# Patient Record
Sex: Female | Born: 1945 | Race: White | Hispanic: No | Marital: Single | Smoking: Never smoker
Health system: Southern US, Community
[De-identification: ages and names within clinical notes are randomized; demographics above are authoritative.]

## PROBLEM LIST (undated history)

## (undated) DIAGNOSIS — F329 Major depressive disorder, single episode, unspecified: Secondary | ICD-10-CM

## (undated) DIAGNOSIS — Q2381 Bicuspid aortic valve: Secondary | ICD-10-CM

## (undated) DIAGNOSIS — IMO0001 Reserved for inherently not codable concepts without codable children: Secondary | ICD-10-CM

## (undated) DIAGNOSIS — I350 Nonrheumatic aortic (valve) stenosis: Secondary | ICD-10-CM

## (undated) DIAGNOSIS — R42 Dizziness and giddiness: Secondary | ICD-10-CM

## (undated) DIAGNOSIS — F419 Anxiety disorder, unspecified: Secondary | ICD-10-CM

## (undated) DIAGNOSIS — F32A Depression, unspecified: Secondary | ICD-10-CM

## (undated) DIAGNOSIS — D72819 Decreased white blood cell count, unspecified: Secondary | ICD-10-CM

## (undated) DIAGNOSIS — D649 Anemia, unspecified: Secondary | ICD-10-CM

## (undated) DIAGNOSIS — R011 Cardiac murmur, unspecified: Secondary | ICD-10-CM

## (undated) DIAGNOSIS — R413 Other amnesia: Secondary | ICD-10-CM

## (undated) DIAGNOSIS — IMO0002 Reserved for concepts with insufficient information to code with codable children: Secondary | ICD-10-CM

## (undated) DIAGNOSIS — R569 Unspecified convulsions: Secondary | ICD-10-CM

## (undated) DIAGNOSIS — M109 Gout, unspecified: Secondary | ICD-10-CM

## (undated) DIAGNOSIS — R87619 Unspecified abnormal cytological findings in specimens from cervix uteri: Secondary | ICD-10-CM

## (undated) DIAGNOSIS — I38 Endocarditis, valve unspecified: Secondary | ICD-10-CM

## (undated) DIAGNOSIS — M8580 Other specified disorders of bone density and structure, unspecified site: Secondary | ICD-10-CM

## (undated) DIAGNOSIS — I2510 Atherosclerotic heart disease of native coronary artery without angina pectoris: Secondary | ICD-10-CM

## (undated) DIAGNOSIS — Q231 Congenital insufficiency of aortic valve: Secondary | ICD-10-CM

## (undated) HISTORY — DX: Cardiac murmur, unspecified: R01.1

## (undated) HISTORY — DX: Gout, unspecified: M10.9

## (undated) HISTORY — DX: Nonrheumatic aortic (valve) stenosis: I35.0

## (undated) HISTORY — DX: Anxiety disorder, unspecified: F41.9

## (undated) HISTORY — DX: Other specified disorders of bone density and structure, unspecified site: M85.80

## (undated) HISTORY — DX: Dizziness and giddiness: R42

## (undated) HISTORY — PX: TUBAL LIGATION: SHX77

## (undated) HISTORY — DX: Unspecified convulsions: R56.9

## (undated) HISTORY — PX: VAGINAL HYSTERECTOMY: SUR661

## (undated) HISTORY — PX: GUM SURGERY: SHX658

## (undated) HISTORY — PX: PARTIAL HYSTERECTOMY: SHX80

## (undated) HISTORY — DX: Reserved for concepts with insufficient information to code with codable children: IMO0002

## (undated) HISTORY — PX: CERVICAL CONE BIOPSY: SUR198

## (undated) HISTORY — DX: Endocarditis, valve unspecified: I38

## (undated) HISTORY — DX: Unspecified abnormal cytological findings in specimens from cervix uteri: R87.619

## (undated) HISTORY — DX: Other amnesia: R41.3

---

## 1998-02-28 ENCOUNTER — Other Ambulatory Visit: Admission: RE | Admit: 1998-02-28 | Discharge: 1998-02-28 | Payer: Self-pay | Admitting: *Deleted

## 1998-05-15 ENCOUNTER — Ambulatory Visit (HOSPITAL_COMMUNITY): Admission: RE | Admit: 1998-05-15 | Discharge: 1998-05-15 | Payer: Self-pay | Admitting: Internal Medicine

## 1999-03-21 ENCOUNTER — Other Ambulatory Visit: Admission: RE | Admit: 1999-03-21 | Discharge: 1999-03-21 | Payer: Self-pay | Admitting: Internal Medicine

## 2000-03-25 ENCOUNTER — Other Ambulatory Visit: Admission: RE | Admit: 2000-03-25 | Discharge: 2000-03-25 | Payer: Self-pay | Admitting: Internal Medicine

## 2000-11-01 ENCOUNTER — Ambulatory Visit (HOSPITAL_COMMUNITY): Admission: RE | Admit: 2000-11-01 | Discharge: 2000-11-01 | Payer: Self-pay | Admitting: Gastroenterology

## 2000-11-01 ENCOUNTER — Encounter (INDEPENDENT_AMBULATORY_CARE_PROVIDER_SITE_OTHER): Payer: Self-pay

## 2001-06-20 ENCOUNTER — Encounter: Payer: Self-pay | Admitting: Pulmonary Disease

## 2001-06-20 ENCOUNTER — Ambulatory Visit (HOSPITAL_COMMUNITY): Admission: RE | Admit: 2001-06-20 | Discharge: 2001-06-20 | Payer: Self-pay | Admitting: Pulmonary Disease

## 2001-07-20 ENCOUNTER — Ambulatory Visit (HOSPITAL_COMMUNITY): Admission: RE | Admit: 2001-07-20 | Discharge: 2001-07-20 | Payer: Self-pay | Admitting: Pulmonary Disease

## 2001-07-20 ENCOUNTER — Encounter: Payer: Self-pay | Admitting: Pulmonary Disease

## 2002-11-17 ENCOUNTER — Other Ambulatory Visit: Admission: RE | Admit: 2002-11-17 | Discharge: 2002-11-17 | Payer: Self-pay | Admitting: Obstetrics and Gynecology

## 2003-06-07 ENCOUNTER — Other Ambulatory Visit: Admission: RE | Admit: 2003-06-07 | Discharge: 2003-06-07 | Payer: Self-pay | Admitting: Obstetrics and Gynecology

## 2003-12-20 ENCOUNTER — Other Ambulatory Visit: Admission: RE | Admit: 2003-12-20 | Discharge: 2003-12-20 | Payer: Self-pay | Admitting: Internal Medicine

## 2004-06-09 ENCOUNTER — Other Ambulatory Visit: Admission: RE | Admit: 2004-06-09 | Discharge: 2004-06-09 | Payer: Self-pay | Admitting: *Deleted

## 2005-06-10 ENCOUNTER — Other Ambulatory Visit: Admission: RE | Admit: 2005-06-10 | Discharge: 2005-06-10 | Payer: Self-pay | Admitting: Obstetrics and Gynecology

## 2006-06-11 ENCOUNTER — Other Ambulatory Visit: Admission: RE | Admit: 2006-06-11 | Discharge: 2006-06-11 | Payer: Self-pay | Admitting: Obstetrics & Gynecology

## 2007-03-16 ENCOUNTER — Ambulatory Visit (HOSPITAL_COMMUNITY): Admission: RE | Admit: 2007-03-16 | Discharge: 2007-03-16 | Payer: Self-pay | Admitting: Obstetrics & Gynecology

## 2007-06-14 ENCOUNTER — Ambulatory Visit (HOSPITAL_BASED_OUTPATIENT_CLINIC_OR_DEPARTMENT_OTHER): Admission: RE | Admit: 2007-06-14 | Discharge: 2007-06-14 | Payer: Self-pay | Admitting: Obstetrics & Gynecology

## 2007-06-14 ENCOUNTER — Encounter: Payer: Self-pay | Admitting: Obstetrics & Gynecology

## 2007-12-30 ENCOUNTER — Other Ambulatory Visit: Admission: RE | Admit: 2007-12-30 | Discharge: 2007-12-30 | Payer: Self-pay | Admitting: Obstetrics & Gynecology

## 2008-09-14 HISTORY — PX: COLONOSCOPY: SHX174

## 2009-01-21 ENCOUNTER — Other Ambulatory Visit: Admission: RE | Admit: 2009-01-21 | Discharge: 2009-01-21 | Payer: Self-pay | Admitting: Obstetrics & Gynecology

## 2010-11-03 ENCOUNTER — Other Ambulatory Visit: Payer: Self-pay | Admitting: Oncology

## 2010-11-03 ENCOUNTER — Encounter (HOSPITAL_BASED_OUTPATIENT_CLINIC_OR_DEPARTMENT_OTHER): Payer: BC Managed Care – PPO | Admitting: Oncology

## 2010-11-03 DIAGNOSIS — D72819 Decreased white blood cell count, unspecified: Secondary | ICD-10-CM

## 2010-11-03 LAB — COMPREHENSIVE METABOLIC PANEL
ALT: 20 U/L (ref 0–35)
AST: 32 U/L (ref 0–37)
Albumin: 3.9 g/dL (ref 3.5–5.2)
Alkaline Phosphatase: 82 U/L (ref 39–117)
BUN: 12 mg/dL (ref 6–23)
CO2: 29 mEq/L (ref 19–32)
Calcium: 9.1 mg/dL (ref 8.4–10.5)
Chloride: 102 mEq/L (ref 96–112)
Creatinine, Ser: 0.71 mg/dL (ref 0.40–1.20)
Glucose, Bld: 84 mg/dL (ref 70–99)
Potassium: 3.7 mEq/L (ref 3.5–5.3)
Sodium: 139 mEq/L (ref 135–145)
Total Bilirubin: 0.5 mg/dL (ref 0.3–1.2)
Total Protein: 7.4 g/dL (ref 6.0–8.3)

## 2010-11-03 LAB — CBC WITH DIFFERENTIAL/PLATELET
BASO%: 0.6 % (ref 0.0–2.0)
Basophils Absolute: 0 10*3/uL (ref 0.0–0.1)
EOS%: 1.2 % (ref 0.0–7.0)
Eosinophils Absolute: 0 10*3/uL (ref 0.0–0.5)
HCT: 36 % (ref 34.8–46.6)
HGB: 12.3 g/dL (ref 11.6–15.9)
LYMPH%: 34.8 % (ref 14.0–49.7)
MCH: 32.2 pg (ref 25.1–34.0)
MCHC: 34.1 g/dL (ref 31.5–36.0)
MCV: 94.2 fL (ref 79.5–101.0)
MONO#: 0.3 10*3/uL (ref 0.1–0.9)
MONO%: 7.6 % (ref 0.0–14.0)
NEUT#: 2.1 10*3/uL (ref 1.5–6.5)
NEUT%: 55.8 % (ref 38.4–76.8)
Platelets: 179 10*3/uL (ref 145–400)
RBC: 3.83 10*6/uL (ref 3.70–5.45)
RDW: 12.5 % (ref 11.2–14.5)
WBC: 3.8 10*3/uL — ABNORMAL LOW (ref 3.9–10.3)
lymph#: 1.3 10*3/uL (ref 0.9–3.3)

## 2010-11-03 LAB — CHCC SMEAR

## 2010-11-03 LAB — MORPHOLOGY
PLT EST: ADEQUATE
RBC Comments: NORMAL

## 2010-11-04 LAB — ANA: Anti Nuclear Antibody(ANA): NEGATIVE

## 2010-11-04 LAB — HIV ANTIBODY (ROUTINE TESTING W REFLEX): HIV: NONREACTIVE

## 2011-01-27 NOTE — Op Note (Signed)
Nicole Hess, Nicole Hess              ACCOUNT NO.:  [id number]   MEDICAL RECORD NO.:  [id number]          PATIENT TYPE:  AMB   LOCATION:  [facility]                         FACILITY:  [facility]   PHYSICIAN:  M. Leda Quail, MD  DATE OF BIRTH:  23-Feb-1946   DATE OF PROCEDURE:  DATE OF DISCHARGE:                               OPERATIVE REPORT   PREOPERATIVE DIAGNOSES:  29. 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  2. Left lower quadrant pain.  3. History of seizure disorder.  4. 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   POSTOPERATIVE DIAGNOSES:  6. 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  2. Left lower quadrant pain.  3. History of seizure disorder.  4. 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  5. Dense and filmy adhesions of ovaries and fallopian tubes      bilaterally.  6. Laparoscopic bilateral salpingo-oophorectomy with lysis of      adhesions for approximately 45 minutes.   SURGEON:  M. Leda Quail, MD   ASSISTANT:  Edwena Felty. Romine, M.D.   ANESTHESIA:  General.   FINDINGS:  Bilateral dilated fallopian tubes, consistent with  hydrosalpinx, atrophic ovaries, and dense and filmy adhesions of the  tubes and ovaries, and the pelvic sidewalls bilaterally.   SPECIMENS:  Bilateral tubes and ovaries.   DISPOSITION:  Specimen to pathology.   ESTIMATED BLOOD LOSS:  50 mL.   FLUIDS:  2000 mL of LR.   URINE OUTPUT:  4475 mL.   DRAINS:  Foley catheter.   INDICATIONS:  Nicole Hess  is a very nice 65 year old white  female who has a history of bilateral hydrosalpinx.  This was noted on  CAT scan which was initially performed due to unexplained weight loss.  The patient underwent a pelvic ultrasound which showed 5 cm hydrosalpinx  bilaterally.  CA-125 was negative, and we  decided to follow this  conservatively.  In the interim she has developed some persistent left  lower quadrant pain.  Repeat ultrasound showed a 10-cm and 9 cm  hydrosalpinx.  Because they were enlarging in size, and she was now  having some pain issues we decided to go ahead and proceed with  laparoscopic BSO.  The patient has been consented for this procedure.  The risks and benefits have explained in the clinic.  These were all  documented on her chart.  The patient was ready to proceed.   DESCRIPTION OF PROCEDURE:  The patient taken to the operating room.  She  is placed in the supine position.  General endotracheal anesthesia is  administered by the anesthesia staff without difficulty.  Legs  positioned in the Alma stirrups in the low lithotomy position.  Abdomen, perineum, inner thighs, and vagina are prepped in normal  sterile fashion.  A Foley catheter was inserted in the bladder under  sterile conditions.  The patient is then draped in a normal sterile  fashion.  A sponge stick was placed in the vagina.   Attention is turned to the umbilicus.  Then 5 mL of 1/4% Marcaine  without epinephrine are instilled in the umbilicus.  A 10-mm skin  incision is made with the #11 blade.  The subcu fat tissue is dissected  with the hemostat.  The sacral promontory was palpated.  The abdominal  wall layer is elevated, and the Veress needle was aimed toward the  pelvis and placed through the abdominal wall layers.  Using a syringe,  no aspiration of fluid was noted.  Fluid dripped easily in and no  aspiration of fluid or blood was noted with a second aspiration.  A drip  test showed fluid dripping easily into the Veress needle.   Then the Veress needle was attached to a CO2 gas under low a flow.  What  was felt to be a pneumoperitoneum was achieved with low pressures all  under 15 mmHg.  Two liters of gas was instilled intraperitoneally.  Then  a nonbladed trocar with OptiVu tip was obtained.   Using a twisting  motion, the abdominal wall layers were traversed without difficulty.  Unfortunately, I could not pierce through the peritoneum with the OptiVu  tip even with it hubbed at the patient's abdomen.  This was removed and  an attempt was made, again, to pass a Veress needle.  By this point, all  the gas had escaped from the abdomen.  This was felt, again, to be in  its appropriate place, and a pneumoperitoneum, again, was achieved with  low pressures.   A second attempt was made to pass the midline trocar with the OptiVu  port.  The peritoneal layer, again, could not be traversed.  Decision  was made to go ahead, and proceed with this procedure using an open  technique.  The fascia was grasped on either side with Kocher clamps  that had already been traversed, and so it was visible.  The peritoneum  was then identified, elevated, and incised sharply.  The gas escaped  easily which did confirm that we had a pneumoperitoneum, but because of  the OptiVu port, and the nonbladed trocar in the midline, I could not  pass the tip through the peritoneum.   A Roseanne Reno was obtained and passed through this opening after the fascia  was stitched on either side.  The stitches were attached to the [place];  CO2 gas was then attached to the Campbell, and a pneumoperitoneum was  easily achieved.  The patient was placed in Trendelenburg, and  laparoscope was used to visualize the pelvis.  Bilateral hydrosalpinx  were noted; however, they were adhered to the vaginal cuff, and somewhat  to the sidewalls through dense and filmy adhesions.  Ureters were  identified bilaterally.   Attention was first turned to the left side.  The left hydrosalpinx was  freed using sharp dissection from the sidewall.  Cautery was used as  necessary.  At least 30 minutes was necessary to free the left fallopian  tube from the sidewall.  Again, care was taken to note the location of  the ureter throughout this process.   Once the ovary and fallopian tube  had been freed from the sidewall and then to the vagina, two Endoloops  were placed around the IP ligament on the left side.  These were cinched  tightly and cut.  Then the left fallopian tube and ovary were freed from  the IP ligament.  These were placed in  the pelvis.  No bleeding was  noted on this side.   Attention was turned to the right side.  In a similar fashion the left  fallopian tube was freed from the pelvic sidewall using sharp dissection  as necessary.  Bipolar cautery was used as necessary as well to achieve  hemostasis through the dense adhesions.  About 15-20 minutes was  necessary to free this side from the pelvic sidewall.  Once this had  occurred, two Endoloops were placed around the right IP ligament.  These  Endoloops were cinched tightly.  In placing the second Endoloop a piece  of bowel epiploica was caught in the loop.  This could not be freed  without fear of tearing the bowel.  Therefore, the loop was cut, and the  knot was left on the epiploica.  This section of bowel was visualized  multiple times to ensure that actually no bowel tissue was obtained into  the loop and this was confirmed.   A second Endoloop was then placed on the right IP ligament and cinched  tightly.  This pedicle was then transected, and the right fallopian tube  and ovary were freed up.  An attempt was made to place all specimens in  an endoscopic bag, but was unsuccessful.  Therefore, the hydrosalpinx  were drained by incising them and then pulling all specimens out through  the midline port.  Once this was performed, then a Nezhat suction  irrigator was used to irrigate the IP ligaments bilaterally.  There was  a small amount of bleeding from the right pedicle or an area where the  vessels had come out of the pedicle.  The ureter was well identified.  Using bipolar cautery, this area of bleeding was grasped and cauterized  until the indicator  indicated appropriate cautery.   This side was watched for several minutes to ensure that there was no  hematoma formation and to ensure excellent hemostasis.  There was a  small amount of bleeding on the left peritoneal edge, on the pelvic  sidewall.  This was grasped with bipolar cautery pulled way from the  sidewall, and cauterized.  Then the Nezhat suction irrigator, again, was  used to ensure no bleeding was noted and to irrigate both the sidewalls.   At this point, we were comfortable with no bleeding.  The patient was  taken out the Trendelenburg slightly, and tipped to the left side so  that the right side could be well visualized, particularly the right IP  ligament once the pneumoperitoneum was released.  The pneumoperitoneum  was released slowly, and no bleeding was noted.  Pneumoperitoneum was  achieved, again, to revisualize that side; and, again, no hematoma  formation was noted, and excellent hemostasis was present.  At this  point the procedure and was ended.  Right and left lower quadrant ports  were removed under direct visualization of the laparoscope.  The  laparoscope was then removed, and the pneumoperitoneum was completely  relieved.  Anesthesia gave the patient several deep breaths and an  attempt was made to remove all the gas from the abdomen.   At this point the Roseanne Reno was removed.  The fascial stitches were tied  tightly to bring the fascia back together in the midline.  The  operator's index finger was placed in this incision to ensure no bowel  was in the midline incision before the sutures were tied.  The skin was  then closed in the midline with a subcuticular  stitch of 4-0 Vicryl, and  the two inferior incisions were closed with Dermabond.  The midline  incision was cleansed, dried, and also made air tight with Dermabond.   Sponge, lap, needle, and instrument counts were correct x2.  The sponge  stick was taken out of the vagina, and the Foley was  removed without  difficulty.  The legs were positioned back in the supine position.  The  patient was awakened from anesthesia without difficulty.  She was taken  to the recovery room in stable condition.      Lum Keas, MD  Electronically Signed     MSM/MEDQ  D:  06/14/2007  T:  06/14/2007  Job:  [id number]

## 2011-03-02 ENCOUNTER — Encounter (HOSPITAL_BASED_OUTPATIENT_CLINIC_OR_DEPARTMENT_OTHER): Payer: BC Managed Care – PPO | Admitting: Oncology

## 2011-03-02 DIAGNOSIS — D72819 Decreased white blood cell count, unspecified: Secondary | ICD-10-CM

## 2011-03-04 ENCOUNTER — Encounter: Payer: BC Managed Care – PPO | Admitting: Oncology

## 2011-03-04 ENCOUNTER — Other Ambulatory Visit: Payer: Self-pay | Admitting: Oncology

## 2011-03-04 LAB — CBC WITH DIFFERENTIAL/PLATELET
BASO%: 0.6 % (ref 0.0–2.0)
EOS%: 1.6 % (ref 0.0–7.0)
HGB: 13 g/dL (ref 11.6–15.9)
MCH: 32.4 pg (ref 25.1–34.0)
MCHC: 34.5 g/dL (ref 31.5–36.0)
MONO%: 9.1 % (ref 0.0–14.0)
RBC: 4.03 10*6/uL (ref 3.70–5.45)
RDW: 12.8 % (ref 11.2–14.5)
lymph#: 1.6 10*3/uL (ref 0.9–3.3)

## 2011-06-25 LAB — COMPREHENSIVE METABOLIC PANEL
ALT: 26
AST: 38 — ABNORMAL HIGH
CO2: 30
Chloride: 102
GFR calc Af Amer: >60
GFR calc non Af Amer: >60
Glucose, Bld: 75
Sodium: 142
Total Bilirubin: 0.4

## 2011-06-25 LAB — URINALYSIS, ROUTINE W REFLEX MICROSCOPIC
Bilirubin Urine: NEGATIVE
Ketones, ur: NEGATIVE
Nitrite: NEGATIVE
Protein, ur: NEGATIVE
Urobilinogen, UA: 0.2

## 2011-06-25 LAB — CBC
Hemoglobin: 12.5
MCV: 90.5
RBC: 4.07
WBC: 3.5 — ABNORMAL LOW

## 2011-07-01 ENCOUNTER — Other Ambulatory Visit: Payer: Self-pay | Admitting: Oncology

## 2011-07-01 ENCOUNTER — Encounter (HOSPITAL_BASED_OUTPATIENT_CLINIC_OR_DEPARTMENT_OTHER): Payer: BC Managed Care – PPO | Admitting: Oncology

## 2011-07-01 DIAGNOSIS — D72819 Decreased white blood cell count, unspecified: Secondary | ICD-10-CM

## 2011-07-01 LAB — CBC WITH DIFFERENTIAL/PLATELET
Basophils Absolute: 0 10*3/uL (ref 0.0–0.1)
EOS%: 1.9 % (ref 0.0–7.0)
HCT: 38 % (ref 34.8–46.6)
HGB: 13 g/dL (ref 11.6–15.9)
MCH: 31.8 pg (ref 25.1–34.0)
MCV: 92.9 fL (ref 79.5–101.0)
NEUT%: 49 % (ref 38.4–76.8)
Platelets: 148 10*3/uL (ref 145–400)
lymph#: 1.3 10*3/uL (ref 0.9–3.3)

## 2011-07-01 LAB — COMPREHENSIVE METABOLIC PANEL
ALT: 25 U/L (ref 0–35)
Alkaline Phosphatase: 95 U/L (ref 39–117)
Creatinine, Ser: 0.8 mg/dL (ref 0.50–1.10)
Sodium: 140 mEq/L (ref 135–145)
Total Bilirubin: 0.3 mg/dL (ref 0.3–1.2)
Total Protein: 7.4 g/dL (ref 6.0–8.3)

## 2011-07-01 LAB — MORPHOLOGY: RBC Comments: NORMAL

## 2011-07-01 LAB — CHCC SMEAR

## 2011-08-07 ENCOUNTER — Telehealth: Payer: Self-pay | Admitting: *Deleted

## 2011-08-07 NOTE — Telephone Encounter (Signed)
Pt called for advice on cold symptoms, c/o congestion and asks "what should I do?"   Instructed pt to call PCP if she develops any fever or productive cough or symptoms persist.  Suggested OTC cold remedies for symptom management and directed her again to her PCP if any worsening or persistent symptoms..  Pt states right now feels like it might be "allergies."  She verbalized understanding.

## 2011-08-27 ENCOUNTER — Telehealth: Payer: Self-pay | Admitting: *Deleted

## 2011-08-27 ENCOUNTER — Other Ambulatory Visit: Payer: Self-pay | Admitting: *Deleted

## 2011-08-27 NOTE — Telephone Encounter (Signed)
Pt called to schedule her pending lab appts for 09/28/11 and 12/28/11 at 8 am.  Sent POF to scheduling and instructed her scheduling should be calling her to confirm d/t of labs.  She verbalized understanding.

## 2011-08-31 ENCOUNTER — Telehealth: Payer: Self-pay | Admitting: Oncology

## 2011-08-31 NOTE — Telephone Encounter (Signed)
lmonvm advising the pt of her lab appt in jan and to pick up her April 2013 appt calendar at that time

## 2011-09-02 ENCOUNTER — Other Ambulatory Visit: Payer: Self-pay | Admitting: *Deleted

## 2011-09-02 DIAGNOSIS — D72819 Decreased white blood cell count, unspecified: Secondary | ICD-10-CM

## 2011-09-28 ENCOUNTER — Other Ambulatory Visit (HOSPITAL_BASED_OUTPATIENT_CLINIC_OR_DEPARTMENT_OTHER): Payer: BC Managed Care – PPO | Admitting: Lab

## 2011-09-28 ENCOUNTER — Telehealth: Payer: Self-pay | Admitting: *Deleted

## 2011-09-28 ENCOUNTER — Other Ambulatory Visit: Payer: Self-pay | Admitting: Oncology

## 2011-09-28 ENCOUNTER — Other Ambulatory Visit: Payer: Self-pay | Admitting: *Deleted

## 2011-09-28 DIAGNOSIS — D72819 Decreased white blood cell count, unspecified: Secondary | ICD-10-CM

## 2011-09-28 LAB — CBC WITH DIFFERENTIAL/PLATELET
EOS%: 1.4 % (ref 0.0–7.0)
Eosinophils Absolute: 0.1 10*3/uL (ref 0.0–0.5)
HGB: 12.5 g/dL (ref 11.6–15.9)
MCV: 93.4 fL (ref 79.5–101.0)
MONO%: 10.5 % (ref 0.0–14.0)
NEUT#: 2.2 10*3/uL (ref 1.5–6.5)
RBC: 3.93 10*6/uL (ref 3.70–5.45)
RDW: 13.8 % (ref 11.2–14.5)
lymph#: 1.3 10*3/uL (ref 0.9–3.3)

## 2011-09-28 LAB — CHCC SMEAR

## 2011-09-28 NOTE — Telephone Encounter (Signed)
Message copied by Wende Mott on Mon Sep 28, 2011  3:55 PM ------      Message from: HA, Raliegh Ip T      Created: Mon Sep 28, 2011  9:00 AM       Please call patient.  Her CBC is relatively stable.  Her plt is only slightly low.  Will continue observation, and monitor her CBC for now.  Thanks.

## 2011-09-28 NOTE — Telephone Encounter (Signed)
Message copied by Wende Mott on Mon Sep 28, 2011 11:05 AM ------      Message from: HA, Raliegh Ip T      Created: Mon Sep 28, 2011  9:00 AM       Please call patient.  Her CBC is relatively stable.  Her plt is only slightly low.  Will continue observation, and monitor her CBC for now.  Thanks.

## 2011-09-28 NOTE — Telephone Encounter (Signed)
Called pt and relayed Dr. Lodema Pilot message that her CBC is relatively stable, w/ slightly low platelets.  Instructed to keep next lab appt in April as scheduled. Pt verbalized understanding.

## 2011-12-28 ENCOUNTER — Other Ambulatory Visit: Payer: BC Managed Care – PPO | Admitting: Lab

## 2011-12-28 ENCOUNTER — Other Ambulatory Visit: Payer: Self-pay | Admitting: Oncology

## 2011-12-28 DIAGNOSIS — D72819 Decreased white blood cell count, unspecified: Secondary | ICD-10-CM

## 2011-12-28 LAB — CBC WITH DIFFERENTIAL/PLATELET
BASO%: 0.5 % (ref 0.0–2.0)
Eosinophils Absolute: 0.1 10*3/uL (ref 0.0–0.5)
HCT: 39.7 % (ref 34.8–46.6)
MCHC: 33.6 g/dL (ref 31.5–36.0)
MONO#: 0.4 10*3/uL (ref 0.1–0.9)
NEUT#: 1.6 10*3/uL (ref 1.5–6.5)
RBC: 4.14 10*6/uL (ref 3.70–5.45)
WBC: 3.3 10*3/uL — ABNORMAL LOW (ref 3.9–10.3)
lymph#: 1.3 10*3/uL (ref 0.9–3.3)
nRBC: 0 % (ref 0–0)

## 2012-02-07 ENCOUNTER — Encounter (HOSPITAL_COMMUNITY): Payer: Self-pay | Admitting: Emergency Medicine

## 2012-02-07 ENCOUNTER — Encounter: Payer: Self-pay | Admitting: *Deleted

## 2012-02-07 ENCOUNTER — Emergency Department (HOSPITAL_COMMUNITY)
Admission: EM | Admit: 2012-02-07 | Discharge: 2012-02-07 | Disposition: A | Discharge status: Home / Self Care | Payer: BC Managed Care – PPO | Attending: Emergency Medicine | Admitting: Emergency Medicine

## 2012-02-07 ENCOUNTER — Emergency Department (HOSPITAL_COMMUNITY): Payer: BC Managed Care – PPO

## 2012-02-07 DIAGNOSIS — S99929A Unspecified injury of unspecified foot, initial encounter: Secondary | ICD-10-CM | POA: Insufficient documentation

## 2012-02-07 DIAGNOSIS — M25569 Pain in unspecified knee: Secondary | ICD-10-CM | POA: Insufficient documentation

## 2012-02-07 DIAGNOSIS — W010XXA Fall on same level from slipping, tripping and stumbling without subsequent striking against object, initial encounter: Secondary | ICD-10-CM | POA: Insufficient documentation

## 2012-02-07 DIAGNOSIS — M25469 Effusion, unspecified knee: Secondary | ICD-10-CM | POA: Insufficient documentation

## 2012-02-07 DIAGNOSIS — S8000XA Contusion of unspecified knee, initial encounter: Secondary | ICD-10-CM | POA: Insufficient documentation

## 2012-02-07 DIAGNOSIS — S8990XA Unspecified injury of unspecified lower leg, initial encounter: Secondary | ICD-10-CM | POA: Insufficient documentation

## 2012-02-07 DIAGNOSIS — Z79899 Other long term (current) drug therapy: Secondary | ICD-10-CM | POA: Insufficient documentation

## 2012-02-07 DIAGNOSIS — W19XXXA Unspecified fall, initial encounter: Secondary | ICD-10-CM

## 2012-02-07 DIAGNOSIS — S8992XA Unspecified injury of left lower leg, initial encounter: Secondary | ICD-10-CM

## 2012-02-07 MED ORDER — TRAMADOL HCL 50 MG PO TABS: 50.0000 mg | ORAL_TABLET | Freq: Four times a day (QID) | ORAL | Status: AC | PRN

## 2012-02-07 NOTE — Discharge Instructions (Signed)
You have been evaluated for your knee injury. X-ray shows no acute fractures or dislocation. There is moderate swelling noted to your left knee. Use knee sleeves for support. Use crutches as needed to move around for the next 5-7 days. Follows instructions below. Do not drive or operate heavy machinery while taking pain medication.  Knee Wraps (Elastic Bandage) and RICE Knee wraps come in many different shapes and sizes and perform many different functions. Some wraps may provide cold therapy or warmth. Your caregiver will help you to determine what is best for your protection, or recovery following your injury. The following are some general tips to help you use a knee wrap:  Use the wrap as directed.   Do not keep the wrap so tight that it cuts off the circulation of the leg below the wrap.   If your lower leg becomes blue, loses feeling, or becomes swollen below the wrap, it is probably too tight. Loosen the wrap as needed to improve these problems.   See your caregiver or trainer if the wrap seems to be making your problems worse rather than better.  Wraps in general help to remind you that you have an injury. They provide limited support. The few pounds of support they provide are minimal considering the hundreds of pounds of pressure it takes to injure a joint or tear ligaments.  The routine care of many injuries includes Rest, Ice, Compression, and Elevation (RICE).  Rest is required to allow your body to heal. Generally following bumps and bruises, routine activities can be resumed when comfortable. Injured tendons (cord-like structures that attach muscle to bone) and bones take approximately 6 to 12 weeks to heal.   Ice following an injury helps keep the swelling down and reduces pain. Do not apply ice directly to skin. Apply ice bags for 20-30 minutes every 3-4 hours for the first 2-3 days following injury or surgery. Place ice in a plastic bag with a towel around it.   Compression helps  keep swelling down, gives support, and helps with discomfort. If a knee wrap has been applied, it should be removed and reapplied every 3 to 4 hours. It should be applied firmly enough to keep swelling down, but not too tightly. Watch your lower leg and toes for swelling, bluish discoloration, coldness, numbness or excessive pain. If any of these symptoms (problems) occur, remove the knee wrap and reapply more loosely. If these symptoms persist, contact your caregiver immediately.   Elevation helps reduce swelling, and decreases pain. With extremities (arms/hands and legs/feet), the injured area should be placed near to or above the level of the heart if possible.  Persistent pain and inability to use the injured area for more than 2 to 3 days are warning signs indicating that you should see a caregiver for a follow-up visit as soon as possible. Initially, a hairline fracture (this is the same as a broken bone) may not be seen on x-rays.  Persistent pain and swelling mean limitedweight bearing (use of crutches as instructed) should continue. You may need further x-rays.  X-rays may not show a non-displaced fracture until a week or ten days later. Make a follow-up appointment with your caregiver. A radiologist (a specialist in reading x-rays) will re-read your x-rays. Make sure you know how to get your x-ray results. Do not assume everything is normal if you do not hear from your caregiver. MAKE SURE YOU:   Understand these instructions.   Will watch your condition.  Will get help right away if you are not doing well or get worse.  Document Released: 02/20/2002 Document Revised: 08/20/2011 Document Reviewed: 12/20/2008 [facility] Patient Information 2012 [place], [place].  Knee Effusion The medical term for having fluid in your knee is effusion. This is often due to an internal derangement of the knee. This means something is wrong inside the knee. Some of the causes of fluid in the knee may be torn  cartilage, a torn ligament, or bleeding into the joint from an injury. Your knee is likely more difficult to bend and move. This is often because there is increased pain and pressure in the joint. The time it takes for recovery from a knee effusion depends on different factors, including:   Type of injury.   Your age.   Physical and medical conditions.   Rehabilitation Strategies.  How long you will be away from your normal activities will depend on what kind of knee problem you have and how much damage is present. Your knee has two types of cartilage. Articular cartilage covers the bone ends and lets your knee bend and move smoothly. Two menisci, thick pads of cartilage that form a rim inside the joint, help absorb shock and stabilize your knee. Ligaments bind the bones together and support your knee joint. Muscles move the joint, help support your knee, and take stress off the joint itself. CAUSES  Often an effusion in the knee is caused by an injury to one of the menisci. This is often a tear in the cartilage. Recovery after a meniscus injury depends on how much meniscus is damaged and whether you have damaged other knee tissue. Small tears may heal on their own with conservative treatment. Conservative means rest, limited weight bearing activity and muscle strengthening exercises. Your recovery may take up to 6 weeks.  TREATMENT  Larger tears may require surgery. Meniscus injuries may be treated during arthroscopy. Arthroscopy is a procedure in which your surgeon uses a small telescope like instrument to look in your knee. Your caregiver can make a more accurate diagnosis (learning what is wrong) by performing an arthroscopic procedure. If your injury is on the inner margin of the meniscus, your surgeon may trim the meniscus back to a smooth rim. In other cases your surgeon will try to repair a damaged meniscus with stitches (sutures). This may make rehabilitation take longer, but may provide  better long term result by helping your knee keep its shock absorption capabilities. Ligaments which are completely torn usually require surgery for repair. HOME CARE INSTRUCTIONS  Use crutches as instructed.   If a brace is applied, use as directed.   Once you are home, an ice pack applied to your swollen knee may help with discomfort and help decrease swelling.   Keep your knee raised (elevated) when you are not up and around or on crutches.   Only take over-the-counter or prescription medicines for pain, discomfort, or fever as directed by your caregiver.   Your caregivers will help with instructions for rehabilitation of your knee. This often includes strengthening exercises.   You may resume a normal diet and activities as directed.  SEEK MEDICAL CARE IF:   There is increased swelling in your knee.   You notice redness, swelling, or increasing pain in your knee.   An unexplained oral temperature above 102 F (38.9 C) develops.  SEEK IMMEDIATE MEDICAL CARE IF:   You develop a rash.   You have difficulty breathing.   You have any  allergic reactions from medications you may have been given.   There is severe pain with any motion of the knee.  MAKE SURE YOU:   Understand these instructions.   Will watch your condition.   Will get help right away if you are not doing well or get worse.  Document Released: 11/21/2003 Document Revised: 08/20/2011 Document Reviewed: 01/25/2008 [facility] Patient Information 2012 [place], [place].

## 2012-02-07 NOTE — ED Provider Notes (Signed)
History     CSN: [id number]  Arrival date & time 02/07/12  1749   First MD Initiated Contact with Patient 02/07/12 1941      Chief Complaint  Patient presents with  . Knee Pain    (Consider location/radiation/quality/duration/timing/severity/associated sxs/prior treatment) HPI  66 year old female presents with a chief complaints of fall. The [place]pped over an uneven surface and fell forward.  Patient states both of knees made contact with the ground.  She denies hitting head or LOC.  Denies any other injury.  Able to ambulate afterward but sts pain is increasing L>R.  Notice increase swelling to L knee. Describe as throbbing and burning, worsening with movement. Denies taking anticoagulant.  UTD with tetanus.  Denies precipitating sxs prior to fall.    Past Medical History  Diagnosis Date  . Seizures     as a child  . Mitral insufficiency   . Heart murmur     Past Surgical History  Procedure Date  . Oophorectomy   . Partial hysterectomy   . Gum surgery     Family History  Problem Relation Age of Onset  . Multiple myeloma Mother   . Stroke Father   . Hypertension Father   . Heart disease Father   . Kidney disease Father   . Cancer Father     History  Substance Use Topics  . Smoking status: Never Smoker   . Smokeless tobacco: Not on file  . Alcohol Use: 4.2 oz/week    7 Glasses of wine per week    OB History    Grav Para Term Preterm Abortions TAB SAB Ect Mult Living                  Review of Systems  Constitutional: Negative for fever and fatigue.  Musculoskeletal: Negative for gait problem.  Neurological: Negative for numbness.    Allergies  Penicillins  Home Medications   Current Outpatient Rx  Name Route Sig Dispense Refill  . CALTRATE 600+D PO Oral Take 1 tablet by mouth 2 (two) times daily.    Marland Kitchen VIVELLE-DOT TD Transdermal Place 0.5 patches onto the skin 2 (two) times a week.    .  ADULT MULTIVITAMIN W/MINERALS CH Oral Take 1 tablet by mouth daily.    Marland Kitchen PRAVASTATIN SODIUM 80 MG PO TABS Oral Take 80 mg by mouth daily.    Marland Kitchen PRIMIDONE 250 MG PO TABS Oral Take 250 mg by mouth 2 (two) times daily.    Marland Kitchen TEMAZEPAM 30 MG PO CAPS Oral Take 30 mg by mouth at bedtime as needed. For sleep    . VITAMIN B-12 50 MCG PO TABS Oral Take 50 mcg by mouth daily.      BP 125/60  Pulse 85  Temp(Src) 98.6 F (37 C) (Oral)  SpO2 100%  Physical Exam  Nursing note and vitals reviewed. Constitutional: She appears well-developed and well-nourished. No distress.  HENT:  Head: Normocephalic and atraumatic.  Eyes: Conjunctivae are normal.  Neck: Normal range of motion. Neck supple.  Musculoskeletal:       Right hip: Normal.       Left hip: Normal.       Right knee: She exhibits swelling and ecchymosis. She exhibits normal range of motion, no effusion, no deformity and no laceration.       Left knee: She exhibits decreased range of motion, swelling, effusion and ecchymosis. She exhibits no deformity, no laceration and normal  alignment. tenderness found.       Right ankle: Normal.       Left ankle: Normal.       Legs: Neurological: She is alert.  Skin: Skin is warm.    ED Course  Procedures (including critical care time)  Labs Reviewed - No data to display No results found.   No diagnosis found.  Dg Knee Complete 4 Views Left  02/07/2012  *RADIOLOGY REPORT*  Clinical Data: Fall  LEFT KNEE - COMPLETE 4+ VIEW  Comparison: None.  Findings: No acute fracture and no dislocation.  Osteopenia. Moderate joint effusion.  Mild narrowing of the medial compartment.  IMPRESSION: No acute bony pathology.  Mild degenerative change.  Osteopenia. Moderate joint effusion.  Original Report Authenticated By: Donavan Burnet, M.D.   Dg Knee Complete 4 Views Right  02/07/2012  *RADIOLOGY REPORT*  Clinical Data: Injury  RIGHT KNEE - COMPLETE 4+ VIEW  Comparison: None.  Findings: No acute fracture and no  dislocation.  Osteopenia. Minimal degenerative change.  IMPRESSION: No acute bony pathology.  Original Report Authenticated By: Donavan Burnet, M.D.      MDM  Mechanical fall causing bilateral knee injury.  Abrasion noted to anterior aspect of both knee.  Moderate edema noted to L knee.  Decreased ROM due to pain.  Able to ambulate.  WIll xray both knees.     9:03 PM Xray shows no acute fracture or dislocation. Moderate knee effusion noted to left knee. Patient has abrasion to both knees. Bacitracin and dressing applied. The knee sleeves applied. Crutches given with instruction. Will DC with care instructions discussed. Patient voiced understanding and agrees with plan.     Fayrene Helper, PA-C 02/07/12 2104

## 2012-02-13 NOTE — ED Provider Notes (Signed)
Medical screening examination/treatment/procedure(s) were performed by non-physician practitioner and as supervising physician I was immediately available for consultation/collaboration.  Raeford Razor, MD 02/13/12 [phone]

## 2012-03-30 ENCOUNTER — Other Ambulatory Visit (HOSPITAL_BASED_OUTPATIENT_CLINIC_OR_DEPARTMENT_OTHER): Payer: BC Managed Care – PPO | Admitting: Lab

## 2012-03-30 ENCOUNTER — Ambulatory Visit (HOSPITAL_BASED_OUTPATIENT_CLINIC_OR_DEPARTMENT_OTHER): Payer: BC Managed Care – PPO | Admitting: Oncology

## 2012-03-30 VITALS — BP 129/71 | HR 78 | Ht 68.0 in | Wt 156.3 lb

## 2012-03-30 DIAGNOSIS — E785 Hyperlipidemia, unspecified: Secondary | ICD-10-CM | POA: Insufficient documentation

## 2012-03-30 DIAGNOSIS — D72819 Decreased white blood cell count, unspecified: Secondary | ICD-10-CM | POA: Insufficient documentation

## 2012-03-30 DIAGNOSIS — D696 Thrombocytopenia, unspecified: Secondary | ICD-10-CM

## 2012-03-30 DIAGNOSIS — F329 Major depressive disorder, single episode, unspecified: Secondary | ICD-10-CM

## 2012-03-30 LAB — COMPREHENSIVE METABOLIC PANEL
ALT: 24 U/L (ref 0–35)
BUN: 23 mg/dL (ref 6–23)
CO2: 26 mEq/L (ref 19–32)
Calcium: 9 mg/dL (ref 8.4–10.5)
Chloride: 103 mEq/L (ref 96–112)
Creatinine, Ser: 0.91 mg/dL (ref 0.50–1.10)

## 2012-03-30 LAB — CBC WITH DIFFERENTIAL/PLATELET
BASO%: 0.6 % (ref 0.0–2.0)
LYMPH%: 44.7 % (ref 14.0–49.7)
MCHC: 33.5 g/dL (ref 31.5–36.0)
MCV: 94.8 fL (ref 79.5–101.0)
MONO%: 7.7 % (ref 0.0–14.0)
Platelets: 165 10*3/uL (ref 145–400)
RBC: 4.01 10*6/uL (ref 3.70–5.45)

## 2012-03-30 LAB — CHCC SMEAR

## 2012-03-30 NOTE — Patient Instructions (Addendum)
1.  Issue:  Low white count (leukopenia) but normal neutrophil (bacteria-fighting white blood cells).  Most likely benign.  This has been ongoing since 05/2007.  I have very low suspicion for leukepenia, or bone marrow failure at this time.   2.  Recommendation:  Discharged from [facility].  Please follow up with your primary care physician.  Please have your blood count checked about twice a year.  In the future, if your neutrophil <1; Hemoglobin <10; or Platelet <100, we can further evaluate again.      Thank you.

## 2012-03-30 NOTE — Progress Notes (Signed)
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   CURRENT THERAPY:  Watchful observation.   INTERVAL HISTORY: Nicole Hess 66 y.o. female returns for regular follow up.  She reports feeling well.  Her diet now has been more diversified compared to before.  She still works full time and works out regularly.  She has good appetite and stable weight.  She denied fever, night sweat, fatigue, focal source of infection, bleeding, skin rash, bone pain.    Past Medical History  Diagnosis Date  . Seizures     as a child  . Mitral insufficiency   . Heart murmur     Past Surgical History  Procedure Date  . Oophorectomy   . Partial hysterectomy   . Gum surgery     Current Outpatient Prescriptions  Medication Sig Dispense Refill  . b complex vitamins tablet Take 1 tablet by mouth daily.      . Calcium Carbonate-Vitamin D (CALTRATE 600+D PO) Take 1 tablet by mouth 2 (two) times daily.      . Echinacea 125 MG CAPS Take by mouth. 1 tablet daily      . Estradiol (VIVELLE-DOT TD) Place 0.5 patches onto the skin 2 (two) times a week.      . ferrous sulfate 325 (65 FE) MG tablet Take 65 mg by mouth daily with breakfast.      . FLUoxetine (PROZAC) 20 MG/5ML solution 20 mg/2ml. Take 1 teaspoonful daily      . Multiple Vitamin (MULITIVITAMIN WITH MINERALS) TABS Take 1 tablet by mouth daily.      . Omega-3 Fatty Acids (OMEGA-3 FISH OIL PO) Take 1,200 mg by mouth. Take 2 tablets daily      . pravastatin (PRAVACHOL) 80 MG tablet Take 80 mg by mouth daily.      . primidone (MYSOLINE) 250 MG tablet Take 250 mg by mouth 2 (two) times daily.      . temazepam (RESTORIL) 30 MG capsule Take 30 mg by mouth at bedtime as needed. For sleep      . vitamin B-12 (CYANOCOBALAMIN) 50 MCG tablet Take 50 mcg by mouth daily.        ALLERGIES:  is allergic  to penicillins.  REVIEW OF SYSTEMS:  The rest of the 14-point review of system was negative.   Filed Vitals:   03/30/12 0851  BP: 129/71  Pulse: 78   Wt Readings from Last 3 Encounters:  03/30/12 156 lb 4.8 oz (70.897 kg)   ECOG Performance status: 0  PHYSICAL EXAMINATION:   General:  Thin-appearing woman, in no acute distress.  Eyes:  no scleral icterus.  ENT:  There were no oropharyngeal lesions.  Neck was without thyromegaly.  Lymphatics:  Negative cervical, supraclavicular or axillary adenopathy.  Respiratory: lungs were clear bilaterally without wheezing or crackles.  Cardiovascular:  Regular rate and rhythm, S1/S2, without murmur, rub or gallop.  There was no pedal edema.  GI:  abdomen was soft, flat, nontender, nondistended, without organomegaly.  Muscoloskeletal:  no spinal tenderness of palpation of vertebral spine.  Skin exam was without echymosis, petichae.  Neuro exam was nonfocal.  Patient was able to get on and off exam table without assistance.  Gait was normal.  Patient was alerted and oriented.  Attention was good.   Language was appropriate.  Mood was normal without depression.  Speech was  not pressured.  Thought content was not tangential.    LABORATORY/RADIOLOGY DATA:  Lab Results  Component Value Date   WBC 3.7* 03/30/2012   HGB 12.7 03/30/2012   HCT 38.0 03/30/2012   PLT 165 03/30/2012   GLUCOSE 56* 07/01/2011   GLUCOSE 56* 07/01/2011   ALKPHOS 95 07/01/2011   ALKPHOS 95 07/01/2011   ALT 25 07/01/2011   ALT 25 07/01/2011   AST 36 07/01/2011   AST 36 07/01/2011   NA 140 07/01/2011   NA 140 07/01/2011   K 3.9 07/01/2011   K 3.9 07/01/2011   CL 101 07/01/2011   CL 101 07/01/2011   CREATININE 0.80 07/01/2011   CREATININE 0.80 07/01/2011   BUN 20 07/01/2011   BUN 20 07/01/2011   CO2 29 07/01/2011   CO2 29 07/01/2011    ASSESSMENT AND PLAN:   1.  Depression:  Stable on Prozac per PCP.  2.  Hyperlipidemia:  On pravastatin per PCP.  3.   Leukopenia with  intermittent neutropenia and thrombocytopenia:  Most likely benign.  These have been on going as far back as 2008; possibly longer.  Cannot rule out medication-induced.  At this time, I have low clinical suspicion for leukemia or bone marrow failure causing these issues as her counts are stable and she has no constitutional symptoms.   - Recommend:  Discharge from Cancer Center.  I appreciate Dr. Norval Gable follow up with her CBC about twice a year.  In there future, if she develops neutropenia (ANC <1); anemia Hgb <10; thrombocytopenia <100, then I will be more than happy to evaluate patient again.   Ms. Kinser expressed informed understanding and agreed with this recommendation.     The length of time of the face-to-face encounter was 10  minutes. More than 50% of time was spent counseling and coordination of care.

## 2012-07-28 ENCOUNTER — Encounter: Payer: Self-pay | Admitting: Cardiology

## 2012-11-12 HISTORY — PX: DOPPLER ECHOCARDIOGRAPHY: SHX263

## 2013-01-26 ENCOUNTER — Encounter (HOSPITAL_COMMUNITY): Payer: Self-pay | Admitting: *Deleted

## 2013-01-26 ENCOUNTER — Emergency Department (HOSPITAL_COMMUNITY): Payer: Medicare Other

## 2013-01-26 ENCOUNTER — Emergency Department (HOSPITAL_COMMUNITY)
Admission: EM | Admit: 2013-01-26 | Discharge: 2013-01-26 | Disposition: A | Discharge status: Home / Self Care | Payer: Medicare Other | Attending: Emergency Medicine | Admitting: Emergency Medicine

## 2013-01-26 DIAGNOSIS — Z8669 Personal history of other diseases of the nervous system and sense organs: Secondary | ICD-10-CM | POA: Insufficient documentation

## 2013-01-26 DIAGNOSIS — R011 Cardiac murmur, unspecified: Secondary | ICD-10-CM | POA: Insufficient documentation

## 2013-01-26 DIAGNOSIS — Q231 Congenital insufficiency of aortic valve: Secondary | ICD-10-CM

## 2013-01-26 DIAGNOSIS — R072 Precordial pain: Secondary | ICD-10-CM

## 2013-01-26 DIAGNOSIS — Z79899 Other long term (current) drug therapy: Secondary | ICD-10-CM | POA: Insufficient documentation

## 2013-01-26 DIAGNOSIS — Z8679 Personal history of other diseases of the circulatory system: Secondary | ICD-10-CM | POA: Insufficient documentation

## 2013-01-26 DIAGNOSIS — Z880 Allergy status to penicillin: Secondary | ICD-10-CM | POA: Insufficient documentation

## 2013-01-26 DIAGNOSIS — R0789 Other chest pain: Secondary | ICD-10-CM | POA: Diagnosis present

## 2013-01-26 DIAGNOSIS — R079 Chest pain, unspecified: Secondary | ICD-10-CM

## 2013-01-26 HISTORY — DX: Decreased white blood cell count, unspecified: D72.819

## 2013-01-26 HISTORY — DX: Congenital insufficiency of aortic valve: Q23.1

## 2013-01-26 HISTORY — DX: Bicuspid aortic valve: Q23.81

## 2013-01-26 LAB — CBC WITH DIFFERENTIAL/PLATELET
Basophils Relative: 0 % (ref 0–1)
Hemoglobin: 12.9 g/dL (ref 12.0–15.0)
MCHC: 33.8 g/dL (ref 30.0–36.0)
Monocytes Relative: 9 % (ref 3–12)
Neutro Abs: 1.6 10*3/uL — ABNORMAL LOW (ref 1.7–7.7)
Neutrophils Relative %: 50 % (ref 43–77)
Platelets: 165 10*3/uL (ref 150–400)
RBC: 4.11 MIL/uL (ref 3.87–5.11)

## 2013-01-26 LAB — POCT I-STAT, CHEM 8
Chloride: 105 mEq/L (ref 96–112)
Glucose, Bld: 88 mg/dL (ref 70–99)
HCT: 38 % (ref 36.0–46.0)
Potassium: 4.5 mEq/L (ref 3.5–5.1)
Sodium: 143 mEq/L (ref 135–145)

## 2013-01-26 LAB — TROPONIN I: Troponin I: <0.3 ng/mL (ref <0.30)

## 2013-01-26 NOTE — ED Provider Notes (Signed)
Medical screening examination/treatment/procedure(s) were conducted as a shared visit with non-physician practitioner(s) and myself.  I personally evaluated the patient during the encounter 10:24 AM  67 yo woman had pain in the left lateral chest last night and this AM. No prior history of heart disease, no injury, no respiratory difficulty. Nonsmoker. Father had coronary artery disease, was a patient of Dr. Peter Swaziland. Exam non-revealing, and EKG, chest x-ray and cardiac markers negative. No explanation for her pain based on exam or testing. Requested  Cardiology, where Dr. Swaziland currently works, to see her.        Carleene Cooper III, MD 01/26/13 [phone]

## 2013-01-26 NOTE — ED Provider Notes (Signed)
History     CSN: [id number]  Arrival date & time 01/26/13  0754   First MD Initiated Contact with Patient 01/26/13 0809      No chief complaint on file.   (Consider location/radiation/quality/duration/timing/severity/associated sxs/prior treatment) HPI  67 year old female with history of mitral insufficiency, heart murmur, and seizure presents for evaluation of chest pain. Patient reports after the usual afternoon walk she notices a discomfort in sensation to the left lateral aspects of the chest. She's unable to describe the sensation but states that it felt unusual. The discomfort does not radiate, mild in intensity, without associated exertional chest pain, diaphoresis, nausea, or shortness of breath. She went to sleep but the next day she experiencing the same sensation. States it is tender to the touch and locate below the left breast. Denies any breast pain or any nipple discharge. No complaint of fever, headache, cough, hemarthrosis, or abdominal pain. No specific treatment tried. No prior history of cardiac disease no prior provocative for a stress test. Does have family history of cardiac disease. Patient is nonsmoker.  Past Medical History  Diagnosis Date  . Seizures     as a child  . Mitral insufficiency   . Heart murmur     Past Surgical History  Procedure Laterality Date  . Oophorectomy    . Partial hysterectomy    . Gum surgery      Family History  Problem Relation Age of Onset  . Multiple myeloma Mother   . Stroke Father   . Hypertension Father   . Heart disease Father   . Kidney disease Father   . Cancer Father     History  Substance Use Topics  . Smoking status: Never Smoker   . Smokeless tobacco: Not on file  . Alcohol Use: 4.2 oz/week    7 Glasses of wine per week    OB History   Grav Para Term Preterm Abortions TAB SAB Ect Mult Living                  Review of Systems  Constitutional:       10 Systems reviewed and all are negative for  acute change except as noted in the HPI.     Allergies  Penicillins  Home Medications   Current Outpatient Rx  Name  Route  Sig  Dispense  Refill  . b complex vitamins tablet   Oral   Take 1 tablet by mouth daily.         . Calcium Carbonate-Vitamin D (CALTRATE 600+D PO)   Oral   Take 1 tablet by mouth 2 (two) times daily.         . Echinacea 125 MG CAPS   Oral   Take by mouth. 1 tablet daily         . Estradiol (VIVELLE-DOT TD)   Transdermal   Place 0.5 patches onto the skin 2 (two) times a week.         . ferrous sulfate 325 (65 FE) MG tablet   Oral   Take 65 mg by mouth daily with breakfast.         . FLUoxetine (PROZAC) 20 MG/5ML solution      20 mg/73ml. Take 1 teaspoonful daily         . Multiple Vitamin (MULITIVITAMIN WITH MINERALS) TABS   Oral   Take 1 tablet by mouth daily.         . Omega-3 Fatty Acids (OMEGA-3 FISH OIL PO)  Oral   Take 1,200 mg by mouth. Take 2 tablets daily         . pravastatin (PRAVACHOL) 80 MG tablet   Oral   Take 80 mg by mouth daily.         . primidone (MYSOLINE) 250 MG tablet   Oral   Take 250 mg by mouth 2 (two) times daily.         . temazepam (RESTORIL) 30 MG capsule   Oral   Take 30 mg by mouth at bedtime as needed. For sleep         . vitamin B-12 (CYANOCOBALAMIN) 50 MCG tablet   Oral   Take 50 mcg by mouth daily.           BP 121/64  Pulse 79  Temp(Src) 99.6 F (37.6 C) (Oral)  Resp 16  SpO2 100%  Physical Exam  Nursing note and vitals reviewed. Constitutional: She is oriented to person, place, and time. She appears well-developed and well-nourished. No distress.  Awake, alert, nontoxic appearance  HENT:  Head: Atraumatic.  Eyes: Conjunctivae are normal. Right eye exhibits no discharge. Left eye exhibits no discharge.  Neck: Neck supple. No JVD present.  Cardiovascular: Normal rate and regular rhythm.   Murmur (3 of 6 systolic murmur to second intercostal space, left chest,  radiates to neck.) heard. Pulmonary/Chest: Effort normal. No respiratory distress. She has no wheezes. She has no rales. She exhibits tenderness (mild tenderness/discomfort along the left lateral aspects of the left breast. Breast examination is unremarkable. Chaperone present).  No crepitus, emphysema or deformity to chest wall.  No overlying skin changes  Abdominal: Soft. There is no tenderness. There is no rebound.  Musculoskeletal: She exhibits no edema and no tenderness.  ROM appears intact, no obvious focal weakness  Neurological: She is alert and oriented to person, place, and time.  Mental status and motor strength appears intact  Skin: No rash noted.  Psychiatric: She has a normal mood and affect.    ED Course  Procedures (including critical care time)  8:14 AM  Date: 01/26/2013  Rate: 75  Rhythm: normal sinus rhythm  QRS Axis: normal  Intervals: normal  ST/T Wave abnormalities: normal  Conduction Disutrbances:none  Narrative Interpretation: Normal EKG  Old EKG Reviewed: none available   8:28 AM Patient presents with atypical chest pain. Pain is reproducible on exam. No significant cardiac risk factors except for age and hypertension. I do not suspect cardiac etiology. Do not suspect PE or infection.  We'll obtain troponin, CBC, i-STAT, and chest x-ray. EKG unremarkable.  10:09 AM Patient has normal electrolytes, WBC 3.1. Normal troponin, normal chest x-ray. At this time I do not the patient has an emergent medical condition. Care discussed with attending who will evaluate patient.  10:49 AM Attending has evaluate patient and unsure the etiology of patient's chest pain. Cardiology has been consult at who will see patient in the ED and will determine disposition. Patient otherwise stable and in no acute distress.  11:27 AM Cardiologist has seen and evaluate pt and felt pt stable for discharge.  Pt will have an echocardiogram perform in the outpt setting at Dr. Cathren Laine office.  Pt is aware.  Return precaution discussed.  Pt stable for discharge.  Labs Reviewed  CBC WITH DIFFERENTIAL - Abnormal; Notable for the following:    WBC 3.1 (*)    Neutro Abs 1.6 (*)    All other components within normal limits  TROPONIN I  POCT  I-STAT, CHEM 8   Dg Chest 2 View  01/26/2013   *RADIOLOGY REPORT*  Clinical Data: Chest pain  CHEST - 2 VIEW  Comparison:  None.  Findings:  The heart size and mediastinal contours are within normal limits.  Both lungs are clear.  The visualized skeletal structures are unremarkable.  IMPRESSION: No active cardiopulmonary disease.   Original Report Authenticated By: Janeece Riggers, M.D.     1. Chest pain at rest       MDM  BP 121/64  Pulse 79  Temp(Src) 99.6 F (37.6 C) (Oral)  Resp 16  SpO2 100%         Fayrene Helper, PA-C 01/26/13 1128

## 2013-01-26 NOTE — ED Provider Notes (Signed)
8:14 AM   Date: 01/26/2013  Rate: 75  Rhythm: normal sinus rhythm  QRS Axis: normal  Intervals: normal  ST/T Wave abnormalities: normal  Conduction Disutrbances:none  Narrative Interpretation: Normal EKG  Old EKG Reviewed: none available      Carleene Cooper III, MD 01/26/13 [phone]

## 2013-01-26 NOTE — ED Notes (Signed)
Patient stating chest pain described as aching starting last night, located under left breast, denies nausea/sob/diaphoresis

## 2013-01-26 NOTE — Progress Notes (Signed)
10:24 AM 67 yo woman had pain in the left lateral chest last night and this AM.  No prior history of heart disease, no injury, no respiratory difficulty.  Nonsmoker.  Father had coronary artery disease, was a patient of Dr. Peter Swaziland.  Exam non-revealing, and EKG, chest x-ray and cardiac markers negative.  No explanation for her pain based on exam or testing.  Requested [facility], where Dr. Swaziland currently works, to see her.

## 2013-01-26 NOTE — H&P (Signed)
History and Physical  Patient ID: Nicole Hess MRN: [id number], SOB: [birth year] 67 y.o. Date of Encounter: 01/26/2013, 10:56 AM  Primary Physician: Garlan Fillers, MD Primary Cardiologist: Dr. Swaziland  Chief Complaint: Chest Pain  HPI: 67 y.o. female w/ PMHx significant for epilepsy, congenital bicuspid aortic valve, who presented to [facility] on 01/26/2013 with complaints of chest pain.  The evening prior to admission, around 5pm, while seated at work, the patient noticed chest pain, described as a dull ache under the left breast, 7/10 in severity without radiation.  No aggravating or alleviating factors, the patient has not taken any medications for this pain.  The patient went for a walk a little while later, with no change in the pain.  The pain persisted the entire evening, and upon awakening this morning, the pain was still present.  The patient notes significant stress at work, as well as moving heavy boxes recently in the setting of moving to a new house.  At baseline, the patient walks for about 0.5-1 mile/day, 5 days/week with no cp or SOB.  EKG revealed NSR with no acute ST changes. CXR was without acute cardiopulmonary abnormalities. Labs are significant for negative troponin x1.   Past Medical History  Diagnosis Date  . Seizures     as a child  . Bicuspid aortic valve   . Heart murmur   . Leukopenia     Seen by Dr. Gaylyn Rong, present since 2008, watchful waiting     Surgical History:  Past Surgical History  Procedure Laterality Date  . Oophorectomy    . Partial hysterectomy    . Gum surgery       Home Meds: Prior to Admission medications   Medication Sig Start Date End Date Taking? Authorizing Provider  b complex vitamins tablet Take 1 tablet by mouth daily.   Yes Historical Provider, MD  Bepotastine Besilate (BEPREVE) 1.5 % SOLN Place 1 drop into the right eye daily as needed (for allergies).   Yes Historical Provider, MD  Calcium Carbonate-Vitamin D  (CALTRATE 600+D PO) Take 1 tablet by mouth 2 (two) times daily.   Yes Historical Provider, MD  Echinacea 125 MG CAPS Take 125 mg by mouth daily.    Yes Historical Provider, MD  Estradiol (VIVELLE-DOT TD) Place 1 patch onto the skin 2 (two) times a week. On Tuesday and Friday   Yes Historical Provider, MD  ferrous sulfate 325 (65 FE) MG tablet Take 325 mg by mouth daily with breakfast.    Yes Historical Provider, MD  Multiple Vitamin (MULITIVITAMIN WITH MINERALS) TABS Take 1 tablet by mouth daily.   Yes Historical Provider, MD  Omega-3 Fatty Acids (OMEGA-3 FISH OIL PO) Take 2 capsules by mouth daily.    Yes Historical Provider, MD  pravastatin (PRAVACHOL) 80 MG tablet Take 80 mg by mouth daily.   Yes Historical Provider, MD  primidone (MYSOLINE) 250 MG tablet Take 250 mg by mouth 2 (two) times daily.   Yes Historical Provider, MD  temazepam (RESTORIL) 30 MG capsule Take 30 mg by mouth at bedtime as needed. For sleep   Yes Historical Provider, MD  vitamin B-12 (CYANOCOBALAMIN) 50 MCG tablet Take 50 mcg by mouth daily.   Yes Historical Provider, MD    Allergies:  Allergies  Allergen Reactions  . Penicillins Rash    History   Social History  . Marital Status: Single    Spouse Name: N/A    Number of Children: N/A  . Years of  Education: N/A   Occupational History  . paralegal    Social History Main Topics  . Smoking status: Never Smoker   . Smokeless tobacco: Not on file  . Alcohol Use: 4.2 oz/week    7 Glasses of wine per week  . Drug Use: Not on file  . Sexually Active: Not on file   Other Topics Concern  . Not on file   Social History Narrative  . No narrative on file     Family History  Problem Relation Age of Onset  . Multiple myeloma Mother   . Stroke Father   . Hypertension Father   . Heart disease Father   . Kidney disease Father   . Cancer Father     Review of Systems: General: negative for chills, fever, night sweats or weight changes.  ENT: negative for  rhinorrhea or epistaxis Cardiovascular: negative for shortness of breath, dyspnea on exertion, edema, orthopnea, palpitations, or paroxysmal nocturnal dyspnea Dermatological: negative for rash Respiratory: negative for cough or wheezing GI: negative for nausea, vomiting, diarrhea, bright red blood per rectum, melena, or hematemesis GU: no hematuria, urgency, or frequency Neurologic: negative for visual changes, syncope, headache, or dizziness Heme: no easy bruising or bleeding Endo: negative for excessive thirst, thyroid disorder, or flushing Musculoskeletal: negative for joint pain or swelling, negative for myalgias All other systems reviewed and are otherwise negative except as noted above.  BP R arm: 141/82 BP L arm: 144/87  Physical Exam: Blood pressure 116/66, pulse 74, temperature 99.6 F (37.6 C), temperature source Oral, resp. rate 12, SpO2 99.00%. General: Well developed, well nourished, alert and oriented, in no acute distress. HEENT: Normocephalic, atraumatic, sclera non-icteric, no xanthomas, nares are without discharge.  Neck: Supple. Carotids 2+ without bruits. JVP normal Lungs: Clear bilaterally to auscultation without wheezes, rales, or rhonchi. Breathing is unlabored. Heart: RRR with normal S1 and S2. No murmurs, rubs, or gallops appreciated.  Tenderness to palpation of left chest. Abdomen: Soft, non-tender, non-distended with normoactive bowel sounds. No hepatomegaly. No rebound/guarding. No obvious abdominal masses. Back: No CVA tenderness Msk:  Strength and tone appear normal for age. Extremities: No clubbing, cyanosis, or edema.  Distal pedal pulses are 2+ and equal bilaterally. Neuro: CNII-XII intact, moves all extremities spontaneously. Psych:  Responds to questions appropriately with a normal affect.   Labs:   Lab Results  Component Value Date   WBC 3.1* 01/26/2013   HGB 12.9 01/26/2013   HCT 38.0 01/26/2013   MCV 92.9 01/26/2013   PLT 165 01/26/2013     Recent Labs Lab 01/26/13 0915  NA 143  K 4.5  CL 105  BUN 17  CREATININE 0.80  GLUCOSE 88    Recent Labs  01/26/13 0853  TROPONINI <0.30   No results found for this basename: CHOL, HDL, LDLCALC, TRIG   No results found for this basename: DDIMER    Radiology/Studies:  Dg Chest 2 View  01/26/2013   *RADIOLOGY REPORT*  Clinical Data: Chest pain  CHEST - 2 VIEW  Comparison:  None.  Findings:  The heart size and mediastinal contours are within normal limits.  Both lungs are clear.  The visualized skeletal structures are unremarkable.  IMPRESSION: No active cardiopulmonary disease.   Original Report Authenticated By: Janeece Riggers, M.D.     EKG: NSR, no ST segment changes  ASSESSMENT AND PLAN:  The patient is a 67 yo man, no cardiac history, presenting with chest pain.  # Chest pain - the patient notes left-sided chest  pain, with no risk factors, with tenderness to palpation.  Etiology is most likely musculoskeletal (in setting of moving boxes) vs stress (which patient notes at work).  Unlikely ACS, as troponin has been negative approximately 18 hours after chest pain onset.  TIMI score is 1 (age), low risk.  Echo from 2010 showed normal LV function, no wall motion abnormalities, with a congenital bicuspid aortic valve. -repeat echo as outpatient -ok to discharge, with f/u with Dr. Sw[place]iland  # Leukopenia - present since 08, followed by Dr. Katherine Basset MD 01/26/2013, 10:56 AM  As above, patient seen and examined. Briefly she is a 67 year old female with past medical history of bicuspid aortic valve, seizure disorder for evaluation of chest pain. Echocardiogram in May of 2010 showed normal LV function, normal aortic root and mild aortic stenosis. Patient typically does not have dyspnea on exertion, orthopnea, PND, pedal edema, syncope or exertional chest pain. Yesterday at approximately 5 PM she noticed a pain in the left lateral chest wall lateral to her breast. She finds  the pain difficult to describe. It does not radiate. No associated symptoms. Not pleuritic, positional, related to food or exacerbated by exertion. She has taken no medications for the pain. It has been continuous for 18 hours without complete resolution. She has not traveled recently nor has she injured her legs. Physical exam shows a 2/6 systolic murmur. S2 is not diminished. Her blood pressure and her upper extremities is equal. Some tenderness to palpation in the lateral chest wall. Her electrocardiogram shows normal sinus rhythm with no ST changes. Her enzymes are negative x1. Her chest pain is extremely atypical and may be musculoskeletal. She has essentially ruled out for myocardial infarction as her enzymes are negative and her chest pain has been persistent for 18 hours (would expect increase in enzymes at 8-10 hours after pain onset if this was ischemia mediated). She is noted to have a systolic murmur on examination but S2 is not diminished. I therefore do not think she has significant aortic stenosis. She can be discharged from a cardiac standpoint and followup with Dr. Sw[place]and in 2 weeks. I will also arrange an outpatient echocardiogram to assess her aortic valve and aortic stenosis. Olga Millers 11:16 AM

## 2013-01-31 ENCOUNTER — Other Ambulatory Visit (HOSPITAL_COMMUNITY): Payer: Self-pay | Admitting: Cardiology

## 2013-01-31 DIAGNOSIS — R079 Chest pain, unspecified: Secondary | ICD-10-CM

## 2013-02-01 ENCOUNTER — Ambulatory Visit (HOSPITAL_COMMUNITY): Payer: Medicare Other | Attending: Cardiology | Admitting: Radiology

## 2013-02-01 DIAGNOSIS — I359 Nonrheumatic aortic valve disorder, unspecified: Secondary | ICD-10-CM | POA: Insufficient documentation

## 2013-02-01 DIAGNOSIS — R072 Precordial pain: Secondary | ICD-10-CM

## 2013-02-01 DIAGNOSIS — I079 Rheumatic tricuspid valve disease, unspecified: Secondary | ICD-10-CM | POA: Insufficient documentation

## 2013-02-01 DIAGNOSIS — R011 Cardiac murmur, unspecified: Secondary | ICD-10-CM

## 2013-02-01 DIAGNOSIS — R079 Chest pain, unspecified: Secondary | ICD-10-CM

## 2013-02-01 NOTE — Progress Notes (Signed)
Echocardiogram performed.  

## 2013-02-07 ENCOUNTER — Encounter: Payer: BC Managed Care – PPO | Admitting: Cardiology

## 2013-02-07 ENCOUNTER — Encounter: Payer: Self-pay | Admitting: Cardiology

## 2013-02-07 ENCOUNTER — Ambulatory Visit (INDEPENDENT_AMBULATORY_CARE_PROVIDER_SITE_OTHER): Payer: Medicare Other | Admitting: Cardiology

## 2013-02-07 VITALS — BP 130/80 | HR 80 | Ht 68.5 in | Wt 153.0 lb

## 2013-02-07 DIAGNOSIS — I350 Nonrheumatic aortic (valve) stenosis: Secondary | ICD-10-CM

## 2013-02-07 DIAGNOSIS — R072 Precordial pain: Secondary | ICD-10-CM

## 2013-02-07 DIAGNOSIS — I359 Nonrheumatic aortic valve disorder, unspecified: Secondary | ICD-10-CM

## 2013-02-07 DIAGNOSIS — Q231 Congenital insufficiency of aortic valve: Secondary | ICD-10-CM

## 2013-02-07 NOTE — Patient Instructions (Addendum)
Continue your current therapy  I will see you in one year with an echocardiogram about 1 week prior to appt.

## 2013-02-07 NOTE — Progress Notes (Signed)
Nicole Hess Date of Birth: September 29, 1945 Medical Record #[id number]  History of Present Illness: Nicole Hess is seen today for followup. She is a 67 year old white female who has a history of bicuspid aortic valve with aortic stenosis. Evaluation in 2010 with echocardiogram showed a mean gradient of 14 mmHg consistent with mild aortic stenosis. Recently she was seen in the emergency department with atypical chest pain. Evaluation with ECG and cardiac enzymes is unremarkable. She has been under a great deal of stress related to her job and this was felt to be aggravating her symptoms. On followup today her chest pain has resolved. She denies any exertional chest pain, dyspnea, or dizziness.  Current Outpatient Prescriptions on File Prior to Visit  Medication Sig Dispense Refill  . b complex vitamins tablet Take 1 tablet by mouth daily.      . Bepotastine Besilate (BEPREVE) 1.5 % SOLN Place 1 drop into the right eye daily as needed (for allergies).      . Calcium Carbonate-Vitamin D (CALTRATE 600+D PO) Take 1 tablet by mouth 2 (two) times daily.      . Echinacea 125 MG CAPS Take 125 mg by mouth daily.       . Estradiol (VIVELLE-DOT TD) Place 1 patch onto the skin 2 (two) times a week. On Tuesday and Friday      . ferrous sulfate 325 (65 FE) MG tablet Take 325 mg by mouth daily with breakfast.       . Multiple Vitamin (MULITIVITAMIN WITH MINERALS) TABS Take 1 tablet by mouth daily.      . Omega-3 Fatty Acids (OMEGA-3 FISH OIL PO) Take 2 capsules by mouth daily.       . pravastatin (PRAVACHOL) 80 MG tablet Take 80 mg by mouth daily.      . primidone (MYSOLINE) 250 MG tablet Take 250 mg by mouth 2 (two) times daily.      . temazepam (RESTORIL) 30 MG capsule Take 30 mg by mouth at bedtime as needed. For sleep      . vitamin B-12 (CYANOCOBALAMIN) 50 MCG tablet Take 50 mcg by mouth daily.       No current facility-administered medications on file prior to visit.    Allergies  Allergen Reactions  .  Penicillins Rash    Past Medical History  Diagnosis Date  . Seizures     as a child  . Bicuspid aortic valve   . Aortic stenosis   . Leukopenia     Seen by Dr. Gaylyn Rong, present since 2008, watchful waiting    Past Surgical History  Procedure Laterality Date  . Oophorectomy    . Partial hysterectomy    . Gum surgery      History  Smoking status  . Never Smoker   Smokeless tobacco  . Not on file    History  Alcohol Use  . 4.2 oz/week  . 7 Glasses of wine per week    Family History  Problem Relation Age of Onset  . Multiple myeloma Mother   . Stroke Father   . Hypertension Father   . Heart disease Father 54  . Kidney disease Father   . Cancer Father     Review of Systems: The review of systems is positive for chronic anxiety.  All other systems were reviewed and are negative.  Physical Exam: BP 130/80  Pulse 80  Ht 5' 8.5" (1.74 m)  Wt 153 lb (69.4 kg)  BMI 22.92 kg/m2 She is a pleasant white  female in no acute distress. HEENT: Normocephalic, atraumatic. Pupils equal round and reactive to light and accommodation. Extraocular movements are full. Oropharynx is clear. Neck: No JVD, adenopathy, thyromegaly, or bruits. Lungs: Clear Cardiovascular: Regular rate and rhythm. Normal S1 and S2. Grade 2/6 systolic murmur heard best at right upper sternal border. Abdomen: Soft and nontender. No masses or bruits. Bowel sounds positive. Extremities: No cyanosis or edema. Pulses are 2+ and symmetric. Skin: Warm and dry Neuro: Alert and oriented x3. Cranial nerves II through XII are intact. Is appropriate.  LABORATORY DATA: Echo:Study Conclusions  - Left ventricle: The cavity size was normal. Wall thickness was normal. The estimated ejection fraction was 65%. Wall motion was normal; there were no regional wall motion abnormalities. - Aortic valve: There is leaflet calcification with moderate Aortic Stenosis. I am not sure of the anatomy. I suspect that this valve is  bicuspid. However, I do not see doming. The aortic root size is normal. The ascending aorta is not fully assessed. No significant regurgitation. Mean gradient: 25mm Hg (S). Peak gradient: 41mm Hg (S). - Tricuspid valve: Mild regurgitation.    Assessment / Plan: 1. Atypical chest pain. I doubt this was cardiac. May be related to recent stressors.  2. Moderate aortic stenosis with bicuspid aortic valve. Aortic root appears normal in size. There has been progression of her gradient since 2010. I recommended followup in one year with repeat echocardiogram.

## 2013-03-14 ENCOUNTER — Telehealth: Payer: Self-pay

## 2013-03-14 NOTE — Telephone Encounter (Signed)
Spoke to patient received your note that you forgot to mention to Dr.Jordan at your last visit that you have swelling in left ankle.Dr.Jordan was made aware.Advised to decrease salt intake and elevate as much as possible.Patient stated swelling comes and goes.Advised to call back if continues or gets worse.

## 2013-03-15 ENCOUNTER — Encounter: Payer: BC Managed Care – PPO | Admitting: Cardiology

## 2013-03-27 ENCOUNTER — Other Ambulatory Visit: Payer: Self-pay | Admitting: *Deleted

## 2013-03-27 MED ORDER — ESTRADIOL 10 MCG VA TABS: 10.0000 ug | ORAL_TABLET | VAGINAL | Status: DC

## 2013-03-27 NOTE — Telephone Encounter (Signed)
Fax from pt requesting refill on "blue hormone sticks".  I spoke to the patient and she states she is in need of Vagifem.  She has used the samples Dr. Hyacinth Meeker gave her.  Pt would like these called to [facility].

## 2013-03-27 NOTE — Telephone Encounter (Signed)
Faxed refill request received from patient for VAGIFEM Last filled by MD on 03/31/12 x 1 year Last AEX - 03/31/12 Next AEX - 04/20/13 Pt would like 30 day supply sent to [facility].  RX sent.

## 2013-04-18 ENCOUNTER — Encounter: Payer: Self-pay | Admitting: Obstetrics & Gynecology

## 2013-04-19 ENCOUNTER — Encounter: Payer: Self-pay | Admitting: Obstetrics & Gynecology

## 2013-04-20 ENCOUNTER — Ambulatory Visit (INDEPENDENT_AMBULATORY_CARE_PROVIDER_SITE_OTHER): Payer: Medicare Other | Admitting: Obstetrics & Gynecology

## 2013-04-20 ENCOUNTER — Encounter: Payer: Self-pay | Admitting: Obstetrics & Gynecology

## 2013-04-20 VITALS — BP 124/76 | HR 60 | Resp 16 | Ht 67.75 in | Wt 155.6 lb

## 2013-04-20 DIAGNOSIS — Z01419 Encounter for gynecological examination (general) (routine) without abnormal findings: Secondary | ICD-10-CM

## 2013-04-20 DIAGNOSIS — Z124 Encounter for screening for malignant neoplasm of cervix: Secondary | ICD-10-CM

## 2013-04-20 MED ORDER — ESTRADIOL 10 MCG VA TABS: 10.0000 ug | ORAL_TABLET | VAGINAL | Status: DC

## 2013-04-20 MED ORDER — ESTRADIOL 0.5 MG PO TABS: 0.5000 mg | ORAL_TABLET | Freq: Every day | ORAL | Status: DC

## 2013-04-20 NOTE — Progress Notes (Signed)
67 y.o. G0P0000 SingleCaucasianF here for annual exam.  No vaginal bleeding.  Went to ER in May due to chest pain.  Work up was negative except for worsening aortic stenosis.  She will have follow-up one year.   Rested over the weekend after ER visit and symptoms resolved.    Patient's last menstrual period was 09/15/1987.          Sexually active: no  The current method of family planning is status post hysterectomy.    Exercising: yes  walking Smoker:  no  Health Maintenance: Pap:  03/11/11 WNL History of abnormal Pap:  yes MMG:  10/20/11 normal Colonoscopy:  11/10 repeat in 5 years, Dr. Kinnie Scales BMD:   12/22/11 stable TDaP:  2008 Screening Labs: PCP, Hb today: PCP, Urine today: PCP   reports that she has never smoked. She has never used smokeless tobacco. She reports that she drinks about 3.6 ounces of alcohol per week. She reports that she does not use illicit drugs.  Past Medical History  Diagnosis Date  . Seizures     as a child  . Bicuspid aortic valve   . Aortic stenosis   . Leukopenia     Seen by Dr. Gaylyn Rong, present since 2008, watchful waiting  . Gout   . Osteopenia   . Abnormal Pap smear   . Anxiety   . Heart murmur     Past Surgical History  Procedure Laterality Date  . Oophorectomy    . Partial hysterectomy    . Gum surgery    . Tubal ligation    . Cervical cone biopsy    . Vaginal hysterectomy      for dysplasia  . Colonoscopy  2010  . Doppler echocardiography  3/14    Current Outpatient Prescriptions  Medication Sig Dispense Refill  . Ascorbic Acid (VITAMIN C) 1000 MG tablet Take 1,000 mg by mouth daily.      Marland Kitchen b complex vitamins tablet Take 1 tablet by mouth daily.      . Calcium Carbonate-Vitamin D (CALTRATE 600+D PO) Take 1 tablet by mouth 2 (two) times daily.      . cholecalciferol (VITAMIN D) 1000 UNITS tablet Take 1,000 Units by mouth. Two daily      . Echinacea 125 MG CAPS Take 125 mg by mouth daily.       . Estradiol (VAGIFEM) 10 MCG TABS vaginal  tablet Place 1 tablet (10 mcg total) vaginally 2 (two) times a week.  8 tablet  0  . estradiol (VIVELLE-DOT) 0.0375 MG/24HR Place 1 patch onto the skin 2 (two) times a week.      . ferrous sulfate 325 (65 FE) MG tablet Take 325 mg by mouth daily with breakfast.       . FLUoxetine (PROZAC) 20 MG/5ML solution Take 20 mg by mouth daily. 1 tsp daily      . Glucosamine-Chondroitin (OSTEO BI-FLEX REGULAR STRENGTH PO) Take by mouth.      . Multiple Vitamin (MULITIVITAMIN WITH MINERALS) TABS Take 1 tablet by mouth daily.      . Omega-3 Fatty Acids (OMEGA-3 FISH OIL PO) Take 2 capsules by mouth daily.       . pravastatin (PRAVACHOL) 80 MG tablet Take 80 mg by mouth daily.      . primidone (MYSOLINE) 250 MG tablet Take 250 mg by mouth 2 (two) times daily.      . temazepam (RESTORIL) 30 MG capsule Take 30 mg by mouth at bedtime as needed. For  sleep      . Tretinoin Microsphere (RETIN-A MICRO EX) Apply topically.      . vitamin B-12 (CYANOCOBALAMIN) 50 MCG tablet Take 50 mcg by mouth daily.       No current facility-administered medications for this visit.    Family History  Problem Relation Age of Onset  . Multiple myeloma Mother   . Stroke Father   . Hypertension Father   . Heart disease Father 79  . Kidney disease Father   . Cancer Father   . Cancer Maternal Grandmother     renal    ROS:  Pertinent items are noted in HPI.  Otherwise, a comprehensive ROS was negative.  Exam:   BP 124/76  Pulse 60  Resp 16  Ht 5' 7.75" (1.721 m)  Wt 155 lb 9.6 oz (70.58 kg)  BMI 23.83 kg/m2  LMP 09/15/1987  Weight change: +3lb    Height: 5' 7.75" (172.1 cm)  Ht Readings from Last 3 Encounters:  04/20/13 5' 7.75" (1.721 m)  02/07/13 5' 8.5" (1.74 m)  03/30/12 5\' 8"  (1.727 m)    General appearance: alert, cooperative and appears stated age Head: Normocephalic, without obvious abnormality, atraumatic Neck: no adenopathy, supple, symmetrical, trachea midline and thyroid normal to inspection and  palpation Lungs: clear to auscultation bilaterally Breasts: normal appearance, no masses or tenderness Heart: regular rate and rhythm Abdomen: soft, non-tender; bowel sounds normal; no masses,  no organomegaly Extremities: extremities normal, atraumatic, no cyanosis or edema Skin: Skin color, texture, turgor normal. No rashes or lesions Lymph nodes: Cervical, supraclavicular, and axillary nodes normal. No abnormal inguinal nodes palpated Neurologic: Grossly normal   Pelvic: External genitalia:  no lesions              Urethra:  normal appearing urethra with no masses, tenderness or lesions              Bartholins and Skenes: normal                 Vagina: normal appearing vagina with normal color and discharge, no lesions              Cervix: absent              Pap taken: yes Bimanual Exam:  Uterus:  uterus absent              Adnexa: no mass, fullness, tenderness               Rectovaginal: Confirms               Anus:  normal sphincter tone, no lesions  A:  Well Woman with normal exam Remote hx of dysplasia, s/p TVH S/P laparoscopic BSO 9/08 Osteopenia H/O gout Vaginal atrophic changes  P:   Mammogram yearly.  Will schedule for patient. pap smear today.  Desires every other year.  D/w guidelines, still desires. Change to estradiol 0.5mg  due to cost.  #90/4RF.  She will call if has any symptoms Continue vagifem twice weekly.  Rx to pharmacy On drug holiday from Fosamax.  BMD next year.. return annually or prn  An After Visit Summary was printed and given to the patient.

## 2013-04-20 NOTE — Patient Instructions (Addendum)

## 2013-04-21 LAB — IPS PAP SMEAR ONLY

## 2013-04-24 ENCOUNTER — Telehealth: Payer: Self-pay

## 2013-04-24 NOTE — Telephone Encounter (Signed)
Notified patient of [facility] appointment made for 05/03/13 at 9:30. Patient asking about new HRT Estradiol. Informed this is to replace her Vivelle Dot secondary to the cost. She will finish all the patches that she has and then will start the estradiol tablet. If any problems will call us back. Aware I will call her back if this is not correct. Please advise.

## 2013-05-05 ENCOUNTER — Encounter: Payer: Self-pay | Admitting: Obstetrics & Gynecology

## 2013-05-17 ENCOUNTER — Encounter: Payer: Self-pay | Admitting: Cardiology

## 2013-05-17 ENCOUNTER — Encounter: Payer: Self-pay | Admitting: Obstetrics & Gynecology

## 2013-05-24 NOTE — Progress Notes (Unsigned)
See Docu note.

## 2013-05-24 NOTE — Telephone Encounter (Signed)
Follow Up  Following up from email and phone call.. Pt request that you give her a call back work [phone]   About Dr. Jarold Motto.

## 2013-06-02 ENCOUNTER — Encounter: Payer: Self-pay | Admitting: Obstetrics & Gynecology

## 2013-06-13 ENCOUNTER — Encounter: Payer: Self-pay | Admitting: Cardiology

## 2013-06-14 ENCOUNTER — Telehealth: Payer: Self-pay | Admitting: Cardiology

## 2013-06-14 NOTE — Telephone Encounter (Signed)
Returned call to patient flu vaccine does not interfer with heart valve.Ok to have your flu shot.

## 2013-06-14 NOTE — Telephone Encounter (Signed)
New problem   Pt has questions about the flu shot interfering with heart valve. Please call pt

## 2013-06-22 ENCOUNTER — Encounter: Payer: Self-pay | Admitting: Obstetrics & Gynecology

## 2013-07-20 ENCOUNTER — Other Ambulatory Visit: Payer: Self-pay

## 2013-08-12 ENCOUNTER — Emergency Department (HOSPITAL_COMMUNITY)
Admission: EM | Admit: 2013-08-12 | Discharge: 2013-08-12 | Disposition: A | Discharge status: Home / Self Care | Payer: Medicare Other | Attending: Emergency Medicine | Admitting: Emergency Medicine

## 2013-08-12 ENCOUNTER — Encounter (HOSPITAL_COMMUNITY): Payer: Self-pay | Admitting: Emergency Medicine

## 2013-08-12 DIAGNOSIS — R000 Tachycardia, unspecified: Secondary | ICD-10-CM | POA: Insufficient documentation

## 2013-08-12 DIAGNOSIS — R011 Cardiac murmur, unspecified: Secondary | ICD-10-CM | POA: Insufficient documentation

## 2013-08-12 DIAGNOSIS — Z8739 Personal history of other diseases of the musculoskeletal system and connective tissue: Secondary | ICD-10-CM | POA: Insufficient documentation

## 2013-08-12 DIAGNOSIS — F411 Generalized anxiety disorder: Secondary | ICD-10-CM | POA: Insufficient documentation

## 2013-08-12 DIAGNOSIS — Z79899 Other long term (current) drug therapy: Secondary | ICD-10-CM | POA: Insufficient documentation

## 2013-08-12 DIAGNOSIS — Z880 Allergy status to penicillin: Secondary | ICD-10-CM | POA: Insufficient documentation

## 2013-08-12 DIAGNOSIS — E876 Hypokalemia: Secondary | ICD-10-CM | POA: Insufficient documentation

## 2013-08-12 DIAGNOSIS — R197 Diarrhea, unspecified: Secondary | ICD-10-CM | POA: Insufficient documentation

## 2013-08-12 DIAGNOSIS — Z792 Long term (current) use of antibiotics: Secondary | ICD-10-CM | POA: Insufficient documentation

## 2013-08-12 DIAGNOSIS — G40909 Epilepsy, unspecified, not intractable, without status epilepticus: Secondary | ICD-10-CM | POA: Insufficient documentation

## 2013-08-12 LAB — POCT I-STAT, CHEM 8
BUN: 17 mg/dL (ref 6–23)
Calcium, Ion: 1.14 mmol/L (ref 1.13–1.30)
Chloride: 103 mEq/L (ref 96–112)
Creatinine, Ser: 0.9 mg/dL (ref 0.50–1.10)
Glucose, Bld: 92 mg/dL (ref 70–99)
HCT: 35 % — ABNORMAL LOW (ref 36.0–46.0)
Hemoglobin: 11.9 g/dL — ABNORMAL LOW (ref 12.0–15.0)
Potassium: 3.3 mEq/L — ABNORMAL LOW (ref 3.5–5.1)
Sodium: 142 mEq/L (ref 135–145)
TCO2: 28 mmol/L (ref 0–100)

## 2013-08-12 LAB — URINALYSIS, ROUTINE W REFLEX MICROSCOPIC
Bilirubin Urine: NEGATIVE
Glucose, UA: NEGATIVE mg/dL
Hgb urine dipstick: NEGATIVE
Ketones, ur: 15 mg/dL — AB
Leukocytes, UA: NEGATIVE
Nitrite: NEGATIVE
Protein, ur: NEGATIVE mg/dL
Specific Gravity, Urine: 1.01 (ref 1.005–1.030)
Urobilinogen, UA: 0.2 mg/dL (ref 0.0–1.0)
pH: 5.5 (ref 5.0–8.0)

## 2013-08-12 MED ORDER — ZINC OXIDE 40 % EX OINT: 1.0000 "application " | TOPICAL_OINTMENT | CUTANEOUS | Status: DC | PRN

## 2013-08-12 MED ORDER — LOPERAMIDE HCL 2 MG PO CAPS: 2.0000 mg | ORAL_CAPSULE | Freq: Four times a day (QID) | ORAL | Status: DC | PRN

## 2013-08-12 NOTE — ED Notes (Signed)
Pt states that on Monday she started having watery brown stuff coming out her rectum with decreased urination.  Pt states then started urinating yesterday.  Pt states she had a little bowel movement this am.  No fever or pain.

## 2013-08-16 NOTE — ED Provider Notes (Addendum)
CSN: [id number]     Arrival date & time 08/12/13  5621 History   First MD Initiated Contact with Patient 08/12/13 0940     Chief Complaint  Patient presents with  . Diarrhea   (Consider location/radiation/quality/duration/timing/severity/associated sxs/prior Treatment) HPI  67 year old female with diarrhea. Symptom onset Monday. Watery brown stool. No blood or melena. No fevers or chills. She feels like her urination has been somewhat decreased since yesterday. Otherwise no urinary complaints. Denies any abdominal or back pain. No significant travel history. No recent antibiotic usage. No sick contacts.  Past Medical History  Diagnosis Date  . Seizures     as a child  . Bicuspid aortic valve   . Aortic stenosis   . Leukopenia     Seen by Dr. Gaylyn Rong, present since 2008, watchful waiting  . Gout   . Osteopenia   . Abnormal Pap smear   . Anxiety   . Heart murmur    Past Surgical History  Procedure Laterality Date  . Oophorectomy    . Partial hysterectomy    . Gum surgery    . Tubal ligation    . Cervical cone biopsy    . Vaginal hysterectomy      for dysplasia  . Colonoscopy  2010  . Doppler echocardiography  3/14   Family History  Problem Relation Age of Onset  . Multiple myeloma Mother   . Stroke Father   . Hypertension Father   . Heart disease Father 23  . Kidney disease Father   . Cancer Father   . Cancer Maternal Grandmother     renal   History  Substance Use Topics  . Smoking status: Never Smoker   . Smokeless tobacco: Never Used  . Alcohol Use: 3.6 oz/week    6 Glasses of wine per week   OB History   Grav Para Term Preterm Abortions TAB SAB Ect Mult Living   0 0 0 0 0 0 0 0 0 0      Review of Systems  All systems reviewed and negative, other than as noted in HPI.   Allergies  Penicillins  Home Medications   Current Outpatient Rx  Name  Route  Sig  Dispense  Refill  . Ascorbic Acid (VITAMIN C) 1000 MG tablet   Oral   Take 1,000 mg by mouth  daily.         Marland Kitchen b complex vitamins tablet   Oral   Take 1 tablet by mouth daily.         . Calcium Carbonate-Vitamin D (CALTRATE 600+D PO)   Oral   Take 1 tablet by mouth 2 (two) times daily.         . cholecalciferol (VITAMIN D) 1000 UNITS tablet   Oral   Take 1,000 Units by mouth daily.          . Echinacea 125 MG CAPS   Oral   Take 125 mg by mouth daily.          Marland Kitchen estradiol (ESTRACE) 0.5 MG tablet   Oral   Take 1 tablet (0.5 mg total) by mouth daily.   90 tablet   4   . Estradiol (VAGIFEM) 10 MCG TABS vaginal tablet   Vaginal   Place 1 tablet (10 mcg total) vaginally 2 (two) times a week.   24 tablet   3   . ferrous sulfate 325 (65 FE) MG tablet   Oral   Take 325 mg by mouth daily with breakfast.          .  Glucosamine-Chondroitin (OSTEO BI-FLEX REGULAR STRENGTH PO)   Oral   Take 1 tablet by mouth 2 (two) times daily.          . Multiple Vitamin (MULITIVITAMIN WITH MINERALS) TABS   Oral   Take 1 tablet by mouth daily.         Marland Kitchen ofloxacin (FLOXIN) 0.3 % otic solution   Right Ear   Place 1 drop into the right ear daily.         . Omega-3 Fatty Acids (OMEGA-3 FISH OIL PO)   Oral   Take 1 capsule by mouth 2 (two) times daily.          . pravastatin (PRAVACHOL) 80 MG tablet   Oral   Take 80 mg by mouth daily.         . primidone (MYSOLINE) 250 MG tablet   Oral   Take 250 mg by mouth 2 (two) times daily.         . temazepam (RESTORIL) 30 MG capsule   Oral   Take 30 mg by mouth at bedtime as needed. For sleep         . Tretinoin Microsphere (RETIN-A MICRO EX)   Apply externally   Apply 1 application topically daily.          . vitamin B-12 (CYANOCOBALAMIN) 50 MCG tablet   Oral   Take 50 mcg by mouth daily.         Marland Kitchen liver oil-zinc oxide (DESITIN) 40 % ointment   Topical   Apply 1 application topically as needed for irritation.   56.7 g   0   . loperamide (IMODIUM) 2 MG capsule   Oral   Take 1 capsule (2 mg  total) by mouth 4 (four) times daily as needed for diarrhea or loose stools.   12 capsule   0    BP 129/73  Pulse 65  Temp(Src) 98.9 F (37.2 C) (Oral)  Resp 16  Wt 150 lb (68.04 kg)  SpO2 98%  LMP 09/15/1987 Physical Exam  Nursing note and vitals reviewed. Constitutional: She appears well-developed and well-nourished. No distress.  HENT:  Head: Normocephalic and atraumatic.  Eyes: Conjunctivae are normal. Right eye exhibits no discharge. Left eye exhibits no discharge.  Neck: Neck supple.  Cardiovascular: Normal rate, regular rhythm and normal heart sounds.  Exam reveals no gallop and no friction rub.   No murmur heard. Pulmonary/Chest: Effort normal and breath sounds normal. No respiratory distress.  Abdominal: Soft. She exhibits no distension. There is no tenderness.  Musculoskeletal: She exhibits no edema and no tenderness.  Neurological: She is alert.  Skin: Skin is warm and dry.  Psychiatric: She has a normal mood and affect. Her behavior is normal. Thought content normal.    ED Course  Procedures (including critical care time) Labs Review Labs Reviewed  URINALYSIS, ROUTINE W REFLEX MICROSCOPIC - Abnormal; Notable for the following:    APPearance CLOUDY (*)    Ketones, ur 15 (*)    All other components within normal limits  POCT I-STAT, CHEM 8 - Abnormal; Notable for the following:    Potassium 3.3 (*)    Hemoglobin 11.9 (*)    HCT 35.0 (*)    All other components within normal limits   Imaging Review No results found.  EKG Interpretation   None       MDM   1. Diarrhea    67 year old female with tachycardia. Afebrile. Nonbloody. No recent travel. No recent antibiotic usage. Abdomen  is benign. Mild hypokalemia, possibly from GI loss. Plan symptomatic treatment at this time and precautions to stay well hydrated. Emergent return precautions were discussed. Outpatient followup otherwise.    Raeford Razor, MD 08/16/13 2841  Raeford Razor, MD 08/22/13  [phone]

## 2013-08-22 ENCOUNTER — Encounter: Payer: Self-pay | Admitting: Obstetrics & Gynecology

## 2013-08-25 ENCOUNTER — Encounter: Payer: Self-pay | Admitting: Obstetrics & Gynecology

## 2014-04-25 ENCOUNTER — Ambulatory Visit (HOSPITAL_COMMUNITY): Payer: Medicare Other | Attending: Internal Medicine | Admitting: Radiology

## 2014-04-25 ENCOUNTER — Other Ambulatory Visit (HOSPITAL_COMMUNITY): Payer: Self-pay | Admitting: Cardiology

## 2014-04-25 DIAGNOSIS — E785 Hyperlipidemia, unspecified: Secondary | ICD-10-CM | POA: Diagnosis not present

## 2014-04-25 DIAGNOSIS — Q231 Congenital insufficiency of aortic valve: Secondary | ICD-10-CM | POA: Diagnosis not present

## 2014-04-25 DIAGNOSIS — I379 Nonrheumatic pulmonary valve disorder, unspecified: Secondary | ICD-10-CM | POA: Insufficient documentation

## 2014-04-25 DIAGNOSIS — I059 Rheumatic mitral valve disease, unspecified: Secondary | ICD-10-CM | POA: Insufficient documentation

## 2014-04-25 DIAGNOSIS — I517 Cardiomegaly: Secondary | ICD-10-CM | POA: Diagnosis not present

## 2014-04-25 DIAGNOSIS — I079 Rheumatic tricuspid valve disease, unspecified: Secondary | ICD-10-CM | POA: Diagnosis not present

## 2014-04-25 DIAGNOSIS — I359 Nonrheumatic aortic valve disorder, unspecified: Secondary | ICD-10-CM | POA: Diagnosis present

## 2014-04-25 DIAGNOSIS — R079 Chest pain, unspecified: Secondary | ICD-10-CM | POA: Diagnosis not present

## 2014-04-25 NOTE — Progress Notes (Signed)
Echocardiogram performed.  

## 2014-04-30 ENCOUNTER — Telehealth: Payer: Self-pay | Admitting: Cardiology

## 2014-04-30 NOTE — Telephone Encounter (Signed)
Returned call to patient she wanted to ask if echo showed a problem with aortic valve or artery.Patient was told aortic valve stenosis has progressed since last year.Advised to keep appointment with Dr.Jordan 05/02/14 and he will discuss further.

## 2014-04-30 NOTE — Telephone Encounter (Signed)
Please call,have some questions she need to ask  concerning the valve and the artery.

## 2014-04-30 NOTE — Telephone Encounter (Signed)
Returned call to patient she wanted to know if she can go on cruise 05/16/14.Advised to ask Dr.Jordan at appointment 05/02/14.

## 2014-04-30 NOTE — Telephone Encounter (Signed)
Patient has more questions regarding her Echo results.  She also wants to know if she will be able to go on her cruise in September.

## 2014-05-02 ENCOUNTER — Ambulatory Visit (INDEPENDENT_AMBULATORY_CARE_PROVIDER_SITE_OTHER): Payer: Medicare Other | Admitting: Cardiology

## 2014-05-02 ENCOUNTER — Encounter: Payer: Self-pay | Admitting: Cardiology

## 2014-05-02 VITALS — BP 126/84 | HR 82 | Ht 69.0 in | Wt 146.0 lb

## 2014-05-02 DIAGNOSIS — I350 Nonrheumatic aortic (valve) stenosis: Secondary | ICD-10-CM | POA: Insufficient documentation

## 2014-05-02 DIAGNOSIS — I359 Nonrheumatic aortic valve disorder, unspecified: Secondary | ICD-10-CM

## 2014-05-02 DIAGNOSIS — Q231 Congenital insufficiency of aortic valve: Secondary | ICD-10-CM

## 2014-05-02 DIAGNOSIS — E785 Hyperlipidemia, unspecified: Secondary | ICD-10-CM

## 2014-05-02 NOTE — Patient Instructions (Signed)
We will schedule you for an Echocardiogram and office visit in 6 months.

## 2014-05-03 NOTE — Progress Notes (Signed)
Nicole Hess Date of Birth: 11-Jan-1946 Medical Record #[id number]  History of Present Illness: Nicole Hess is seen today for followup. She is a 68 year old white female who has a history of bicuspid aortic valve with aortic stenosis. Evaluation in 2010 with echocardiogram showed a mean gradient of 14 mmHg consistent with mild aortic stenosis. Echo in May 2014 showed a mean gradient of 25 with moderate AS. She states she is doing very well. No chest pain, SOB, palpitations, or dizziness. She remains active walking daily. She has lost 7 lbs.   Current Outpatient Prescriptions on File Prior to Visit  Medication Sig Dispense Refill  . Ascorbic Acid (VITAMIN C) 1000 MG tablet Take 1,000 mg by mouth daily.      Marland Kitchen b complex vitamins tablet Take 1 tablet by mouth daily.      . Calcium Carbonate-Vitamin D (CALTRATE 600+D PO) Take 1 tablet by mouth 2 (two) times daily.      . cholecalciferol (VITAMIN D) 1000 UNITS tablet Take 1,000 Units by mouth daily.       . Echinacea 125 MG CAPS Take 125 mg by mouth daily.       . Glucosamine-Chondroitin (OSTEO BI-FLEX REGULAR STRENGTH PO) Take 1 tablet by mouth 2 (two) times daily.       Marland Kitchen liver oil-zinc oxide (DESITIN) 40 % ointment Apply 1 application topically as needed for irritation.  56.7 g  0  . Multiple Vitamin (MULITIVITAMIN WITH MINERALS) TABS Take 1 tablet by mouth daily.      . Omega-3 Fatty Acids (OMEGA-3 FISH OIL PO) Take 1 capsule by mouth 2 (two) times daily.       . pravastatin (PRAVACHOL) 80 MG tablet Take 80 mg by mouth daily.      . primidone (MYSOLINE) 250 MG tablet Take 250 mg by mouth 2 (two) times daily.      . temazepam (RESTORIL) 30 MG capsule Take 30 mg by mouth at bedtime as needed. For sleep      . Tretinoin Microsphere (RETIN-A MICRO EX) Apply 1 application topically daily.       . vitamin B-12 (CYANOCOBALAMIN) 50 MCG tablet Take 50 mcg by mouth daily.       No current facility-administered medications on file prior to visit.     Allergies  Allergen Reactions  . Penicillins Rash    Past Medical History  Diagnosis Date  . Seizures     as a child  . Bicuspid aortic valve   . Aortic stenosis   . Leukopenia     Seen by Dr. Lamonte Sakai, present since 2008, watchful waiting  . Gout   . Osteopenia   . Abnormal Pap smear   . Anxiety   . Heart murmur     Past Surgical History  Procedure Laterality Date  . Oophorectomy    . Partial hysterectomy    . Gum surgery    . Tubal ligation    . Cervical cone biopsy    . Vaginal hysterectomy      for dysplasia  . Colonoscopy  2010  . Doppler echocardiography  3/14    History  Smoking status  . Never Smoker   Smokeless tobacco  . Never Used    History  Alcohol Use  . 3.6 oz/week  . 6 Glasses of wine per week    Family History  Problem Relation Age of Onset  . Multiple myeloma Mother   . Stroke Father   . Hypertension Father   .  Heart disease Father 70  . Kidney disease Father   . Cancer Father   . Cancer Maternal Grandmother     renal    Review of Systems: The review of systems is positive for chronic anxiety.  All other systems were reviewed and are negative.  Physical Exam: BP 126/84  Pulse 82  Ht 5' 9"  (1.753 m)  Wt 146 lb (66.225 kg)  BMI 21.55 kg/m2  LMP 09/15/1987 She is a pleasant white female in no acute distress. HEENT: Normocephalic, atraumatic. Pupils equal round and reactive to light and accommodation. Extraocular movements are full. Oropharynx is clear. Neck: No JVD, adenopathy, thyromegaly, or bruits. Lungs: Clear Cardiovascular: Regular rate and rhythm. Normal S1 and S2. Grade 3/6 systolic murmur heard best at right upper sternal border radiating to the carotids and apex. Abdomen: Soft and nontender. No masses or bruits. Bowel sounds positive. Extremities: No cyanosis or edema. Pulses are 2+ and symmetric. Skin: Warm and dry Neuro: Alert and oriented x3. Cranial nerves II through XII are intact. Is appropriate.  LABORATORY  DATA:  Ecg: NSR, normal.  Echo:04/25/14:Study Conclusions  - Left ventricle: The cavity size was normal. Wall thickness was normal. Systolic function was normal. The estimated ejection fraction was in the range of 60% to 65%. - Aortic valve: Possible bicuspid valve with severe calcification. There was severe stenosis. There was trivial regurgitation. - Left atrium: The atrium was mildly dilated. - Atrial septum: No defect or patent foramen ovale was identified.  Transthoracic echocardiography. M-mode, complete 2D, spectral Doppler, and color Doppler. Birthdate: Patient birthdate: May 29, 1946. Age: Patient is 68 yr old. Sex: Gender: female. BMI: 22.5 kg/m^2. Patient status: Outpatient. Study date: Study date: 04/25/2014. Study time: 03:13 PM. Location: [facility]  -------------------------------------------------------------------  ------------------------------------------------------------------- Left ventricle: The cavity size was normal. Wall thickness was normal. Systolic function was normal. The estimated ejection fraction was in the range of 60% to 65%.  ------------------------------------------------------------------- Aortic valve: Possible bicuspid valve with severe calcification. Doppler: There was severe stenosis. There was trivial regurgitation. VTI ratio of LVOT to aortic valve: 0.34. Valve area (VTI): 0.97 cm^2. Indexed valve area (VTI): 0.54 cm^2/m^2. Valve area (Vmax): 1.02 cm^2. Indexed valve area (Vmax): 0.57 cm^2/m^2. Mean gradient (S): 46 mm Hg. Peak gradient (S): 69 mm Hg.  ------------------------------------------------------------------- Aorta: The aorta was normal, not dilated, and non-diseased.  ------------------------------------------------------------------- Mitral valve: Mildly thickened leaflets . Doppler: There was trivial regurgitation. Peak gradient (D): 4 mm  Hg.  ------------------------------------------------------------------- Left atrium: The atrium was mildly dilated.  ------------------------------------------------------------------- Atrial septum: No defect or patent foramen ovale was identified.  ------------------------------------------------------------------- Right ventricle: The cavity size was normal. Wall thickness was normal. Systolic function was normal.  ------------------------------------------------------------------- Pulmonic valve: Structurally normal valve. Cusp separation was normal. Doppler: Transvalvular velocity was within the normal range. There was trivial regurgitation.  ------------------------------------------------------------------- Tricuspid valve: Structurally normal valve. Leaflet separation was normal. Doppler: Transvalvular velocity was within the normal range. There was mild regurgitation.  ------------------------------------------------------------------- Right atrium: The atrium was normal in size.  ------------------------------------------------------------------- Pericardium: The pericardium was normal in appearance.  ------------------------------------------------------------------- Post procedure conclusions Ascending Aorta:  - The aorta was normal, not dilated, and non-diseased.       Assessment / Plan: 1. Moderate-severe aortic stenosis with bicuspid aortic valve. Aortic root appears normal in size. There has been progression of her gradient since 2010 and again since 2014. She is asymptomatic. Will increase surveillance with Echo in 6 months. She is to call if she develops any symptoms. Given progression  I think it is likely that she will need AVR in the next few years.

## 2014-05-14 ENCOUNTER — Encounter: Payer: Self-pay | Admitting: Cardiology

## 2014-05-14 ENCOUNTER — Encounter: Payer: Self-pay | Admitting: Obstetrics & Gynecology

## 2014-05-14 ENCOUNTER — Other Ambulatory Visit: Payer: Self-pay

## 2014-05-14 DIAGNOSIS — Q231 Congenital insufficiency of aortic valve: Secondary | ICD-10-CM

## 2014-05-14 DIAGNOSIS — I350 Nonrheumatic aortic (valve) stenosis: Secondary | ICD-10-CM

## 2014-06-02 ENCOUNTER — Emergency Department (HOSPITAL_COMMUNITY): Payer: Medicare Other

## 2014-06-02 ENCOUNTER — Emergency Department (HOSPITAL_COMMUNITY)
Admission: EM | Admit: 2014-06-02 | Discharge: 2014-06-02 | Disposition: A | Discharge status: Home / Self Care | Payer: Medicare Other | Attending: Emergency Medicine | Admitting: Emergency Medicine

## 2014-06-02 ENCOUNTER — Encounter (HOSPITAL_COMMUNITY): Payer: Self-pay | Admitting: Emergency Medicine

## 2014-06-02 DIAGNOSIS — G40909 Epilepsy, unspecified, not intractable, without status epilepticus: Secondary | ICD-10-CM | POA: Diagnosis not present

## 2014-06-02 DIAGNOSIS — Z8679 Personal history of other diseases of the circulatory system: Secondary | ICD-10-CM | POA: Diagnosis not present

## 2014-06-02 DIAGNOSIS — Z880 Allergy status to penicillin: Secondary | ICD-10-CM | POA: Insufficient documentation

## 2014-06-02 DIAGNOSIS — J069 Acute upper respiratory infection, unspecified: Secondary | ICD-10-CM

## 2014-06-02 DIAGNOSIS — Z862 Personal history of diseases of the blood and blood-forming organs and certain disorders involving the immune mechanism: Secondary | ICD-10-CM | POA: Insufficient documentation

## 2014-06-02 DIAGNOSIS — Z8659 Personal history of other mental and behavioral disorders: Secondary | ICD-10-CM | POA: Insufficient documentation

## 2014-06-02 DIAGNOSIS — Z79899 Other long term (current) drug therapy: Secondary | ICD-10-CM | POA: Insufficient documentation

## 2014-06-02 DIAGNOSIS — D72819 Decreased white blood cell count, unspecified: Secondary | ICD-10-CM | POA: Insufficient documentation

## 2014-06-02 DIAGNOSIS — R011 Cardiac murmur, unspecified: Secondary | ICD-10-CM | POA: Insufficient documentation

## 2014-06-02 DIAGNOSIS — R52 Pain, unspecified: Secondary | ICD-10-CM | POA: Insufficient documentation

## 2014-06-02 DIAGNOSIS — M949 Disorder of cartilage, unspecified: Secondary | ICD-10-CM

## 2014-06-02 DIAGNOSIS — Z8639 Personal history of other endocrine, nutritional and metabolic disease: Secondary | ICD-10-CM | POA: Insufficient documentation

## 2014-06-02 DIAGNOSIS — M899 Disorder of bone, unspecified: Secondary | ICD-10-CM | POA: Diagnosis not present

## 2014-06-02 LAB — CBC
HCT: 34.9 % — ABNORMAL LOW (ref 36.0–46.0)
HEMOGLOBIN: 11.8 g/dL — AB (ref 12.0–15.0)
MCH: 31.5 pg (ref 26.0–34.0)
MCHC: 33.8 g/dL (ref 30.0–36.0)
MCV: 93.1 fL (ref 78.0–100.0)
PLATELETS: 157 10*3/uL (ref 150–400)
RBC: 3.75 MIL/uL — ABNORMAL LOW (ref 3.87–5.11)
RDW: 12.9 % (ref 11.5–15.5)
WBC: 6.5 10*3/uL (ref 4.0–10.5)

## 2014-06-02 LAB — BASIC METABOLIC PANEL
Anion gap: 12 (ref 5–15)
BUN: 25 mg/dL — AB (ref 6–23)
CALCIUM: 8.8 mg/dL (ref 8.4–10.5)
CO2: 28 mEq/L (ref 19–32)
CREATININE: 0.81 mg/dL (ref 0.50–1.10)
Chloride: 98 mEq/L (ref 96–112)
GFR calc Af Amer: 86 mL/min — ABNORMAL LOW (ref >90)
GFR calc non Af Amer: 74 mL/min — ABNORMAL LOW (ref >90)
Glucose, Bld: 110 mg/dL — ABNORMAL HIGH (ref 70–99)
Potassium: 4.2 mEq/L (ref 3.7–5.3)
Sodium: 138 mEq/L (ref 137–147)

## 2014-06-02 MED ORDER — GUAIFENESIN-CODEINE 100-10 MG/5ML PO SOLN: 5.0000 mL | Freq: Four times a day (QID) | ORAL | Status: DC | PRN

## 2014-06-02 NOTE — ED Provider Notes (Signed)
TIME SEEN: 10:15 PM  CHIEF COMPLAINT: Body aches, nasal congestion, dry cough, fever  HPI: Patient is a 68 y.o. F with history of aortic stenosis who presents to the emergency department with complaints of fever of 101, nasal congestion, dry cough, body aches for the past 3 days. No sick contacts or recent travel. No chest pain or shortness of breath. No vomiting or diarrhea. No rash. No headache, neck pain or neck stiffness.  ROS: See HPI Constitutional: no fever  Eyes: no drainage  ENT: no runny nose   Cardiovascular:  no chest pain  Resp: no SOB  GI: no vomiting GU: no dysuria Integumentary: no rash  Allergy: no hives  Musculoskeletal: no leg swelling  Neurological: no slurred speech ROS otherwise negative  PAST MEDICAL HISTORY/PAST SURGICAL HISTORY:  Past Medical History  Diagnosis Date  . Seizures     as a child  . Bicuspid aortic valve   . Aortic stenosis   . Leukopenia     Seen by Dr. Lamonte Sakai, present since 2008, watchful waiting  . Gout   . Osteopenia   . Abnormal Pap smear   . Anxiety   . Heart murmur     MEDICATIONS:  Prior to Admission medications   Medication Sig Start Date End Date Taking? Authorizing Provider  Ascorbic Acid (VITAMIN C) 1000 MG tablet Take 1,000 mg by mouth daily.    Historical Provider, MD  b complex vitamins tablet Take 1 tablet by mouth daily.    Historical Provider, MD  Calcium Carbonate-Vitamin D (CALTRATE 600+D PO) Take 1 tablet by mouth 2 (two) times daily.    Historical Provider, MD  cholecalciferol (VITAMIN D) 1000 UNITS tablet Take 1,000 Units by mouth daily.     Historical Provider, MD  Echinacea 125 MG CAPS Take 125 mg by mouth daily.     Historical Provider, MD  Glucosamine-Chondroitin (OSTEO BI-FLEX REGULAR STRENGTH PO) Take 1 tablet by mouth 2 (two) times daily.     Historical Provider, MD  liver oil-zinc oxide (DESITIN) 40 % ointment Apply 1 application topically as needed for irritation. 08/12/13   Virgel Manifold, MD  Multiple  Vitamin (MULITIVITAMIN WITH MINERALS) TABS Take 1 tablet by mouth daily.    Historical Provider, MD  Omega-3 Fatty Acids (OMEGA-3 FISH OIL PO) Take 1 capsule by mouth 2 (two) times daily.     Historical Provider, MD  pravastatin (PRAVACHOL) 80 MG tablet Take 80 mg by mouth daily.    Historical Provider, MD  primidone (MYSOLINE) 250 MG tablet Take 250 mg by mouth 2 (two) times daily.    Historical Provider, MD  temazepam (RESTORIL) 30 MG capsule Take 30 mg by mouth at bedtime as needed. For sleep    Historical Provider, MD  Tretinoin Microsphere (RETIN-A MICRO EX) Apply 1 application topically daily.     Historical Provider, MD  vitamin B-12 (CYANOCOBALAMIN) 50 MCG tablet Take 50 mcg by mouth daily.    Historical Provider, MD    ALLERGIES:  Allergies  Allergen Reactions  . Penicillins Rash    SOCIAL HISTORY:  History  Substance Use Topics  . Smoking status: Never Smoker   . Smokeless tobacco: Never Used  . Alcohol Use: 3.6 oz/week    6 Glasses of wine per week    FAMILY HISTORY: Family History  Problem Relation Age of Onset  . Multiple myeloma Mother   . Stroke Father   . Hypertension Father   . Heart disease Father 52  . Kidney disease Father   .  Cancer Father   . Cancer Maternal Grandmother     renal    EXAM: BP 115/66  Pulse 81  Temp(Src) 98.7 F (37.1 C) (Oral)  Resp 14  Ht 5' 10"  (1.778 m)  Wt 145 lb (65.772 kg)  BMI 20.81 kg/m2  SpO2 100%  LMP 09/15/1987 CONSTITUTIONAL: Alert and oriented and responds appropriately to questions. Well-appearing; well-nourished HEAD: Normocephalic EYES: Conjunctivae clear, PERRL ENT: normal nose; no rhinorrhea; moist mucous membranes; pharynx without lesions noted, clear nasal congestion, slightly hoarse voice, no trismus or drooling, no muffled voice, no stridor NECK: Supple, no meningismus, no LAD  CARD: RRR; S1 and S2 appreciated; systolic murmur, no clicks, no rubs, no gallops RESP: Normal chest excursion without splinting  or tachypnea; breath sounds clear and equal bilaterally; no wheezes, no rhonchi, no rales, no respiratory distress or hypoxia ABD/GI: Normal bowel sounds; non-distended; soft, non-tender, no rebound, no guarding BACK:  The back appears normal and is non-tender to palpation, there is no CVA tenderness EXT: Normal ROM in all joints; non-tender to palpation; no edema; normal capillary refill; no cyanosis    SKIN: Normal color for age and race; warm NEURO: Moves all extremities equally PSYCH: The patient's mood and manner are appropriate. Grooming and personal hygiene are appropriate.  MEDICAL DECISION MAKING: Patient here with likely viral URI. Labs are unremarkable. She is hemodynamically stable. Blood pressure 114/58. Chest x-ray clear. I do not feel she needs antibiotics at this time. Have recommended alternating Tylenol and Motrin for fever and pain. Have recommended rest and fluid intake. I feel she is safe to go home. She is comfortable with this plan. Discussed return precautions.       [place], DO 06/02/14 [id number]

## 2014-06-02 NOTE — Discharge Instructions (Signed)
May take Tylenol 1000 mg every 6 hours as needed for fever and pain. You may use saline nasal spray over-the-counter for nasal congestion and Afrin nasal spray. These medications will not affect her blood pressure.   Upper Respiratory Infection, Adult An upper respiratory infection (URI) is also sometimes known as the common cold. The upper respiratory tract includes the nose, sinuses, throat, trachea, and bronchi. Bronchi are the airways leading to the lungs. Most people improve within 1 week, but symptoms can last up to 2 weeks. A residual cough may last even longer.  CAUSES Many different viruses can infect the tissues lining the upper respiratory tract. The tissues become irritated and inflamed and often become very moist. Mucus production is also common. A cold is contagious. You can easily spread the virus to others by oral contact. This includes kissing, sharing a glass, coughing, or sneezing. Touching your mouth or nose and then touching a surface, which is then touched by another person, can also spread the virus. SYMPTOMS  Symptoms typically develop 1 to 3 days after you come in contact with a cold virus. Symptoms vary from person to person. They may include:  Runny nose.  Sneezing.  Nasal congestion.  Sinus irritation.  Sore throat.  Loss of voice (laryngitis).  Cough.  Fatigue.  Muscle aches.  Loss of appetite.  Headache.  Low-grade fever. DIAGNOSIS  You might diagnose your own cold based on familiar symptoms, since most people get a cold 2 to 3 times a year. Your caregiver can confirm this based on your exam. Most importantly, your caregiver can check that your symptoms are not due to another disease such as strep throat, sinusitis, pneumonia, asthma, or epiglottitis. Blood tests, throat tests, and X-rays are not necessary to diagnose a common cold, but they may sometimes be helpful in excluding other more serious diseases. Your caregiver will decide if any further  tests are required. RISKS AND COMPLICATIONS  You may be at risk for a more severe case of the common cold if you smoke cigarettes, have chronic heart disease (such as heart failure) or lung disease (such as asthma), or if you have a weakened immune system. The very young and very old are also at risk for more serious infections. Bacterial sinusitis, middle ear infections, and bacterial pneumonia can complicate the common cold. The common cold can worsen asthma and chronic obstructive pulmonary disease (COPD). Sometimes, these complications can require emergency medical care and may be life-threatening. PREVENTION  The best way to protect against getting a cold is to practice good hygiene. Avoid oral or hand contact with people with cold symptoms. Wash your hands often if contact occurs. There is no clear evidence that vitamin C, vitamin E, echinacea, or exercise reduces the chance of developing a cold. However, it is always recommended to get plenty of rest and practice good nutrition. TREATMENT  Treatment is directed at relieving symptoms. There is no cure. Antibiotics are not effective, because the infection is caused by a virus, not by bacteria. Treatment may include:  Increased fluid intake. Sports drinks offer valuable electrolytes, sugars, and fluids.  Breathing heated mist or steam (vaporizer or shower).  Eating chicken soup or other clear broths, and maintaining good nutrition.  Getting plenty of rest.  Using gargles or lozenges for comfort.  Controlling fevers with ibuprofen or acetaminophen as directed by your caregiver.  Increasing usage of your inhaler if you have asthma. Zinc gel and zinc lozenges, taken in the first 24 hours of the  common cold, can shorten the duration and lessen the severity of symptoms. Pain medicines may help with fever, muscle aches, and throat pain. A variety of non-prescription medicines are available to treat congestion and runny nose. Your caregiver can  make recommendations and may suggest nasal or lung inhalers for other symptoms.  °HOME CARE INSTRUCTIONS  °· Only take over-the-counter or prescription medicines for pain, discomfort, or fever as directed by your caregiver. °· Use a warm mist humidifier or inhale steam from a shower to increase air moisture. This may keep secretions moist and make it easier to breathe. °· Drink enough water and fluids to keep your urine clear or pale yellow. °· Rest as needed. °· Return to work when your temperature has returned to normal or as your caregiver advises. You may need to stay home longer to avoid infecting others. You can also use a face mask and careful hand washing to prevent spread of the virus. °SEEK MEDICAL CARE IF:  °· After the first few days, you feel you are getting worse rather than better. °· You need your caregiver's advice about medicines to control symptoms. °· You develop chills, worsening shortness of breath, or brown or red sputum. These may be signs of pneumonia. °· You develop yellow or brown nasal discharge or pain in the face, especially when you bend forward. These may be signs of sinusitis. °· You develop a fever, swollen neck glands, pain with swallowing, or white areas in the back of your throat. These may be signs of strep throat. °SEEK IMMEDIATE MEDICAL CARE IF:  °· You have a fever. °· You develop severe or persistent headache, ear pain, sinus pain, or chest pain. °· You develop wheezing, a prolonged cough, cough up blood, or have a change in your usual mucus (if you have chronic lung disease). °· You develop sore muscles or a stiff neck. °Document Released: 02/24/2001 Document Revised: 11/23/2011 Document Reviewed: 12/06/2013 °[employer]® Patient Information ©2015 [employer]. This information is not intended to replace advice given to you by your health care provider. Make sure you discuss any questions you have with your health care provider. ° °

## 2014-06-02 NOTE — ED Notes (Signed)
Dr. Leonides Schanz is at the bedside.

## 2014-06-02 NOTE — ED Notes (Signed)
Pt presents with generalized body aches, nasal congestion, and fever for the past 3 days.  Denies productive cough.  Pt temperature at home was 101- pt took 650mg  of Tylenol prior to arrival.  Denies pain.

## 2014-06-19 ENCOUNTER — Telehealth: Payer: Self-pay | Admitting: Cardiology

## 2014-06-19 ENCOUNTER — Other Ambulatory Visit: Payer: Self-pay | Admitting: Obstetrics & Gynecology

## 2014-06-19 NOTE — Telephone Encounter (Signed)
Returned call to patient she stated she received a email from [facility] about a chest xray that was done 06/02/14.Stated she did not understand email.Advised 06/02/14 cxr was ok.Advised to call in 11/15 to schedule follow up appt with Dr.Jordan and schedule echo 1 week before she is scheduled to see Dr.Jordan.

## 2014-06-19 NOTE — Telephone Encounter (Signed)
Please call,have questions about some test she thought she ws suppose to have and one she did not know she had.

## 2014-06-21 NOTE — Telephone Encounter (Signed)
Last AEX: 04/20/14 Last refill:04/20/13 #24 X 3 Current AEX: 07/06/14 Last MMG: 05/03/14 Bi-Rads Neg Please advise

## 2014-06-29 ENCOUNTER — Other Ambulatory Visit: Payer: Self-pay

## 2014-07-06 ENCOUNTER — Ambulatory Visit: Payer: Medicare Other | Admitting: Obstetrics & Gynecology

## 2014-07-06 ENCOUNTER — Ambulatory Visit (INDEPENDENT_AMBULATORY_CARE_PROVIDER_SITE_OTHER): Payer: Medicare Other | Admitting: Obstetrics & Gynecology

## 2014-07-06 ENCOUNTER — Encounter: Payer: Self-pay | Admitting: Obstetrics & Gynecology

## 2014-07-06 DIAGNOSIS — Z01419 Encounter for gynecological examination (general) (routine) without abnormal findings: Secondary | ICD-10-CM

## 2014-07-06 MED ORDER — ESTRADIOL 0.5 MG PO TABS: 0.5000 mg | ORAL_TABLET | Freq: Every day | ORAL | Status: DC

## 2014-07-06 NOTE — Progress Notes (Signed)
68 y.o. G0P0000 SingleCaucasianF here for annual exam.  No vaginal bleeding.  Working only 1/2 days.  Very frustrated with work situation.  Is looking for another job.  Went on a cruise in September.    Patient's last menstrual period was 09/15/1987.          Sexually active: Yes.   to a degree The current method of family planning is status post hysterectomy.    Exercising: Yes.    walking Smoker:  no  Health Maintenance: Pap:  04/20/13 WNL History of abnormal Pap:  yes MMG:  05/03/14 3D-normal Colonoscopy:  2015, Dr. Earlean Shawl, repeat in ? 5 years BMD:   4/12 TDaP:  2008 Screening Labs: PCP, Hb today: PCP, Urine today: PCP   reports that she has never smoked. She has never used smokeless tobacco. She reports that she drinks alcohol. She reports that she does not use illicit drugs.  Past Medical History  Diagnosis Date  . Seizures     as a child  . Bicuspid aortic valve   . Aortic stenosis   . Leukopenia     Seen by Dr. Lamonte Sakai, present since 2008, watchful waiting  . Gout   . Osteopenia   . Abnormal Pap smear   . Anxiety   . Heart murmur   . Heart valve problem     will have follow up Echo-Dr Jordan-will have replacement in a couple of years    Past Surgical History  Procedure Laterality Date  . Oophorectomy    . Partial hysterectomy    . Gum surgery    . Tubal ligation    . Cervical cone biopsy    . Vaginal hysterectomy      for dysplasia  . Colonoscopy  2010  . Doppler echocardiography  3/14    Current Outpatient Prescriptions  Medication Sig Dispense Refill  . Ascorbic Acid (VITAMIN C) 1000 MG tablet Take 1,000 mg by mouth daily.      Marland Kitchen b complex vitamins tablet Take 1 tablet by mouth daily.      . Calcium Carbonate-Vitamin D (CALTRATE 600+D PO) Take 1 tablet by mouth 2 (two) times daily.      . cholecalciferol (VITAMIN D) 1000 UNITS tablet Take 1,000 Units by mouth daily.       . Echinacea 125 MG CAPS Take 125 mg by mouth daily.       Marland Kitchen estradiol (ESTRACE) 0.5 MG  tablet Take 0.5 mg by mouth daily.      . Glucosamine-Chondroitin (OSTEO BI-FLEX REGULAR STRENGTH PO) Take 1 tablet by mouth 2 (two) times daily.       Marland Kitchen guaiFENesin-codeine 100-10 MG/5ML syrup Take 5 mLs by mouth every 6 (six) hours as needed for cough.  75 mL  0  . LOTEMAX 0.5 % GEL Place 1 drop into the right eye daily as needed (for allergy eye pain).       . Multiple Vitamin (MULITIVITAMIN WITH MINERALS) TABS Take 1 tablet by mouth daily.      . Omega-3 Fatty Acids (OMEGA-3 FISH OIL PO) Take 1 capsule by mouth 2 (two) times daily.       . pravastatin (PRAVACHOL) 80 MG tablet Take 80 mg by mouth daily.      . primidone (MYSOLINE) 250 MG tablet Take 250 mg by mouth 2 (two) times daily.      . temazepam (RESTORIL) 30 MG capsule Take 30 mg by mouth at bedtime as needed. For sleep      .  Tretinoin Microsphere (RETIN-A MICRO EX) Apply 1 application topically daily.       Marland Kitchen VAGIFEM 10 MCG TABS vaginal tablet INSERT 1 TABLET VAGINALLY 2 TIMES A WEEK  24 tablet  0  . vitamin B-12 (CYANOCOBALAMIN) 50 MCG tablet Take 50 mcg by mouth daily.       No current facility-administered medications for this visit.    Family History  Problem Relation Age of Onset  . Multiple myeloma Mother   . Stroke Father   . Hypertension Father   . Heart disease Father 43  . Kidney disease Father   . Cancer Father   . Cancer Maternal Grandmother     renal    ROS:  Pertinent items are noted in HPI.  Otherwise, a comprehensive ROS was negative.  Exam:   LMP 09/15/1987      Ht Readings from Last 3 Encounters:  06/02/14 5' 10"  (1.778 m)  05/02/14 5' 9"  (1.753 m)  04/20/13 5' 7.75" (1.721 m)    General appearance: alert, cooperative and appears stated age Head: Normocephalic, without obvious abnormality, atraumatic Neck: no adenopathy, supple, symmetrical, trachea midline and thyroid normal to inspection and palpation Lungs: clear to auscultation bilaterally Breasts: normal appearance, no masses or  tenderness Heart: regular rate and rhythm Abdomen: soft, non-tender; bowel sounds normal; no masses,  no organomegaly Extremities: extremities normal, atraumatic, no cyanosis or edema Skin: Skin color, texture, turgor normal. No rashes or lesions Lymph nodes: Cervical, supraclavicular, and axillary nodes normal. No abnormal inguinal nodes palpated Neurologic: Grossly normal   Pelvic: External genitalia:  no lesions              Urethra:  normal appearing urethra with no masses, tenderness or lesions              Bartholins and Skenes: normal                 Vagina: normal appearing vagina with normal color and discharge, no lesions              Cervix: absent              Pap taken: No. Bimanual Exam:  Uterus:  uterus absent              Adnexa: normal adnexa and no mass, fullness, tenderness               Rectovaginal: Confirms               Anus:  normal sphincter tone, no lesions  A:  Well Woman with normal exam  P:   Well Woman with normal exam Remote hx of dysplasia, s/p TVH  S/P laparoscopic BSO 9/08  Osteopenia  H/O gout  Vaginal atrophic changes   P: Mammogram yearly.  pap smear today. Desires every other year. D/w guidelines, still desires.  Change to estradiol 0.73m due to cost. #90/4RF.  Pt will stop vagifem after using this current rx.  Doesn't like having to place vaginally.   On drug holiday from Fosamax. BMD next year with MMG.  Pt knows to schedule.  return annually or prn  An After Visit Summary was printed and given to the patient.

## 2014-09-27 ENCOUNTER — Encounter: Payer: Self-pay | Admitting: Cardiology

## 2014-10-22 ENCOUNTER — Ambulatory Visit (HOSPITAL_COMMUNITY)
Admission: RE | Admit: 2014-10-22 | Discharge: 2014-10-22 | Disposition: A | Discharge status: Home / Self Care | Payer: PPO | Source: Ambulatory Visit | Attending: Cardiology | Admitting: Cardiology

## 2014-10-22 DIAGNOSIS — I350 Nonrheumatic aortic (valve) stenosis: Secondary | ICD-10-CM | POA: Diagnosis not present

## 2014-10-22 DIAGNOSIS — E785 Hyperlipidemia, unspecified: Secondary | ICD-10-CM | POA: Diagnosis not present

## 2014-10-22 DIAGNOSIS — Q231 Congenital insufficiency of aortic valve: Secondary | ICD-10-CM

## 2014-10-22 NOTE — Progress Notes (Signed)
2D Echo Performed 10/22/2014    Arnett Duddy, RCS  

## 2014-10-29 ENCOUNTER — Encounter: Payer: Self-pay | Admitting: Cardiology

## 2014-10-29 ENCOUNTER — Ambulatory Visit (INDEPENDENT_AMBULATORY_CARE_PROVIDER_SITE_OTHER): Payer: PPO | Admitting: Cardiology

## 2014-10-29 VITALS — BP 139/81 | HR 69 | Ht 69.0 in | Wt 142.6 lb

## 2014-10-29 DIAGNOSIS — Q231 Congenital insufficiency of aortic valve: Secondary | ICD-10-CM

## 2014-10-29 DIAGNOSIS — I350 Nonrheumatic aortic (valve) stenosis: Secondary | ICD-10-CM

## 2014-10-29 NOTE — Patient Instructions (Signed)
We will see you in six months with a repeat Echo.  Let me know if you develop symptoms of SOB, chest pain, or dizziness.

## 2014-10-29 NOTE — Progress Notes (Signed)
[name] Date of Birth: Jan 21, 1946 Medical Record #[id number]  History of Present Illness: Dhanya is seen today for followup of aortic stenosis. She  has a history of bicuspid aortic valve with aortic stenosis. Evaluation in 2010 with echocardiogram showed a mean gradient of 14 mmHg consistent with mild aortic stenosis. Echo in May 2014 showed a mean gradient of 25 with moderate AS. In August 2015 Echo showed increase in valve mean gradient to 46 mm Hg with valve area of .97 cm2. Peak gradient was 69 mm Hg. She states she is doing very well. No chest pain, SOB, palpitations, or dizziness. She remains active walking daily. She states she usually walks one mile when the weather is good and on a treadmill when the weather is bad.   Current Outpatient Prescriptions on File Prior to Visit  Medication Sig Dispense Refill  . Ascorbic Acid (VITAMIN C) 1000 MG tablet Take 1,000 mg by mouth daily.    Marland Kitchen b complex vitamins tablet Take 1 tablet by mouth daily.    . Calcium Carbonate-Vitamin D (CALTRATE 600+D PO) Take 1 tablet by mouth 2 (two) times daily.    . Echinacea 125 MG CAPS Take 125 mg by mouth daily.     Marland Kitchen estradiol (ESTRACE) 0.5 MG tablet Take 1 tablet (0.5 mg total) by mouth daily. 90 tablet 4  . Glucosamine-Chondroitin (OSTEO BI-FLEX REGULAR STRENGTH PO) Take 1 tablet by mouth 2 (two) times daily.     Marland Kitchen LOTEMAX 0.5 % GEL Place 1 drop into the right eye daily as needed (for allergy eye pain).     . Multiple Vitamin (MULITIVITAMIN WITH MINERALS) TABS Take 1 tablet by mouth daily.    . Omega-3 Fatty Acids (OMEGA-3 FISH OIL PO) Take 1 capsule by mouth 2 (two) times daily.     . primidone (MYSOLINE) 250 MG tablet Take 250 mg by mouth 2 (two) times daily.    . temazepam (RESTORIL) 30 MG capsule Take 30 mg by mouth at bedtime as needed. For sleep    . Tretinoin Microsphere (RETIN-A MICRO EX) Apply 1 application topically daily.     Marland Kitchen VAGIFEM 10 MCG TABS vaginal tablet INSERT 1 TABLET VAGINALLY  2 TIMES A WEEK 24 tablet 0  . vitamin B-12 (CYANOCOBALAMIN) 50 MCG tablet Take 50 mcg by mouth daily.    . pravastatin (PRAVACHOL) 80 MG tablet Take 80 mg by mouth daily.     No current facility-administered medications on file prior to visit.    Allergies  Allergen Reactions  . Penicillins Rash    Past Medical History  Diagnosis Date  . Seizures     as a child  . Bicuspid aortic valve   . Aortic stenosis   . Leukopenia     Seen by Dr. Lamonte Sakai, present since 2008, watchful waiting  . Gout   . Osteopenia   . Abnormal Pap smear   . Anxiety   . Heart murmur   . Heart valve problem     will have follow up Echo-Dr Sherleen Pangborn-will have replacement in a couple of years    Past Surgical History  Procedure Laterality Date  . Oophorectomy    . Partial hysterectomy    . Gum surgery    . Tubal ligation    . Cervical cone biopsy    . Vaginal hysterectomy      for dysplasia  . Colonoscopy  2010  . Doppler echocardiography  3/14    History  Smoking status  .  Never Smoker   Smokeless tobacco  . Never Used    History  Alcohol Use  . Yes    Comment: 2 glasses of wine/cranberry juice a day    Family History  Problem Relation Age of Onset  . Multiple myeloma Mother   . Stroke Father   . Hypertension Father   . Heart disease Father 60  . Kidney disease Father   . Cancer Father   . Cancer Maternal Grandmother     renal    Review of Systems: The review of systems is positive for chronic anxiety.  All other systems were reviewed and are negative.  Physical Exam: BP 139/81 mmHg  Pulse 69  Ht 5' 9"  (1.753 m)  Wt 142 lb 9.6 oz (64.683 kg)  BMI 21.05 kg/m2  LMP 09/15/1987 She is a pleasant white female in no acute distress. HEENT: Normocephalic, atraumatic. Pupils equal round and reactive to light and accommodation. Extraocular movements are full. Oropharynx is clear. Neck: No JVD, adenopathy, thyromegaly, or bruits. Lungs: Clear Cardiovascular: Regular rate and rhythm.  Normal S1 and S2. Grade 3/6 systolic murmur heard best at right upper sternal border radiating to the carotids and apex. Abdomen: Soft and nontender. No masses or bruits. Bowel sounds positive. Extremities: No cyanosis or edema. Pulses are 2+ and symmetric. Skin: Warm and dry Neuro: Alert and oriented x3. Cranial nerves II through XII are intact. Is appropriate.  LABORATORY DATA:    Echo:10/22/14:Transthoracic Echocardiography  Patient:  Nicole Hess, Gasiorowski MR #:    [id number] Study Date: 10/22/2014 Gender:   F Age:    20 Height:   177.8 cm Weight:   59 kg BSA:    1.69 m^2 Pt. Status: Room:  ATTENDING  Miski Feldpausch Martinique, M.D. ORDERING   Renn Stille [place], M.D. REFERRING  Ruta Capece [place], M.D. SONOGRAPHER [name], RCS PERFORMING  [facility], Outpatient  cc:  ------------------------------------------------------------------- LV EF: 60% -  65%  ------------------------------------------------------------------- Indications:   424.1 Aortic valve disorders.  ------------------------------------------------------------------- History:  PMH: Hyperlipidemia.  ------------------------------------------------------------------- Study Conclusions  - Left ventricle: The cavity size was normal. Wall thickness was normal. Systolic function was normal. The estimated ejection fraction was in the range of 60% to 65%. - Aortic valve: Bicuspid; moderately thickened, moderately calcified leaflets. Valve mobility was severely restricted. There was severe stenosis. Valve area (VTI): 0.85 cm^2. Valve area (Vmax): 0.86 cm^2.  Transthoracic echocardiography. M-mode, complete 2D, spectral Doppler, and color Doppler. Birthdate: Patient birthdate: August 04, 1946. Age: Patient is 69 yr old. Sex: Gender: female. BMI: 18.7 kg/m^2. Blood pressure:   122/71 Patient status: Outpatient. Study date: Study date: 10/22/2014. Study time: 02:30 PM. Location:  Echo laboratory.  -------------------------------------------------------------------  ------------------------------------------------------------------- Left ventricle: The cavity size was normal. Wall thickness was normal. Systolic function was normal. The estimated ejection fraction was in the range of 60% to 65%.  ------------------------------------------------------------------- Aortic valve: ONly the peak gradient was provided. the Mean gradient was not provided. THe valve appears to be severely stenotic. Bicuspid; moderately thickened, moderately calcified leaflets. Valve mobility was severely restricted. Doppler:  There was severe stenosis.   Valve area (VTI): 0.85 cm^2. Indexed valve area (VTI): 0.5 cm^2/m^2. Valve area (Vmax): 0.86 cm^2. Indexed valve area (Vmax): 0.51 cm^2/m^2.  Peak gradient (S): 73 mm Hg.  ------------------------------------------------------------------- Mitral valve:  Structurally normal valve.  Leaflet separation was normal. Doppler: Transvalvular velocity was within the normal range. There was no evidence for stenosis. There was no regurgitation.  Peak gradient (D): 3 mm Hg.  ------------------------------------------------------------------- Left atrium: The atrium was  normal in size.  ------------------------------------------------------------------- Right ventricle: The cavity size was normal. Systolic function was normal.  ------------------------------------------------------------------- Pulmonic valve:  The valve appears to be grossly normal. Doppler: There was no significant regurgitation.  ------------------------------------------------------------------- Tricuspid valve:  Structurally normal valve.  Leaflet separation was normal. Doppler: Transvalvular velocity was within the normal range. There was trivial regurgitation.  ------------------------------------------------------------------- Pulmonary  artery:  Systolic pressure was within the normal range.  ------------------------------------------------------------------- Right atrium: The atrium was normal in size.  ------------------------------------------------------------------- Pericardium: The pericardium was normal in appearance.  ------------------------------------------------------------------- Systemic veins: Inferior vena cava: The vessel was normal in size. The respirophasic diameter changes were in the normal range (= 50%), consistent with normal central venous pressure. Diameter: 15 mm.  ------------------------------------------------------------------- Measurements  IVC                    Value     Reference ID                    15  mm    ---------  Left ventricle              Value     Reference Stroke volume, 2D             91  ml    --------- Stroke volume/bsa, 2D           54  ml/m^2  --------- LV e&', lateral              10.2 cm/s   --------- LV E/e&', lateral             8.18      --------- LV e&', medial               8.66 cm/s   --------- LV E/e&', medial              9.63      --------- LV e&', average              9.43 cm/s   --------- LV E/e&', average             8.84      ---------  LVOT                   Value     Reference LVOT ID, S                20  mm    --------- LVOT area                 3.14 cm^2   --------- LVOT peak velocity, S           117  cm/s   --------- LVOT mean velocity, S           78.8 cm/s   --------- LVOT VTI, S                29.1 cm    --------- LVOT peak gradient, S           5   mm Hg  ---------  Aortic valve                Value     Reference Aortic peak gradient, S          73  mm Hg  --------- Aortic valve area, VTI          0.85 cm^2   --------- Aortic valve area/bsa, VTI  0.5  cm^2/m^2 --------- Aortic valve area, peak velocity     0.86 cm^2   --------- Aortic valve area/bsa, peak        0.51 cm^2/m^2 --------- velocity  Left atrium                Value     Reference LA volume, S               40  ml    --------- LA volume/bsa, S             23.6 ml/m^2  --------- LA volume, ES, 1-p A4C          34  ml    --------- LA volume/bsa, ES, 1-p A4C        20.1 ml/m^2  --------- LA volume, ES, 1-p A2C          47  ml    --------- LA volume/bsa, ES, 1-p A2C        27.7 ml/m^2  ---------  Mitral valve               Value     Reference Mitral E-wave peak velocity        83.4 cm/s   --------- Mitral A-wave peak velocity        64.2 cm/s   --------- Mitral deceleration time         211  ms    150 - 230 Mitral peak gradient, D          3   mm Hg  --------- Mitral E/A ratio, peak          1.3      ---------  Pulmonary arteries            Value     Reference PA pressure, S, DP            29  mm Hg  <=30  Tricuspid valve              Value     Reference Tricuspid regurg peak velocity      254  cm/s   --------- Tricuspid peak RV-RA gradient       26  mm Hg  --------- Tricuspid maximal regurg velocity,    254  cm/s   --------- PISA  Systemic veins              Value     Reference Estimated CVP               3   mm Hg  ---------  Right ventricle              Value     Reference RV  pressure, S, DP            29  mm Hg  <=30 RV s&', lateral, S             13  cm/s   ---------  Legend: (L) and (H) mark values outside specified reference range.  ------------------------------------------------------------------- Prepared and Electronically Authenticated by  Mertie Moores, M.D.  Assessment / Plan: 1. Severe aortic stenosis with bicuspid aortic valve. Aortic root appears normal in size. There has been progression of her gradient since 2010 and again since 2014. Relatively stable since last August although mean gradient not measured. She is asymptomatic. Will repeat surveillance with Echo in 6 months. She is to call if she develops any symptoms. Given progression I think it is likely that she  will need AVR in the next 2 years.

## 2014-10-31 ENCOUNTER — Telehealth: Payer: Self-pay

## 2014-10-31 MED ORDER — ESTRADIOL 0.5 MG PO TABS: 0.5000 mg | ORAL_TABLET | Freq: Every day | ORAL | Status: DC

## 2014-10-31 NOTE — Telephone Encounter (Signed)
Spoke with patient. Advised received form from insurance company stating that Estradiol 0.5mg  tablets are no longer a covered medication. Advised patient rx can be sent to [facility] or [facility] as it is on the 4 dollar medication list. Patient is agreeable. Rx for Estradiol 0.5mg  transferred to [facility] off [address]. Patient is agreeable. Aware will need to pay cash pay for prescription. Patient is agreeable.  Routing to provider for final review. Patient agreeable to disposition. Will close encounter

## 2014-11-03 ENCOUNTER — Other Ambulatory Visit: Payer: Self-pay | Admitting: Obstetrics & Gynecology

## 2014-11-05 NOTE — Telephone Encounter (Signed)
Received call back from patient.Advised if you want a copy of your chart will need to come by office and sign a release.Advised charge will depend on number of pages.Also Dr.Jordan received your letter.He advised you have aortic stenosis.He advised aortic valve mean gradient increased and will need follow up echo in 6 months.Echo already scheduled 04/24/15 at 2:00 pm.Advised if you develop sob,chest pain,dizziness to call.Advised your letter stated replacing with stents.You will need a AVR in future.No stents.Also your father had AVR.Stated she was doing good no chest pain,no sob,no dizziness.Advised to call sooner if needed.

## 2014-11-05 NOTE — Telephone Encounter (Signed)
Patient called no answer.Left message on personal voice mail received your email last week, calling you back to discuss.Advised to call me back.

## 2014-11-05 NOTE — Telephone Encounter (Signed)
Medication refill request: Vagifem 10 mg tab Last AEX:  07/06/14 SM Next AEX: 09/17/15 SM Last MMG (if hormonal medication request): 05/03/14 [id number] Refill authorized: 06/21/14 #[id number]. Today?    Routed to Dr. Quincy Simmonds - [name] out of the office.

## 2014-11-08 ENCOUNTER — Telehealth: Payer: Self-pay | Admitting: Obstetrics & Gynecology

## 2014-11-08 NOTE — Telephone Encounter (Signed)
Spoke with patient. She would like to know if other options are available for vaginal estrogens other than Vagifem.   Patient states that cost of vagifem is $83.00 and that she really likes using it and is seeing the benefits but it is costly. Patient  States she has new [employer] plan. Advised patient that she will need to call [employer] and find out what tier coverage that the vagifem is on and if there are any other options of vaginal estrogen that are lower tiered.   Patient will call back if she is finds out any additional information regarding coverage.  Routing to provider for final review. Patient agreeable to disposition. Will close encounter

## 2014-11-08 NOTE — Telephone Encounter (Signed)
Patient called and left a message at lunch requesting a nurse call her to discuss her questions about Vagifem.

## 2014-12-16 ENCOUNTER — Encounter: Payer: Self-pay | Admitting: Obstetrics & Gynecology

## 2014-12-17 ENCOUNTER — Other Ambulatory Visit: Payer: Self-pay | Admitting: Obstetrics & Gynecology

## 2014-12-17 MED ORDER — ESTRADIOL 10 MCG VA TABS: ORAL_TABLET | VAGINAL | Status: DC

## 2014-12-31 ENCOUNTER — Encounter: Payer: Self-pay | Admitting: Cardiology

## 2015-01-04 ENCOUNTER — Encounter: Payer: Self-pay | Admitting: Cardiology

## 2015-01-11 ENCOUNTER — Telehealth: Payer: Self-pay | Admitting: *Deleted

## 2015-01-11 NOTE — Telephone Encounter (Signed)
Patient walked into the office today. She is concerned about the fatigue she is getting by the end of the week. 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  The job is stressful and demanding. She walks daily and climbs 5 flights of steps regularly. She denies any chest or SOB with exertion. She does not have any symptoms of fatigue on the weekend. Reassurance given to the patient. She is going to look into options regarding work as a cause of her fatigue.

## 2015-01-12 ENCOUNTER — Encounter: Payer: Self-pay | Admitting: Obstetrics & Gynecology

## 2015-01-12 ENCOUNTER — Encounter: Payer: Self-pay | Admitting: Cardiology

## 2015-01-12 DIAGNOSIS — I350 Nonrheumatic aortic (valve) stenosis: Secondary | ICD-10-CM

## 2015-01-12 DIAGNOSIS — R531 Weakness: Secondary | ICD-10-CM

## 2015-01-14 ENCOUNTER — Telehealth: Payer: Self-pay | Admitting: Emergency Medicine

## 2015-01-14 NOTE — Telephone Encounter (Signed)
Chief Complaint  Patient presents with  . Advice Only    Telephone call in response to [employer].    Called patient. Advised received mychart message below and nurse reviews all messages. Advised  I wanted to clarify message being sent to [name]. Patient states "This was an FYI only and I don't need anything." Patient states she has been having fatigue and is working with Dr. Doug Sou office and will have lab work done tomorrow. Advised patient will send message to [name] as Juluis Rainier. Patient appreciative.   Routing to provider for final review. Patient agreeable to disposition. Will close encounter

## 2015-01-14 NOTE — Telephone Encounter (Signed)
Telephone encounter created in response to [employer].

## 2015-01-14 NOTE — Telephone Encounter (Signed)
[name], I have written to Dr. Martinique relative to my recent heart experiences. 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 On each of the last 2 Friday's I have been very tired. This past Friday I went to see Dr. Martinique. He was not in, but his PA saw me, and I explained what I am expected to do in a 4 hr. work day. She said 'you need to retire'. I have also written to Dr. Martinique letting him know about what was said, and should I retire. Frankly, I would like to. Nicole Hess

## 2015-01-14 NOTE — Telephone Encounter (Signed)
Spoke to patient Dr.Jordan advised complete lab work,cbc,cmet,tsh,lipids,b12.Advised if these test unremarkable he recommends to proceed with evaluation for AV replacement.He advised will need to do a rt and lf cardiac cath.Stated she will have fasting lab tomorrow 01/15/15.

## 2015-01-15 ENCOUNTER — Other Ambulatory Visit: Payer: Self-pay

## 2015-01-15 ENCOUNTER — Other Ambulatory Visit: Payer: Self-pay | Admitting: Cardiology

## 2015-01-15 DIAGNOSIS — R531 Weakness: Secondary | ICD-10-CM

## 2015-01-15 DIAGNOSIS — I350 Nonrheumatic aortic (valve) stenosis: Secondary | ICD-10-CM

## 2015-01-15 LAB — CBC WITH DIFFERENTIAL/PLATELET
BASOS ABS: 0 10*3/uL (ref 0.0–0.1)
BASOS PCT: 0 % (ref 0–1)
Eosinophils Absolute: 0.1 10*3/uL (ref 0.0–0.7)
Eosinophils Relative: 2 % (ref 0–5)
HCT: 37.1 % (ref 36.0–46.0)
Hemoglobin: 12.6 g/dL (ref 12.0–15.0)
Lymphocytes Relative: 44 % (ref 12–46)
Lymphs Abs: 1.5 10*3/uL (ref 0.7–4.0)
MCH: 31.7 pg (ref 26.0–34.0)
MCHC: 34 g/dL (ref 30.0–36.0)
MCV: 93.2 fL (ref 78.0–100.0)
MONOS PCT: 8 % (ref 3–12)
MPV: 11.1 fL (ref 8.6–12.4)
Monocytes Absolute: 0.3 10*3/uL (ref 0.1–1.0)
NEUTROS PCT: 46 % (ref 43–77)
Neutro Abs: 1.5 10*3/uL — ABNORMAL LOW (ref 1.7–7.7)
PLATELETS: 164 10*3/uL (ref 150–400)
RBC: 3.98 MIL/uL (ref 3.87–5.11)
RDW: 13.6 % (ref 11.5–15.5)
WBC: 3.3 10*3/uL — AB (ref 4.0–10.5)

## 2015-01-16 LAB — COMPREHENSIVE METABOLIC PANEL
ALBUMIN: 4.2 g/dL (ref 3.5–5.2)
ALT: 30 U/L (ref 0–35)
AST: 36 U/L (ref 0–37)
Alkaline Phosphatase: 94 U/L (ref 39–117)
BUN: 22 mg/dL (ref 6–23)
CALCIUM: 9.2 mg/dL (ref 8.4–10.5)
CHLORIDE: 103 meq/L (ref 96–112)
CO2: 31 meq/L (ref 19–32)
CREATININE: 0.75 mg/dL (ref 0.50–1.10)
Glucose, Bld: 94 mg/dL (ref 70–99)
Potassium: 4.1 mEq/L (ref 3.5–5.3)
Sodium: 140 mEq/L (ref 135–145)
TOTAL PROTEIN: 7.2 g/dL (ref 6.0–8.3)
Total Bilirubin: 0.4 mg/dL (ref 0.2–1.2)

## 2015-01-16 LAB — LIPID PANEL
Cholesterol: 200 mg/dL (ref 0–200)
HDL: 84 mg/dL (ref >=46)
LDL Cholesterol: 100 mg/dL — ABNORMAL HIGH (ref 0–99)
Total CHOL/HDL Ratio: 2.4 Ratio
Triglycerides: 81 mg/dL (ref <150)
VLDL: 16 mg/dL (ref 0–40)

## 2015-01-16 LAB — TSH: TSH: 1.56 u[IU]/mL (ref 0.350–4.500)

## 2015-01-16 LAB — VITAMIN B12: Vitamin B-12: 860 pg/mL (ref 211–911)

## 2015-01-17 ENCOUNTER — Encounter: Payer: Self-pay | Admitting: Obstetrics & Gynecology

## 2015-01-17 ENCOUNTER — Encounter: Payer: Self-pay | Admitting: Cardiology

## 2015-01-17 NOTE — Telephone Encounter (Signed)
Responded to patient via mychart.  Routing to provider for final review.  Will close encounter.

## 2015-01-18 ENCOUNTER — Other Ambulatory Visit: Payer: Self-pay

## 2015-01-18 DIAGNOSIS — Z01812 Encounter for preprocedural laboratory examination: Secondary | ICD-10-CM

## 2015-01-18 DIAGNOSIS — I350 Nonrheumatic aortic (valve) stenosis: Secondary | ICD-10-CM

## 2015-01-22 ENCOUNTER — Encounter: Payer: Self-pay | Admitting: Cardiology

## 2015-01-22 ENCOUNTER — Other Ambulatory Visit: Payer: Self-pay | Admitting: Cardiology

## 2015-01-22 ENCOUNTER — Ambulatory Visit (INDEPENDENT_AMBULATORY_CARE_PROVIDER_SITE_OTHER): Payer: PPO | Admitting: Cardiology

## 2015-01-22 VITALS — BP 130/72 | HR 76 | Ht 69.0 in | Wt 137.5 lb

## 2015-01-22 DIAGNOSIS — E785 Hyperlipidemia, unspecified: Secondary | ICD-10-CM | POA: Diagnosis not present

## 2015-01-22 DIAGNOSIS — I350 Nonrheumatic aortic (valve) stenosis: Secondary | ICD-10-CM

## 2015-01-22 DIAGNOSIS — Q231 Congenital insufficiency of aortic valve: Secondary | ICD-10-CM

## 2015-01-22 NOTE — Progress Notes (Signed)
Nicole Hess Date of Birth: 1946-05-11 Medical Record #[id number]  History of Present Illness: Nicole Hess is seen today for followup of aortic stenosis. She  has a history of bicuspid aortic valve with aortic stenosis. Evaluation in 2010 with echocardiogram showed a mean gradient of 14 mmHg consistent with mild aortic stenosis. Echo in May 2014 showed a mean gradient of 25 with moderate AS. In August 2015 Echo showed increase in valve mean gradient to 46 mm Hg with valve area of .97 cm2. Peak gradient was 69 mm Hg. Recently she experienced symptoms of  Fatigue. Lab work showed no reason for this. No chest pain, SOB, palpitations, or dizziness. She does walking daily. She is under a lot of stress at work.  Current Outpatient Prescriptions on File Prior to Visit  Medication Sig Dispense Refill  . Ascorbic Acid (VITAMIN C) 1000 MG tablet Take 1,000 mg by mouth daily.    Marland Kitchen b complex vitamins tablet Take 1 tablet by mouth daily.    . Calcium Carbonate-Vitamin D (CALTRATE 600+D PO) Take 1 tablet by mouth 2 (two) times daily.    . Echinacea 125 MG CAPS Take 125 mg by mouth daily.     Marland Kitchen estradiol (ESTRACE) 0.5 MG tablet Take 1 tablet (0.5 mg total) by mouth daily. 90 tablet 1  . Estradiol (VAGIFEM) 10 MCG TABS vaginal tablet INSERT 1 TABLET VAGINALLY 2 TIMES A WEEK 24 tablet 4  . Glucosamine-Chondroitin (OSTEO BI-FLEX REGULAR STRENGTH PO) Take 1 tablet by mouth 2 (two) times daily.     . Multiple Vitamin (MULITIVITAMIN WITH MINERALS) TABS Take 1 tablet by mouth daily.    . Omega-3 Fatty Acids (OMEGA-3 FISH OIL PO) Take 1 capsule by mouth 2 (two) times daily.     . pravastatin (PRAVACHOL) 80 MG tablet Take 80 mg by mouth daily.    . primidone (MYSOLINE) 250 MG tablet Take 250 mg by mouth 2 (two) times daily.    . temazepam (RESTORIL) 30 MG capsule Take 30 mg by mouth at bedtime as needed. For sleep    . Tretinoin Microsphere (RETIN-A MICRO EX) Apply 1 application topically daily.     . vitamin B-12  (CYANOCOBALAMIN) 50 MCG tablet Take 50 mcg by mouth daily.     No current facility-administered medications on file prior to visit.    Allergies  Allergen Reactions  . Penicillins Rash    Past Medical History  Diagnosis Date  . Seizures     as a child  . Bicuspid aortic valve   . Aortic stenosis   . Leukopenia     Seen by Dr. Lamonte Sakai, present since 2008, watchful waiting  . Gout   . Osteopenia   . Abnormal Pap smear   . Anxiety   . Heart murmur   . Heart valve problem     will have follow up Echo-Dr Jordan-will have replacement in a couple of years    Past Surgical History  Procedure Laterality Date  . Oophorectomy    . Partial hysterectomy    . Gum surgery    . Tubal ligation    . Cervical cone biopsy    . Vaginal hysterectomy      for dysplasia  . Colonoscopy  2010  . Doppler echocardiography  3/14    History  Smoking status  . Never Smoker   Smokeless tobacco  . Never Used    History  Alcohol Use  . Yes    Comment: 2 glasses of wine/cranberry juice  a day    Family History  Problem Relation Age of Onset  . Multiple myeloma Mother   . Stroke Father   . Hypertension Father   . Heart disease Father 76  . Kidney disease Father   . Cancer Father   . Cancer Maternal Grandmother     renal    Review of Systems: The review of systems is positive for chronic anxiety.  All other systems were reviewed and are negative.  Physical Exam: BP 130/72 mmHg  Pulse 76  Ht 5' 9"  (1.753 m)  Wt 137 lb 8 oz (62.37 kg)  BMI 20.30 kg/m2  LMP 09/15/1987 She is a pleasant white female in no acute distress. HEENT: Normocephalic, atraumatic. Pupils equal round and reactive to light and accommodation. Extraocular movements are full. Oropharynx is clear. Neck: No JVD, adenopathy, thyromegaly, or bruits. Lungs: Clear Cardiovascular: Regular rate and rhythm. Normal S1 and S2. Grade 3/6 systolic murmur heard best at right upper sternal border radiating to the carotids and  apex. Abdomen: Soft and nontender. No masses or bruits. Bowel sounds positive. Extremities: No cyanosis or edema. Pulses are 2+ and symmetric. Skin: Warm and dry Neuro: Alert and oriented x3. Cranial nerves II through XII are intact. Is appropriate.  LABORATORY DATA: Lab Results  Component Value Date   WBC 3.3* 01/15/2015   HGB 12.6 01/15/2015   HCT 37.1 01/15/2015   PLT 164 01/15/2015   GLUCOSE 94 01/15/2015   CHOL 200 01/15/2015   TRIG 81 01/15/2015   HDL 84 01/15/2015   LDLCALC 100* 01/15/2015   ALT 30 01/15/2015   AST 36 01/15/2015   NA 140 01/15/2015   K 4.1 01/15/2015   CL 103 01/15/2015   CREATININE 0.75 01/15/2015   BUN 22 01/15/2015   CO2 31 01/15/2015   TSH 1.560 01/15/2015    CHEST 2 VIEW  COMPARISON: None.  FINDINGS: Normal mediastinum and cardiac silhouette. Normal pulmonary vasculature. No evidence of effusion, infiltrate, or pneumothorax. No acute bony abnormality.  IMPRESSION: No acute cardiopulmonary process.   Electronically Signed  By: Suzy Bouchard M.D.  On: 06/02/2014 21:05   Echo:10/22/14:Transthoracic Echocardiography  Patient:  Nicole Hess MR #:    [id number] Study Date: 10/22/2014 Gender:   F Age:    69 Height:   177.8 cm Weight:   59 kg BSA:    1.69 m^2 Pt. Status: Room:  ATTENDING  Peter Martinique, M.D. ORDERING   Peter Martinique, M.D. REFERRING  Peter Martinique, M.D. SONOGRAPHER [name], RCS PERFORMING  [facility], Outpatient  cc:  ------------------------------------------------------------------- LV EF: 60% -  65%  ------------------------------------------------------------------- Indications:   424.1 Aortic valve disorders.  ------------------------------------------------------------------- History:  PMH: Hyperlipidemia.  ------------------------------------------------------------------- Study Conclusions  - Left ventricle: The cavity size was normal. Wall  thickness was normal. Systolic function was normal. The estimated ejection fraction was in the range of 60% to 65%. - Aortic valve: Bicuspid; moderately thickened, moderately calcified leaflets. Valve mobility was severely restricted. There was severe stenosis. Valve area (VTI): 0.85 cm^2. Valve area (Vmax): 0.86 cm^2.  Transthoracic echocardiography. M-mode, complete 2D, spectral Doppler, and color Doppler. Birthdate: Patient birthdate: December 04, 1945. Age: Patient is 69 yr old. Sex: Gender: female. BMI: 18.7 kg/m^2. Blood pressure:   122/71 Patient status: Outpatient. Study date: Study date: 10/22/2014. Study time: 02:30 PM. Location: Echo laboratory.  -------------------------------------------------------------------  ------------------------------------------------------------------- Left ventricle: The cavity size was normal. Wall thickness was normal. Systolic function was normal. The estimated ejection fraction was in the range of 60% to 65%.  -------------------------------------------------------------------  Aortic valve: ONly the peak gradient was provided. the Mean gradient was not provided. THe valve appears to be severely stenotic. Bicuspid; moderately thickened, moderately calcified leaflets. Valve mobility was severely restricted. Doppler:  There was severe stenosis.   Valve area (VTI): 0.85 cm^2. Indexed valve area (VTI): 0.5 cm^2/m^2. Valve area (Vmax): 0.86 cm^2. Indexed valve area (Vmax): 0.51 cm^2/m^2.  Peak gradient (S): 73 mm Hg.  ------------------------------------------------------------------- Mitral valve:  Structurally normal valve.  Leaflet separation was normal. Doppler: Transvalvular velocity was within the normal range. There was no evidence for stenosis. There was no regurgitation.  Peak gradient (D): 3 mm Hg.  ------------------------------------------------------------------- Left atrium: The atrium was  normal in size.  ------------------------------------------------------------------- Right ventricle: The cavity size was normal. Systolic function was normal.  ------------------------------------------------------------------- Pulmonic valve:  The valve appears to be grossly normal. Doppler: There was no significant regurgitation.  ------------------------------------------------------------------- Tricuspid valve:  Structurally normal valve.  Leaflet separation was normal. Doppler: Transvalvular velocity was within the normal range. There was trivial regurgitation.  ------------------------------------------------------------------- Pulmonary artery:  Systolic pressure was within the normal range.  ------------------------------------------------------------------- Right atrium: The atrium was normal in size.  ------------------------------------------------------------------- Pericardium: The pericardium was normal in appearance.  ------------------------------------------------------------------- Systemic veins: Inferior vena cava: The vessel was normal in size. The respirophasic diameter changes were in the normal range (= 50%), consistent with normal central venous pressure. Diameter: 15 mm.  ------------------------------------------------------------------- Measurements  IVC                    Value     Reference ID                    15  mm    ---------  Left ventricle              Value     Reference Stroke volume, 2D             91  ml    --------- Stroke volume/bsa, 2D           54  ml/m^2  --------- LV e&', lateral              10.2 cm/s   --------- LV E/e&', lateral             8.18      --------- LV e&', medial               8.66 cm/s   --------- LV E/e&', medial               9.63      --------- LV e&', average              9.43 cm/s   --------- LV E/e&', average             8.84      ---------  LVOT                   Value     Reference LVOT ID, S                20  mm    --------- LVOT area                 3.14 cm^2   --------- LVOT peak velocity, S           117  cm/s   --------- LVOT mean velocity, S           78.8 cm/s   ---------  LVOT VTI, S                29.1 cm    --------- LVOT peak gradient, S           5   mm Hg  ---------  Aortic valve               Value     Reference Aortic peak gradient, S          73  mm Hg  --------- Aortic valve area, VTI          0.85 cm^2   --------- Aortic valve area/bsa, VTI        0.5  cm^2/m^2 --------- Aortic valve area, peak velocity     0.86 cm^2   --------- Aortic valve area/bsa, peak        0.51 cm^2/m^2 --------- velocity  Left atrium                Value     Reference LA volume, S               40  ml    --------- LA volume/bsa, S             23.6 ml/m^2  --------- LA volume, ES, 1-p A4C          34  ml    --------- LA volume/bsa, ES, 1-p A4C        20.1 ml/m^2  --------- LA volume, ES, 1-p A2C          47  ml    --------- LA volume/bsa, ES, 1-p A2C        27.7 ml/m^2  ---------  Mitral valve               Value     Reference Mitral E-wave peak velocity        83.4 cm/s   --------- Mitral A-wave peak velocity        64.2 cm/s   --------- Mitral deceleration time         211  ms    150 - 230 Mitral peak gradient, D          3   mm Hg  --------- Mitral E/A ratio, peak           1.3      ---------  Pulmonary arteries            Value     Reference PA pressure, S, DP            29  mm Hg  <=30  Tricuspid valve              Value     Reference Tricuspid regurg peak velocity      254  cm/s   --------- Tricuspid peak RV-RA gradient       26  mm Hg  --------- Tricuspid maximal regurg velocity,    254  cm/s   --------- PISA  Systemic veins              Value     Reference Estimated CVP               3   mm Hg  ---------  Right ventricle              Value     Reference RV pressure, S, DP            29  mm Hg  <=  30 RV s&', lateral, S             13  cm/s   ---------  Legend: (L) and (H) mark values outside specified reference range.  ------------------------------------------------------------------- Prepared and Electronically Authenticated by  Mertie Moores, M.D.  Assessment / Plan: 1. Severe aortic stenosis with bicuspid aortic valve. Aortic root appears normal in size. There has been progression of her gradient since 2010 and again since 2014. I am concerned that her recent symptoms of fatigue may be related to her progressive AS. I have recommended proceeding with right and left heart cath. The procedure and risks were reviewed including but not limited to death, myocardial infarction, stroke, arrythmias, bleeding, transfusion, emergency surgery, dye allergy, or renal dysfunction. The patient voices understanding and is agreeable to proceed..Anticipate AVR in the near future.

## 2015-01-23 LAB — BASIC METABOLIC PANEL
BUN: 20 mg/dL (ref 6–23)
CALCIUM: 9.2 mg/dL (ref 8.4–10.5)
CO2: 26 mEq/L (ref 19–32)
Chloride: 102 mEq/L (ref 96–112)
Creat: 0.78 mg/dL (ref 0.50–1.10)
GLUCOSE: 99 mg/dL (ref 70–99)
Potassium: 4.3 mEq/L (ref 3.5–5.3)
Sodium: 140 mEq/L (ref 135–145)

## 2015-01-23 LAB — CBC WITH DIFFERENTIAL/PLATELET
Basophils Absolute: 0 10*3/uL (ref 0.0–0.1)
Basophils Relative: 0 % (ref 0–1)
EOS ABS: 0 10*3/uL (ref 0.0–0.7)
Eosinophils Relative: 1 % (ref 0–5)
HEMATOCRIT: 37.3 % (ref 36.0–46.0)
Hemoglobin: 12.5 g/dL (ref 12.0–15.0)
LYMPHS ABS: 1.3 10*3/uL (ref 0.7–4.0)
LYMPHS PCT: 28 % (ref 12–46)
MCH: 31.3 pg (ref 26.0–34.0)
MCHC: 33.5 g/dL (ref 30.0–36.0)
MCV: 93.3 fL (ref 78.0–100.0)
MPV: 11.7 fL (ref 8.6–12.4)
Monocytes Absolute: 0.3 10*3/uL (ref 0.1–1.0)
Monocytes Relative: 7 % (ref 3–12)
NEUTROS ABS: 3.1 10*3/uL (ref 1.7–7.7)
NEUTROS PCT: 64 % (ref 43–77)
Platelets: 177 10*3/uL (ref 150–400)
RBC: 4 MIL/uL (ref 3.87–5.11)
RDW: 13.5 % (ref 11.5–15.5)
WBC: 4.8 10*3/uL (ref 4.0–10.5)

## 2015-01-23 LAB — PROTIME-INR
INR: 1.1 (ref <1.50)
Prothrombin Time: 14.2 seconds (ref 11.6–15.2)

## 2015-01-29 ENCOUNTER — Encounter (HOSPITAL_COMMUNITY): Payer: Self-pay | Admitting: Cardiology

## 2015-01-29 ENCOUNTER — Ambulatory Visit (HOSPITAL_COMMUNITY)
Admission: RE | Admit: 2015-01-29 | Discharge: 2015-01-29 | Disposition: A | Discharge status: Home / Self Care | Payer: PPO | Source: Ambulatory Visit | Attending: Cardiology | Admitting: Cardiology

## 2015-01-29 ENCOUNTER — Encounter (HOSPITAL_COMMUNITY)
Admission: RE | Disposition: A | Discharge status: Home / Self Care | Payer: PPO | Source: Ambulatory Visit | Attending: Cardiology

## 2015-01-29 DIAGNOSIS — I350 Nonrheumatic aortic (valve) stenosis: Secondary | ICD-10-CM | POA: Diagnosis not present

## 2015-01-29 DIAGNOSIS — F1010 Alcohol abuse, uncomplicated: Secondary | ICD-10-CM | POA: Insufficient documentation

## 2015-01-29 DIAGNOSIS — D72819 Decreased white blood cell count, unspecified: Secondary | ICD-10-CM | POA: Insufficient documentation

## 2015-01-29 DIAGNOSIS — M8580 Other specified disorders of bone density and structure, unspecified site: Secondary | ICD-10-CM | POA: Diagnosis not present

## 2015-01-29 DIAGNOSIS — Z9851 Tubal ligation status: Secondary | ICD-10-CM | POA: Insufficient documentation

## 2015-01-29 DIAGNOSIS — Z880 Allergy status to penicillin: Secondary | ICD-10-CM | POA: Diagnosis not present

## 2015-01-29 DIAGNOSIS — Z90710 Acquired absence of both cervix and uterus: Secondary | ICD-10-CM | POA: Insufficient documentation

## 2015-01-29 DIAGNOSIS — E785 Hyperlipidemia, unspecified: Secondary | ICD-10-CM | POA: Diagnosis present

## 2015-01-29 DIAGNOSIS — F419 Anxiety disorder, unspecified: Secondary | ICD-10-CM | POA: Insufficient documentation

## 2015-01-29 DIAGNOSIS — R569 Unspecified convulsions: Secondary | ICD-10-CM | POA: Diagnosis not present

## 2015-01-29 DIAGNOSIS — M109 Gout, unspecified: Secondary | ICD-10-CM | POA: Insufficient documentation

## 2015-01-29 DIAGNOSIS — Q231 Congenital insufficiency of aortic valve: Secondary | ICD-10-CM | POA: Insufficient documentation

## 2015-01-29 HISTORY — PX: CARDIAC CATHETERIZATION: SHX172

## 2015-01-29 SURGERY — RIGHT/LEFT HEART CATH AND CORONARY ANGIOGRAPHY
Anesthesia: LOCAL

## 2015-01-29 MED ORDER — SODIUM CHLORIDE 0.9 % WEIGHT BASED INFUSION
3.0000 mL/kg/h | INTRAVENOUS | Status: AC
Start: 2015-01-29 — End: 2015-01-29

## 2015-01-29 MED ORDER — LIDOCAINE HCL (PF) 1 % IJ SOLN
INTRAMUSCULAR | Status: AC
  Filled 2015-01-29: qty 30

## 2015-01-29 MED ORDER — FENTANYL CITRATE (PF) 100 MCG/2ML IJ SOLN
INTRAMUSCULAR | Status: AC
  Filled 2015-01-29: qty 2

## 2015-01-29 MED ORDER — ASPIRIN 81 MG PO CHEW
81.0000 mg | CHEWABLE_TABLET | ORAL | Status: AC
  Administered 2015-01-29: 81 mg via ORAL

## 2015-01-29 MED ORDER — ASPIRIN 81 MG PO CHEW
CHEWABLE_TABLET | ORAL | Status: AC
  Filled 2015-01-29: qty 1

## 2015-01-29 MED ORDER — VERAPAMIL HCL 2.5 MG/ML IV SOLN
INTRAVENOUS | Status: DC | PRN
  Administered 2015-01-29: 09:00:00 via INTRA_ARTERIAL

## 2015-01-29 MED ORDER — SODIUM CHLORIDE 0.9 % IV SOLN: 250.0000 mL | INTRAVENOUS | Status: DC | PRN

## 2015-01-29 MED ORDER — NITROGLYCERIN 1 MG/10 ML FOR IR/CATH LAB
INTRA_ARTERIAL | Status: AC
  Filled 2015-01-29: qty 10

## 2015-01-29 MED ORDER — FENTANYL CITRATE (PF) 100 MCG/2ML IJ SOLN
INTRAMUSCULAR | Status: DC | PRN
  Administered 2015-01-29: 25 ug via INTRAVENOUS

## 2015-01-29 MED ORDER — MIDAZOLAM HCL 2 MG/2ML IJ SOLN
INTRAMUSCULAR | Status: AC
  Filled 2015-01-29: qty 2

## 2015-01-29 MED ORDER — IOHEXOL 350 MG/ML SOLN
INTRAVENOUS | Status: DC | PRN
  Administered 2015-01-29: 70 mL via INTRAVENOUS

## 2015-01-29 MED ORDER — SODIUM CHLORIDE 0.9 % IJ SOLN: 3.0000 mL | Freq: Two times a day (BID) | INTRAMUSCULAR | Status: DC

## 2015-01-29 MED ORDER — HEPARIN SODIUM (PORCINE) 1000 UNIT/ML IJ SOLN
INTRAMUSCULAR | Status: DC | PRN
  Administered 2015-01-29: 3000 [IU] via INTRAVENOUS

## 2015-01-29 MED ORDER — HEPARIN (PORCINE) IN NACL 2-0.9 UNIT/ML-% IJ SOLN
INTRAMUSCULAR | Status: AC
  Filled 2015-01-29: qty 1000

## 2015-01-29 MED ORDER — SODIUM CHLORIDE 0.9 % WEIGHT BASED INFUSION: 1.0000 mL/kg/h | INTRAVENOUS | Status: DC

## 2015-01-29 MED ORDER — SODIUM CHLORIDE 0.9 % IJ SOLN: 3.0000 mL | INTRAMUSCULAR | Status: DC | PRN

## 2015-01-29 MED ORDER — HEPARIN SODIUM (PORCINE) 1000 UNIT/ML IJ SOLN
INTRAMUSCULAR | Status: AC
  Filled 2015-01-29: qty 1

## 2015-01-29 MED ORDER — VERAPAMIL HCL 2.5 MG/ML IV SOLN
INTRAVENOUS | Status: AC
  Filled 2015-01-29: qty 2

## 2015-01-29 MED ORDER — HEPARIN (PORCINE) IN NACL 2-0.9 UNIT/ML-% IJ SOLN
INTRAMUSCULAR | Status: AC
  Filled 2015-01-29: qty 500

## 2015-01-29 MED ORDER — SODIUM CHLORIDE 0.9 % WEIGHT BASED INFUSION
3.0000 mL/kg/h | INTRAVENOUS | Status: DC
  Administered 2015-01-29: 3 mL/kg/h via INTRAVENOUS

## 2015-01-29 MED ORDER — MIDAZOLAM HCL 2 MG/2ML IJ SOLN
INTRAMUSCULAR | Status: DC | PRN
  Administered 2015-01-29: 2 mg via INTRAVENOUS

## 2015-01-29 SURGICAL SUPPLY — 23 items
CATH BALLN WEDGE 5F 110CM (CATHETERS) ×1 IMPLANT
CATH INFINITI 5 FR JL3.5 (CATHETERS) ×2 IMPLANT
CATH INFINITI 5FR AL1 (CATHETERS) ×1 IMPLANT
CATH INFINITI 5FR ANG PIGTAIL (CATHETERS) ×2 IMPLANT
CATH INFINITI 5FR MULTPACK ANG (CATHETERS) IMPLANT
CATH INFINITI JR4 5F (CATHETERS) ×2 IMPLANT
CATH LAUNCHER 5F RADL (CATHETERS) IMPLANT
CATH SWAN GANZ 7F STRAIGHT (CATHETERS) IMPLANT
CATHETER LAUNCHER 5F RADL (CATHETERS) ×2
DEVICE RAD COMP TR BAND LRG (VASCULAR PRODUCTS) ×2 IMPLANT
GLIDESHEATH SLEND SS 6F .021 (SHEATH) ×2 IMPLANT
GUIDEWIRE STRAIGHT .035 145CM (WIRE) ×1 IMPLANT
KIT HEART LEFT (KITS) ×2 IMPLANT
KIT HEART RIGHT NAMIC (KITS) ×1 IMPLANT
PACK CARDIAC CATHETERIZATION (CUSTOM PROCEDURE TRAY) ×2 IMPLANT
SHEATH FAST CATH BRACH 5F 5CM (SHEATH) ×1 IMPLANT
SHEATH PINNACLE 5F 10CM (SHEATH) IMPLANT
SHEATH PINNACLE 7F 10CM (SHEATH) IMPLANT
SYR MEDRAD MARK V 150ML (SYRINGE) ×1 IMPLANT
TRANSDUCER W/STOPCOCK (MISCELLANEOUS) ×3 IMPLANT
TUBING CIL FLEX 10 FLL-RA (TUBING) ×2 IMPLANT
WIRE EMERALD 3MM-J .035X150CM (WIRE) IMPLANT
WIRE SAFE-T 1.5MM-J .035X260CM (WIRE) ×2 IMPLANT

## 2015-01-29 NOTE — H&P (View-Only) (Signed)
Nicole Hess Date of Birth: May 21, 1946 Medical Record #[id number]  History of Present Illness: Nicole Hess is seen today for followup of aortic stenosis. She  has a history of bicuspid aortic valve with aortic stenosis. Evaluation in 2010 with echocardiogram showed a mean gradient of 14 mmHg consistent with mild aortic stenosis. Echo in May 2014 showed a mean gradient of 25 with moderate AS. In August 2015 Echo showed increase in valve mean gradient to 46 mm Hg with valve area of .97 cm2. Peak gradient was 69 mm Hg. Recently she experienced symptoms of  Fatigue. Lab work showed no reason for this. No chest pain, SOB, palpitations, or dizziness. She does walking daily. She is under a lot of stress at work.  Current Outpatient Prescriptions on File Prior to Visit  Medication Sig Dispense Refill  . Ascorbic Acid (VITAMIN C) 1000 MG tablet Take 1,000 mg by mouth daily.    Marland Kitchen b complex vitamins tablet Take 1 tablet by mouth daily.    . Calcium Carbonate-Vitamin D (CALTRATE 600+D PO) Take 1 tablet by mouth 2 (two) times daily.    . Echinacea 125 MG CAPS Take 125 mg by mouth daily.     Marland Kitchen estradiol (ESTRACE) 0.5 MG tablet Take 1 tablet (0.5 mg total) by mouth daily. 90 tablet 1  . Estradiol (VAGIFEM) 10 MCG TABS vaginal tablet INSERT 1 TABLET VAGINALLY 2 TIMES A WEEK 24 tablet 4  . Glucosamine-Chondroitin (OSTEO BI-FLEX REGULAR STRENGTH PO) Take 1 tablet by mouth 2 (two) times daily.     . Multiple Vitamin (MULITIVITAMIN WITH MINERALS) TABS Take 1 tablet by mouth daily.    . Omega-3 Fatty Acids (OMEGA-3 FISH OIL PO) Take 1 capsule by mouth 2 (two) times daily.     . pravastatin (PRAVACHOL) 80 MG tablet Take 80 mg by mouth daily.    . primidone (MYSOLINE) 250 MG tablet Take 250 mg by mouth 2 (two) times daily.    . temazepam (RESTORIL) 30 MG capsule Take 30 mg by mouth at bedtime as needed. For sleep    . Tretinoin Microsphere (RETIN-A MICRO EX) Apply 1 application topically daily.     . vitamin B-12  (CYANOCOBALAMIN) 50 MCG tablet Take 50 mcg by mouth daily.     No current facility-administered medications on file prior to visit.    Allergies  Allergen Reactions  . Penicillins Rash    Past Medical History  Diagnosis Date  . Seizures     as a child  . Bicuspid aortic valve   . Aortic stenosis   . Leukopenia     Seen by Dr. Lamonte Sakai, present since 2008, watchful waiting  . Gout   . Osteopenia   . Abnormal Pap smear   . Anxiety   . Heart murmur   . Heart valve problem     will have follow up Echo-Dr Marvel Mcphillips-will have replacement in a couple of years    Past Surgical History  Procedure Laterality Date  . Oophorectomy    . Partial hysterectomy    . Gum surgery    . Tubal ligation    . Cervical cone biopsy    . Vaginal hysterectomy      for dysplasia  . Colonoscopy  2010  . Doppler echocardiography  3/14    History  Smoking status  . Never Smoker   Smokeless tobacco  . Never Used    History  Alcohol Use  . Yes    Comment: 2 glasses of wine/cranberry juice  a day    Family History  Problem Relation Age of Onset  . Multiple myeloma Mother   . Stroke Father   . Hypertension Father   . Heart disease Father 61  . Kidney disease Father   . Cancer Father   . Cancer Maternal Grandmother     renal    Review of Systems: The review of systems is positive for chronic anxiety.  All other systems were reviewed and are negative.  Physical Exam: BP 130/72 mmHg  Pulse 76  Ht 5' 9"  (1.753 m)  Wt 137 lb 8 oz (62.37 kg)  BMI 20.30 kg/m2  LMP 09/15/1987 She is a pleasant white female in no acute distress. HEENT: Normocephalic, atraumatic. Pupils equal round and reactive to light and accommodation. Extraocular movements are full. Oropharynx is clear. Neck: No JVD, adenopathy, thyromegaly, or bruits. Lungs: Clear Cardiovascular: Regular rate and rhythm. Normal S1 and S2. Grade 3/6 systolic murmur heard best at right upper sternal border radiating to the carotids and  apex. Abdomen: Soft and nontender. No masses or bruits. Bowel sounds positive. Extremities: No cyanosis or edema. Pulses are 2+ and symmetric. Skin: Warm and dry Neuro: Alert and oriented x3. Cranial nerves II through XII are intact. Is appropriate.  LABORATORY DATA: Lab Results  Component Value Date   WBC 3.3* 01/15/2015   HGB 12.6 01/15/2015   HCT 37.1 01/15/2015   PLT 164 01/15/2015   GLUCOSE 94 01/15/2015   CHOL 200 01/15/2015   TRIG 81 01/15/2015   HDL 84 01/15/2015   LDLCALC 100* 01/15/2015   ALT 30 01/15/2015   AST 36 01/15/2015   NA 140 01/15/2015   K 4.1 01/15/2015   CL 103 01/15/2015   CREATININE 0.75 01/15/2015   BUN 22 01/15/2015   CO2 31 01/15/2015   TSH 1.560 01/15/2015    CHEST 2 VIEW  COMPARISON: None.  FINDINGS: Normal mediastinum and cardiac silhouette. Normal pulmonary vasculature. No evidence of effusion, infiltrate, or pneumothorax. No acute bony abnormality.  IMPRESSION: No acute cardiopulmonary process.   Electronically Signed  By: Suzy Bouchard M.D.  On: 06/02/2014 21:05   Echo:10/22/14:Transthoracic Echocardiography  Patient:  Nicole Hess, Nicole Hess MR #:    [id number] Study Date: 10/22/2014 Gender:   F Age:    23 Height:   177.8 cm Weight:   59 kg BSA:    1.69 m^2 Pt. Status: Room:  ATTENDING  [name], M.D. ORDERING   [name], M.D. REFERRING  [name], M.D. SONOGRAPHER [name], RCS PERFORMING  [facility], Outpatient  cc:  ------------------------------------------------------------------- LV EF: 60% -  65%  ------------------------------------------------------------------- Indications:   424.1 Aortic valve disorders.  ------------------------------------------------------------------- History:  PMH: Hyperlipidemia.  ------------------------------------------------------------------- Study Conclusions  - Left ventricle: The cavity size was normal. Wall  thickness was normal. Systolic function was normal. The estimated ejection fraction was in the range of 60% to 65%. - Aortic valve: Bicuspid; moderately thickened, moderately calcified leaflets. Valve mobility was severely restricted. There was severe stenosis. Valve area (VTI): 0.85 cm^2. Valve area (Vmax): 0.86 cm^2.  Transthoracic echocardiography. M-mode, complete 2D, spectral Doppler, and color Doppler. Birthdate: Patient birthdate: 02-23-1946. Age: Patient is 69 yr old. Sex: Gender: female. BMI: 18.7 kg/m^2. Blood pressure:   122/71 Patient status: Outpatient. Study date: Study date: 10/22/2014. Study time: 02:30 PM. Location: Echo laboratory.  -------------------------------------------------------------------  ------------------------------------------------------------------- Left ventricle: The cavity size was normal. Wall thickness was normal. Systolic function was normal. The estimated ejection fraction was in the range of 60% to 65%.  -------------------------------------------------------------------  Aortic valve: ONly the peak gradient was provided. the Mean gradient was not provided. THe valve appears to be severely stenotic. Bicuspid; moderately thickened, moderately calcified leaflets. Valve mobility was severely restricted. Doppler:  There was severe stenosis.   Valve area (VTI): 0.85 cm^2. Indexed valve area (VTI): 0.5 cm^2/m^2. Valve area (Vmax): 0.86 cm^2. Indexed valve area (Vmax): 0.51 cm^2/m^2.  Peak gradient (S): 73 mm Hg.  ------------------------------------------------------------------- Mitral valve:  Structurally normal valve.  Leaflet separation was normal. Doppler: Transvalvular velocity was within the normal range. There was no evidence for stenosis. There was no regurgitation.  Peak gradient (D): 3 mm Hg.  ------------------------------------------------------------------- Left atrium: The atrium was  normal in size.  ------------------------------------------------------------------- Right ventricle: The cavity size was normal. Systolic function was normal.  ------------------------------------------------------------------- Pulmonic valve:  The valve appears to be grossly normal. Doppler: There was no significant regurgitation.  ------------------------------------------------------------------- Tricuspid valve:  Structurally normal valve.  Leaflet separation was normal. Doppler: Transvalvular velocity was within the normal range. There was trivial regurgitation.  ------------------------------------------------------------------- Pulmonary artery:  Systolic pressure was within the normal range.  ------------------------------------------------------------------- Right atrium: The atrium was normal in size.  ------------------------------------------------------------------- Pericardium: The pericardium was normal in appearance.  ------------------------------------------------------------------- Systemic veins: Inferior vena cava: The vessel was normal in size. The respirophasic diameter changes were in the normal range (= 50%), consistent with normal central venous pressure. Diameter: 15 mm.  ------------------------------------------------------------------- Measurements  IVC                    Value     Reference ID                    15  mm    ---------  Left ventricle              Value     Reference Stroke volume, 2D             91  ml    --------- Stroke volume/bsa, 2D           54  ml/m^2  --------- LV e&', lateral              10.2 cm/s   --------- LV E/e&', lateral             8.18      --------- LV e&', medial               8.66 cm/s   --------- LV E/e&', medial               9.63      --------- LV e&', average              9.43 cm/s   --------- LV E/e&', average             8.84      ---------  LVOT                   Value     Reference LVOT ID, S                20  mm    --------- LVOT area                 3.14 cm^2   --------- LVOT peak velocity, S           117  cm/s   --------- LVOT mean velocity, S           78.8 cm/s   ---------  LVOT VTI, S                29.1 cm    --------- LVOT peak gradient, S           5   mm Hg  ---------  Aortic valve               Value     Reference Aortic peak gradient, S          73  mm Hg  --------- Aortic valve area, VTI          0.85 cm^2   --------- Aortic valve area/bsa, VTI        0.5  cm^2/m^2 --------- Aortic valve area, peak velocity     0.86 cm^2   --------- Aortic valve area/bsa, peak        0.51 cm^2/m^2 --------- velocity  Left atrium                Value     Reference LA volume, S               40  ml    --------- LA volume/bsa, S             23.6 ml/m^2  --------- LA volume, ES, 1-p A4C          34  ml    --------- LA volume/bsa, ES, 1-p A4C        20.1 ml/m^2  --------- LA volume, ES, 1-p A2C          47  ml    --------- LA volume/bsa, ES, 1-p A2C        27.7 ml/m^2  ---------  Mitral valve               Value     Reference Mitral E-wave peak velocity        83.4 cm/s   --------- Mitral A-wave peak velocity        64.2 cm/s   --------- Mitral deceleration time         211  ms    150 - 230 Mitral peak gradient, D          3   mm Hg  --------- Mitral E/A ratio, peak           1.3      ---------  Pulmonary arteries            Value     Reference PA pressure, S, DP            29  mm Hg  <=30  Tricuspid valve              Value     Reference Tricuspid regurg peak velocity      254  cm/s   --------- Tricuspid peak RV-RA gradient       26  mm Hg  --------- Tricuspid maximal regurg velocity,    254  cm/s   --------- PISA  Systemic veins              Value     Reference Estimated CVP               3   mm Hg  ---------  Right ventricle              Value     Reference RV pressure, S, DP            29  mm Hg  <=  30 RV s&', lateral, S             13  cm/s   ---------  Legend: (L) and (H) mark values outside specified reference range.  ------------------------------------------------------------------- Prepared and Electronically Authenticated by  Mertie Moores, M.D.  Assessment / Plan: 1. Severe aortic stenosis with bicuspid aortic valve. Aortic root appears normal in size. There has been progression of her gradient since 2010 and again since 2014. I am concerned that her recent symptoms of fatigue may be related to her progressive AS. I have recommended proceeding with right and left heart cath. The procedure and risks were reviewed including but not limited to death, myocardial infarction, stroke, arrythmias, bleeding, transfusion, emergency surgery, dye allergy, or renal dysfunction. The patient voices understanding and is agreeable to proceed..Anticipate AVR in the near future.

## 2015-01-29 NOTE — Progress Notes (Signed)
RT braschial sheath removed. Pressure held x 5 minutes. Site level 0.

## 2015-01-29 NOTE — Interval H&P Note (Signed)
History and Physical Interval Note:  01/29/2015 8:52 AM  Nicole Hess  has presented today for surgery, with the diagnosis of severe aortic stenosis  The various methods of treatment have been discussed with the patient and family. After consideration of risks, benefits and other options for treatment, the patient has consented to  Procedure(s): Right/Left Heart Cath and Coronary Angiography (N/A) as a surgical intervention .  The patient's history has been reviewed, patient examined, no change in status, stable for surgery.  I have reviewed the patient's chart and labs.  Questions were answered to the patient's satisfaction.     [facility]

## 2015-01-29 NOTE — Discharge Instructions (Signed)
Radial Site Care °Refer to this sheet in the next few weeks. These instructions provide you with information on caring for yourself after your procedure. Your caregiver may also give you more specific instructions. Your treatment has been planned according to current medical practices, but problems sometimes occur. Call your caregiver if you have any problems or questions after your procedure. °HOME CARE INSTRUCTIONS °· You may shower the day after the procedure. Remove the bandage (dressing) and gently wash the site with plain soap and water. Gently pat the site dry. °· Do not apply powder or lotion to the site. °· Do not submerge the affected site in water for 3 to 5 days. °· Inspect the site at least twice daily. °· Do not flex or bend the affected arm for 24 hours. °· No lifting over 5 pounds (2.3 kg) for 5 days after your procedure. °· Do not drive home if you are discharged the same day of the procedure. Have someone else drive you. °· You may drive 24 hours after the procedure unless otherwise instructed by your caregiver. °· Do not operate machinery or power tools for 24 hours. °· A responsible adult should be with you for the first 24 hours after you arrive home. °What to expect: °· Any bruising will usually fade within 1 to 2 weeks. °· Blood that collects in the tissue (hematoma) may be painful to the touch. It should usually decrease in size and tenderness within 1 to 2 weeks. °SEEK IMMEDIATE MEDICAL CARE IF: °· You have unusual pain at the radial site. °· You have redness, warmth, swelling, or pain at the radial site. °· You have drainage (other than a small amount of blood on the dressing). °· You have chills. °· You have a fever or persistent symptoms for more than 72 hours. °· You have a fever and your symptoms suddenly get worse. °· Your arm becomes pale, cool, tingly, or numb. °· You have heavy bleeding from the site. Hold pressure on the site. °Document Released: 10/03/2010 Document Revised:  11/23/2011 Document Reviewed: 10/03/2010 °[employer]® Patient Information ©2015 [employer]. This information is not intended to replace advice given to you by your health care provider. Make sure you discuss any questions you have with your health care provider. ° °

## 2015-01-30 ENCOUNTER — Encounter: Payer: Self-pay | Admitting: Cardiology

## 2015-01-30 ENCOUNTER — Telehealth: Payer: Self-pay | Admitting: Cardiology

## 2015-01-30 LAB — POCT I-STAT 3, VENOUS BLOOD GAS (G3P V)
Acid-Base Excess: 1 mmol/L (ref 0.0–2.0)
BICARBONATE: 26.1 meq/L — AB (ref 20.0–24.0)
O2 Saturation: 73 %
PO2 VEN: 40 mmHg (ref 30.0–45.0)
TCO2: 27 mmol/L (ref 0–100)
pCO2, Ven: 44.5 mmHg — ABNORMAL LOW (ref 45.0–50.0)
pH, Ven: 7.376 — ABNORMAL HIGH (ref 7.250–7.300)

## 2015-01-30 LAB — POCT I-STAT 3, ART BLOOD GAS (G3+)
BICARBONATE: 25 meq/L — AB (ref 20.0–24.0)
O2 Saturation: 96 %
PO2 ART: 83 mmHg (ref 80.0–100.0)
TCO2: 26 mmol/L (ref 0–100)
pCO2 arterial: 41.6 mmHg (ref 35.0–45.0)
pH, Arterial: 7.387 (ref 7.350–7.450)

## 2015-01-30 MED FILL — Heparin Sodium (Porcine) 2 Unit/ML in Sodium Chloride 0.9%: INTRAMUSCULAR | Qty: 1000 | Status: AC

## 2015-01-30 MED FILL — Lidocaine HCl Local Preservative Free (PF) Inj 1%: INTRAMUSCULAR | Qty: 30 | Status: AC

## 2015-01-30 NOTE — Telephone Encounter (Signed)
LM on name-verified VM with OK to exercise (checked with DOD)

## 2015-01-30 NOTE — Telephone Encounter (Signed)
Pt had a Cath yesterday. She wants to know if she can get on the treadmill today and exercising?

## 2015-02-01 NOTE — Telephone Encounter (Signed)
Received a call from patient she stated cath site right wrist was bruised.No swelling.No fever.No pain.Advised bruising is normal.Advised to continue to monitor and call back if she notices any redness,fever,swelling and pain.

## 2015-02-05 ENCOUNTER — Encounter: Payer: Self-pay | Admitting: Surgery

## 2015-02-05 ENCOUNTER — Institutional Professional Consult (permissible substitution) (INDEPENDENT_AMBULATORY_CARE_PROVIDER_SITE_OTHER): Payer: PPO | Admitting: Surgery

## 2015-02-05 VITALS — BP 99/72 | HR 77 | Resp 16 | Ht 67.0 in | Wt 135.0 lb

## 2015-02-05 DIAGNOSIS — Q231 Congenital insufficiency of aortic valve: Secondary | ICD-10-CM | POA: Diagnosis not present

## 2015-02-05 DIAGNOSIS — I350 Nonrheumatic aortic (valve) stenosis: Secondary | ICD-10-CM | POA: Diagnosis not present

## 2015-02-05 NOTE — Progress Notes (Signed)
Cardiothoracic Surgery Consultation   PCP is Donnajean Lopes, MD Referring Provider is Martinique, Peter M, MD  Chief Complaint  Patient presents with  . Fatigue    bicuspid valve....eval for AVR...ECHO 10/22/14, CATH 01/29/15  . Aortic Stenosis    HPI:  The patient is a 69 year old woman with a history of bicuspid aortic valve disease that has been followed by Dr. Martinique with echo. Her most recent echo on 10/22/2014 showed progression to severe aortic stenosis with a mean gradient of 40 mm Hg and a valve area of 0.85 cm2. The valve is bicuspid with thickened, calcified leaflets with severely restricted motion. LVEF is 60-65% with no MS or MR. She underwent R and L heart cath on 01/29/2015 that showed normal coronaries with a mean transvalvular aortic gradient of 29 mm Hg with a peak of 32 mm Hg. Right heart pressures were normal.   She lives alone at [facility] and walks daily on the paths around [facility] about 1 mile with no symptoms of shortness of breath, chest pain or dizziness. She does report fatigue and shortness of breath with walking up stairs or inclines. 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 She is single and has never been married.  Past Medical History  Diagnosis Date  . Seizures     as a child  . Bicuspid aortic valve   . Aortic stenosis   . Leukopenia     Seen by Dr. Lamonte Sakai, present since 2008, watchful waiting  . Gout   . Osteopenia   . Abnormal Pap smear   . Anxiety   . Heart murmur   . Heart valve problem     will have follow up Echo-Dr Jordan-will have replacement in a couple of years    Past Surgical History  Procedure Laterality Date  . Oophorectomy    . Partial hysterectomy    . Gum surgery    . Tubal ligation    . Cervical cone biopsy    . Vaginal hysterectomy      for dysplasia  . Colonoscopy  2010  . Doppler echocardiography  3/14  . Cardiac catheterization N/A 01/29/2015    Procedure: Right/Left Heart Cath and Coronary  Angiography;  Surgeon: Peter M Martinique, MD;  Location: [facility];  Service: Cardiovascular;  Laterality: N/A;    Family History  Problem Relation Age of Onset  . Multiple myeloma Mother   . Stroke Father   . Hypertension Father   . Heart disease Father 64  . Kidney disease Father   . Cancer Father   . Cancer Maternal Grandmother     renal    Social History History  Substance Use Topics  . Smoking status: Never Smoker   . Smokeless tobacco: Never Used  . Alcohol Use: Yes     Comment: 2 glasses of wine/cranberry juice a day    Current Outpatient Prescriptions  Medication Sig Dispense Refill  . Ascorbic Acid (VITAMIN C) 1000 MG tablet Take 1,000 mg by mouth daily.    Marland Kitchen b complex vitamins tablet Take 1 tablet by mouth daily.    . Calcium Carbonate-Vitamin D (CALTRATE 600+D PO) Take 1 tablet by mouth 2 (two) times daily.    Marland Kitchen estradiol (ESTRACE) 0.5 MG tablet Take 1 tablet (0.5 mg total) by mouth daily. 90 tablet 1  . Estradiol (VAGIFEM) 10 MCG TABS vaginal tablet INSERT 1 TABLET VAGINALLY 2 TIMES A WEEK 24 tablet 4  .  Glucosamine-Chondroitin (OSTEO BI-FLEX REGULAR STRENGTH PO) Take 1 tablet by mouth 2 (two) times daily.     . Multiple Vitamin (MULITIVITAMIN WITH MINERALS) TABS Take 1 tablet by mouth daily.    . Omega-3 Fatty Acids (OMEGA-3 FISH OIL PO) Take 1 capsule by mouth 2 (two) times daily.     . pravastatin (PRAVACHOL) 80 MG tablet Take 80 mg by mouth daily.    . primidone (MYSOLINE) 250 MG tablet Take 250 mg by mouth 2 (two) times daily.    . temazepam (RESTORIL) 30 MG capsule Take 30 mg by mouth at bedtime as needed. For sleep    . Tretinoin Microsphere (RETIN-A MICRO EX) Apply 1 application topically daily.     . vitamin B-12 (CYANOCOBALAMIN) 50 MCG tablet Take 50 mcg by mouth daily.    . Echinacea 125 MG CAPS Take 125 mg by mouth daily.      No current facility-administered medications for this visit.    Allergies  Allergen Reactions  . Penicillins Rash     Review of Systems  Constitutional: Positive for fatigue. Negative for fever, activity change, appetite change and unexpected weight change.  HENT: Negative.        Sees her dentist every 6 months and has had no dental problems  Eyes: Negative.   Respiratory: Negative.   Cardiovascular: Positive for leg swelling. Negative for chest pain and palpitations.       In ankles  Gastrointestinal: Negative.   Genitourinary: Negative.   Musculoskeletal: Negative.   Skin: Negative.   Allergic/Immunologic: Negative.   Neurological: Negative for dizziness and light-headedness.  Hematological: Negative.   Psychiatric/Behavioral: Negative.     BP 99/72 mmHg  Pulse 77  Resp 16  Ht 5' 7" (1.702 m)  Wt 135 lb (61.236 kg)  BMI 21.14 kg/m2  SpO2 98%  LMP 09/15/1987 Physical Exam  Constitutional: She is oriented to person, place, and time. She appears well-developed and well-nourished. No distress.  HENT:  Head: Normocephalic and atraumatic.  Mouth/Throat: Oropharynx is clear and moist.  Eyes: EOM are normal. Pupils are equal, round, and reactive to light.  Neck: Normal range of motion. Neck supple. No JVD present. No thyromegaly present.  Cardiovascular: Normal rate, regular rhythm and intact distal pulses.   Murmur heard. 3/6 systolic murmur along RSB  Pulmonary/Chest: Effort normal and breath sounds normal. No respiratory distress. She has no rales. She exhibits no tenderness.  Abdominal: Soft. Bowel sounds are normal. She exhibits no distension and no mass. There is no tenderness.  Musculoskeletal: Normal range of motion. She exhibits no edema.  Lymphadenopathy:    She has no cervical adenopathy.  Neurological: She is alert and oriented to person, place, and time. She has normal strength. No cranial nerve deficit or sensory deficit.  Skin: Skin is warm and dry.  Psychiatric: She has a normal mood and affect.     Diagnostic Tests:    *[facility], [place], [place] [address]              [phone]  ------------------------------------------------------------------- Transthoracic Echocardiography  Patient:  Nicole Hess, Nicole Hess MR #:    [id number] Study Date: 10/22/2014 Gender:   F Age:    47 Height:   177.8 cm Weight:   59 kg BSA:    1.69 m^2 Pt. Status: Room:  ATTENDING  Peter Martinique, M.D. ORDERING  Peter Martinique, M.D. REFERRING  Peter Martinique, M.D. SONOGRAPHER [name], RCS PERFORMING  [facility], Outpatient  cc:  ------------------------------------------------------------------- LV EF: 60% -  65%  ------------------------------------------------------------------- Indications:   424.1 Aortic valve disorders.  ------------------------------------------------------------------- History:  PMH: Hyperlipidemia.  ------------------------------------------------------------------- Study Conclusions  - Left ventricle: The cavity size was normal. Wall thickness was normal. Systolic function was normal. The estimated ejection fraction was in the range of 60% to 65%. - Aortic valve: Bicuspid; moderately thickened, moderately calcified leaflets. Valve mobility was severely restricted. There was severe stenosis. Valve area (VTI): 0.85 cm^2. Valve area (Vmax): 0.86 cm^2.  Transthoracic echocardiography. M-mode, complete 2D, spectral Doppler, and color Doppler. Birthdate: Patient birthdate: [birth year]. Age: Patient is 69 yr old. Sex: Gender: female. BMI: 18.7 kg/m^2. Blood pressure:   122/71 Patient status: Outpatient. Study date: Study date: 10/22/2014. Study time: 02:30 PM. Location: Echo laboratory.  -------------------------------------------------------------------  ------------------------------------------------------------------- Left ventricle: The cavity size was normal.  Wall thickness was normal. Systolic function was normal. The estimated ejection fraction was in the range of 60% to 65%.  ------------------------------------------------------------------- Aortic valve: ONly the peak gradient was provided. the Mean gradient was not provided. THe valve appears to be severely stenotic. Bicuspid; moderately thickened, moderately calcified leaflets. Valve mobility was severely restricted. Doppler:  There was severe stenosis.   Valve area (VTI): 0.85 cm^2. Indexed valve area (VTI): 0.5 cm^2/m^2. Valve area (Vmax): 0.86 cm^2. Indexed valve area (Vmax): 0.51 cm^2/m^2.  Peak gradient (S): 73 mm Hg.  ------------------------------------------------------------------- Mitral valve:  Structurally normal valve.  Leaflet separation was normal. Doppler: Transvalvular velocity was within the normal range. There was no evidence for stenosis. There was no regurgitation.  Peak gradient (D): 3 mm Hg.  ------------------------------------------------------------------- Left atrium: The atrium was normal in size.  ------------------------------------------------------------------- Right ventricle: The cavity size was normal. Systolic function was normal.  ------------------------------------------------------------------- Pulmonic valve:  The valve appears to be grossly normal. Doppler: There was no significant regurgitation.  ------------------------------------------------------------------- Tricuspid valve:  Structurally normal valve.  Leaflet separation was normal. Doppler: Transvalvular velocity was within the normal range. There was trivial regurgitation.  ------------------------------------------------------------------- Pulmonary artery:  Systolic pressure was within the normal range.  ------------------------------------------------------------------- Right atrium: The atrium was normal in  size.  ------------------------------------------------------------------- Pericardium: The pericardium was normal in appearance.  ------------------------------------------------------------------- Systemic veins: Inferior vena cava: The vessel was normal in size. The respirophasic diameter changes were in the normal range (= 50%), consistent with normal central venous pressure. Diameter: 15 mm.  ------------------------------------------------------------------- Measurements  IVC                    Value     Reference ID                    15  mm    ---------  Left ventricle              Value     Reference Stroke volume, 2D             91  ml    --------- Stroke volume/bsa, 2D           54  ml/m^2  --------- LV e&', lateral              10.2 cm/s   --------- LV E/e&', lateral             8.18      --------- LV e&', medial  8.66 cm/s   --------- LV E/e&', medial              9.63      --------- LV e&', average              9.43 cm/s   --------- LV E/e&', average             8.84      ---------  LVOT                   Value     Reference LVOT ID, S                20  mm    --------- LVOT area                 3.14 cm^2   --------- LVOT peak velocity, S           117  cm/s   --------- LVOT mean velocity, S           78.8 cm/s   --------- LVOT VTI, S                29.1 cm    --------- LVOT peak gradient, S           5   mm Hg  ---------  Aortic valve               Value     Reference Aortic peak gradient, S          73  mm Hg  --------- Aortic valve area, VTI          0.85 cm^2    --------- Aortic valve area/bsa, VTI        0.5  cm^2/m^2 --------- Aortic valve area, peak velocity     0.86 cm^2   --------- Aortic valve area/bsa, peak        0.51 cm^2/m^2 --------- velocity  Left atrium                Value     Reference LA volume, S               40  ml    --------- LA volume/bsa, S             23.6 ml/m^2  --------- LA volume, ES, 1-p A4C          34  ml    --------- LA volume/bsa, ES, 1-p A4C        20.1 ml/m^2  --------- LA volume, ES, 1-p A2C          47  ml    --------- LA volume/bsa, ES, 1-p A2C        27.7 ml/m^2  ---------  Mitral valve               Value     Reference Mitral E-wave peak velocity        83.4 cm/s   --------- Mitral A-wave peak velocity        64.2 cm/s   --------- Mitral deceleration time         211  ms    150 - 230 Mitral peak gradient, D          3   mm Hg  --------- Mitral E/A ratio, peak          1.3      ---------  Pulmonary arteries  Value     Reference PA pressure, S, DP            29  mm Hg  <=30  Tricuspid valve              Value     Reference Tricuspid regurg peak velocity      254  cm/s   --------- Tricuspid peak RV-RA gradient       26  mm Hg  --------- Tricuspid maximal regurg velocity,    254  cm/s   --------- PISA  Systemic veins              Value     Reference Estimated CVP               3   mm Hg  ---------  Right ventricle              Value     Reference RV pressure, S, DP            29  mm Hg  <=30 RV s&', lateral, S             13  cm/s   ---------  Legend: (L) and (H) mark values outside  specified reference range.  ------------------------------------------------------------------- Prepared and Electronically Authenticated by  Mertie Moores, M.D. 2016-02-08T17:34:05   Conclusion     Normal coronary anatomy  Moderate to severe aortic stenosis, mean gradient of 29 mm Hg  valve area 1.1 cm squared, index 0.65  Normal right heart pressures.  Normal cardiac output.  Recommendation: refer to CT surgery to consider AV replacement.      Coronary Findings    Dominance: Right   Left Main  The vessel , is normal in caliber is angiographically normal. Calification at ostium     Left Anterior Descending  The vessel , is normal in caliber is angiographically normal.     Left Circumflex  The vessel , is normal in caliber is angiographically normal.     Right Coronary Artery  The vessel , is normal in caliber is angiographically normal.       Right Heart Pressures Hemodynamic findings consistent with aortic stenosis. LV EDP is normal.    Left Heart    Aortic Valve There is severe aortic valve stenosis. The aortic valve is calcified.           Hemo Data       Most Recent Value   Fick Cardiac Output  5.78 L/min   Fick Cardiac Output Index  3.4 (L/min)/BSA   Aortic Mean Gradient  28.9 mmHg   Aortic Peak Gradient  32 mmHg   RA A Wave  11 mmHg   RA V Wave  8 mmHg   RA Mean  7 mmHg   RV Systolic Pressure  33 mmHg   RV Diastolic Pressure  2 mmHg   RV EDP  10 mmHg   PA Systolic Pressure  26 mmHg   PA Diastolic Pressure  11 mmHg   PA Mean  18 mmHg   PW A Wave  17 mmHg   PW V Wave  21 mmHg   PW Mean  15 mmHg   AO Systolic Pressure  883 mmHg   AO Diastolic Pressure  61 mmHg   AO Mean  86 mmHg   LV Systolic Pressure  254 mmHg   LV Diastolic Pressure  0 mmHg   LV EDP  16 mmHg   Arterial Occlusion Pressure Extended Systolic Pressure  131 mmHg   Arterial Occlusion Pressure Extended Diastolic Pressure  63 mmHg    Arterial Occlusion Pressure Extended Mean Pressure  90 mmHg   Left Ventricular Apex Extended Systolic Pressure  194 mmHg   Left Ventricular Apex Extended Diastolic Pressure  0 mmHg   Left Ventricular Apex Extended EDP Pressure  17 mmHg    Implants    Name ID Temporary Type Supply   No information to display    Hemo Data       Most Recent Value   Fick Cardiac Output  5.78 L/min   Fick Cardiac Output Index  3.4 (L/min)/BSA   Aortic Mean Gradient  28.9 mmHg   Aortic Peak Gradient  32 mmHg   RA A Wave  11 mmHg   RA V Wave  8 mmHg   RA Mean  7 mmHg   RV Systolic Pressure  33 mmHg   RV Diastolic Pressure  2 mmHg   RV EDP  10 mmHg   PA Systolic Pressure  26 mmHg   PA Diastolic Pressure  11 mmHg   PA Mean  18 mmHg   PW A Wave  17 mmHg   PW V Wave  21 mmHg   PW Mean  15 mmHg   AO Systolic Pressure  174 mmHg   AO Diastolic Pressure  61 mmHg   AO Mean  86 mmHg   LV Systolic Pressure  081 mmHg   LV Diastolic Pressure  0 mmHg   LV EDP  16 mmHg   Arterial Occlusion Pressure Extended Systolic Pressure  448 mmHg   Arterial Occlusion Pressure Extended Diastolic Pressure  63 mmHg   Arterial Occlusion Pressure Extended Mean Pressure  90 mmHg   Left Ventricular Apex Extended Systolic Pressure  185 mmHg   Left Ventricular Apex Extended Diastolic Pressure  0 mmHg   Left Ventricular Apex Extended EDP Pressure  17 mmHg         Electronically signed by Peter M Martinique, MD on 01/29/15 at 1021 EDT                                       Impression:  She has a bicuspid aortic valve with severe, symptomatic aortic stenosis. She is very active and would like to remain so. I agree that AVR is indicated to prevent progressive LV deterioration and worsening symptoms. I discussed the operative procedure with the patient  including alternatives, benefits and risks; including but not limited to bleeding, blood transfusion, infection, stroke, myocardial  infarction, graft failure, heart block requiring a permanent pacemaker, organ dysfunction, and death.  [name] understands and agrees to proceed.  She will call to schedule AVR. I discussed the alternatives of mechanical and tissue valves, the pros and cons of both and my recomendation for a tissue valve. She understands and agrees with a tissue valve.  Plan:  She will call us to schedule AVR.  Gaye Pollack, MD [facility] [phone]

## 2015-02-07 ENCOUNTER — Other Ambulatory Visit (HOSPITAL_COMMUNITY): Payer: Self-pay | Admitting: Radiology

## 2015-02-07 ENCOUNTER — Encounter (HOSPITAL_COMMUNITY): Payer: Self-pay | Admitting: Surgery

## 2015-02-07 ENCOUNTER — Other Ambulatory Visit: Payer: Self-pay | Admitting: *Deleted

## 2015-02-07 DIAGNOSIS — I350 Nonrheumatic aortic (valve) stenosis: Secondary | ICD-10-CM

## 2015-02-09 ENCOUNTER — Encounter (HOSPITAL_COMMUNITY): Payer: Self-pay | Admitting: Surgery

## 2015-02-20 ENCOUNTER — Ambulatory Visit (INDEPENDENT_AMBULATORY_CARE_PROVIDER_SITE_OTHER): Payer: PPO | Admitting: Cardiology

## 2015-02-20 ENCOUNTER — Encounter: Payer: Self-pay | Admitting: Cardiology

## 2015-02-20 ENCOUNTER — Encounter: Payer: Self-pay | Admitting: Surgery

## 2015-02-20 VITALS — BP 114/74 | HR 74 | Ht 69.0 in | Wt 133.0 lb

## 2015-02-20 DIAGNOSIS — Q231 Congenital insufficiency of aortic valve: Secondary | ICD-10-CM

## 2015-02-20 DIAGNOSIS — I350 Nonrheumatic aortic (valve) stenosis: Secondary | ICD-10-CM

## 2015-02-20 DIAGNOSIS — E785 Hyperlipidemia, unspecified: Secondary | ICD-10-CM

## 2015-02-20 NOTE — Progress Notes (Signed)
[name] Date of Birth: [birth year] Medical Record #[id number]  History of Present Illness: Nicole Hess is seen today for followup of aortic stenosis. She  has a history of bicuspid aortic valve with aortic stenosis. Evaluation in 2010 with echocardiogram showed a mean gradient of 14 mmHg consistent with mild aortic stenosis. Echo in May 2014 showed a mean gradient of 25 with moderate AS. In August 2015 Echo showed increase in valve mean gradient to 46 mm Hg with valve area of .97 cm2. Peak gradient was 69 mm Hg. Recently she experienced symptoms of  Fatigue. Lab work showed no reason for this. Follow up Echo showed progression of aortic stenosis- now severe. Cardiac cath confirmed severe AS without CAD. She has seen Dr. Cyndia Bent and is scheduled for AVR next Tuesday.  Current Outpatient Prescriptions on File Prior to Visit  Medication Sig Dispense Refill  . Ascorbic Acid (VITAMIN C) 1000 MG tablet Take 1,000 mg by mouth daily.    Marland Kitchen b complex vitamins tablet Take 1 tablet by mouth daily.    . Calcium Carbonate-Vitamin D (CALTRATE 600+D PO) Take 1 tablet by mouth 2 (two) times daily.    . Echinacea 125 MG CAPS Take 125 mg by mouth daily.     Marland Kitchen estradiol (ESTRACE) 0.5 MG tablet Take 1 tablet (0.5 mg total) by mouth daily. 90 tablet 1  . Estradiol (VAGIFEM) 10 MCG TABS vaginal tablet INSERT 1 TABLET VAGINALLY 2 TIMES A WEEK 24 tablet 4  . Glucosamine-Chondroitin (OSTEO BI-FLEX REGULAR STRENGTH PO) Take 1 tablet by mouth 2 (two) times daily.     . Multiple Vitamin (MULITIVITAMIN WITH MINERALS) TABS Take 1 tablet by mouth daily.    . Omega-3 Fatty Acids (OMEGA-3 FISH OIL PO) Take 1 capsule by mouth 2 (two) times daily.     . pravastatin (PRAVACHOL) 80 MG tablet Take 80 mg by mouth daily.    . primidone (MYSOLINE) 250 MG tablet Take 250 mg by mouth 2 (two) times daily.    . temazepam (RESTORIL) 30 MG capsule Take 30 mg by mouth at bedtime as needed. For sleep    . Tretinoin Microsphere (RETIN-A  MICRO EX) Apply 1 application topically daily.     . vitamin B-12 (CYANOCOBALAMIN) 50 MCG tablet Take 50 mcg by mouth daily.     No current facility-administered medications on file prior to visit.    Allergies  Allergen Reactions  . Penicillins Rash    Past Medical History  Diagnosis Date  . Seizures     as a child  . Bicuspid aortic valve   . Aortic stenosis   . Leukopenia     Seen by Dr. Lamonte Sakai, present since 2008, watchful waiting  . Gout   . Osteopenia   . Abnormal Pap smear   . Anxiety   . Heart murmur   . Heart valve problem     will have follow up Echo-Dr Ajna Moors-will have replacement in a couple of years    Past Surgical History  Procedure Laterality Date  . Oophorectomy    . Partial hysterectomy    . Gum surgery    . Tubal ligation    . Cervical cone biopsy    . Vaginal hysterectomy      for dysplasia  . Colonoscopy  2010  . Doppler echocardiography  3/14  . Cardiac catheterization N/A 01/29/2015    Procedure: Right/Left Heart Cath and Coronary Angiography;  Surgeon: Charon Akamine M Martinique, MD;  Location: [facility];  Service: Cardiovascular;  Laterality: N/A;    History  Smoking status  . Never Smoker   Smokeless tobacco  . Never Used    History  Alcohol Use  . Yes    Comment: 2 glasses of wine/cranberry juice a day    Family History  Problem Relation Age of Onset  . Multiple myeloma Mother   . Stroke Father   . Hypertension Father   . Heart disease Father 59  . Kidney disease Father   . Cancer Father   . Cancer Maternal Grandmother     renal    Review of Systems: The review of systems is positive for chronic anxiety.  All other systems were reviewed and are negative.  Physical Exam: BP 114/74 mmHg  Pulse 74  Ht 5' 9"  (1.753 m)  Wt 60.328 kg (133 lb)  BMI 19.63 kg/m2  LMP 09/15/1987 She is a pleasant white female in no acute distress. HEENT: Normocephalic, atraumatic. Pupils equal round and reactive to light and accommodation.  Extraocular movements are full. Oropharynx is clear. Neck: No JVD, adenopathy, thyromegaly, or bruits. Lungs: Clear Cardiovascular: Regular rate and rhythm. Normal S1 and S2. Grade 3/6 systolic murmur heard best at right upper sternal border radiating to the carotids and apex. Abdomen: Soft and nontender. No masses or bruits. Bowel sounds positive. Extremities: No cyanosis or edema. Pulses are 2+ and symmetric. Skin: Warm and dry Neuro: Alert and oriented x3. Cranial nerves II through XII are intact. Is appropriate.  LABORATORY DATA: Lab Results  Component Value Date   WBC 4.8 01/22/2015   HGB 12.5 01/22/2015   HCT 37.3 01/22/2015   PLT 177 01/22/2015   GLUCOSE 99 01/22/2015   CHOL 200 01/15/2015   TRIG 81 01/15/2015   HDL 84 01/15/2015   LDLCALC 100* 01/15/2015   ALT 30 01/15/2015   AST 36 01/15/2015   NA 140 01/22/2015   K 4.3 01/22/2015   CL 102 01/22/2015   CREATININE 0.78 01/22/2015   BUN 20 01/22/2015   CO2 26 01/22/2015   TSH 1.560 01/15/2015   INR 1.10 01/22/2015    CHEST 2 VIEW  COMPARISON: None.  FINDINGS: Normal mediastinum and cardiac silhouette. Normal pulmonary vasculature. No evidence of effusion, infiltrate, or pneumothorax. No acute bony abnormality.  IMPRESSION: No acute cardiopulmonary process.   Electronically Signed  By: Suzy Bouchard M.D.  On: 06/02/2014 21:05   Echo:10/22/14:Transthoracic Echocardiography  Patient:  Nicole, Hess MR #:    [id number] Study Date: 10/22/2014 Gender:   F Age:    69 Height:   177.8 cm Weight:   59 kg BSA:    1.69 m^2 Pt. Status: Room:  ATTENDING  Aivan Fillingim Martinique, M.D. ORDERING   Boykin Baetz [place], M.D. REFERRING  Jag Lenz Martin[place], M.D. SONOGRAPHER [name], RCS PERFORMING  [facility], Outpatient  cc:  ------------------------------------------------------------------- LV EF: 60% -   65%  ------------------------------------------------------------------- Indications:   424.1 Aortic valve disorders.  ------------------------------------------------------------------- History:  PMH: Hyperlipidemia.  ------------------------------------------------------------------- Study Conclusions  - Left ventricle: The cavity size was normal. Wall thickness was normal. Systolic function was normal. The estimated ejection fraction was in the range of 60% to 65%. - Aortic valve: Bicuspid; moderately thickened, moderately calcified leaflets. Valve mobility was severely restricted. There was severe stenosis. Valve area (VTI): 0.85 cm^2. Valve area (Vmax): 0.86 cm^2.  Transthoracic echocardiography. M-mode, complete 2D, spectral Doppler, and color Doppler. Birthdate: Patient birthdate: Nov 20, 1945. Age: Patient is 69 yr old. Sex: Gender: female. BMI: 18.7 kg/m^2. Blood pressure:   122/71 Patient  status: Outpatient. Study date: Study date: 10/22/2014. Study time: 02:30 PM. Location: Echo laboratory.  -------------------------------------------------------------------  ------------------------------------------------------------------- Left ventricle: The cavity size was normal. Wall thickness was normal. Systolic function was normal. The estimated ejection fraction was in the range of 60% to 65%.  ------------------------------------------------------------------- Aortic valve: ONly the peak gradient was provided. the Mean gradient was not provided. THe valve appears to be severely stenotic. Bicuspid; moderately thickened, moderately calcified leaflets. Valve mobility was severely restricted. Doppler:  There was severe stenosis.   Valve area (VTI): 0.85 cm^2. Indexed valve area (VTI): 0.5 cm^2/m^2. Valve area (Vmax): 0.86 cm^2. Indexed valve area (Vmax): 0.51 cm^2/m^2.  Peak gradient (S): 73 mm  Hg.  ------------------------------------------------------------------- Mitral valve:  Structurally normal valve.  Leaflet separation was normal. Doppler: Transvalvular velocity was within the normal range. There was no evidence for stenosis. There was no regurgitation.  Peak gradient (D): 3 mm Hg.  ------------------------------------------------------------------- Left atrium: The atrium was normal in size.  ------------------------------------------------------------------- Right ventricle: The cavity size was normal. Systolic function was normal.  ------------------------------------------------------------------- Pulmonic valve:  The valve appears to be grossly normal. Doppler: There was no significant regurgitation.  ------------------------------------------------------------------- Tricuspid valve:  Structurally normal valve.  Leaflet separation was normal. Doppler: Transvalvular velocity was within the normal range. There was trivial regurgitation.  ------------------------------------------------------------------- Pulmonary artery:  Systolic pressure was within the normal range.  ------------------------------------------------------------------- Right atrium: The atrium was normal in size.  ------------------------------------------------------------------- Pericardium: The pericardium was normal in appearance.  ------------------------------------------------------------------- Systemic veins: Inferior vena cava: The vessel was normal in size. The respirophasic diameter changes were in the normal range (= 50%), consistent with normal central venous pressure. Diameter: 15 mm.  ------------------------------------------------------------------- Measurements  IVC                    Value     Reference ID                    15  mm    ---------  Left ventricle               Value     Reference Stroke volume, 2D             91  ml    --------- Stroke volume/bsa, 2D           54  ml/m^2  --------- LV e&', lateral              10.2 cm/s   --------- LV E/e&', lateral             8.18      --------- LV e&', medial               8.66 cm/s   --------- LV E/e&', medial              9.63      --------- LV e&', average              9.43 cm/s   --------- LV E/e&', average             8.84      ---------  LVOT                   Value     Reference LVOT ID, S                20  mm    --------- LVOT area  3.14 cm^2   --------- LVOT peak velocity, S           117  cm/s   --------- LVOT mean velocity, S           78.8 cm/s   --------- LVOT VTI, S                29.1 cm    --------- LVOT peak gradient, S           5   mm Hg  ---------  Aortic valve               Value     Reference Aortic peak gradient, S          73  mm Hg  --------- Aortic valve area, VTI          0.85 cm^2   --------- Aortic valve area/bsa, VTI        0.5  cm^2/m^2 --------- Aortic valve area, peak velocity     0.86 cm^2   --------- Aortic valve area/bsa, peak        0.51 cm^2/m^2 --------- velocity  Left atrium                Value     Reference LA volume, S               40  ml    --------- LA volume/bsa, S             23.6 ml/m^2  --------- LA volume, ES, 1-p A4C          34  ml    --------- LA volume/bsa, ES, 1-p A4C        20.1 ml/m^2  --------- LA volume, ES, 1-p A2C          47  ml    --------- LA volume/bsa, ES, 1-p A2C         27.7 ml/m^2  ---------  Mitral valve               Value     Reference Mitral E-wave peak velocity        83.4 cm/s   --------- Mitral A-wave peak velocity        64.2 cm/s   --------- Mitral deceleration time         211  ms    150 - 230 Mitral peak gradient, D          3   mm Hg  --------- Mitral E/A ratio, peak          1.3      ---------  Pulmonary arteries            Value     Reference PA pressure, S, DP            29  mm Hg  <=30  Tricuspid valve              Value     Reference Tricuspid regurg peak velocity      254  cm/s   --------- Tricuspid peak RV-RA gradient       26  mm Hg  --------- Tricuspid maximal regurg velocity,    254  cm/s   --------- PISA  Systemic veins              Value     Reference Estimated CVP               3   mm  Hg  ---------  Right ventricle              Value     Reference RV pressure, S, DP            29  mm Hg  <=30 RV s&', lateral, S             13  cm/s   ---------  Legend: (L) and (H) mark values outside specified reference range.  ------------------------------------------------------------------- Prepared and Electronically Authenticated by  Mertie Moores, M.D.  Indication: 69 yo WF with severe aortic stenosis by Echo with valve area 0.85 cm2 and peak gradient of 73 mm Hg presents with increased fatigue on exertion.   Procedural Details: The right wrist was prepped, draped, and anesthetized with 1% lidocaine. Using the modified Seldinger technique a 6 Fr slender sheath was placed in the right radial artery and a 5 French sheath was placed in the right brachial vein. A Swan-Ganz catheter was used for the right heart catheterization. Standard protocol was followed  for recording of right heart pressures and sampling of oxygen saturations. Fick cardiac output was calculated. Standard Judkins catheters were used for selective coronary angiography and left ventriculography. There were no immediate procedural complications. The patient was transferred to the post catheterization recovery area for further monitoring.  There were no immediate complications during the procedure.    Conclusion     Normal coronary anatomy  Moderate to severe aortic stenosis, mean gradient of 29 mm Hg  valve area 1.1 cm squared, index 0.65  Normal right heart pressures.  Normal cardiac output.  Recommendation: refer to CT surgery to consider AV replacement.      Coronary Findings    Dominance: Right   Left Main  The vessel , is normal in caliber is angiographically normal. Calification at ostium     Left Anterior Descending  The vessel , is normal in caliber is angiographically normal.     Left Circumflex  The vessel , is normal in caliber is angiographically normal.     Right Coronary Artery  The vessel , is normal in caliber is angiographically normal.       Right Heart Pressures Hemodynamic findings consistent with aortic stenosis. LV EDP is normal.    Left Heart    Aortic Valve There is severe aortic valve stenosis. The aortic valve is calcified.    Coronary Diagrams    Diagnostic Diagram            Implants    Name ID Temporary Type Supply   No information to display    Hemo Data       Most Recent Value   Fick Cardiac Output  5.78 L/min   Fick Cardiac Output Index  3.4 (L/min)/BSA   Aortic Mean Gradient  28.9 mmHg   Aortic Peak Gradient  32 mmHg   Aortic Value Area Index  0.65 cm2/BSA   RA A Wave  11 mmHg   RA V Wave  8 mmHg   RA Mean  7 mmHg   RV Systolic Pressure  33 mmHg   RV Diastolic Pressure  2 mmHg   RV EDP  10 mmHg   PA Systolic Pressure  26 mmHg   PA Diastolic Pressure  11 mmHg   PA  Mean  18 mmHg   PW A Wave  17 mmHg   PW V Wave  21 mmHg   PW Mean  15 mmHg   AO Systolic Pressure  629 mmHg  AO Diastolic Pressure  61 mmHg   AO Mean  86 mmHg   LV Systolic Pressure  606 mmHg   LV Diastolic Pressure  0 mmHg   LV EDP  16 mmHg   Arterial Occlusion Pressure Extended Systolic Pressure  301 mmHg   Arterial Occlusion Pressure Extended Diastolic Pressure  63 mmHg   Arterial Occlusion Pressure Extended Mean Pressure  90 mmHg   Left Ventricular Apex Extended Systolic Pressure  601 mmHg   Left Ventricular Apex Extended Diastolic Pressure  0 mmHg   Left Ventricular Apex Extended EDP Pressure  17 mmHg   QP/QS  1   TPVR Index  5.29 HRUI   TSVR Index  25.29 HRUI   PVR SVR Ratio  0.04   TPVR/TSVR Ratio  0.21    Order-Level Documents - 01/16/15:      Scan on 01/31/2015 11:04 AM by Provider Default, MDScan on 01/31/2015 11:04 AM by Provider Default, MD    Encounter-Level Documents - 01/16/15:      Scan on 01/31/2015 11:22 AM by Provider Default, MDScan on 01/31/2015 11:22 AM by Provider Default, MD     Scan on 01/31/2015 11:14 AM by Provider Default, MDScan on 01/31/2015 11:14 AM by Provider Default, MD     Scan on 01/29/2015 10:25 AM by Provider Default, MDScan on 01/29/2015 10:25 AM by Provider Default, MD     Electronic signature on 01/29/2015 7:02 AM     Electronic signature on 01/29/2015 7:01 AM    Signed    Electronically signed by Brelyn Woehl M Martinique, MD on 01/29/15 at 1021 EDT    Assessment / Plan: 1. Severe aortic stenosis with bicuspid aortic valve. Aortic root appears normal in size. Progressive. Plan to proceed with AVR next Tuesday with tissue valve. Questions answered. Will follow up in 6 weeks.

## 2015-02-20 NOTE — Patient Instructions (Signed)
Good luck with your surgery next week!

## 2015-02-21 ENCOUNTER — Encounter: Payer: Self-pay | Admitting: Cardiology

## 2015-02-21 ENCOUNTER — Encounter: Payer: Self-pay | Admitting: Surgery

## 2015-02-22 ENCOUNTER — Ambulatory Visit (HOSPITAL_COMMUNITY)
Admission: RE | Admit: 2015-02-22 | Discharge: 2015-02-22 | Disposition: A | Discharge status: Home / Self Care | Payer: PPO | Source: Ambulatory Visit | Attending: Surgery | Admitting: Surgery

## 2015-02-22 ENCOUNTER — Encounter (HOSPITAL_COMMUNITY)
Admission: RE | Admit: 2015-02-22 | Discharge: 2015-02-22 | Disposition: A | Discharge status: Home / Self Care | Payer: PPO | Source: Ambulatory Visit | Attending: Surgery | Admitting: Surgery

## 2015-02-22 ENCOUNTER — Encounter (HOSPITAL_COMMUNITY): Payer: Self-pay

## 2015-02-22 VITALS — BP 121/51 | HR 75 | Temp 98.6°F | Resp 18 | Ht 68.0 in | Wt 132.0 lb

## 2015-02-22 DIAGNOSIS — Z01818 Encounter for other preprocedural examination: Secondary | ICD-10-CM | POA: Insufficient documentation

## 2015-02-22 DIAGNOSIS — I350 Nonrheumatic aortic (valve) stenosis: Secondary | ICD-10-CM

## 2015-02-22 DIAGNOSIS — Z01812 Encounter for preprocedural laboratory examination: Secondary | ICD-10-CM | POA: Diagnosis not present

## 2015-02-22 DIAGNOSIS — Z0183 Encounter for blood typing: Secondary | ICD-10-CM | POA: Diagnosis not present

## 2015-02-22 HISTORY — DX: Anemia, unspecified: D64.9

## 2015-02-22 HISTORY — DX: Reserved for inherently not codable concepts without codable children: IMO0001

## 2015-02-22 HISTORY — DX: Major depressive disorder, single episode, unspecified: F32.9

## 2015-02-22 HISTORY — DX: Atherosclerotic heart disease of native coronary artery without angina pectoris: I25.10

## 2015-02-22 HISTORY — DX: Depression, unspecified: F32.A

## 2015-02-22 LAB — BLOOD GAS, ARTERIAL
ACID-BASE EXCESS: 1.9 mmol/L (ref 0.0–2.0)
BICARBONATE: 25.6 meq/L — AB (ref 20.0–24.0)
Drawn by: 42180
FIO2: 0.21 %
O2 Saturation: 99.1 %
PCO2 ART: 37.3 mmHg (ref 35.0–45.0)
Patient temperature: 98.6
TCO2: 26.7 mmol/L (ref 0–100)
pH, Arterial: 7.451 — ABNORMAL HIGH (ref 7.350–7.450)
pO2, Arterial: 120 mmHg — ABNORMAL HIGH (ref 80.0–100.0)

## 2015-02-22 LAB — PULMONARY FUNCTION TEST
DL/VA % PRED: 137 %
DL/VA: 7.2 ml/min/mmHg/L
DLCO unc % pred: 112 %
DLCO unc: 33.33 ml/min/mmHg
FEF 25-75 Post: 2.77 L/sec
FEF 25-75 Pre: 2.33 L/sec
FEF2575-%Change-Post: 18 %
FEF2575-%Pred-Post: 121 %
FEF2575-%Pred-Pre: 102 %
FEV1-%Change-Post: 3 %
FEV1-%Pred-Post: 87 %
FEV1-%Pred-Pre: 84 %
FEV1-POST: 2.41 L
FEV1-PRE: 2.33 L
FEV1FVC-%Change-Post: 0 %
FEV1FVC-%Pred-Pre: 105 %
FEV6-%CHANGE-POST: 4 %
FEV6-%PRED-PRE: 82 %
FEV6-%Pred-Post: 85 %
FEV6-Post: 2.96 L
FEV6-Pre: 2.83 L
FEV6FVC-%CHANGE-POST: 0 %
FEV6FVC-%Pred-Post: 104 %
FEV6FVC-%Pred-Pre: 103 %
FVC-%CHANGE-POST: 3 %
FVC-%Pred-Post: 82 %
FVC-%Pred-Pre: 79 %
FVC-Post: 2.96 L
FVC-Pre: 2.85 L
POST FEV1/FVC RATIO: 81 %
PRE FEV1/FVC RATIO: 81 %
PRE FEV6/FVC RATIO: 99 %
Post FEV6/FVC ratio: 100 %
RV % pred: 90 %
RV: 2.11 L
TLC % pred: 88 %
TLC: 4.99 L

## 2015-02-22 LAB — URINALYSIS, ROUTINE W REFLEX MICROSCOPIC
BILIRUBIN URINE: NEGATIVE
GLUCOSE, UA: NEGATIVE mg/dL
HGB URINE DIPSTICK: NEGATIVE
Ketones, ur: NEGATIVE mg/dL
Nitrite: NEGATIVE
PROTEIN: NEGATIVE mg/dL
SPECIFIC GRAVITY, URINE: 1.02 (ref 1.005–1.030)
UROBILINOGEN UA: 0.2 mg/dL (ref 0.0–1.0)
pH: 6 (ref 5.0–8.0)

## 2015-02-22 LAB — CBC
HEMATOCRIT: 40 % (ref 36.0–46.0)
Hemoglobin: 13.7 g/dL (ref 12.0–15.0)
MCH: 32.1 pg (ref 26.0–34.0)
MCHC: 34.3 g/dL (ref 30.0–36.0)
MCV: 93.7 fL (ref 78.0–100.0)
PLATELETS: 165 10*3/uL (ref 150–400)
RBC: 4.27 MIL/uL (ref 3.87–5.11)
RDW: 12.9 % (ref 11.5–15.5)
WBC: 4.4 10*3/uL (ref 4.0–10.5)

## 2015-02-22 LAB — COMPREHENSIVE METABOLIC PANEL
ALBUMIN: 4.2 g/dL (ref 3.5–5.0)
ALK PHOS: 96 U/L (ref 38–126)
ALT: 41 U/L (ref 14–54)
AST: 57 U/L — ABNORMAL HIGH (ref 15–41)
Anion gap: 12 (ref 5–15)
BILIRUBIN TOTAL: 0.3 mg/dL (ref 0.3–1.2)
BUN: 15 mg/dL (ref 6–20)
CALCIUM: 9 mg/dL (ref 8.9–10.3)
CHLORIDE: 99 mmol/L — AB (ref 101–111)
CO2: 26 mmol/L (ref 22–32)
CREATININE: 0.84 mg/dL (ref 0.44–1.00)
GFR calc Af Amer: >60 mL/min (ref >60)
Glucose, Bld: 103 mg/dL — ABNORMAL HIGH (ref 65–99)
POTASSIUM: 3.3 mmol/L — AB (ref 3.5–5.1)
Sodium: 137 mmol/L (ref 135–145)
Total Protein: 7.8 g/dL (ref 6.5–8.1)

## 2015-02-22 LAB — PROTIME-INR
INR: 1.07 (ref 0.00–1.49)
PROTHROMBIN TIME: 14.1 s (ref 11.6–15.2)

## 2015-02-22 LAB — URINE MICROSCOPIC-ADD ON

## 2015-02-22 LAB — APTT: APTT: 26 s (ref 24–37)

## 2015-02-22 LAB — SURGICAL PCR SCREEN
MRSA, PCR: NEGATIVE
STAPHYLOCOCCUS AUREUS: NEGATIVE

## 2015-02-22 LAB — ABO/RH: ABO/RH(D): O POS

## 2015-02-22 MED ORDER — ALBUTEROL SULFATE (2.5 MG/3ML) 0.083% IN NEBU
2.5000 mg | INHALATION_SOLUTION | Freq: Once | RESPIRATORY_TRACT | Status: AC
Start: 2015-02-22 — End: 2015-02-22
  Administered 2015-02-22: 2.5 mg via RESPIRATORY_TRACT

## 2015-02-22 NOTE — Pre-Procedure Instructions (Signed)
MOSETTA FERDINAND  02/22/2015      [facility] [place], [place] - [address] [place] [place] [address] Phone: [phone] Fax: [phone]  [id number] [place], [place] [address] [place] [address] Phone: [phone] Fax: [phone]  [facility] [place], [place] - [address] [place] [address] Phone: [phone] Fax: [phone]  [facility] [place], [place] [address] Phone: [phone] Fax: [phone]    Your procedure is scheduled on June 14  Report to [facility] at 530 A.M.  Call this number if you have problems the morning of surgery:  [phone]   Remember:  Do not eat food or drink liquids after midnight.  Take these medicines the morning of surgery with A SIP OF WATER primidone (Mysoline), estradiol (estrace)  Stop taking aspirin, Aleve, ibuprofen, BC's, Goody's, Herbal medications, and Fish Oil   Do not wear jewelry, make-up or nail polish.  Do not wear lotions, powders, or perfumes.  You may wear deodorant.  Do not shave 48 hours prior to surgery.  Men may shave face and neck.  Do not bring valuables to the hospital.  [facility] is not responsible for any belongings or valuables.  Contacts, dentures or bridgework may not be worn into surgery.  Leave your suitcase in the car.  After surgery it may be brought to your room.  For patients admitted to the hospital, discharge time will be determined by your treatment team.  Patients discharged the day of surgery will not be allowed to drive home.    Special instructions:  [facility] - Preparing for Surgery  Before surgery, you can play an important role.  Because skin is not sterile, your skin needs to be as free of germs as possible.  You  can reduce the number of germs on you skin by washing with CHG (chlorahexidine gluconate) soap before surgery.  CHG is an antiseptic cleaner which kills germs and bonds with the skin to continue killing germs even after washing.  Please DO NOT use if you have an allergy to CHG or antibacterial soaps.  If your skin becomes reddened/irritated stop using the CHG and inform your nurse when you arrive at Short Stay.  Do not shave (including legs and underarms) for at least 48 hours prior to the first CHG shower.  You may shave your face.  Please follow these instructions carefully:   1.  Shower with CHG Soap the night before surgery and the   morning of Surgery.  2.  If you choose to wash your hair, wash your hair first as usual with your   normal shampoo.  3.  After you shampoo, rinse your hair and body thoroughly to remove the Shampoo.  4.  Use CHG as you would any other liquid soap.  You can apply chg directly to the skin and wash gently with scrungie or a clean washcloth.  5.  Apply the CHG Soap to your body ONLY FROM THE NECK DOWN.  Do not use on open wounds or open sores.  Avoid contact with your eyes, ears, mouth and genitals (private parts).  Wash genitals (private parts)  with your normal soap.  6.  Wash thoroughly, paying special attention to the area where your surgery  will be performed.  7.  Thoroughly rinse your body with warm water from the neck down.  8.  DO NOT shower/wash with your normal soap after using and rinsing off  the CHG Soap.  9.  Pat yourself dry with a clean towel.            10.  Wear clean pajamas.            11.  Place clean sheets on your bed the night of your first shower and do not sleep with pets.  Day of Surgery  Do not apply any lotions/deoderants the morning of surgery.  Please wear clean clothes to the hospital/surgery center.     Please read over the following fact sheets that you were given. Pain Booklet, Coughing and Deep Breathing, Blood Transfusion  Information, Open Heart Packet, MRSA Information and Surgical Site Infection Prevention

## 2015-02-22 NOTE — Progress Notes (Deleted)
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  Left:  ICA has been surgically removed per the patient.  Bilateral:  Vertebral artery flow is antegrade.  Upper Extremity Right Left  Brachial Pressures 147 triphasic 141 triphasic  Radial Waveforms triphasic triphasic  Ulnar Waveforms triphasic triphasic  Palmar Arch (Allen's Test) WNL WNL   Findings:  Doppler waveforms remain normal with ulnar and radial compressions bilaterally.    Mavi, Un, RVT 02/22/2015, 1:31 PM

## 2015-02-22 NOTE — Progress Notes (Signed)
VASCULAR LAB PRELIMINARY  PRELIMINARY  PRELIMINARY  PRELIMINARY  Pre-op Cardiac Surgery  Carotid Findings:  Bilateral:  1-39% ICA stenosis.  Vertebral artery flow is antegrade.     Upper Extremity Right Left  Brachial Pressures 133 Triphasic 134 Triphasic  Radial Waveforms Triphasic Triphasic  Ulnar Waveforms Triphasic  Triphasic  Palmar Arch (Allen's Test) WNL WNL   Findings:  Bilateral Doppler waveforms remain normal with radial and ulnar compressions.  Nicole Hess, RVT 02/22/2015, 11:32 AM

## 2015-02-22 NOTE — Progress Notes (Signed)
PCP is Dr Leanna Battles Cardiologist is Dr Peter Martinique Card cath noted in epic from 01-29-15 Echo noted in epic from 10-23-11.

## 2015-02-23 LAB — HEMOGLOBIN A1C
Hgb A1c MFr Bld: 5.5 % (ref 4.8–5.6)
Mean Plasma Glucose: 111 mg/dL

## 2015-02-24 ENCOUNTER — Encounter: Payer: Self-pay | Admitting: Surgery

## 2015-02-25 ENCOUNTER — Encounter (HOSPITAL_COMMUNITY): Payer: Self-pay | Admitting: Certified Registered Nurse Anesthetist

## 2015-02-25 ENCOUNTER — Telehealth: Payer: Self-pay | Admitting: Cardiology

## 2015-02-25 MED ORDER — DEXMEDETOMIDINE HCL IN NACL 400 MCG/100ML IV SOLN
0.1000 ug/kg/h | INTRAVENOUS | Status: DC
  Filled 2015-02-25: qty 100

## 2015-02-25 MED ORDER — PLASMA-LYTE 148 IV SOLN
INTRAVENOUS | Status: DC
  Filled 2015-02-25: qty 2.5

## 2015-02-25 MED ORDER — SODIUM CHLORIDE 0.9 % IV SOLN
INTRAVENOUS | Status: DC
  Filled 2015-02-25: qty 30

## 2015-02-25 MED ORDER — SODIUM CHLORIDE 0.9 % IV SOLN
INTRAVENOUS | Status: DC
  Filled 2015-02-25: qty 2.5

## 2015-02-25 MED ORDER — SODIUM CHLORIDE 0.9 % IV SOLN
INTRAVENOUS | Status: DC
  Filled 2015-02-25: qty 40

## 2015-02-25 MED ORDER — POTASSIUM CHLORIDE 2 MEQ/ML IV SOLN
80.0000 meq | INTRAVENOUS | Status: DC
  Filled 2015-02-25: qty 40

## 2015-02-25 MED ORDER — PHENYLEPHRINE HCL 10 MG/ML IJ SOLN
30.0000 ug/min | INTRAVENOUS | Status: DC
  Filled 2015-02-25: qty 2

## 2015-02-25 MED ORDER — VANCOMYCIN HCL 10 G IV SOLR
1250.0000 mg | INTRAVENOUS | Status: AC
  Administered 2015-02-26: 1250 mg via INTRAVENOUS
  Filled 2015-02-25: qty 1250

## 2015-02-25 MED ORDER — CEFUROXIME SODIUM 1.5 G IJ SOLR
1.5000 g | INTRAMUSCULAR | Status: AC
  Administered 2015-02-26: 1.5 g via INTRAVENOUS
  Administered 2015-02-26: .75 g via INTRAVENOUS
  Filled 2015-02-25 (×2): qty 1.5

## 2015-02-25 MED ORDER — MAGNESIUM SULFATE 50 % IJ SOLN
40.0000 meq | INTRAMUSCULAR | Status: DC
  Filled 2015-02-25: qty 10

## 2015-02-25 MED ORDER — DEXTROSE 5 % IV SOLN
750.0000 mg | INTRAVENOUS | Status: DC
  Filled 2015-02-25: qty 750

## 2015-02-25 MED ORDER — DOPAMINE-DEXTROSE 3.2-5 MG/ML-% IV SOLN
0.0000 ug/kg/min | INTRAVENOUS | Status: DC
  Filled 2015-02-25: qty 250

## 2015-02-25 MED ORDER — EPINEPHRINE HCL 1 MG/ML IJ SOLN
0.0000 ug/min | INTRAVENOUS | Status: DC
  Filled 2015-02-25: qty 4

## 2015-02-25 MED ORDER — NITROGLYCERIN IN D5W 200-5 MCG/ML-% IV SOLN
2.0000 ug/min | INTRAVENOUS | Status: AC
Start: 2015-02-26 — End: 2015-02-26
  Administered 2015-02-26: 5 ug/min via INTRAVENOUS
  Filled 2015-02-25: qty 250

## 2015-02-25 NOTE — Telephone Encounter (Signed)
Pt says she can not get Mupirocin over the counter. They say she needs a prescription.

## 2015-02-25 NOTE — Telephone Encounter (Signed)
Returned call to patient spoke to Cecilie Kicks NP she advised she does not need mupirocin oint.

## 2015-02-26 ENCOUNTER — Inpatient Hospital Stay (HOSPITAL_COMMUNITY): Payer: PPO

## 2015-02-26 ENCOUNTER — Inpatient Hospital Stay (HOSPITAL_COMMUNITY)
Admission: RE | Admit: 2015-02-26 | Discharge: 2015-03-04 | DRG: 220 | Disposition: A | Discharge status: Home / Self Care | Payer: PPO | Source: Ambulatory Visit | Attending: Surgery | Admitting: Surgery

## 2015-02-26 ENCOUNTER — Inpatient Hospital Stay (HOSPITAL_COMMUNITY): Payer: PPO | Admitting: Certified Registered Nurse Anesthetist

## 2015-02-26 ENCOUNTER — Encounter (HOSPITAL_COMMUNITY): Payer: Self-pay | Admitting: Surgery

## 2015-02-26 ENCOUNTER — Inpatient Hospital Stay (HOSPITAL_COMMUNITY): Payer: PPO | Admitting: Vascular Surgery

## 2015-02-26 ENCOUNTER — Encounter (HOSPITAL_COMMUNITY)
Admission: RE | Disposition: A | Discharge status: Home / Self Care | Payer: PPO | Source: Ambulatory Visit | Attending: Surgery

## 2015-02-26 DIAGNOSIS — Z880 Allergy status to penicillin: Secondary | ICD-10-CM | POA: Diagnosis not present

## 2015-02-26 DIAGNOSIS — D62 Acute posthemorrhagic anemia: Secondary | ICD-10-CM | POA: Diagnosis not present

## 2015-02-26 DIAGNOSIS — Q231 Congenital insufficiency of aortic valve: Principal | ICD-10-CM

## 2015-02-26 DIAGNOSIS — F419 Anxiety disorder, unspecified: Secondary | ICD-10-CM | POA: Diagnosis present

## 2015-02-26 DIAGNOSIS — Q230 Congenital stenosis of aortic valve: Secondary | ICD-10-CM

## 2015-02-26 DIAGNOSIS — D696 Thrombocytopenia, unspecified: Secondary | ICD-10-CM | POA: Diagnosis not present

## 2015-02-26 DIAGNOSIS — E8770 Fluid overload, unspecified: Secondary | ICD-10-CM | POA: Diagnosis not present

## 2015-02-26 DIAGNOSIS — Z952 Presence of prosthetic heart valve: Secondary | ICD-10-CM

## 2015-02-26 DIAGNOSIS — R0602 Shortness of breath: Secondary | ICD-10-CM

## 2015-02-26 DIAGNOSIS — I350 Nonrheumatic aortic (valve) stenosis: Secondary | ICD-10-CM | POA: Diagnosis present

## 2015-02-26 HISTORY — PX: AORTIC VALVE REPLACEMENT: SHX41

## 2015-02-26 HISTORY — PX: TEE WITHOUT CARDIOVERSION: SHX5443

## 2015-02-26 LAB — GLUCOSE, CAPILLARY
GLUCOSE-CAPILLARY: 139 mg/dL — AB (ref 65–99)
Glucose-Capillary: 114 mg/dL — ABNORMAL HIGH (ref 65–99)
Glucose-Capillary: 118 mg/dL — ABNORMAL HIGH (ref 65–99)
Glucose-Capillary: 121 mg/dL — ABNORMAL HIGH (ref 65–99)
Glucose-Capillary: 146 mg/dL — ABNORMAL HIGH (ref 65–99)
Glucose-Capillary: 121 mg/dL — ABNORMAL HIGH (ref 65–99)

## 2015-02-26 LAB — POCT I-STAT, CHEM 8
BUN: 19 mg/dL (ref 6–20)
BUN: 17 mg/dL (ref 6–20)
BUN: 16 mg/dL (ref 6–20)
BUN: 16 mg/dL (ref 6–20)
BUN: 15 mg/dL (ref 6–20)
BUN: 14 mg/dL (ref 6–20)
CALCIUM ION: 1.19 mmol/L (ref 1.13–1.30)
CALCIUM ION: 1.18 mmol/L (ref 1.13–1.30)
CALCIUM ION: 1.12 mmol/L — AB (ref 1.13–1.30)
CALCIUM ION: 1.05 mmol/L — AB (ref 1.13–1.30)
CHLORIDE: 104 mmol/L (ref 101–111)
CHLORIDE: 112 mmol/L — AB (ref 101–111)
CREATININE: 0.6 mg/dL (ref 0.44–1.00)
CREATININE: 0.6 mg/dL (ref 0.44–1.00)
CREATININE: 0.5 mg/dL (ref 0.44–1.00)
CREATININE: 0.7 mg/dL (ref 0.44–1.00)
Calcium, Ion: 1.12 mmol/L — ABNORMAL LOW (ref 1.13–1.30)
Calcium, Ion: 1.18 mmol/L (ref 1.13–1.30)
Chloride: 105 mmol/L (ref 101–111)
Chloride: 104 mmol/L (ref 101–111)
Chloride: 106 mmol/L (ref 101–111)
Chloride: 102 mmol/L (ref 101–111)
Creatinine, Ser: 0.5 mg/dL (ref 0.44–1.00)
Creatinine, Ser: 0.5 mg/dL (ref 0.44–1.00)
GLUCOSE: 125 mg/dL — AB (ref 65–99)
Glucose, Bld: 94 mg/dL (ref 65–99)
Glucose, Bld: 105 mg/dL — ABNORMAL HIGH (ref 65–99)
Glucose, Bld: 111 mg/dL — ABNORMAL HIGH (ref 65–99)
Glucose, Bld: 128 mg/dL — ABNORMAL HIGH (ref 65–99)
Glucose, Bld: 137 mg/dL — ABNORMAL HIGH (ref 65–99)
HCT: 32 % — ABNORMAL LOW (ref 36.0–46.0)
HCT: 30 % — ABNORMAL LOW (ref 36.0–46.0)
HCT: 26 % — ABNORMAL LOW (ref 36.0–46.0)
HCT: 32 % — ABNORMAL LOW (ref 36.0–46.0)
HEMATOCRIT: 25 % — AB (ref 36.0–46.0)
HEMATOCRIT: 23 % — AB (ref 36.0–46.0)
HEMOGLOBIN: 10.2 g/dL — AB (ref 12.0–15.0)
HEMOGLOBIN: 8.5 g/dL — AB (ref 12.0–15.0)
HEMOGLOBIN: 10.9 g/dL — AB (ref 12.0–15.0)
Hemoglobin: 10.9 g/dL — ABNORMAL LOW (ref 12.0–15.0)
Hemoglobin: 8.8 g/dL — ABNORMAL LOW (ref 12.0–15.0)
Hemoglobin: 7.8 g/dL — ABNORMAL LOW (ref 12.0–15.0)
POTASSIUM: 5.7 mmol/L — AB (ref 3.5–5.1)
POTASSIUM: 3.9 mmol/L (ref 3.5–5.1)
Potassium: 4.1 mmol/L (ref 3.5–5.1)
Potassium: 3.6 mmol/L (ref 3.5–5.1)
Potassium: 4.3 mmol/L (ref 3.5–5.1)
Potassium: 4.2 mmol/L (ref 3.5–5.1)
SODIUM: 139 mmol/L (ref 135–145)
SODIUM: 139 mmol/L (ref 135–145)
Sodium: 139 mmol/L (ref 135–145)
Sodium: 140 mmol/L (ref 135–145)
Sodium: 137 mmol/L (ref 135–145)
Sodium: 139 mmol/L (ref 135–145)
TCO2: 26 mmol/L (ref 0–100)
TCO2: 25 mmol/L (ref 0–100)
TCO2: 25 mmol/L (ref 0–100)
TCO2: 25 mmol/L (ref 0–100)
TCO2: 25 mmol/L (ref 0–100)
TCO2: 24 mmol/L (ref 0–100)

## 2015-02-26 LAB — CBC
HEMATOCRIT: 30.8 % — AB (ref 36.0–46.0)
HEMATOCRIT: 29.2 % — AB (ref 36.0–46.0)
HEMOGLOBIN: 10.4 g/dL — AB (ref 12.0–15.0)
HEMOGLOBIN: 10 g/dL — AB (ref 12.0–15.0)
MCH: 31.4 pg (ref 26.0–34.0)
MCH: 31.8 pg (ref 26.0–34.0)
MCHC: 33.8 g/dL (ref 30.0–36.0)
MCHC: 34.2 g/dL (ref 30.0–36.0)
MCV: 93.1 fL (ref 78.0–100.0)
MCV: 93 fL (ref 78.0–100.0)
PLATELETS: 146 10*3/uL — AB (ref 150–400)
Platelets: 119 10*3/uL — ABNORMAL LOW (ref 150–400)
RBC: 3.31 MIL/uL — ABNORMAL LOW (ref 3.87–5.11)
RBC: 3.14 MIL/uL — ABNORMAL LOW (ref 3.87–5.11)
RDW: 12.9 % (ref 11.5–15.5)
RDW: 13.1 % (ref 11.5–15.5)
WBC: 3.4 10*3/uL — ABNORMAL LOW (ref 4.0–10.5)
WBC: 11.5 10*3/uL — ABNORMAL HIGH (ref 4.0–10.5)

## 2015-02-26 LAB — CREATININE, SERUM
CREATININE: 0.72 mg/dL (ref 0.44–1.00)
GFR calc Af Amer: >60 mL/min (ref >60)
GFR calc non Af Amer: >60 mL/min (ref >60)

## 2015-02-26 LAB — PROTIME-INR
INR: 1.5 — ABNORMAL HIGH (ref 0.00–1.49)
Prothrombin Time: 18.2 seconds — ABNORMAL HIGH (ref 11.6–15.2)

## 2015-02-26 LAB — POCT I-STAT 3, ART BLOOD GAS (G3+)
ACID-BASE EXCESS: 4 mmol/L — AB (ref 0.0–2.0)
Acid-Base Excess: 2 mmol/L (ref 0.0–2.0)
Acid-Base Excess: 2 mmol/L (ref 0.0–2.0)
BICARBONATE: 27.4 meq/L — AB (ref 20.0–24.0)
BICARBONATE: 27.9 meq/L — AB (ref 20.0–24.0)
BICARBONATE: 24.8 meq/L — AB (ref 20.0–24.0)
BICARBONATE: 28.3 meq/L — AB (ref 20.0–24.0)
Bicarbonate: 25.8 mEq/L — ABNORMAL HIGH (ref 20.0–24.0)
O2 Saturation: 100 %
O2 Saturation: 100 %
O2 Saturation: 100 %
O2 Saturation: 99 %
O2 Saturation: 100 %
PCO2 ART: 45.7 mmHg — AB (ref 35.0–45.0)
PH ART: 7.443 (ref 7.350–7.450)
PH ART: 7.509 — AB (ref 7.350–7.450)
PH ART: 7.361 (ref 7.350–7.450)
PO2 ART: 260 mmHg — AB (ref 80.0–100.0)
PO2 ART: 199 mmHg — AB (ref 80.0–100.0)
PO2 ART: 148 mmHg — AB (ref 80.0–100.0)
Patient temperature: 33.5
Patient temperature: 36.2
TCO2: 29 mmol/L (ref 0–100)
TCO2: 29 mmol/L (ref 0–100)
TCO2: 26 mmol/L (ref 0–100)
TCO2: 30 mmol/L (ref 0–100)
TCO2: 27 mmol/L (ref 0–100)
pCO2 arterial: 38.7 mmHg (ref 35.0–45.0)
pCO2 arterial: 34.9 mmHg — ABNORMAL LOW (ref 35.0–45.0)
pCO2 arterial: 39.6 mmHg (ref 35.0–45.0)
pCO2 arterial: 50.1 mmHg — ABNORMAL HIGH (ref 35.0–45.0)
pH, Arterial: 7.406 (ref 7.350–7.450)
pH, Arterial: 7.359 (ref 7.350–7.450)
pO2, Arterial: 239 mmHg — ABNORMAL HIGH (ref 80.0–100.0)
pO2, Arterial: 207 mmHg — ABNORMAL HIGH (ref 80.0–100.0)

## 2015-02-26 LAB — POCT I-STAT 3, VENOUS BLOOD GAS (G3P V)
Acid-Base Excess: 1 mmol/L (ref 0.0–2.0)
BICARBONATE: 26 meq/L — AB (ref 20.0–24.0)
O2 SAT: 77 %
PO2 VEN: 34 mmHg (ref 30.0–45.0)
Patient temperature: 33.5
TCO2: 27 mmol/L (ref 0–100)
pCO2, Ven: 38.2 mmHg — ABNORMAL LOW (ref 45.0–50.0)
pH, Ven: 7.426 — ABNORMAL HIGH (ref 7.250–7.300)

## 2015-02-26 LAB — POCT I-STAT 4, (NA,K, GLUC, HGB,HCT)
Glucose, Bld: 93 mg/dL (ref 65–99)
HCT: 29 % — ABNORMAL LOW (ref 36.0–46.0)
HEMOGLOBIN: 9.9 g/dL — AB (ref 12.0–15.0)
Potassium: 3.5 mmol/L (ref 3.5–5.1)
SODIUM: 141 mmol/L (ref 135–145)

## 2015-02-26 LAB — HEMOGLOBIN AND HEMATOCRIT, BLOOD
HEMATOCRIT: 23.5 % — AB (ref 36.0–46.0)
HEMOGLOBIN: 8.1 g/dL — AB (ref 12.0–15.0)

## 2015-02-26 LAB — PLATELET COUNT: Platelets: 93 10*3/uL — ABNORMAL LOW (ref 150–400)

## 2015-02-26 LAB — MAGNESIUM: Magnesium: 3.1 mg/dL — ABNORMAL HIGH (ref 1.7–2.4)

## 2015-02-26 LAB — APTT: aPTT: 39 seconds — ABNORMAL HIGH (ref 24–37)

## 2015-02-26 SURGERY — REPLACEMENT, AORTIC VALVE, OPEN
Anesthesia: General | Site: Chest

## 2015-02-26 MED ORDER — INSULIN REGULAR BOLUS VIA INFUSION
0.0000 [IU] | Freq: Three times a day (TID) | INTRAVENOUS | Status: DC
  Filled 2015-02-26: qty 10

## 2015-02-26 MED ORDER — CHLORHEXIDINE GLUCONATE 0.12 % MT SOLN
15.0000 mL | Freq: Two times a day (BID) | OROMUCOSAL | Status: DC
  Administered 2015-02-26 – 2015-02-28 (×6): 15 mL via OROMUCOSAL
  Filled 2015-02-26 (×6): qty 15

## 2015-02-26 MED ORDER — PRAVASTATIN SODIUM 80 MG PO TABS
80.0000 mg | ORAL_TABLET | Freq: Every day | ORAL | Status: DC
  Administered 2015-02-27 – 2015-03-04 (×6): 80 mg via ORAL
  Filled 2015-02-26 (×6): qty 1

## 2015-02-26 MED ORDER — ACETAMINOPHEN 160 MG/5ML PO SOLN
1000.0000 mg | Freq: Four times a day (QID) | ORAL | Status: AC
  Filled 2015-02-26: qty 40

## 2015-02-26 MED ORDER — EPHEDRINE SULFATE 50 MG/ML IJ SOLN
INTRAMUSCULAR | Status: DC | PRN
  Administered 2015-02-26: 10 mg via INTRAVENOUS

## 2015-02-26 MED ORDER — LACTATED RINGERS IV SOLN
INTRAVENOUS | Status: DC | PRN
  Administered 2015-02-26 (×2): via INTRAVENOUS

## 2015-02-26 MED ORDER — PHENYLEPHRINE HCL 10 MG/ML IJ SOLN
0.0000 ug/min | INTRAVENOUS | Status: DC
  Administered 2015-02-26: 35 ug/min via INTRAVENOUS
  Administered 2015-02-27: 25 ug/min via INTRAVENOUS
  Administered 2015-02-28: 20 ug/min via INTRAVENOUS
  Administered 2015-02-28: 50 ug/min via INTRAVENOUS
  Filled 2015-02-26 (×6): qty 2

## 2015-02-26 MED ORDER — FENTANYL CITRATE (PF) 250 MCG/5ML IJ SOLN
INTRAMUSCULAR | Status: AC
  Filled 2015-02-26: qty 5

## 2015-02-26 MED ORDER — ALBUMIN HUMAN 5 % IV SOLN
250.0000 mL | INTRAVENOUS | Status: AC | PRN
  Administered 2015-02-26 – 2015-02-27 (×3): 250 mL via INTRAVENOUS
  Filled 2015-02-26: qty 250

## 2015-02-26 MED ORDER — SODIUM CHLORIDE 0.9 % IV SOLN
10.0000 g | INTRAVENOUS | Status: DC | PRN
  Administered 2015-02-26: 5 g/h via INTRAVENOUS

## 2015-02-26 MED ORDER — TRAMADOL HCL 50 MG PO TABS
50.0000 mg | ORAL_TABLET | ORAL | Status: DC | PRN
  Administered 2015-02-27 – 2015-02-28 (×2): 50 mg via ORAL
  Filled 2015-02-26 (×2): qty 1

## 2015-02-26 MED ORDER — VANCOMYCIN HCL IN DEXTROSE 1-5 GM/200ML-% IV SOLN
1000.0000 mg | Freq: Once | INTRAVENOUS | Status: AC
  Administered 2015-02-26: 1000 mg via INTRAVENOUS
  Filled 2015-02-26: qty 200

## 2015-02-26 MED ORDER — ACETAMINOPHEN 650 MG RE SUPP
650.0000 mg | Freq: Once | RECTAL | Status: AC
  Administered 2015-02-26: 650 mg via RECTAL

## 2015-02-26 MED ORDER — MIDAZOLAM HCL 5 MG/5ML IJ SOLN
INTRAMUSCULAR | Status: DC | PRN
  Administered 2015-02-26: 5 mg via INTRAVENOUS
  Administered 2015-02-26: 1 mg via INTRAVENOUS
  Administered 2015-02-26: 3 mg via INTRAVENOUS
  Administered 2015-02-26: 1 mg via INTRAVENOUS

## 2015-02-26 MED ORDER — SODIUM CHLORIDE 0.9 % IJ SOLN
3.0000 mL | Freq: Two times a day (BID) | INTRAMUSCULAR | Status: DC
  Administered 2015-02-27 – 2015-03-01 (×5): 3 mL via INTRAVENOUS
  Administered 2015-03-01: 10 mL via INTRAVENOUS
  Administered 2015-03-02 – 2015-03-04 (×5): 3 mL via INTRAVENOUS

## 2015-02-26 MED ORDER — SODIUM CHLORIDE 0.9 % IV SOLN: 250.0000 mL | INTRAVENOUS | Status: DC

## 2015-02-26 MED ORDER — FAMOTIDINE IN NACL 20-0.9 MG/50ML-% IV SOLN
20.0000 mg | Freq: Two times a day (BID) | INTRAVENOUS | Status: DC
  Administered 2015-02-26: 20 mg via INTRAVENOUS

## 2015-02-26 MED ORDER — ROCURONIUM BROMIDE 100 MG/10ML IV SOLN
INTRAVENOUS | Status: DC | PRN
  Administered 2015-02-26 (×2): 50 mg via INTRAVENOUS

## 2015-02-26 MED ORDER — DOCUSATE SODIUM 100 MG PO CAPS
200.0000 mg | ORAL_CAPSULE | Freq: Every day | ORAL | Status: DC
  Administered 2015-02-27 – 2015-03-04 (×4): 200 mg via ORAL
  Filled 2015-02-26 (×5): qty 2

## 2015-02-26 MED ORDER — CETYLPYRIDINIUM CHLORIDE 0.05 % MT LIQD
7.0000 mL | Freq: Four times a day (QID) | OROMUCOSAL | Status: DC
  Administered 2015-02-26 – 2015-03-04 (×14): 7 mL via OROMUCOSAL

## 2015-02-26 MED ORDER — INSULIN ASPART 100 UNIT/ML ~~LOC~~ SOLN
0.0000 [IU] | SUBCUTANEOUS | Status: DC
  Administered 2015-02-26 – 2015-02-27 (×5): 2 [IU] via SUBCUTANEOUS

## 2015-02-26 MED ORDER — STERILE WATER FOR INJECTION IJ SOLN
INTRAMUSCULAR | Status: AC
  Filled 2015-02-26: qty 30

## 2015-02-26 MED ORDER — BISACODYL 10 MG RE SUPP: 10.0000 mg | Freq: Every day | RECTAL | Status: DC

## 2015-02-26 MED ORDER — PROPOFOL 10 MG/ML IV BOLUS
INTRAVENOUS | Status: DC | PRN
  Administered 2015-02-26: 90 mg via INTRAVENOUS

## 2015-02-26 MED ORDER — PHENYLEPHRINE 40 MCG/ML (10ML) SYRINGE FOR IV PUSH (FOR BLOOD PRESSURE SUPPORT)
PREFILLED_SYRINGE | INTRAVENOUS | Status: AC
  Filled 2015-02-26: qty 40

## 2015-02-26 MED ORDER — INSULIN REGULAR HUMAN 100 UNIT/ML IJ SOLN
250.0000 [IU] | INTRAMUSCULAR | Status: DC | PRN
  Administered 2015-02-26: 1 [IU]/h via INTRAVENOUS

## 2015-02-26 MED ORDER — ALBUMIN HUMAN 5 % IV SOLN
INTRAVENOUS | Status: DC | PRN
  Administered 2015-02-26: 11:00:00 via INTRAVENOUS

## 2015-02-26 MED ORDER — ESTRADIOL 1 MG PO TABS
0.5000 mg | ORAL_TABLET | Freq: Every day | ORAL | Status: DC
  Administered 2015-02-27 – 2015-03-04 (×6): 0.5 mg via ORAL
  Filled 2015-02-26 (×6): qty 0.5

## 2015-02-26 MED ORDER — ACETAMINOPHEN 500 MG PO TABS
1000.0000 mg | ORAL_TABLET | Freq: Four times a day (QID) | ORAL | Status: AC
  Administered 2015-02-27 – 2015-03-03 (×17): 1000 mg via ORAL
  Filled 2015-02-26 (×19): qty 2

## 2015-02-26 MED ORDER — LACTATED RINGERS IV SOLN
INTRAVENOUS | Status: DC | PRN
  Administered 2015-02-26 (×2): via INTRAVENOUS

## 2015-02-26 MED ORDER — ASPIRIN 81 MG PO CHEW: 324.0000 mg | CHEWABLE_TABLET | Freq: Every day | ORAL | Status: DC

## 2015-02-26 MED ORDER — MIDAZOLAM HCL 10 MG/2ML IJ SOLN
INTRAMUSCULAR | Status: AC
  Filled 2015-02-26: qty 2

## 2015-02-26 MED ORDER — METOPROLOL TARTRATE 12.5 MG HALF TABLET
12.5000 mg | ORAL_TABLET | Freq: Once | ORAL | Status: AC
  Administered 2015-02-26: 12.5 mg via ORAL
  Filled 2015-02-26: qty 1

## 2015-02-26 MED ORDER — METOPROLOL TARTRATE 25 MG/10 ML ORAL SUSPENSION
12.5000 mg | Freq: Two times a day (BID) | ORAL | Status: DC
Start: 2015-02-26 — End: 2015-02-27
  Filled 2015-02-26 (×3): qty 5

## 2015-02-26 MED ORDER — ADULT MULTIVITAMIN W/MINERALS CH
1.0000 | ORAL_TABLET | Freq: Every day | ORAL | Status: DC
  Administered 2015-02-27 – 2015-03-04 (×6): 1 via ORAL
  Filled 2015-02-26 (×6): qty 1

## 2015-02-26 MED ORDER — PROTAMINE SULFATE 10 MG/ML IV SOLN
INTRAVENOUS | Status: DC | PRN
  Administered 2015-02-26: 60 mg via INTRAVENOUS
  Administered 2015-02-26: 30 mg via INTRAVENOUS
  Administered 2015-02-26: 50 mg via INTRAVENOUS
  Administered 2015-02-26: 30 mg via INTRAVENOUS

## 2015-02-26 MED ORDER — NITROGLYCERIN IN D5W 200-5 MCG/ML-% IV SOLN: 0.0000 ug/min | INTRAVENOUS | Status: DC

## 2015-02-26 MED ORDER — LACTATED RINGERS IV SOLN: 500.0000 mL | Freq: Once | INTRAVENOUS | Status: AC | PRN

## 2015-02-26 MED ORDER — PRIMIDONE 250 MG PO TABS
250.0000 mg | ORAL_TABLET | Freq: Two times a day (BID) | ORAL | Status: DC
  Administered 2015-02-27 – 2015-03-04 (×11): 250 mg via ORAL
  Filled 2015-02-26 (×13): qty 1

## 2015-02-26 MED ORDER — ACETAMINOPHEN 160 MG/5ML PO SOLN: 650.0000 mg | Freq: Once | ORAL | Status: AC

## 2015-02-26 MED ORDER — HEMOSTATIC AGENTS (NO CHARGE) OPTIME
TOPICAL | Status: DC | PRN
  Administered 2015-02-26: 1 via TOPICAL

## 2015-02-26 MED ORDER — 0.9 % SODIUM CHLORIDE (POUR BTL) OPTIME
TOPICAL | Status: DC | PRN
  Administered 2015-02-26 (×4): 1000 mL

## 2015-02-26 MED ORDER — MAGNESIUM SULFATE 4 GM/100ML IV SOLN
4.0000 g | Freq: Once | INTRAVENOUS | Status: AC
  Administered 2015-02-26: 4 g via INTRAVENOUS
  Filled 2015-02-26: qty 100

## 2015-02-26 MED ORDER — FENTANYL CITRATE (PF) 100 MCG/2ML IJ SOLN
INTRAMUSCULAR | Status: DC | PRN
  Administered 2015-02-26: 150 ug via INTRAVENOUS
  Administered 2015-02-26: 250 ug via INTRAVENOUS
  Administered 2015-02-26 (×2): 50 ug via INTRAVENOUS
  Administered 2015-02-26 (×2): 250 ug via INTRAVENOUS

## 2015-02-26 MED ORDER — THROMBIN 20000 UNITS EX SOLR
CUTANEOUS | Status: AC
  Filled 2015-02-26: qty 20000

## 2015-02-26 MED ORDER — LACTATED RINGERS IV SOLN
INTRAVENOUS | Status: DC
  Administered 2015-02-26: 13:00:00 via INTRAVENOUS

## 2015-02-26 MED ORDER — ROCURONIUM BROMIDE 50 MG/5ML IV SOLN
INTRAVENOUS | Status: AC
  Filled 2015-02-26: qty 2

## 2015-02-26 MED ORDER — MIDAZOLAM HCL 2 MG/2ML IJ SOLN
2.0000 mg | INTRAMUSCULAR | Status: DC | PRN
Start: 2015-02-26 — End: 2015-02-27
  Administered 2015-02-26: 2 mg via INTRAVENOUS
  Filled 2015-02-26: qty 2

## 2015-02-26 MED ORDER — ASPIRIN EC 325 MG PO TBEC
325.0000 mg | DELAYED_RELEASE_TABLET | Freq: Every day | ORAL | Status: DC
  Administered 2015-02-27 – 2015-03-04 (×6): 325 mg via ORAL
  Filled 2015-02-26 (×6): qty 1

## 2015-02-26 MED ORDER — BISACODYL 5 MG PO TBEC
10.0000 mg | DELAYED_RELEASE_TABLET | Freq: Every day | ORAL | Status: DC
  Administered 2015-02-27 – 2015-03-01 (×2): 10 mg via ORAL
  Filled 2015-02-26 (×3): qty 2

## 2015-02-26 MED ORDER — PROPOFOL 10 MG/ML IV BOLUS
INTRAVENOUS | Status: AC
  Filled 2015-02-26: qty 20

## 2015-02-26 MED ORDER — OXYCODONE HCL 5 MG PO TABS: 5.0000 mg | ORAL_TABLET | ORAL | Status: DC | PRN

## 2015-02-26 MED ORDER — POTASSIUM CHLORIDE 10 MEQ/50ML IV SOLN
10.0000 meq | INTRAVENOUS | Status: AC
  Administered 2015-02-26 (×3): 10 meq via INTRAVENOUS

## 2015-02-26 MED ORDER — MORPHINE SULFATE 2 MG/ML IJ SOLN
2.0000 mg | INTRAMUSCULAR | Status: DC | PRN
  Administered 2015-02-27: 2 mg via INTRAVENOUS
  Filled 2015-02-26: qty 1

## 2015-02-26 MED ORDER — SODIUM CHLORIDE 0.9 % IV SOLN
INTRAVENOUS | Status: DC
  Administered 2015-02-26: 12:00:00 via INTRAVENOUS

## 2015-02-26 MED ORDER — SODIUM CHLORIDE 0.9 % IJ SOLN
INTRAMUSCULAR | Status: AC
  Filled 2015-02-26: qty 20

## 2015-02-26 MED ORDER — DEXTROSE 5 % IV SOLN
1.5000 g | Freq: Two times a day (BID) | INTRAVENOUS | Status: AC
  Administered 2015-02-26 – 2015-02-28 (×4): 1.5 g via INTRAVENOUS
  Filled 2015-02-26 (×4): qty 1.5

## 2015-02-26 MED ORDER — HEPARIN SODIUM (PORCINE) 1000 UNIT/ML IJ SOLN
INTRAMUSCULAR | Status: AC
  Filled 2015-02-26: qty 1

## 2015-02-26 MED ORDER — METOPROLOL TARTRATE 12.5 MG HALF TABLET
12.5000 mg | ORAL_TABLET | Freq: Two times a day (BID) | ORAL | Status: DC
  Filled 2015-02-26 (×3): qty 1

## 2015-02-26 MED ORDER — SODIUM CHLORIDE 0.45 % IV SOLN: INTRAVENOUS | Status: DC | PRN

## 2015-02-26 MED ORDER — CHLORHEXIDINE GLUCONATE 4 % EX LIQD: 30.0000 mL | CUTANEOUS | Status: DC

## 2015-02-26 MED ORDER — METOPROLOL TARTRATE 1 MG/ML IV SOLN: 2.5000 mg | INTRAVENOUS | Status: DC | PRN

## 2015-02-26 MED ORDER — MORPHINE SULFATE 2 MG/ML IJ SOLN: 1.0000 mg | INTRAMUSCULAR | Status: AC | PRN

## 2015-02-26 MED ORDER — ONDANSETRON HCL 4 MG/2ML IJ SOLN
4.0000 mg | Freq: Four times a day (QID) | INTRAMUSCULAR | Status: DC | PRN
  Administered 2015-02-26 – 2015-02-27 (×2): 4 mg via INTRAVENOUS
  Filled 2015-02-26 (×2): qty 2

## 2015-02-26 MED ORDER — LEVOFLOXACIN IN D5W 750 MG/150ML IV SOLN: 750.0000 mg | INTRAVENOUS | Status: DC

## 2015-02-26 MED ORDER — SODIUM CHLORIDE 0.9 % IJ SOLN: 3.0000 mL | INTRAMUSCULAR | Status: DC | PRN

## 2015-02-26 MED ORDER — DEXMEDETOMIDINE HCL IN NACL 200 MCG/50ML IV SOLN
0.0000 ug/kg/h | INTRAVENOUS | Status: DC
Start: 2015-02-26 — End: 2015-02-28
  Administered 2015-02-26 (×2): 0.7 ug/kg/h via INTRAVENOUS
  Filled 2015-02-26: qty 50

## 2015-02-26 MED ORDER — HEPARIN SODIUM (PORCINE) 1000 UNIT/ML IJ SOLN
INTRAMUSCULAR | Status: DC | PRN
Start: 2015-02-26 — End: 2015-02-26
  Administered 2015-02-26: 16 mL via INTRAVENOUS

## 2015-02-26 MED ORDER — SODIUM CHLORIDE 0.9 % IV SOLN
INTRAVENOUS | Status: DC
  Administered 2015-02-26: 13:00:00 via INTRAVENOUS
  Filled 2015-02-26 (×2): qty 2.5

## 2015-02-26 MED ORDER — PHENYLEPHRINE HCL 10 MG/ML IJ SOLN
10.0000 mg | INTRAVENOUS | Status: DC | PRN
  Administered 2015-02-26: 10 ug/min via INTRAVENOUS

## 2015-02-26 MED ORDER — SODIUM CHLORIDE 0.9 % IV SOLN
200.0000 ug | INTRAVENOUS | Status: DC | PRN
  Administered 2015-02-26: 0.2 ug/kg/h via INTRAVENOUS

## 2015-02-26 MED ORDER — VECURONIUM BROMIDE 10 MG IV SOLR
INTRAVENOUS | Status: DC | PRN
  Administered 2015-02-26: 5 mg via INTRAVENOUS

## 2015-02-26 MED ORDER — THROMBIN 20000 UNITS EX KIT
PACK | CUTANEOUS | Status: DC | PRN
  Administered 2015-02-26: 20 mL via TOPICAL

## 2015-02-26 MED ORDER — PANTOPRAZOLE SODIUM 40 MG PO TBEC
40.0000 mg | DELAYED_RELEASE_TABLET | Freq: Every day | ORAL | Status: DC
  Administered 2015-02-28 – 2015-03-04 (×5): 40 mg via ORAL
  Filled 2015-02-26 (×4): qty 1

## 2015-02-26 MED FILL — Heparin Sodium (Porcine) Inj 1000 Unit/ML: INTRAMUSCULAR | Qty: 30 | Status: AC

## 2015-02-26 MED FILL — Heparin Sodium (Porcine) Inj 1000 Unit/ML: INTRAMUSCULAR | Qty: 10 | Status: AC

## 2015-02-26 MED FILL — Sodium Bicarbonate IV Soln 8.4%: INTRAVENOUS | Qty: 50 | Status: AC

## 2015-02-26 MED FILL — Sodium Chloride IV Soln 0.9%: INTRAVENOUS | Qty: 2000 | Status: AC

## 2015-02-26 MED FILL — Mannitol IV Soln 20%: INTRAVENOUS | Qty: 500 | Status: AC

## 2015-02-26 MED FILL — Electrolyte-R (PH 7.4) Solution: INTRAVENOUS | Qty: 4000 | Status: AC

## 2015-02-26 MED FILL — Lidocaine HCl IV Inj 20 MG/ML: INTRAVENOUS | Qty: 5 | Status: AC

## 2015-02-26 SURGICAL SUPPLY — 69 items
ADAPTER CARDIO PERF ANTE/RETRO (ADAPTER) ×4 IMPLANT
ADPR PRFSN 84XANTGRD RTRGD (ADAPTER) ×2
BAG DECANTER FOR FLEXI CONT (MISCELLANEOUS) ×4 IMPLANT
BLADE STERNUM SYSTEM 6 (BLADE) ×4 IMPLANT
BLADE SURG 15 STRL LF DISP TIS (BLADE) ×2 IMPLANT
BLADE SURG 15 STRL SS (BLADE) ×4
CANISTER SUCTION 2500CC (MISCELLANEOUS) ×4 IMPLANT
CANNULA GUNDRY RCSP 15FR (MISCELLANEOUS) ×4 IMPLANT
CATH ROBINSON RED A/P 18FR (CATHETERS) ×12 IMPLANT
CATH THORACIC 36FR (CATHETERS) ×4 IMPLANT
CATH THORACIC 36FR RT ANG (CATHETERS) ×4 IMPLANT
CONT SPEC STER OR (MISCELLANEOUS) ×4 IMPLANT
COVER SURGICAL LIGHT HANDLE (MISCELLANEOUS) ×4 IMPLANT
CRADLE DONUT ADULT HEAD (MISCELLANEOUS) ×4 IMPLANT
DRAPE SLUSH/WARMER DISC (DRAPES) ×4 IMPLANT
DRSG COVADERM 4X14 (GAUZE/BANDAGES/DRESSINGS) ×4 IMPLANT
ELECT CAUTERY BLADE 6.4 (BLADE) ×4 IMPLANT
ELECT REM PT RETURN 9FT ADLT (ELECTROSURGICAL) ×8
ELECTRODE REM PT RTRN 9FT ADLT (ELECTROSURGICAL) ×4 IMPLANT
GAUZE SPONGE 4X4 12PLY STRL (GAUZE/BANDAGES/DRESSINGS) ×4 IMPLANT
GLOVE BIO SURGEON STRL SZ 6 (GLOVE) IMPLANT
GLOVE BIO SURGEON STRL SZ 6.5 (GLOVE) ×2 IMPLANT
GLOVE BIO SURGEON STRL SZ7 (GLOVE) IMPLANT
GLOVE BIO SURGEON STRL SZ7.5 (GLOVE) IMPLANT
GLOVE BIO SURGEONS STRL SZ 6.5 (GLOVE) ×2
GLOVE BIOGEL PI IND STRL 6 (GLOVE) IMPLANT
GLOVE BIOGEL PI IND STRL 6.5 (GLOVE) IMPLANT
GLOVE BIOGEL PI INDICATOR 6 (GLOVE) ×8
GLOVE BIOGEL PI INDICATOR 6.5 (GLOVE) ×8
GLOVE EUDERMIC 7 POWDERFREE (GLOVE) ×10 IMPLANT
GOWN STRL REUS W/ TWL LRG LVL3 (GOWN DISPOSABLE) ×8 IMPLANT
GOWN STRL REUS W/ TWL XL LVL3 (GOWN DISPOSABLE) ×2 IMPLANT
GOWN STRL REUS W/TWL LRG LVL3 (GOWN DISPOSABLE) ×20
GOWN STRL REUS W/TWL XL LVL3 (GOWN DISPOSABLE) ×4
HEART VENT LT CURVED (MISCELLANEOUS) ×4 IMPLANT
HEMOSTAT POWDER SURGIFOAM 1G (HEMOSTASIS) ×12 IMPLANT
HEMOSTAT SURGICEL 2X14 (HEMOSTASIS) ×4 IMPLANT
KIT BASIN OR (CUSTOM PROCEDURE TRAY) ×4 IMPLANT
KIT CATH CPB BARTLE (MISCELLANEOUS) ×4 IMPLANT
KIT ROOM TURNOVER OR (KITS) ×4 IMPLANT
KIT SUCTION CATH 14FR (SUCTIONS) ×4 IMPLANT
LINE VENT (MISCELLANEOUS) ×2 IMPLANT
NS IRRIG 1000ML POUR BTL (IV SOLUTION) ×24 IMPLANT
PACK OPEN HEART (CUSTOM PROCEDURE TRAY) ×4 IMPLANT
PACK TRANSFER 300ML (TOM) (MISCELLANEOUS) ×4 IMPLANT
PAD ARMBOARD 7.5X6 YLW CONV (MISCELLANEOUS) ×8 IMPLANT
SET CARDIOPLEGIA MPS 5001102 (MISCELLANEOUS) ×2 IMPLANT
SET Y EXTENSION LINE CSP (IV SETS) ×2 IMPLANT
SPONGE GAUZE 4X4 12PLY STER LF (GAUZE/BANDAGES/DRESSINGS) ×2 IMPLANT
SUT BONE WAX W31G (SUTURE) ×4 IMPLANT
SUT ETHIBON 2 0 V 52N 30 (SUTURE) ×8 IMPLANT
SUT PROLENE 3 0 SH DA (SUTURE) IMPLANT
SUT PROLENE 3 0 SH1 36 (SUTURE) ×4 IMPLANT
SUT PROLENE 4 0 RB 1 (SUTURE) ×12
SUT PROLENE 4-0 RB1 .5 CRCL 36 (SUTURE) ×6 IMPLANT
SUT STEEL 6MS V (SUTURE) ×4 IMPLANT
SUT VIC AB 1 CTX 36 (SUTURE) ×8
SUT VIC AB 1 CTX36XBRD ANBCTR (SUTURE) ×4 IMPLANT
SUTURE E-PAK OPEN HEART (SUTURE) ×4 IMPLANT
SYSTEM SAHARA CHEST DRAIN ATS (WOUND CARE) ×4 IMPLANT
TAPE CLOTH SURG 4X10 WHT LF (GAUZE/BANDAGES/DRESSINGS) ×2 IMPLANT
TOWEL OR 17X24 6PK STRL BLUE (TOWEL DISPOSABLE) ×8 IMPLANT
TOWEL OR 17X26 10 PK STRL BLUE (TOWEL DISPOSABLE) ×8 IMPLANT
TRAY FOLEY IC TEMP SENS 16FR (CATHETERS) ×4 IMPLANT
TUBING PVC 1/4X1/16 WALL 8 (MISCELLANEOUS) ×2 IMPLANT
TUBING PVC 1/4X1/16 WALL 8' (MISCELLANEOUS) ×2
UNDERPAD 30X30 INCONTINENT (UNDERPADS AND DIAPERS) ×4 IMPLANT
VALVE MAGNA EASE 21MM (Prosthesis & Implant Heart) ×2 IMPLANT
WATER STERILE IRR 1000ML POUR (IV SOLUTION) ×8 IMPLANT

## 2015-02-26 NOTE — Progress Notes (Signed)
  Echocardiogram Echocardiogram Transesophageal has been performed.  Nicole Hess 02/26/2015, 9:26 AM

## 2015-02-26 NOTE — Progress Notes (Signed)
Attempted to wean pt at 1430, pt awake but continuously falling asleep with RR dropping 6-8bpm. Pt placed back on full support after minimal breath initiation- will try again later.

## 2015-02-26 NOTE — Transfer of Care (Signed)
Immediate Anesthesia Transfer of Care Note  Patient: Nicole Hess  Procedure(s) Performed: Procedure(s): AORTIC VALVE REPLACEMENT (AVR) (N/A) TRANSESOPHAGEAL ECHOCARDIOGRAM (TEE) (N/A)  Patient Location: PACU and SICU  Anesthesia Type:General  Level of Consciousness: Patient remains intubated per anesthesia plan  Airway & Oxygen Therapy: Patient remains intubated per anesthesia plan and Patient placed on Ventilator (see vital sign flow sheet for setting)  Post-op Assessment: Report given to RN and Post -op Vital signs reviewed and stable  Post vital signs: Reviewed and stable  Last Vitals:  Filed Vitals:   02/26/15 0554  BP: 119/61  Pulse: 61  Temp: 36.7 C  Resp: 18    Complications: No apparent anesthesia complications

## 2015-02-26 NOTE — Brief Op Note (Signed)
02/26/2015  10:36 AM  PATIENT:  Nicole Hess  69 y.o. female  PRE-OPERATIVE DIAGNOSIS:  Severe Aortic Stenosis  POST-OPERATIVE DIAGNOSIS:  Severe Aortic Stenosis  PROCEDURE:  Procedure(s): AORTIC VALVE REPLACEMENT (AVR) (N/A) TRANSESOPHAGEAL ECHOCARDIOGRAM (TEE) (N/A)  SURGEON:  Surgeon(s) and Role:    * Gaye Pollack, MD - Primary  PHYSICIAN ASSISTANT: Lars Pinks PA-C  ANESTHESIA:   general  EBL:  Total I/O In: -  Out: 750 [Urine:750]  DRAINS: Chest tubes placed in the mediastinal and pleural spaces   SPECIMEN:  Source of Specimen:  Native aortic valve leaflets  DISPOSITION OF SPECIMEN:  PATHOLOGY  COUNTS CORRECT:  YES  DICTATION: .Dragon Dictation  PLAN OF CARE: Admit to inpatient   PATIENT DISPOSITION:  ICU - intubated and hemodynamically stable.   Delay start of Pharmacological VTE agent (>24hrs) due to surgical blood loss or risk of bleeding: yes  BASELINE WEIGHT: 60 kg  Aortic Valve Etiology   Aortic Insufficiency:  Trivial/Trace  Aortic Valve Disease:  Yes.  Aortic Stenosis:  Yes. Smallest Aortic Valve Area: 0.8cm2; Highest Mean Gradient: 73mmHg.  Etiology (Choose at least one and up to  5 etiologies):  Bicuspid valve disease  Aortic Valve  Procedure Performed:  Replacement: Yes.  Bioprosthetic Valve. Implant Model Number:[id number] [id number], Size:21 mm, Unique Device Identifier:[id number]  Repair/Reconstruction: No.   Aortic Annular Enlargement: No.

## 2015-02-26 NOTE — H&P (Signed)
[address]       [place] [address]             [phone]      Cardiothoracic Surgery History and Physical   PCP is Donnajean Lopes, MD Referring Provider is Martinique, Peter M, MD  Chief Complaint  Patient presents with  . Fatigue    bicuspid valve....eval for AVR...ECHO 10/22/14, CATH 01/29/15  . Aortic Stenosis    HPI:  The patient is a 69 year old woman with a history of bicuspid aortic valve disease that has been followed by Dr. Martinique with echo. Her most recent echo on 10/22/2014 showed progression to severe aortic stenosis with a mean gradient of 40 mm Hg and a valve area of 0.85 cm2. The valve is bicuspid with thickened, calcified leaflets with severely restricted motion. LVEF is 60-65% with no MS or MR. She underwent R and L heart cath on 01/29/2015 that showed normal coronaries with a mean transvalvular aortic gradient of 29 mm Hg with a peak of 32 mm Hg. Right heart pressures were normal.   She lives alone at [facility] and walks daily on the paths around [facility] about 1 mile with no symptoms of shortness of breath, chest pain or dizziness. She does report fatigue and shortness of breath with walking up stairs or inclines. 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 She is single and has never been married.  Past Medical History  Diagnosis Date  . Seizures     as a child  . Bicuspid aortic valve   . Aortic stenosis   . Leukopenia     Seen by Dr. Lamonte Sakai, present since 2008, watchful waiting  . Gout   . Osteopenia   . Abnormal Pap smear   . Anxiety   . Heart murmur   . Heart valve problem     will have follow up Echo-Dr Jordan-will have replacement in a couple of years    Past Surgical History  Procedure Laterality Date  . Oophorectomy    . Partial hysterectomy    . Gum surgery    . Tubal ligation    . Cervical cone biopsy    . Vaginal  hysterectomy      for dysplasia  . Colonoscopy  2010  . Doppler echocardiography  3/14  . Cardiac catheterization N/A 01/29/2015    Procedure: Right/Left Heart Cath and Coronary Angiography; Surgeon: Peter M Martinique, MD; Location: [facility]; Service: Cardiovascular; Laterality: N/A;    Family History  Problem Relation Age of Onset  . Multiple myeloma Mother   . Stroke Father   . Hypertension Father   . Heart disease Father 40  . Kidney disease Father   . Cancer Father   . Cancer Maternal Grandmother     renal    Social History History  Substance Use Topics  . Smoking status: Never Smoker   . Smokeless tobacco: Never Used  . Alcohol Use: Yes     Comment: 2 glasses of wine/cranberry juice a day    Current Outpatient Prescriptions  Medication Sig Dispense Refill  . Ascorbic Acid (VITAMIN C) 1000 MG tablet Take 1,000 mg by mouth daily.    Marland Kitchen b complex vitamins tablet Take 1 tablet by mouth daily.    . Calcium Carbonate-Vitamin D (CALTRATE 600+D PO) Take 1 tablet by mouth 2 (two) times daily.    Marland Kitchen estradiol (ESTRACE) 0.5 MG tablet Take 1  tablet (0.5 mg total) by mouth daily. 90 tablet 1  . Estradiol (VAGIFEM) 10 MCG TABS vaginal tablet INSERT 1 TABLET VAGINALLY 2 TIMES A WEEK 24 tablet 4  . Glucosamine-Chondroitin (OSTEO BI-FLEX REGULAR STRENGTH PO) Take 1 tablet by mouth 2 (two) times daily.     . Multiple Vitamin (MULITIVITAMIN WITH MINERALS) TABS Take 1 tablet by mouth daily.    . Omega-3 Fatty Acids (OMEGA-3 FISH OIL PO) Take 1 capsule by mouth 2 (two) times daily.     . pravastatin (PRAVACHOL) 80 MG tablet Take 80 mg by mouth daily.    . primidone (MYSOLINE) 250 MG tablet Take 250 mg by mouth 2 (two) times daily.    . temazepam (RESTORIL) 30 MG capsule Take 30 mg by mouth at bedtime as needed. For sleep    . Tretinoin Microsphere  (RETIN-A MICRO EX) Apply 1 application topically daily.     . vitamin B-12 (CYANOCOBALAMIN) 50 MCG tablet Take 50 mcg by mouth daily.    . Echinacea 125 MG CAPS Take 125 mg by mouth daily.      No current facility-administered medications for this visit.    Allergies  Allergen Reactions  . Penicillins Rash    Review of Systems  Constitutional: Positive for fatigue. Negative for fever, activity change, appetite change and unexpected weight change.  HENT: Negative.   Sees her dentist every 6 months and has had no dental problems  Eyes: Negative.  Respiratory: Negative.  Cardiovascular: Positive for leg swelling. Negative for chest pain and palpitations.   In ankles  Gastrointestinal: Negative.  Genitourinary: Negative.  Musculoskeletal: Negative.  Skin: Negative.  Allergic/Immunologic: Negative.  Neurological: Negative for dizziness and light-headedness.  Hematological: Negative.  Psychiatric/Behavioral: Negative.   BP 119/61 mmHg  Pulse 61  Temp(Src) 98 F (36.7 C) (Oral)  Resp 18  Wt 59.875 kg (132 lb)  SpO2 100%  LMP 09/15/1987  Physical Exam  Constitutional: She is oriented to person, place, and time. She appears well-developed and well-nourished. No distress.  HENT:  Head: Normocephalic and atraumatic.  Mouth/Throat: Oropharynx is clear and moist.  Eyes: EOM are normal. Pupils are equal, round, and reactive to light.  Neck: Normal range of motion. Neck supple. No JVD present. No thyromegaly present.  Cardiovascular: Normal rate, regular rhythm and intact distal pulses.  Murmur heard. 3/6 systolic murmur along RSB  Pulmonary/Chest: Effort normal and breath sounds normal. No respiratory distress. She has no rales. She exhibits no tenderness.  Abdominal: Soft. Bowel sounds are normal. She exhibits no distension and no mass. There is no tenderness.  Musculoskeletal: Normal range of motion. She exhibits no edema.    Lymphadenopathy:   She has no cervical adenopathy.  Neurological: She is alert and oriented to person, place, and time. She has normal strength. No cranial nerve deficit or sensory deficit.  Skin: Skin is warm and dry.  Psychiatric: She has a normal mood and affect.     Diagnostic Tests:    *[facility], [place], [place] [address]              [phone]  ------------------------------------------------------------------- Transthoracic Echocardiography  Patient:  Blakely, Maranan MR #:    [id number] Study Date: 10/22/2014 Gender:   F Age:    89 Height:   177.8 cm Weight:   59 kg BSA:    1.69 m^2  Pt. Status: Room:  ATTENDING  Peter Martinique, M.D. ORDERING   Peter Martinique, M.D. REFERRING  Peter Martinique, M.D. SONOGRAPHER [name], RCS PERFORMING  [facility], Outpatient  cc:  ------------------------------------------------------------------- LV EF: 60% -  65%  ------------------------------------------------------------------- Indications:   424.1 Aortic valve disorders.  ------------------------------------------------------------------- History:  PMH: Hyperlipidemia.  ------------------------------------------------------------------- Study Conclusions  - Left ventricle: The cavity size was normal. Wall thickness was normal. Systolic function was normal. The estimated ejection fraction was in the range of 60% to 65%. - Aortic valve: Bicuspid; moderately thickened, moderately calcified leaflets. Valve mobility was severely restricted. There was severe stenosis. Valve area (VTI): 0.85 cm^2. Valve area (Vmax): 0.86 cm^2.  Transthoracic echocardiography. M-mode, complete 2D, spectral Doppler, and color Doppler. Birthdate: Patient birthdate: 1946/01/26. Age: Patient is 69 yr old. Sex: Gender: female. BMI:  18.7 kg/m^2. Blood pressure:   122/71 Patient status: Outpatient. Study date: Study date: 10/22/2014. Study time: 02:30 PM. Location: Echo laboratory.  -------------------------------------------------------------------  ------------------------------------------------------------------- Left ventricle: The cavity size was normal. Wall thickness was normal. Systolic function was normal. The estimated ejection fraction was in the range of 60% to 65%.  ------------------------------------------------------------------- Aortic valve: ONly the peak gradient was provided. the Mean gradient was not provided. THe valve appears to be severely stenotic. Bicuspid; moderately thickened, moderately calcified leaflets. Valve mobility was severely restricted. Doppler:  There was severe stenosis.   Valve area (VTI): 0.85 cm^2. Indexed valve area (VTI): 0.5 cm^2/m^2. Valve area (Vmax): 0.86 cm^2. Indexed valve area (Vmax): 0.51 cm^2/m^2.  Peak gradient (S): 73 mm Hg.  ------------------------------------------------------------------- Mitral valve:  Structurally normal valve.  Leaflet separation was normal. Doppler: Transvalvular velocity was within the normal range. There was no evidence for stenosis. There was no regurgitation.  Peak gradient (D): 3 mm Hg.  ------------------------------------------------------------------- Left atrium: The atrium was normal in size.  ------------------------------------------------------------------- Right ventricle: The cavity size was normal. Systolic function was normal.  ------------------------------------------------------------------- Pulmonic valve:  The valve appears to be grossly normal. Doppler: There was no significant regurgitation.  ------------------------------------------------------------------- Tricuspid valve:  Structurally normal valve.  Leaflet separation was normal. Doppler: Transvalvular velocity was  within the normal range. There was trivial regurgitation.  ------------------------------------------------------------------- Pulmonary artery:  Systolic pressure was within the normal range.  ------------------------------------------------------------------- Right atrium: The atrium was normal in size.  ------------------------------------------------------------------- Pericardium: The pericardium was normal in appearance.  ------------------------------------------------------------------- Systemic veins: Inferior vena cava: The vessel was normal in size. The respirophasic diameter changes were in the normal range (= 50%), consistent with normal central venous pressure. Diameter: 15 mm.  ------------------------------------------------------------------- Measurements  IVC                    Value     Reference ID                    15  mm    ---------  Left ventricle              Value     Reference Stroke volume, 2D             91  ml    --------- Stroke volume/bsa, 2D           54  ml/m^2  --------- LV e&', lateral              10.2 cm/s   --------- LV E/e&', lateral             8.18      --------- LV e&', medial  8.66 cm/s   --------- LV E/e&', medial              9.63      --------- LV e&', average              9.43 cm/s   --------- LV E/e&', average             8.84      ---------  LVOT                   Value     Reference LVOT ID, S                20  mm    --------- LVOT area                 3.14 cm^2   --------- LVOT peak velocity, S           117  cm/s   --------- LVOT mean velocity, S           78.8 cm/s   --------- LVOT VTI, S                 29.1 cm    --------- LVOT peak gradient, S           5   mm Hg  ---------  Aortic valve               Value     Reference Aortic peak gradient, S          73  mm Hg  --------- Aortic valve area, VTI          0.85 cm^2   --------- Aortic valve area/bsa, VTI        0.5  cm^2/m^2 --------- Aortic valve area, peak velocity     0.86 cm^2   --------- Aortic valve area/bsa, peak        0.51 cm^2/m^2 --------- velocity  Left atrium                Value     Reference LA volume, S               40  ml    --------- LA volume/bsa, S             23.6 ml/m^2  --------- LA volume, ES, 1-p A4C          34  ml    --------- LA volume/bsa, ES, 1-p A4C        20.1 ml/m^2  --------- LA volume, ES, 1-p A2C          47  ml    --------- LA volume/bsa, ES, 1-p A2C        27.7 ml/m^2  ---------  Mitral valve               Value     Reference Mitral E-wave peak velocity        83.4 cm/s   --------- Mitral A-wave peak velocity        64.2 cm/s   --------- Mitral deceleration time         211  ms    150 - 230 Mitral peak gradient, D          3   mm Hg  --------- Mitral E/A ratio, peak          1.3      ---------  Pulmonary arteries  Value     Reference PA pressure, S, DP            29  mm Hg  <=30  Tricuspid valve              Value     Reference Tricuspid regurg peak velocity      254  cm/s   --------- Tricuspid peak RV-RA gradient       26  mm Hg  --------- Tricuspid maximal regurg velocity,    254  cm/s   --------- PISA  Systemic veins              Value     Reference Estimated  CVP               3   mm Hg  ---------  Right ventricle              Value     Reference RV pressure, S, DP            29  mm Hg  <=30 RV s&', lateral, S             13  cm/s   ---------  Legend: (L) and (H) mark values outside specified reference range.  ------------------------------------------------------------------- Prepared and Electronically Authenticated by  Mertie Moores, M.D. 2016-02-08T17:34:05   Conclusion     Normal coronary anatomy  Moderate to severe aortic stenosis, mean gradient of 29 mm Hg  valve area 1.1 cm squared, index 0.65  Normal right heart pressures.  Normal cardiac output.  Recommendation: refer to CT surgery to consider AV replacement.      Coronary Findings    Dominance: Right   Left Main  The vessel , is normal in caliber is angiographically normal. Calification at ostium     Left Anterior Descending  The vessel , is normal in caliber is angiographically normal.     Left Circumflex  The vessel , is normal in caliber is angiographically normal.     Right Coronary Artery  The vessel , is normal in caliber is angiographically normal.       Right Heart Pressures Hemodynamic findings consistent with aortic stenosis. LV EDP is normal.    Left Heart    Aortic Valve There is severe aortic valve stenosis. The aortic valve is calcified.           Hemo Data       Most Recent Value   Fick Cardiac Output  5.78 L/min   Fick Cardiac Output Index  3.4 (L/min)/BSA   Aortic Mean Gradient  28.9 mmHg   Aortic Peak Gradient  32 mmHg   RA A Wave  11 mmHg   RA V Wave  8 mmHg   RA Mean  7 mmHg   RV Systolic Pressure  33 mmHg   RV Diastolic Pressure  2 mmHg   RV EDP  10 mmHg   PA Systolic Pressure  26 mmHg   PA Diastolic Pressure  11 mmHg   PA  Mean  18 mmHg   PW A Wave  17 mmHg   PW V Wave  21 mmHg   PW Mean  15 mmHg   AO Systolic Pressure  379 mmHg   AO Diastolic Pressure  61 mmHg   AO Mean  86 mmHg   LV Systolic Pressure  024 mmHg   LV Diastolic Pressure  0 mmHg   LV EDP  16 mmHg   Arterial Occlusion Pressure Extended Systolic  Pressure  131 mmHg   Arterial Occlusion Pressure Extended Diastolic Pressure  63 mmHg   Arterial Occlusion Pressure Extended Mean Pressure  90 mmHg   Left Ventricular Apex Extended Systolic Pressure  989 mmHg   Left Ventricular Apex Extended Diastolic Pressure  0 mmHg   Left Ventricular Apex Extended EDP Pressure  17 mmHg    Implants    Name ID Temporary Type Supply   No information to display    Hemo Data       Most Recent Value   Fick Cardiac Output  5.78 L/min   Fick Cardiac Output Index  3.4 (L/min)/BSA   Aortic Mean Gradient  28.9 mmHg   Aortic Peak Gradient  32 mmHg   RA A Wave  11 mmHg   RA V Wave  8 mmHg   RA Mean  7 mmHg   RV Systolic Pressure  33 mmHg   RV Diastolic Pressure  2 mmHg   RV EDP  10 mmHg   PA Systolic Pressure  26 mmHg   PA Diastolic Pressure  11 mmHg   PA Mean  18 mmHg   PW A Wave  17 mmHg   PW V Wave  21 mmHg   PW Mean  15 mmHg   AO Systolic Pressure  211 mmHg   AO Diastolic Pressure  61 mmHg   AO Mean  86 mmHg   LV Systolic Pressure  941 mmHg   LV Diastolic Pressure  0 mmHg   LV EDP  16 mmHg   Arterial Occlusion Pressure Extended Systolic Pressure  740 mmHg   Arterial Occlusion Pressure Extended Diastolic Pressure  63 mmHg   Arterial Occlusion Pressure Extended Mean Pressure  90 mmHg   Left Ventricular Apex Extended Systolic Pressure  814 mmHg   Left Ventricular Apex Extended Diastolic Pressure  0 mmHg   Left Ventricular  Apex Extended EDP Pressure  17 mmHg         Electronically signed by Peter M Martinique, MD on 01/29/15 at 1021 EDT                                       Impression:  She has a bicuspid aortic valve with severe, symptomatic aortic stenosis. She is very active and would like to remain so. I agree that AVR is indicated to prevent progressive LV deterioration and worsening symptoms. I discussed the alternatives of mechanical and tissue valves, their indications, benefits and risks with her. I think a tissue valve would be her best option at her age. She is in agreement. I discussed the operative procedure with the patient including alternatives, benefits and risks; including but not limited to bleeding, blood transfusion, infection, stroke, myocardial infarction, graft failure, heart block requiring a permanent pacemaker, organ dysfunction, and death. Nicole Hess understands and agrees to proceed.   Plan:  AVR using a tissue valve  Gaye Pollack, MD [facility] [phone]

## 2015-02-26 NOTE — Progress Notes (Signed)
Patient ID: Nicole Hess, female   DOB: 1946/03/17, 69 y.o.   MRN: [id number] EVENING ROUNDS NOTE :     [address]       [place] [address]             [phone]                 Day of Surgery Procedure(s) (LRB): AORTIC VALVE REPLACEMENT (AVR) (N/A) TRANSESOPHAGEAL ECHOCARDIOGRAM (TEE) (N/A)  Total Length of Stay:  LOS: 0 days  BP 94/47 mmHg  Pulse 86  Temp(Src) 99.1 F (37.3 C) (Oral)  Resp 8  Wt 132 lb (59.875 kg)  SpO2 100%  LMP 09/15/1987  .Intake/Output      06/13 0701 - 06/14 0700 06/14 0701 - 06/15 0700   I.V. (mL/kg)  2303.6 (38.5)   Blood  366   IV Piggyback  750   Total Intake(mL/kg)  3419.6 (57.1)   Urine (mL/kg/hr)  2165 (3.4)   Chest Tube  270 (0.4)   Total Output   2435   Net   +984.6          . sodium chloride    . [START ON 02/27/2015] sodium chloride    . sodium chloride 10 mL/hr at 02/26/15 1700  . dexmedetomidine Stopped (02/26/15 1430)  . lactated ringers 10 mL/hr at 02/26/15 1700  . lactated ringers 20 mL/hr at 02/26/15 1700  . nitroGLYCERIN Stopped (02/26/15 1232)  . phenylephrine (NEO-SYNEPHRINE) Adult infusion 20 mcg/min (02/26/15 1700)     Lab Results  Component Value Date   WBC 3.4* 02/26/2015   HGB 9.9* 02/26/2015   HCT 29.0* 02/26/2015   PLT 119* 02/26/2015   GLUCOSE 93 02/26/2015   CHOL 200 01/15/2015   TRIG 81 01/15/2015   HDL 84 01/15/2015   LDLCALC 100* 01/15/2015   ALT 41 02/22/2015   AST 57* 02/22/2015   NA 141 02/26/2015   K 3.5 02/26/2015   CL 102 02/26/2015   CREATININE 0.50 02/26/2015   BUN 15 02/26/2015   CO2 26 02/22/2015   TSH 1.560 01/15/2015   INR 1.50* 02/26/2015   HGBA1C 5.5 02/22/2015   Now extubated Awake and alert neuro intact  Grace Isaac MD  Beeper [phone] Office [phone] 02/26/2015 5:44 PM

## 2015-02-26 NOTE — Op Note (Signed)
CARDIOVASCULAR SURGERY OPERATIVE NOTE  02/26/2015 [name] [id number]  Surgeon:  Gaye Pollack, MD  First Assistant: Lars Pinks,  PA-C   Preoperative Diagnosis:  Severe bicuspid aortic valve stenosis   Postoperative Diagnosis:  Same   Procedure:  1. Median Sternotomy 2. Extracorporeal circulation 3.   Aortic valve replacement using a 21 mm Edwards Magna-Ease valve.  Anesthesia:  General Endotracheal   Clinical History/Surgical Indication:  The patient is a 69 year old woman with a history of bicuspid aortic valve disease that has been followed by Dr. Martinique with echo. Her most recent echo on 10/22/2014 showed progression to severe aortic stenosis with a mean gradient of 40 mm Hg and a valve area of 0.85 cm2. The valve is bicuspid with thickened, calcified leaflets with severely restricted motion. LVEF is 60-65% with no MS or MR. She underwent R and L heart cath on 01/29/2015 that showed normal coronaries with a mean transvalvular aortic gradient of 29 mm Hg with a peak of 32 mm Hg. Right heart pressures were normal.   She lives alone at [facility] and walks daily on the paths around [facility] about 1 mile with no symptoms of shortness of breath, chest pain or dizziness. She does report fatigue and shortness of breath with walking up stairs or inclines.   She has a bicuspid aortic valve with severe, symptomatic aortic stenosis. She is very active and would like to remain so. I agree that AVR is indicated to prevent progressive LV deterioration and worsening symptoms. I discussed the alternatives of mechanical and tissue valves, their indications, benefits and risks with her. I think a tissue valve would be her best option at her age. She is in agreement. I discussed the operative procedure with the patient including alternatives, benefits and risks; including but not limited to bleeding, blood transfusion, infection, stroke, myocardial infarction, graft failure,  heart block requiring a permanent pacemaker, organ dysfunction, and death. [name] understands and agrees to proceed.    Preparation:  The patient was seen in the preoperative holding area and the correct patient, correct operation were confirmed with the patient after reviewing the medical record and catheterization. The consent was signed by me. Preoperative antibiotics were given. A pulmonary arterial line and radial arterial line were placed by the anesthesia team. The patient was taken back to the operating room and positioned supine on the operating room table. After being placed under general endotracheal anesthesia by the anesthesia team a foley catheter was placed. The neck, chest, abdomen, and both legs were prepped with betadine soap and solution and draped in the usual sterile manner. A surgical time-out was taken and the correct patient and operative procedure were confirmed with the nursing and anesthesia staff.   Pre-bypass TEE:   Complete TEE assessment was performed by Dr. Laurie Panda. This showed a bicuspid aortic valve with severe stenosis. LV function was normal. No mitral regurgitation.    Post-bypass TEE:   Normal functioning prosthetic aortic valve with no perivalvular leak or regurgitation through the valve. Left ventricular function preserved. No mitral regurgitation.    Cardiopulmonary Bypass:  A median sternotomy was performed. The pericardium was opened in the midline. Right ventricular function appeared normal. The ascending aorta was of normal size and had no palpable plaque. There were no contraindications to aortic cannulation or cross-clamping. The patient was fully systemically heparinized and the ACT was maintained > 400 sec. The proximal aortic arch was cannulated with a 20 F aortic  cannula for arterial inflow. Venous cannulation was performed via the right atrial appendage using a two-staged venous cannula. An antegrade cardioplegia/vent cannula  was inserted into the mid-ascending aorta. A left ventricular vent was placed via the right superior pulmonary vein. A retrograde cardioplegia cannnula was placed into the coronary sinus via the right atrium. Aortic occlusion was performed with a single cross-clamp. Systemic cooling to 32 degrees Centigrade and topical cooling of the heart with iced saline were used. Hyperkalemic antegrade cold blood cardioplegia was used to induce diastolic arrest and then cold blood retrograde cardioplegia was given at about 20 minute intervals throughout the period of arrest to maintain myocardial temperature at or below 10 degrees centigrade. A temperature probe was inserted into the interventricular septum and an insulating pad was placed in the pericardium. Carbon dioxide was insufflated into the pericardium at 5L/min throughout the procedure to minimize intracardiac air.   Aortic Valve Replacement:   A transverse aortotomy was performed 1 cm above the take-off of the right coronary artery. The native valve was a true bicuspid valve with 2 leaflets and 2 commissures. The anterior leaflet was calcified and non-mobile and the posterior leaflet was mildly calcified but had good movement. The ostia of the coronary arteries were at 180 degrees from each other and were not obstructed. The native valve leaflets were excised and the annulus was decalcified with rongeurs. Care was taken to remove all particulate debris. The left ventricle was directly inspected for debris and then irrigated with ice saline solution. The annulus was sized and a size 21 mm  Edwards Magna-Ease valve was chosen. The model number was [id number] and the serial number was [id number]. While the valve was being prepared 2-0 Ethibond pledgeted horizontal mattress sutures were placed around the annulus with the pledgets in a sub-annular position. The sutures were placed through the sewing ring and the valve lowered into place. The sutures were tied sequentially.  The valve seated nicely and the coronary ostia were not obstructed. The prosthetic valve leaflets moved normally and there was no sub-valvular obstruction. The aortotomy was closed using 4-0 Prolene suture in 2 layers with felt strips to reinforce the closure.  Completion:  The patient was rewarmed to 37 degrees Centigrade. De-airing maneuvers were performed and the head placed in trendelenburg position. The crossclamp was removed with a time of 74 minutes. There was spontaneous return of sinus rhythm. The aortotomy was checked for hemostasis. Two temporary epicardial pacing wires were placed on the right atrium and two on the right ventricle. The left ventricular vent and retrograde cardioplegia cannulas were removed. The patient was weaned from CPB without difficulty on no inotropes. CPB time was 97 minutes. Cardiac output was 6 LPM. Heparin was fully reversed with protamine and the aortic and venous cannulas removed. Hemostasis was achieved. Mediastinal drainage tubes were placed. The sternum was closed with  #6 stainless steel wires. The fascia was closed with continuous # 1 vicryl suture. The subcutaneous tissue was closed with 2-0 vicryl continuous suture. The skin was closed with 3-0 vicryl subcuticular suture. All sponge, needle, and instrument counts were reported correct at the end of the case. Dry sterile dressings were placed over the incisions and around the chest tubes which were connected to pleurevac suction. The patient was then transported to the surgical intensive care unit in critical but stable condition.

## 2015-02-26 NOTE — Procedures (Signed)
Extubation Procedure Note  Patient Details:   Name: Nicole Hess DOB: [birth year] MRN: [id number]   Airway Documentation:     Evaluation  O2 sats: stable throughout Complications: No apparent complications Patient did tolerate procedure well. Bilateral Breath Sounds: Clear   Yes  Patient tolerated rapid wean. NIF -21 and VC 1.5 L. Positive for cuff leak. Patient extubated to a 2 Lpm nasal cannula. No signs of dyspnea or stridor. Patient instructed on the Incentive Spirometer achieving 1065mL, ten times, great patient effort. Patient resting comfortably. RN at bedside.   Myrtie Neither 02/26/2015, 5:24 PM

## 2015-02-26 NOTE — Anesthesia Preprocedure Evaluation (Addendum)
Anesthesia Evaluation  Patient identified by MRN, date of birth, ID band Patient awake    Reviewed: Allergy & Precautions, NPO status , Patient's Chart, lab work & pertinent test results  History of Anesthesia Complications Negative for: history of anesthetic complications  Airway Mallampati: II  TM Distance: >3 FB Neck ROM: Full    Dental no notable dental hx. (+) Teeth Intact   Pulmonary neg shortness of breath, neg COPD breath sounds clear to auscultation  Pulmonary exam normal       Cardiovascular - CAD and - CHF negative cardio ROS Normal cardiovascular exam- dysrhythmias + Valvular Problems/Murmurs AS Rhythm:Regular Rate:Normal     Neuro/Psych Seizures -, Well Controlled,  PSYCHIATRIC DISORDERS Anxiety Depression negative psych ROS   GI/Hepatic negative GI ROS, Neg liver ROS,   Endo/Other  negative endocrine ROS  Renal/GU negative Renal ROS     Musculoskeletal negative musculoskeletal ROS (+)   Abdominal   Peds  (+) Congenital Heart Disease Hematology negative hematology ROS (+)   Anesthesia Other Findings   Reproductive/Obstetrics negative OB ROS                           Anesthesia Physical Anesthesia Plan  ASA: IV  Anesthesia Plan: General   Post-op Pain Management:    Induction: Intravenous  Airway Management Planned: Oral ETT  Additional Equipment: Arterial line, CVP, PA Cath, Ultrasound Guidance Line Placement and TEE  Intra-op Plan:   Post-operative Plan: Post-operative intubation/ventilation  Informed Consent: I have reviewed the patients History and Physical, chart, labs and discussed the procedure including the risks, benefits and alternatives for the proposed anesthesia with the patient or authorized representative who has indicated his/her understanding and acceptance.   Dental advisory given  Plan Discussed with: CRNA, Anesthesiologist and  Surgeon  Anesthesia Plan Comments:        Anesthesia Quick Evaluation

## 2015-02-26 NOTE — Interval H&P Note (Signed)
History and Physical Interval Note:  02/26/2015 6:55 AM  Nicole Hess  has presented today for surgery, with the diagnosis of SEVERE AS  The various methods of treatment have been discussed with the patient and family. After consideration of risks, benefits and other options for treatment, the patient has consented to  Procedure(s): AORTIC VALVE REPLACEMENT (AVR) (N/A) TRANSESOPHAGEAL ECHOCARDIOGRAM (TEE) (N/A) as a surgical intervention .  The patient's history has been reviewed, patient examined, no change in status, stable for surgery.  I have reviewed the patient's chart and labs.  Questions were answered to the patient's satisfaction.     Gaye Pollack

## 2015-02-26 NOTE — Anesthesia Procedure Notes (Addendum)
Procedure Name: Intubation Date/Time: 02/26/2015 8:03 AM Performed by: Ollen Bowl Pre-anesthesia Checklist: Patient identified, Emergency Drugs available, Suction available, Patient being monitored and Timeout performed Patient Re-evaluated:Patient Re-evaluated prior to inductionOxygen Delivery Method: Circle system utilized and Simple face mask Preoxygenation: Pre-oxygenation with 100% oxygen Intubation Type: IV induction Ventilation: Mask ventilation without difficulty Laryngoscope Size: Miller and 2 Grade View: Grade II Tube type: Oral Tube size: 7.5 mm Number of attempts: 1 Airway Equipment and Method: Patient positioned with wedge pillow and Stylet Placement Confirmation: ETT inserted through vocal cords under direct vision,  positive ETCO2 and breath sounds checked- equal and bilateral Secured at: 21 cm Tube secured with: Tape Dental Injury: Teeth and Oropharynx as per pre-operative assessment

## 2015-02-27 ENCOUNTER — Encounter (HOSPITAL_COMMUNITY): Payer: Self-pay | Admitting: Surgery

## 2015-02-27 ENCOUNTER — Inpatient Hospital Stay (HOSPITAL_COMMUNITY): Payer: PPO

## 2015-02-27 LAB — CBC
HCT: 28.3 % — ABNORMAL LOW (ref 36.0–46.0)
HEMATOCRIT: 30.1 % — AB (ref 36.0–46.0)
Hemoglobin: 10.2 g/dL — ABNORMAL LOW (ref 12.0–15.0)
Hemoglobin: 9.4 g/dL — ABNORMAL LOW (ref 12.0–15.0)
MCH: 31.8 pg (ref 26.0–34.0)
MCH: 31.3 pg (ref 26.0–34.0)
MCHC: 33.9 g/dL (ref 30.0–36.0)
MCHC: 33.2 g/dL (ref 30.0–36.0)
MCV: 93.8 fL (ref 78.0–100.0)
MCV: 94.3 fL (ref 78.0–100.0)
PLATELETS: 148 10*3/uL — AB (ref 150–400)
PLATELETS: 104 10*3/uL — AB (ref 150–400)
RBC: 3.21 MIL/uL — ABNORMAL LOW (ref 3.87–5.11)
RBC: 3 MIL/uL — AB (ref 3.87–5.11)
RDW: 13.2 % (ref 11.5–15.5)
RDW: 13.5 % (ref 11.5–15.5)
WBC: 11.2 10*3/uL — ABNORMAL HIGH (ref 4.0–10.5)
WBC: 9.6 10*3/uL (ref 4.0–10.5)

## 2015-02-27 LAB — BASIC METABOLIC PANEL
Anion gap: 6 (ref 5–15)
BUN: 12 mg/dL (ref 6–20)
CO2: 26 mmol/L (ref 22–32)
CREATININE: 0.63 mg/dL (ref 0.44–1.00)
Calcium: 8 mg/dL — ABNORMAL LOW (ref 8.9–10.3)
Chloride: 107 mmol/L (ref 101–111)
GFR calc Af Amer: >60 mL/min (ref >60)
GFR calc non Af Amer: >60 mL/min (ref >60)
GLUCOSE: 143 mg/dL — AB (ref 65–99)
Potassium: 3.8 mmol/L (ref 3.5–5.1)
Sodium: 139 mmol/L (ref 135–145)

## 2015-02-27 LAB — POCT I-STAT, CHEM 8
BUN: 13 mg/dL (ref 6–20)
CALCIUM ION: 1.21 mmol/L (ref 1.13–1.30)
Chloride: 101 mmol/L (ref 101–111)
Creatinine, Ser: 0.6 mg/dL (ref 0.44–1.00)
Glucose, Bld: 147 mg/dL — ABNORMAL HIGH (ref 65–99)
HCT: 29 % — ABNORMAL LOW (ref 36.0–46.0)
HEMOGLOBIN: 9.9 g/dL — AB (ref 12.0–15.0)
Potassium: 3.8 mmol/L (ref 3.5–5.1)
Sodium: 135 mmol/L (ref 135–145)
TCO2: 23 mmol/L (ref 0–100)

## 2015-02-27 LAB — TYPE AND SCREEN
ABO/RH(D): O POS
Antibody Screen: NEGATIVE

## 2015-02-27 LAB — PREPARE PLATELET PHERESIS: UNIT DIVISION: 0

## 2015-02-27 LAB — CREATININE, SERUM
CREATININE: 0.6 mg/dL (ref 0.44–1.00)
GFR calc Af Amer: >60 mL/min (ref >60)
GFR calc non Af Amer: >60 mL/min (ref >60)

## 2015-02-27 LAB — GLUCOSE, CAPILLARY
GLUCOSE-CAPILLARY: 127 mg/dL — AB (ref 65–99)
Glucose-Capillary: 132 mg/dL — ABNORMAL HIGH (ref 65–99)
Glucose-Capillary: 144 mg/dL — ABNORMAL HIGH (ref 65–99)
Glucose-Capillary: 173 mg/dL — ABNORMAL HIGH (ref 65–99)
Glucose-Capillary: 162 mg/dL — ABNORMAL HIGH (ref 65–99)

## 2015-02-27 LAB — MAGNESIUM
MAGNESIUM: 2.4 mg/dL (ref 1.7–2.4)
MAGNESIUM: 2.4 mg/dL (ref 1.7–2.4)

## 2015-02-27 MED ORDER — INSULIN ASPART 100 UNIT/ML ~~LOC~~ SOLN
0.0000 [IU] | SUBCUTANEOUS | Status: DC
  Administered 2015-02-27 (×2): 4 [IU] via SUBCUTANEOUS
  Administered 2015-02-27 – 2015-02-28 (×2): 2 [IU] via SUBCUTANEOUS

## 2015-02-27 MED ORDER — ENOXAPARIN SODIUM 40 MG/0.4ML ~~LOC~~ SOLN
40.0000 mg | Freq: Every day | SUBCUTANEOUS | Status: DC
  Administered 2015-02-27 – 2015-03-03 (×5): 40 mg via SUBCUTANEOUS
  Filled 2015-02-27 (×6): qty 0.4

## 2015-02-27 NOTE — Anesthesia Postprocedure Evaluation (Signed)
  Anesthesia Post-op Note  Patient: Nicole Hess  Procedure(s) Performed: Procedure(s): AORTIC VALVE REPLACEMENT (AVR) (N/A) TRANSESOPHAGEAL ECHOCARDIOGRAM (TEE) (N/A)  Patient Location: ICU  Anesthesia Type:General  Level of Consciousness: sedated  Airway and Oxygen Therapy: Patient remains intubated per anesthesia plan  Post-op Pain: none  Post-op Assessment: Post-op Vital signs reviewed, Patient's Cardiovascular Status Stable, Respiratory Function Stable, Patent Airway, No signs of Nausea or vomiting and Pain level controlled              Post-op Vital Signs: Reviewed and stable  Last Vitals:  Filed Vitals:   02/27/15 1400  BP: 114/43  Pulse: 64  Temp:   Resp: 16    Complications: No apparent anesthesia complications

## 2015-02-27 NOTE — Progress Notes (Signed)
Patient ID: Nicole Hess, female   DOB: 07-Oct-1945, 69 y.o.   MRN: [id number]  SICU Evening Rounds:  Hemodynamically stable off neo.  Ambulated some  Urine output ok. Wait until tomorrow to diurese  BMET    Component Value Date/Time   NA 135 02/27/2015 1706   K 3.8 02/27/2015 1706   CL 101 02/27/2015 1706   CO2 26 02/27/2015 0426   GLUCOSE 147* 02/27/2015 1706   BUN 13 02/27/2015 1706   CREATININE 0.60 02/27/2015 1706   CREATININE 0.78 01/22/2015 1213   CALCIUM 8.0* 02/27/2015 0426   GFRNONAA >60 02/27/2015 1700   GFRAA >60 02/27/2015 1700    CBC    Component Value Date/Time   WBC 9.6 02/27/2015 1700   WBC 3.7* 03/30/2012 0817   RBC 3.00* 02/27/2015 1700   RBC 4.01 03/30/2012 0817   HGB 9.9* 02/27/2015 1706   HGB 12.7 03/30/2012 0817   HCT 29.0* 02/27/2015 1706   HCT 38.0 03/30/2012 0817   PLT 104* 02/27/2015 1700   PLT 165 03/30/2012 0817   MCV 94.3 02/27/2015 1700   MCV 94.8 03/30/2012 0817   MCH 31.3 02/27/2015 1700   MCH 31.7 03/30/2012 0817   MCHC 33.2 02/27/2015 1700   MCHC 33.5 03/30/2012 0817   RDW 13.5 02/27/2015 1700   RDW 13.0 03/30/2012 0817   LYMPHSABS 1.3 01/22/2015 1154   LYMPHSABS 1.6 03/30/2012 0817   MONOABS 0.3 01/22/2015 1154   MONOABS 0.3 03/30/2012 0817   EOSABS 0.0 01/22/2015 1154   EOSABS 0.1 03/30/2012 0817   BASOSABS 0.0 01/22/2015 1154   BASOSABS 0.0 03/30/2012 0817    A/P: stable, continue current plans.

## 2015-02-27 NOTE — Care Management Note (Signed)
Case Management Note  Patient Details  Name: ZNIYA COTTONE MRN: [id number] Date of Birth: [birth year]  Subjective/Objective:       Patient lives at home alone in one bedroom apt.  Has noone to be with her post op at home.  Would like to go to rehab.  PT/OT orders placed.  SW consult made, informed patient we will have to see if insurance will approve her for rehab at [facility].  Does not want [facility], interested in [facility] or [facility].                Action/Plan:   Expected Discharge Date:                  Expected Discharge Plan:  Skilled Nursing Facility  In-House Referral:  Clinical Social Work  Discharge planning Services     Post Acute Care Choice:    Choice offered to:     DME Arranged:    DME Agency:     HH Arranged:    [place] Agency:     Status of Service:  In process, will continue to follow  Medicare Important Message Given:    Date Medicare IM Given:    Medicare IM give by:    Date Additional Medicare IM Given:    Additional Medicare Important Message give by:     If discussed at [place] of Stay Meetings, dates discussed:    Additional Comments:  Vergie Living, RN 02/27/2015, 2:05 PM

## 2015-02-27 NOTE — Clinical Social Work Note (Signed)
CSW received consult for SNF placement, CSW did not have time to see patient, FL2 on chart waiting for signature of physician.  CSW to complete assessment at a later time.  Jones Broom. [place], [name], [place] 02/27/2015 5:04 PM

## 2015-02-27 NOTE — Progress Notes (Signed)
1 Day Post-Op Procedure(s) (LRB): AORTIC VALVE REPLACEMENT (AVR) (N/A) TRANSESOPHAGEAL ECHOCARDIOGRAM (TEE) (N/A) Subjective:  No complaints  Objective: Vital signs in last 24 hours: Temp:  [97 F (36.1 C)-99.5 F (37.5 C)] 98.8 F (37.1 C) (06/15 0715) Pulse Rate:  [79-87] 87 (06/15 0715) Cardiac Rhythm:  [-] Atrial paced (06/15 0400) Resp:  [6-23] 12 (06/15 0715) BP: (81-122)/(42-68) 105/62 mmHg (06/15 0700) SpO2:  [100 %] 100 % (06/15 0715) Arterial Line BP: (83-135)/(50-84) 114/61 mmHg (06/15 0715) FiO2 (%):  [40 %-50 %] 40 % (06/14 1642) Weight:  [65 kg (143 lb 4.8 oz)] 65 kg (143 lb 4.8 oz) (06/15 0545)  Hemodynamic parameters for last 24 hours: PAP: (19-33)/(8-17) 21/12 mmHg CO:  [3.1 L/min-4.2 L/min] 4.1 L/min CI:  [1.8 L/min/m2-2.5 L/min/m2] 2.4 L/min/m2  Intake/Output from previous day: 06/14 0701 - 06/15 0700 In: 4744.3 [I.V.:3078.3; Blood:366; IV Piggyback:1300] Out: 3729 [Urine:2820; Chest Tube:460] Intake/Output this shift:    General appearance: alert and cooperative Neurologic: intact Heart: regular rate and rhythm, S1, S2 normal, no murmur, click, rub or gallop Lungs: clear to auscultation bilaterally Extremities: edema mild Wound: dressing dry  Lab Results:  Recent Labs  02/26/15 1825 02/27/15 0426  WBC 11.5* 11.2*  HGB 10.0* 10.2*  HCT 29.2* 30.1*  PLT 146* 148*   BMET:  Recent Labs  02/26/15 1820 02/26/15 1825 02/27/15 0426  NA 139  --  139  K 3.9  --  3.8  CL 112*  --  107  CO2  --   --  26  GLUCOSE 137*  --  143*  BUN 14  --  12  CREATININE 0.70 0.72 0.63  CALCIUM  --   --  8.0*    PT/INR:  Recent Labs  02/26/15 1214  LABPROT 18.2*  INR 1.50*   ABG    Component Value Date/Time   PHART 7.359 02/26/2015 2203   HCO3 25.8* 02/26/2015 2203   TCO2 27 02/26/2015 2203   O2SAT 100.0 02/26/2015 2203   CBG (last 3)   Recent Labs  02/26/15 1910 02/26/15 2313 02/27/15 0403  GLUCAP 139* 121* 127*   CXR  ok  Assessment/Plan: S/P Procedure(s) (LRB): AORTIC VALVE REPLACEMENT (AVR) (N/A) TRANSESOPHAGEAL ECHOCARDIOGRAM (TEE) (N/A)  Hemodynamically stable on low dose neo. Wean as tolerated. Mobilize Diuresis once off neo d/c tubes/lines Continue foley due to patient in ICU and urinary output monitoring See progression orders   LOS: 1 day    Nicole Hess 02/27/2015

## 2015-02-28 ENCOUNTER — Inpatient Hospital Stay (HOSPITAL_COMMUNITY): Payer: PPO

## 2015-02-28 LAB — BLOOD PRODUCT ORDER (VERBAL) VERIFICATION

## 2015-02-28 LAB — CBC
HCT: 27.2 % — ABNORMAL LOW (ref 36.0–46.0)
HEMOGLOBIN: 9.2 g/dL — AB (ref 12.0–15.0)
MCH: 32.1 pg (ref 26.0–34.0)
MCHC: 33.8 g/dL (ref 30.0–36.0)
MCV: 94.8 fL (ref 78.0–100.0)
PLATELETS: 113 10*3/uL — AB (ref 150–400)
RBC: 2.87 MIL/uL — ABNORMAL LOW (ref 3.87–5.11)
RDW: 13.3 % (ref 11.5–15.5)
WBC: 11.5 10*3/uL — ABNORMAL HIGH (ref 4.0–10.5)

## 2015-02-28 LAB — GLUCOSE, CAPILLARY
GLUCOSE-CAPILLARY: 137 mg/dL — AB (ref 65–99)
GLUCOSE-CAPILLARY: 99 mg/dL (ref 65–99)

## 2015-02-28 LAB — BASIC METABOLIC PANEL
Anion gap: 7 (ref 5–15)
BUN: 13 mg/dL (ref 6–20)
CALCIUM: 8 mg/dL — AB (ref 8.9–10.3)
CHLORIDE: 100 mmol/L — AB (ref 101–111)
CO2: 26 mmol/L (ref 22–32)
CREATININE: 0.51 mg/dL (ref 0.44–1.00)
GFR calc Af Amer: >60 mL/min (ref >60)
GFR calc non Af Amer: >60 mL/min (ref >60)
Glucose, Bld: 138 mg/dL — ABNORMAL HIGH (ref 65–99)
Potassium: 3.9 mmol/L (ref 3.5–5.1)
Sodium: 133 mmol/L — ABNORMAL LOW (ref 135–145)

## 2015-02-28 MED ORDER — MIDODRINE HCL 5 MG PO TABS
5.0000 mg | ORAL_TABLET | Freq: Three times a day (TID) | ORAL | Status: DC
  Administered 2015-02-28: 5 mg via ORAL
  Filled 2015-02-28 (×5): qty 1

## 2015-02-28 MED FILL — Potassium Chloride Inj 2 mEq/ML: INTRAVENOUS | Qty: 40 | Status: AC

## 2015-02-28 MED FILL — Heparin Sodium (Porcine) Inj 1000 Unit/ML: INTRAMUSCULAR | Qty: 30 | Status: AC

## 2015-02-28 MED FILL — Magnesium Sulfate Inj 50%: INTRAMUSCULAR | Qty: 10 | Status: AC

## 2015-02-28 NOTE — Progress Notes (Signed)
2 Days Post-Op Procedure(s) (LRB): AORTIC VALVE REPLACEMENT (AVR) (N/A) TRANSESOPHAGEAL ECHOCARDIOGRAM (TEE) (N/A) Subjective: No complaints  Put back on neo overnight for mean BP in the 50's. Now on 50 mcg this am.  Objective: Vital signs in last 24 hours: Temp:  [97.9 F (36.6 C)-99 F (37.2 C)] 98.3 F (36.8 C) (06/16 0302) Pulse Rate:  [26-140] 40 (06/16 0700) Cardiac Rhythm:  [-] Atrial paced (06/15 2000) Resp:  [9-32] 18 (06/16 0700) BP: (79-152)/(39-89) 101/79 mmHg (06/16 0700) SpO2:  [76 %-100 %] 100 % (06/16 0700) Arterial Line BP: (66-123)/(52-80) 122/60 mmHg (06/15 1015) Weight:  [67.6 kg (149 lb 0.5 oz)] 67.6 kg (149 lb 0.5 oz) (06/16 0500)  Hemodynamic parameters for last 24 hours: PAP: (20-22)/(10-12) 20/10 mmHg CO:  [4.3 L/min] 4.3 L/min CI:  [2.5 L/min/m2] 2.5 L/min/m2  Intake/Output from previous day: 06/15 0701 - 06/16 0700 In: 1048.8 [I.V.:898.8; IV Piggyback:150] Out: 495 [Urine:465; Chest Tube:30] Intake/Output this shift:    General appearance: alert and cooperative Neurologic: intact Heart: regular rate and rhythm, S1, S2 normal, no murmur, click, rub or gallop Lungs: clear to auscultation bilaterally Extremities: extremities normal, atraumatic, no cyanosis or edema Wound: dressing dry  Lab Results:  Recent Labs  02/27/15 1700 02/27/15 1706 02/28/15 0400  WBC 9.6  --  11.5*  HGB 9.4* 9.9* 9.2*  HCT 28.3* 29.0* 27.2*  PLT 104*  --  113*   BMET:  Recent Labs  02/27/15 0426  02/27/15 1706 02/28/15 0400  NA 139  --  135 133*  K 3.8  --  3.8 3.9  CL 107  --  101 100*  CO2 26  --   --  26  GLUCOSE 143*  --  147* 138*  BUN 12  --  13 13  CREATININE 0.63  < > 0.60 0.51  CALCIUM 8.0*  --   --  8.0*  < > = values in this interval not displayed.  PT/INR:  Recent Labs  02/26/15 1214  LABPROT 18.2*  INR 1.50*   ABG    Component Value Date/Time   PHART 7.359 02/26/2015 2203   HCO3 25.8* 02/26/2015 2203   TCO2 23 02/27/2015 1706    O2SAT 100.0 02/26/2015 2203   CBG (last 3)   Recent Labs  02/27/15 1911 02/28/15 02/28/15 0307  GLUCAP 162* 99 137*   CXR: clear Assessment/Plan: S/P Procedure(s) (LRB): AORTIC VALVE REPLACEMENT (AVR) (N/A) TRANSESOPHAGEAL ECHOCARDIOGRAM (TEE) (N/A)  She is still requiring some neo. Wean as tolerated. Mobilize   LOS: 2 days    Gaye Pollack 02/28/2015

## 2015-02-28 NOTE — Progress Notes (Signed)
      [address]       [place] [address]             [phone]      POD # 2  No complaints  BP 102/49 mmHg  Pulse 72  Temp(Src) 99 F (37.2 C) (Oral)  Resp 16  Ht 5\' 8"  (1.727 m)  Wt 149 lb 0.5 oz (67.6 kg)  BMI 22.67 kg/m2  SpO2 97%  LMP 09/15/1987   Intake/Output Summary (Last 24 hours) at 02/28/15 1914 Last data filed at 02/28/15 1800  Gross per 24 hour  Intake 1129.51 ml  Output   1205 ml  Net -75.49 ml   BP still soft, back on neo  Remo Lipps C. Roxan Hockey, MD [facility] [phone]

## 2015-02-28 NOTE — Evaluation (Signed)
Physical Therapy Evaluation Patient Details Name: Nicole Hess MRN: [id number] DOB: 1946/02/06 Today's Date: 02/28/2015   History of Present Illness  Patient is a 69 yo female admitted 02/26/15 with AS, now s/p AVR on 02/26/15.   PMH:  Gout, osteopenia, anxiety  Clinical Impression  Patient presents with problems listed below.  Will benefit from acute PT to maximize functional mobility prior to discharge.  Patient lives alone - does not have 24 hour assist.  Recommend SNF at discharge.    Follow Up Recommendations SNF;Supervision/Assistance - 24 hour    Equipment Recommendations  Rolling walker with 5" wheels    Recommendations for Other Services       Precautions / Restrictions Precautions Precautions: Sternal;Fall Restrictions Weight Bearing Restrictions: Yes Other Position/Activity Restrictions: Sternal precautions      Mobility  Bed Mobility Overal bed mobility: Needs Assistance Bed Mobility: Rolling;Sidelying to Sit Rolling: Min guard Sidelying to sit: Min assist       General bed mobility comments: Verbal cues for technique to maintain precautions.  Patient using pillow during rolling and transition to sitting.  Assist to raise trunk to sitting and steady.  Transfers Overall transfer level: Needs assistance Equipment used: Rolling walker (2 wheeled) Transfers: Sit to/from Stand Sit to Stand: Min assist;+2 safety/equipment         General transfer comment: Verbal cues for hand placement and technique.   Min assist to rise to standing and steady from bed and toilet.  Ambulation/Gait Ambulation/Gait assistance: Min assist;+2 safety/equipment Ambulation Distance (Feet): 200 Feet Assistive device: Rolling walker (2 wheeled) Gait Pattern/deviations: Step-through pattern;Decreased stride length;Drifts right/left     General Gait Details: Verbal cues for safe use of RW.  Patient with minimal weight on UE's - using RW for balance only.  Cues to look forward and  watch for obstacles during gait.  Patient drifting to right side, hitting obstacles.  Easily distracted.  Good balance with gait.  Stairs            Wheelchair Mobility    Modified Rankin (Stroke Patients Only)       Balance                                             Pertinent Vitals/Pain Pain Assessment: No/denies pain    Home Living Family/patient expects to be discharged to:: Skilled nursing facility Living Arrangements: Alone             Home Equipment: Kasandra Knudsen - single point      Prior Function Level of Independence: Independent         Comments: Active, walks a mile/day, just retired 02/15/15     Hand Dominance        Extremity/Trunk Assessment   Upper Extremity Assessment: Overall WFL for tasks assessed           Lower Extremity Assessment: Generalized weakness         Communication   Communication: No difficulties  Cognition Arousal/Alertness: Awake/alert Behavior During Therapy: WFL for tasks assessed/performed Overall Cognitive Status: Impaired/Different from baseline Area of Impairment: Orientation;Attention;Memory;Safety/judgement;Problem solving Orientation Level: Disoriented to;Situation Current Attention Level: Sustained Memory: Decreased short-term memory   Safety/Judgement: Decreased awareness of safety   Problem Solving: Slow processing;Decreased initiation;Requires verbal cues General Comments: Required repeated cuing to stay on task.  Easily distracted.    General Comments  Exercises        Assessment/Plan    PT Assessment Patient needs continued PT services  PT Diagnosis Difficulty walking;Generalized weakness;Altered mental status   PT Problem List Decreased strength;Decreased balance;Decreased mobility;Decreased cognition;Decreased knowledge of use of DME;Decreased safety awareness;Decreased knowledge of precautions  PT Treatment Interventions DME instruction;Gait training;Functional  mobility training;Therapeutic activities;Cognitive remediation;Patient/family education   PT Goals (Current goals can be found in the Care Plan section) Acute Rehab PT Goals Patient Stated Goal: To enjoy retirement PT Goal Formulation: With patient Time For Goal Achievement: 03/07/15 Potential to Achieve Goals: Good    Frequency Min 3X/week   Barriers to discharge Decreased caregiver support Patient lives alone    Co-evaluation               End of Session Equipment Utilized During Treatment: Gait belt Activity Tolerance: Patient tolerated treatment well Patient left: in chair;with call bell/phone within reach;with chair alarm set Nurse Communication: Mobility status         Time: 1341-1404 PT Time Calculation (min) (ACUTE ONLY): 23 min   Charges:   PT Evaluation $Initial PT Evaluation Tier I: 1 Procedure PT Treatments $Gait Training: 8-22 mins   PT G CodesDespina Pole 18-Mar-2015, 7:22 PM Carita Pian. Sanjuana Kava, [place] Pager [phone]

## 2015-02-28 NOTE — Plan of Care (Signed)
Problem: Phase II - Intermediate Post-Op Goal: Maintain Hemodynamic Stability Pt restarted on neo for hypotension Goal: Advance Diet Outcome: Progressing Pt remains on CL diet due to nausea Goal: Activity Progressed Outcome: Completed/Met Date Met:  02/28/15 Pt ambulates in hall

## 2015-03-01 MED ORDER — MIDODRINE HCL 5 MG PO TABS
10.0000 mg | ORAL_TABLET | Freq: Three times a day (TID) | ORAL | Status: DC
  Administered 2015-03-01 – 2015-03-04 (×11): 10 mg via ORAL
  Filled 2015-03-01 (×13): qty 2

## 2015-03-01 NOTE — Progress Notes (Signed)
Utilization review completed.  

## 2015-03-01 NOTE — Progress Notes (Signed)
CT surgery p.m. Rounds  Patient examined and record reviewed.Hemodynamics stable,labs satisfactory.Patient had stable day.Continue current care. Peter Van Trigt III 03/01/2015   

## 2015-03-01 NOTE — Progress Notes (Signed)
CSW (Clinical Education officer, museum) aware of change in PT recommendation. CSW spoke with pt who confirmed she will have intermittent supervision and is happy to dc home with home health services. Pt did have questions about equipment. CSW advised pt to speak with PT or RNCM about this. Pt thanked CSW for new information. Pt has no further hospital social work needs. CSW signing off.  [place], [place]

## 2015-03-01 NOTE — Clinical Social Work Placement (Signed)
   CLINICAL SOCIAL WORK PLACEMENT  NOTE  Date:  03/01/2015  Patient Details  Name: Nicole Hess MRN: [id number] Date of Birth: 12-17-1945  Clinical Social Work is seeking post-discharge placement for this patient at the [place] level of care (*CSW will initial, date and re-position this form in  chart as items are completed):  Yes   Patient/family provided with [place] Work Department's list of facilities offering this level of care within the geographic area requested by the patient (or if unable, by the patient's family).  Yes   Patient/family informed of their freedom to choose among providers that offer the needed level of care, that participate in Medicare, Medicaid or managed care program needed by the patient, have an available bed and are willing to accept the patient.  Yes   Patient/family informed of [facility]'s ownership interest in [facility] and [facility], as well as of the fact that they are under no obligation to receive care at these facilities.  PASRR submitted to EDS on 03/01/15     PASRR number received on 03/01/15     Existing PASRR number confirmed on       FL2 transmitted to all facilities in geographic area requested by pt/family on 03/01/15     FL2 transmitted to all facilities within larger geographic area on       Patient informed that his/her managed care company has contracts with or will negotiate with certain facilities, including the following:            Patient/family informed of bed offers received.  Patient chooses bed at       Physician recommends and patient chooses bed at      Patient to be transferred to   on  .  Patient to be transferred to facility by       Patient family notified on   of transfer.  Name of family member notified:        PHYSICIAN Please sign FL2     Additional Comment:    _______________________________________________ Cranford Mon, LCSW 03/01/2015,  4:25 PM

## 2015-03-01 NOTE — Clinical Social Work Note (Addendum)
Clinical Social Work Assessment  Patient Details  Name: Nicole Hess MRN: [id number] Date of Birth: May 21, 1946  Date of referral:  03/01/15               Reason for consult:  Facility Placement                Permission sought to share information with:  Chartered certified accountant granted to share information::  Yes, Verbal Permission Granted  Name::        Agency::  [facility]  Relationship::     Contact Information:     Housing/Transportation Living arrangements for the past 2 months:  Single Family Home Source of Information:  Patient Patient Interpreter Needed:  None Criminal Activity/Legal Involvement Pertinent to Current Situation/Hospitalization:  No - Comment as needed Significant Relationships:  Other Family Members, Friend Lives with:  Self Do you feel safe going back to the place where you live?  Yes Need for family participation in patient care:  No (Coment)  Care giving concerns: pt lives at home alone   Facilities manager / plan:  CSW spoke with pt about PT recommendation for SNF  Employment status:  Retired Forensic scientist:  Other (Comment Required) PT Recommendations:  [place] / Referral to community resources:  [place]  Patient/Family's Response to care:  Pt is agreeable to SNF if needed but is comfortable going home if that is what is being recommended.  Patient/Family's Understanding of and Emotional Response to Diagnosis, Current Treatment, and Prognosis: pt has no concerns about current treatment plan  Emotional Assessment Appearance:  Appears stated age Attitude/Demeanor/Rapport:    Affect (typically observed):  Calm, Appropriate, Pleasant Orientation:  Oriented to Situation, Oriented to  Time, Oriented to Place, Oriented to Self Alcohol / Substance use:  Not Applicable Psych involvement (Current and /or in the community):  No (Comment)  Discharge Needs   Concerns to be addressed:  Discharge Planning Concerns Readmission within the last 30 days:  Yes Current discharge risk:  Physical Impairment Barriers to Discharge:  Continued Medical Work up   Cranford Mon, LCSW 03/01/2015, 4:23 PM

## 2015-03-01 NOTE — Evaluation (Signed)
Occupational Therapy Evaluation Patient Details Name: Nicole Hess MRN: [id number] DOB: [birth year] Today's Date: 03/01/2015    History of Present Illness Patient is a 69 yo female admitted 02/26/15 with AS, now s/p AVR on 02/26/15.   PMH:  Gout, osteopenia, anxiety   Clinical Impression   Pt admitted with above. She demonstrates the below listed deficits and will benefit from continued OT to maximize safety and independence with BADLs.  Pt presents to OT with generalized weakness, but is doing very well.  Currently, she requires min guard assist for BADLs, and should progress quickly to modified independence.  Reviewed sternal precautions and safety with ADLs and IADLs, but will need reinforcement to ensure independence upon discharge.       Follow Up Recommendations  No OT follow up;Supervision - Intermittent    Equipment Recommendations  None recommended by OT    Recommendations for Other Services       Precautions / Restrictions Precautions Precautions: Sternal;Fall Restrictions Weight Bearing Restrictions: Yes Other Position/Activity Restrictions: Sternal precautions      Mobility Bed Mobility Overal bed mobility: Needs Assistance Bed Mobility: Sit to Sidelying Rolling: Min guard       Sit to sidelying: Min guard General bed mobility comments: verbal cues for technique  Transfers Overall transfer level: Needs assistance Equipment used: Rolling walker (2 wheeled) Transfers: Sit to/from [facility] Sit to Stand: Min guard Stand pivot transfers: Min guard       General transfer comment: verbal cues for precautions and hand placement     Balance Overall balance assessment: Needs assistance Sitting-balance support: Feet supported Sitting balance-Leahy Scale: Good     Standing balance support: During functional activity Standing balance-Leahy Scale: Fair                              ADL Overall ADL's : Needs  assistance/impaired Eating/Feeding: Independent   Grooming: Wash/dry hands;Wash/dry face;Oral care;Brushing hair;Supervision/safety;Standing   Upper Body Bathing: Set up;Sitting   Lower Body Bathing: Min guard;Sit to/from stand   Upper Body Dressing : Supervision/safety;Sitting   Lower Body Dressing: Min guard;Sit to/from stand   Toilet Transfer: Min guard;Ambulation;Comfort height toilet   Toileting- Clothing Manipulation and Hygiene: Min guard;Sit to/from stand       Functional mobility during ADLs: Min guard;Rolling walker General ADL Comments: reviewed sternal precautions and safety with ADLs and IADLs.  Pt with multiple questions and with anxiety re: fear of disrupting surgical site     Vision     Perception     Praxis      Pertinent Vitals/Pain Pain Assessment: No/denies pain     Hand Dominance     Extremity/Trunk Assessment Upper Extremity Assessment Upper Extremity Assessment: Overall WFL for tasks assessed   Lower Extremity Assessment Lower Extremity Assessment: Defer to PT evaluation   Cervical / Trunk Assessment Cervical / Trunk Assessment: Normal   Communication Communication Communication: No difficulties   Cognition Arousal/Alertness: Awake/alert Behavior During Therapy: WFL for tasks assessed/performed Overall Cognitive Status: Within Functional Limits for tasks assessed                     General Comments       Exercises       Shoulder Instructions      Home Living Family/patient expects to be discharged to:: Private residence Living Arrangements: Alone Available Help at Discharge: Friend(s);Neighbor;Available PRN/intermittently Type of Home: Other(Comment) (condo) Home Access: Elevator  Home Layout: One level     Bathroom Shower/Tub: Occupational psychologist: Standard     Home Equipment: Cane - single point          Prior Functioning/Environment Level of Independence: Independent         Comments: Active, walks a mile/day, just retired 02/15/15    OT Diagnosis: Generalized weakness   OT Problem List: Decreased strength;Decreased activity tolerance;Decreased knowledge of precautions;Decreased knowledge of use of DME or AE   OT Treatment/Interventions: Self-care/ADL training;DME and/or AE instruction;Therapeutic activities;Patient/family education    OT Goals(Current goals can be found in the care plan section) Acute Rehab OT Goals Patient Stated Goal: to go home safely  OT Goal Formulation: With patient Time For Goal Achievement: 03/08/15 Potential to Achieve Goals: Good ADL Goals Pt Will Perform Grooming: with modified independence;standing Pt Will Perform Upper Body Bathing: with modified independence;sitting;standing Pt Will Perform Lower Body Bathing: with modified independence;sit to/from stand Pt Will Perform Upper Body Dressing: with modified independence;sitting Pt Will Perform Lower Body Dressing: with modified independence;sit to/from stand Pt Will Transfer to Toilet: with modified independence;ambulating;regular height toilet Pt Will Perform Toileting - Clothing Manipulation and hygiene: with modified independence;sit to/from stand Pt Will Perform Tub/Shower Transfer: Shower transfer;with modified independence;ambulating Additional ADL Goal #1: Pt will be independent with sternal precautions   OT Frequency: Min 2X/week   Barriers to D/C:            Co-evaluation              End of Session Equipment Utilized During Treatment: Rolling walker Nurse Communication: Mobility status  Activity Tolerance: Patient tolerated treatment well Patient left: in bed;with call bell/phone within reach;with family/visitor present   Time: 1810-1844 OT Time Calculation (min): 34 min Charges:  OT General Charges $OT Visit: 1 Procedure OT Evaluation $Initial OT Evaluation Tier I: 1 Procedure OT Treatments $Self Care/Home Management : 8-22 mins G-Codes:     Sheamus Hasting M 03/27/2015, 7:20 PM

## 2015-03-01 NOTE — Progress Notes (Signed)
Physical Therapy Treatment Patient Details Name: Nicole Hess MRN: [id number] DOB: 02/29/1946 Today's Date: 03/01/2015    History of Present Illness Patient is a 69 yo female admitted 02/26/15 with AS, now s/p AVR on 02/26/15.   PMH:  Gout, osteopenia, anxiety    PT Comments    Pt admitted with above diagnosis. Pt currently with functional limitations due to balance and endurance deficits. Pt ambulated with supervision to min guard assist for ambulation and doing much better.  Should be able to go home with HHPT.  May need a RW or use her cane initially.  Will continue PT.  Pt will benefit from skilled PT to increase their independence and safety with mobility to allow discharge to the venue listed below.    Follow Up Recommendations  Home health PT;Supervision - Intermittent     Equipment Recommendations  Rolling walker with 5" wheels    Recommendations for Other Services       Precautions / Restrictions Precautions Precautions: Sternal;Fall Restrictions Other Position/Activity Restrictions: Sternal precautions    Mobility  Bed Mobility               General bed mobility comments: in chair on arrival.  Transfers Overall transfer level: Needs assistance Equipment used: Rolling walker (2 wheeled) Transfers: Sit to/from Stand Sit to Stand: Min assist;+2 safety/equipment         General transfer comment: Verbal cues for hand placement and technique.   Min assist to rise to standing and steady from bed.  Ambulation/Gait Ambulation/Gait assistance: Min guard Ambulation Distance (Feet): 500 Feet Assistive device: None Gait Pattern/deviations: Step-through pattern;Decreased stride length;Drifts right/left   Gait velocity interpretation: Below normal speed for age/gender General Gait Details: Pt much improved today.  One or two LOB needing steadying assist but overall did well.    Stairs            Wheelchair Mobility    Modified Rankin (Stroke Patients  Only)       Balance Overall balance assessment: Needs assistance         Standing balance support: No upper extremity supported;During functional activity Standing balance-Leahy Scale: Fair Standing balance comment: can stand statically and maintain balance.              High level balance activites: Direction changes;Turns;Sudden stops High Level Balance Comments: min guard assist with challenges.     Cognition Arousal/Alertness: Awake/alert Behavior During Therapy: WFL for tasks assessed/performed Overall Cognitive Status: Within Functional Limits for tasks assessed                      Exercises General Exercises - Lower Extremity Long Arc Quad: AROM;Both;10 reps;Seated    General Comments        Pertinent Vitals/Pain Pain Assessment: No/denies pain  VSS    Home Living                      Prior Function            PT Goals (current goals can now be found in the care plan section) Progress towards PT goals: Progressing toward goals    Frequency  Min 3X/week    PT Plan Discharge plan needs to be updated    Co-evaluation             End of Session Equipment Utilized During Treatment: Gait belt Activity Tolerance: Patient tolerated treatment well Patient left: in chair;with call bell/phone within reach;with chair alarm  set     Time: 9024-0973 PT Time Calculation (min) (ACUTE ONLY): 15 min  Charges:  $Gait Training: 8-22 mins                    G Codes:      Denice Paradise 03-19-2015, 3:03 PM [employer] Acute Rehabilitation [phone] (pager)

## 2015-03-01 NOTE — Progress Notes (Signed)
CSW saw most recent PT note- pt is walking 565ft pt is no longer SNF appropriate- CSW informed pt  CSW signing off.  Domenica Reamer, [place] Social Worker [phone]

## 2015-03-01 NOTE — Progress Notes (Signed)
3 Days Post-Op Procedure(s) (LRB): AORTIC VALVE REPLACEMENT (AVR) (N/A) TRANSESOPHAGEAL ECHOCARDIOGRAM (TEE) (N/A) Subjective:  No complaints  Objective: Vital signs in last 24 hours: Temp:  [98.1 F (36.7 C)-99.4 F (37.4 C)] 98.4 F (36.9 C) (06/17 0722) Pulse Rate:  [60-106] 86 (06/17 0700) Cardiac Rhythm:  [-] Atrial paced (06/17 0755) Resp:  [11-26] 16 (06/17 0700) BP: (76-149)/(40-128) 90/66 mmHg (06/17 0700) SpO2:  [91 %-100 %] 100 % (06/17 0700) Weight:  [68 kg (149 lb 14.6 oz)] 68 kg (149 lb 14.6 oz) (06/17 0600)  Hemodynamic parameters for last 24 hours:    Intake/Output from previous day: 06/16 0701 - 06/17 0700 In: 974.5 [P.O.:110; I.V.:864.5] Out: 2175 [Urine:2175] Intake/Output this shift:    General appearance: alert and cooperative Neurologic: intact Heart: regular rate and rhythm, S1, S2 normal, no murmur, click, rub or gallop Lungs: clear to auscultation bilaterally Extremities: extremities normal, atraumatic, no cyanosis or edema Wound: dressing dry  Lab Results:  Recent Labs  02/27/15 1700 02/27/15 1706 02/28/15 0400  WBC 9.6  --  11.5*  HGB 9.4* 9.9* 9.2*  HCT 28.3* 29.0* 27.2*  PLT 104*  --  113*   BMET:  Recent Labs  02/27/15 0426  02/27/15 1706 02/28/15 0400  NA 139  --  135 133*  K 3.8  --  3.8 3.9  CL 107  --  101 100*  CO2 26  --   --  26  GLUCOSE 143*  --  147* 138*  BUN 12  --  13 13  CREATININE 0.63  < > 0.60 0.51  CALCIUM 8.0*  --   --  8.0*  < > = values in this interval not displayed.  PT/INR:  Recent Labs  02/26/15 1214  LABPROT 18.2*  INR 1.50*   ABG    Component Value Date/Time   PHART 7.359 02/26/2015 2203   HCO3 25.8* 02/26/2015 2203   TCO2 23 02/27/2015 1706   O2SAT 100.0 02/26/2015 2203   CBG (last 3)   Recent Labs  02/27/15 1911 02/28/15 02/28/15 0307  GLUCAP 162* 99 137*    Assessment/Plan: S/P Procedure(s) (LRB): AORTIC VALVE REPLACEMENT (AVR) (N/A) TRANSESOPHAGEAL ECHOCARDIOGRAM  (TEE) (N/A)  She still requires neo to maintain mean BP over 60. Started on Midodrine 5 tid last night. Will increase to 10 mg tid. She had a low normal BP preop and now has expected acute blood loss anemia, pain meds that will lower BP. Will continue to try to wean neo today. She is sinus in the 60's which is her baseline. I am pacing at 80 to raise BP.   LOS: 3 days    Gaye Pollack 03/01/2015

## 2015-03-02 LAB — BASIC METABOLIC PANEL
ANION GAP: 6 (ref 5–15)
BUN: 12 mg/dL (ref 6–20)
CO2: 28 mmol/L (ref 22–32)
Calcium: 7.9 mg/dL — ABNORMAL LOW (ref 8.9–10.3)
Chloride: 104 mmol/L (ref 101–111)
Creatinine, Ser: 0.65 mg/dL (ref 0.44–1.00)
GFR calc non Af Amer: >60 mL/min (ref >60)
Glucose, Bld: 99 mg/dL (ref 65–99)
Potassium: 3.9 mmol/L (ref 3.5–5.1)
SODIUM: 138 mmol/L (ref 135–145)

## 2015-03-02 MED ORDER — OXYCODONE HCL 5 MG PO TABS: 5.0000 mg | ORAL_TABLET | ORAL | Status: DC | PRN

## 2015-03-02 NOTE — Progress Notes (Signed)
Pt called out for staff calmly asking about her home keys. Pt assisted to look through belongings without finding any. Inquiry was made that perhaps she had given them to her HCPOA and Pt stated that she didn't remember doing that and that she misplaced them before after a "procedure and had left them at home". Reassurances was provided that contact would be made in the morning with her HCPOA and superintendent of the apartments she stays at to check at her home for keys.

## 2015-03-02 NOTE — Plan of Care (Signed)
Problem: Phase II - Intermediate Post-Op Goal: Advance Diet Outcome: Completed/Met Date Met:  03/02/15 Pt tolerating heart healthy diet without difficulties, 1st BM 03-01-15.

## 2015-03-02 NOTE — Progress Notes (Signed)
4 Days Post-Op Procedure(s) (LRB): AORTIC VALVE REPLACEMENT (AVR) (N/A) TRANSESOPHAGEAL ECHOCARDIOGRAM (TEE) (N/A) Subjective: Doing well BP stable  tx to floor, home in 2-3 days  Objective: Vital signs in last 24 hours: Temp:  [97.8 F (36.6 C)-98.7 F (37.1 C)] 98.6 F (37 C) (06/18 0700) Pulse Rate:  [56-99] 83 (06/18 1000) Cardiac Rhythm:  [-] Atrial paced (06/18 0800) Resp:  [11-26] 18 (06/18 1000) BP: (82-123)/(46-90) 104/57 mmHg (06/18 1000) SpO2:  [86 %-100 %] 98 % (06/18 1000) Weight:  [148 lb 9.4 oz (67.4 kg)] 148 lb 9.4 oz (67.4 kg) (06/18 0400)  Hemodynamic parameters for last 24 hours:  nsr  Intake/Output from previous day: 06/17 0701 - 06/18 0700 In: 1362.6 [P.O.:1070; I.V.:292.6] Out: 1825 [Urine:1825] Intake/Output this shift: Total I/O In: 360 [P.O.:360] Out: -   No murmur Incision clean, dry Lab Results:  Recent Labs  02/27/15 1700 02/27/15 1706 02/28/15 0400  WBC 9.6  --  11.5*  HGB 9.4* 9.9* 9.2*  HCT 28.3* 29.0* 27.2*  PLT 104*  --  113*   BMET:  Recent Labs  02/28/15 0400 03/02/15 0406  NA 133* 138  K 3.9 3.9  CL 100* 104  CO2 26 28  GLUCOSE 138* 99  BUN 13 12  CREATININE 0.51 0.65  CALCIUM 8.0* 7.9*    PT/INR: No results for input(s): LABPROT, INR in the last 72 hours. ABG    Component Value Date/Time   PHART 7.359 02/26/2015 2203   HCO3 25.8* 02/26/2015 2203   TCO2 23 02/27/2015 1706   O2SAT 100.0 02/26/2015 2203   CBG (last 3)   Recent Labs  02/27/15 1911 02/28/15 02/28/15 0307  GLUCAP 162* 99 137*    Assessment/Plan: S/P Procedure(s) (LRB): AORTIC VALVE REPLACEMENT (AVR) (N/A) TRANSESOPHAGEAL ECHOCARDIOGRAM (TEE) (N/A) Transfer to telemetry Stop pacing tomorrow  LOS: 4 days    Tharon Aquas Trigt III 03/02/2015

## 2015-03-03 ENCOUNTER — Inpatient Hospital Stay (HOSPITAL_COMMUNITY): Payer: PPO

## 2015-03-03 MED ORDER — FUROSEMIDE 40 MG PO TABS
40.0000 mg | ORAL_TABLET | Freq: Every day | ORAL | Status: DC
  Administered 2015-03-03 – 2015-03-04 (×2): 40 mg via ORAL
  Filled 2015-03-03 (×2): qty 1

## 2015-03-03 MED ORDER — POTASSIUM CHLORIDE CRYS ER 20 MEQ PO TBCR
20.0000 meq | EXTENDED_RELEASE_TABLET | Freq: Every day | ORAL | Status: DC
  Administered 2015-03-03 – 2015-03-04 (×2): 20 meq via ORAL
  Filled 2015-03-03 (×2): qty 1

## 2015-03-03 NOTE — Progress Notes (Signed)
Pt ambulated in hall independently and tolerated well. She came to the nurse's desk for ice cream.

## 2015-03-03 NOTE — Progress Notes (Addendum)
Pt ambulated with rolling walker approximately 300-400 feet without any adverse effects.

## 2015-03-03 NOTE — Progress Notes (Addendum)
Pt ambulated 1000 ft with rolling walker.  Pt tolerated well.  Claudette Stapler, RN

## 2015-03-03 NOTE — Progress Notes (Signed)
Epicardial pacing wires X2 removed per protocol without difficulty. VS and heart rhythm stable. Pt tolelrated well. Pt on bedrest for one hour with VS monitoring.

## 2015-03-03 NOTE — Discharge Summary (Signed)
Physician Discharge Summary       [address]       [place] [address]             [phone]    Patient ID: Nicole Hess MRN: [id number] DOB/AGE: 03-16-1946 69 y.o.  Admit date: 02/26/2015 Discharge date: 03/04/2015  Admission Diagnoses: 1. Severe aortic stenosis 2. Bicuspid aortic valve 3. History of gout 4. History of osteopenia 5. History of leukopenia 6. History of anxiety  Discharge Diagnoses:  1. Severe aortic stenosis 2. Bicuspid aortic valve 3. History of gout 4. History of osteopenia 5. History of leukopenia 6. History of anxiety 7. ABL anemia 8. Mild thrombocytopenia  Procedure (s):  1. Median Sternotomy 2. Extracorporeal circulation 3. Aortic valve replacement using a 21 mm Edwards Magna-Ease valve by Dr. Cyndia Bent on 02/26/2015.  History of Presenting Illness: The patient is a 69 year old woman with a history of bicuspid aortic valve disease that has been followed by Dr. Martinique with echo. Her most recent echo on 10/22/2014 showed progression to severe aortic stenosis with a mean gradient of 40 mm Hg and a valve area of 0.85 cm2. The valve is bicuspid with thickened, calcified leaflets with severely restricted motion. LVEF is 60-65% with no MS or MR. She underwent R and L heart cath on 01/29/2015 that showed normal coronaries with a mean transvalvular aortic gradient of 29 mm Hg with a peak of 32 mm Hg. Right heart pressures were normal.   She lives alone at [facility] and walks daily on the paths around [facility] about 1 mile with no symptoms of shortness of breath, chest pain or dizziness. She does report fatigue and shortness of breath with walking up stairs or inclines.   Dr. Cyndia Bent discussed the need for a median sternotomy for aortic valve replacement. Potential risks, benefits, and complications were discussed with the patient and she agreed to proceed with surgery. Pre operative carotid duplex US showed no significant carotid artery  stenosis bilaterally. Patient underwent an AVR on 02/26/2015.  Brief Hospital Course:  The patient was extubated the evening of surgery without difficulty. She remained afebrile and hemodynamically stable. She was initially AAI paced. She was eventually weaned off Neo synephrine drip. Gordy Councilman, a line, chest tubes, and foley were removed early in the post operative course. She was put on Midodrine for labile BP. She had ABL anemia. She did not require a post op transfusion. Her last H and H was 9.2 and 27.2 She was weaned off the insulin drip. The patient's glucose remained well controlled. The patient's HGA1C pre op was 5.5. The patient was felt surgically stable for transfer from the ICU to PCTU for further convalescence on 03/02/2015. She was volume over loaded and diuresed as BP allowed. She continues to progress with cardiac rehab. She was ambulating on room air. She has been tolerating a diet and has had a bowel movement. Epicardial pacing wires will be removed on 06/19. Our office will  remove the chest tube sutures on 03/08/2015 . Unfortunately, her insurance will not cover SNF placement. She has a $100 co pay for [facility]. Patient is going to go home and have friends help.The patient is felt surgically stable for discharge today.  Latest Vital Signs: Blood pressure 127/58, pulse 82, temperature 98.6 F (37 C), temperature source Oral, resp. rate 16, height 5\' 8"  (1.727 m), weight 149 lb 4 oz (67.7 kg), last menstrual period 09/15/1987, SpO2 100 %.  Physical Exam: Cardiovascular:  RRR, no murmur Pulmonary: Clear to auscultation bilaterally; no rales, wheezes, or rhonchi. Abdomen: Soft, non tender, bowel sounds present. Extremities: Mild bilateral lower extremity edema. Wounds: Clean and dry. No erythema or signs of infection.  Discharge Condition:Stable and discharged to home  Recent laboratory studies:  Lab Results  Component Value Date   WBC 11.5* 02/28/2015   HGB 9.2* 02/28/2015   HCT  27.2* 02/28/2015   MCV 94.8 02/28/2015   PLT 113* 02/28/2015   Lab Results  Component Value Date   NA 138 03/02/2015   K 3.9 03/02/2015   CL 104 03/02/2015   CO2 28 03/02/2015   CREATININE 0.65 03/02/2015   GLUCOSE 99 03/02/2015    Diagnostic Studies: Dg Chest 2 View  03/03/2015   CLINICAL DATA:  Short of breath, cardiac surgery  EXAM: CHEST  2 VIEW  COMPARISON:  02/28/2015  FINDINGS: Interval removal of right IJ sheath. Sternotomy wires overlie stable cardiac silhouette. There are small bilateral pleural effusions. No pulmonary edema. No pneumothorax.  IMPRESSION: Bilateral pleural effusions.  No edema or pneumothorax.   Electronically Signed   By: Suzy Bouchard M.D.   On: 03/03/2015 08:50      Discharge Instructions    Amb Referral to Cardiac Rehabilitation    Complete by:  As directed   Congestive Heart Failure: If diagnosis is Heart Failure, patient MUST meet each of the CMS criteria: 1. Left Ventricular Ejection Fraction </= 35% 2. NYHA class II-IV symptoms despite being on optimal heart failure therapy for at least 6 weeks. 3. Stable = have not had a recent (<6 weeks) or planned (<6 months) major cardiovascular hospitalization or procedure  Program Details: - Physician supervised classes - 1-3 classes per week over a 12-18 week period, generally for a total of 36 sessions  Physician Certification: I certify that the above Cardiac Rehabilitation treatment is medically necessary and is medically approved by me for treatment of this patient. The patient is willing and cooperative, able to ambulate and medically stable to participate in exercise rehabilitation. The participant's progress and Individualized Treatment Plan will be reviewed by the Medical Director, Cardiac Rehab staff and as indicated by the Referring/Ordering Physician.  Diagnosis:  Valve Replacement/Repair          Discharge Medications:   Medication List    STOP taking these medications         Echinacea 125 MG Caps      TAKE these medications        aspirin 325 MG EC tablet  Take 1 tablet (325 mg total) by mouth daily.     b complex vitamins tablet  Take 1 tablet by mouth daily.     CALTRATE 600+D PO  Take 1 tablet by mouth 2 (two) times daily.     estradiol 0.5 MG tablet  Commonly known as:  ESTRACE  Take 1 tablet (0.5 mg total) by mouth daily.     Estradiol 10 MCG Tabs vaginal tablet  Commonly known as:  VAGIFEM  INSERT 1 TABLET VAGINALLY 2 TIMES A WEEK     furosemide 40 MG tablet  Commonly known as:  LASIX  Take 1 tablet (40 mg total) by mouth daily. For 5 days then stop.     midodrine 10 MG tablet  Commonly known as:  PROAMATINE  Take 1 tablet (10 mg total) by mouth 3 (three) times daily with meals.     multivitamin with minerals Tabs tablet  Take 1 tablet by mouth daily.  OMEGA-3 FISH OIL PO  Take 1 capsule by mouth 2 (two) times daily.     OSTEO BI-FLEX REGULAR STRENGTH PO  Take 1 tablet by mouth 2 (two) times daily.     oxyCODONE 5 MG immediate release tablet  Commonly known as:  Oxy IR/ROXICODONE  Take 1 tablet (5 mg total) by mouth every 3 (three) hours as needed for severe pain.     Potassium Chloride ER 20 MEQ Tbcr  Take 20 mEq by mouth daily. For 5 days then stop.     pravastatin 80 MG tablet  Commonly known as:  PRAVACHOL  Take 80 mg by mouth daily.     primidone 250 MG tablet  Commonly known as:  MYSOLINE  Take 250 mg by mouth 2 (two) times daily.     RETIN-A MICRO EX  Apply 1 application topically daily.     temazepam 30 MG capsule  Commonly known as:  RESTORIL  Take 30 mg by mouth at bedtime as needed for sleep. For sleep     vitamin B-12 50 MCG tablet  Commonly known as:  CYANOCOBALAMIN  Take 50 mcg by mouth daily.     vitamin C 1000 MG tablet  Take 1,000 mg by mouth daily.        The patient has been discharged on:   1.Beta Blocker:  Yes [   ]                              No   [ x  ]                               If No, reason:Labile BP  2.Ace Inhibitor/ARB: Yes [   ]                                     No  [  x  ]                                     If No, reason:Labile BP  3.Statin:   Yes [ x  ]                  No  [   ]                  If No, reason:  4.Ecasa:  Yes  [ x  ]                  No   [   ]                  If No, reason:  Follow Up Appointments: Follow-up Information    Follow up with Peter Martinique, MD.   Specialty:  Cardiology   Why:  Call for a follow up appointment for 2 weeks   Contact information:   [place]  [address] [phone]       Follow up with Gaye Pollack, MD On 04/03/2015.   Specialty:  Cardiothoracic Surgery   Why:  PA/LAT CXR to be taken (at [facility] which is in the same building as Dr. Vivi Martens office) on 04/03/2015 at 8:45 am;Appointment time  is at 9:30 am   Contact information:   [address] [address] [phone]       Follow up with [facility].   Why:  Please remove chest tube sutures on 03/08/2015      Follow up with [facility].   Why:  Games developer information:   [address] [place] [address] [phone]       Follow up with [facility].   Why:  Registered Nurse, Physical Therapist   Contact information:   [address] [place] [address] [phone]       Signed: Lars Pinks MPA-C 03/04/2015, 1:13 PM

## 2015-03-03 NOTE — Discharge Instructions (Signed)
We ask the SNF to please do the following: 1. Please obtain vital signs at least one time daily 2.Please weigh the patient daily. If he or she continues to gain weight or develops lower extremity edema, contact the office at [phone]. 3. Ambulate patient at least three times daily and please use sternal precautions.   Activity: 1.May walk up steps                2.No lifting more than ten pounds for four weeks.                 3.No driving for four weeks.                4.Stop any activity that causes chest pain, shortness of breath, dizziness,sweating or excessive weakness.                5.Avoid straining.                6.Continue with your breathing exercises daily.  Diet: Low fat, Low salt diet  Wound Care: May shower.  Clean wounds with mild soap and water daily. Contact the office at [phone] if any problems arise.  Aortic Valve Replacement, Care After Refer to this sheet in the next few weeks. These instructions provide you with information on caring for yourself after your procedure. Your health care provider may also give you specific instructions. Your treatment has been planned according to current medical practices, but problems sometimes occur. Call your health care provider if you have any problems or questions after your procedure. HOME CARE INSTRUCTIONS   Take medicines only as directed by your health care provider.  If your health care provider has prescribed elastic stockings, wear them as directed.  Take frequent naps or rest often throughout the day.  Avoid lifting over 10 lbs (4.5 kg) or pushing or pulling things with your arms for 6-8 weeks or as directed by your health care provider.  Avoid driving or airplane travel for 4-6 weeks after surgery or as directed by your health care provider. If you are riding in a car for an extended period, stop every 1-2 hours to stretch your legs. Keep a record of your medicines and medical history with you when  traveling.  Do not drive or operate heavy machinery while taking pain medicine. (narcotics).  Do not cross your legs.  Do not use any tobacco products including cigarettes, chewing tobacco, or electronic cigarettes. If you need help quitting, ask your health care provider.  Do not take baths, swim, or use a hot tub until your health care provider approves. Take showers once your health care provider approves. Pat incisions dry. Do not rub incisions with a washcloth or towel.  Avoid climbing stairs and using the handrail to pull yourself up for the first 2-3 weeks after surgery.  Return to work as directed by your health care provider.  Drink enough fluid to keep your urine clear or pale yellow.  Do not strain to have a bowel movement. Eat high-fiber foods if you become constipated. You may also take a medicine to help you have a bowel movement (laxative) as directed by your health care provider.  Resume sexual activity as directed by your health care provider. Men should not use medicines for erectile dysfunction until their doctor says it isokay.  If you had a certain type of heart condition in the past, you may need to take antibiotic medicine before having dental work or surgery.  Let your dentist and health care providers know if you had one or more of the following:  Previous endocarditis.  An artificial (prosthetic) heart valve.  Congenital heart disease. SEEK MEDICAL CARE IF:  You develop a skin rash.   You experience sudden changes in your weight.  You have a fever. SEEK IMMEDIATE MEDICAL CARE IF:   You develop chest pain that is not coming from your incision.  You have drainage (pus), redness, swelling, or pain at your incision site.   You develop shortness of breath or have difficulty breathing.   You have increased bleeding from your incision site.   You develop light-headedness.  MAKE SURE YOU:   Understand these directions.  Will watch your  condition.  Will get help right away if you are not doing well or get worse. Document Released: 03/19/2005 Document Revised: 01/15/2014 Document Reviewed: 06/14/2012 [facility] Patient Information 2015 [place] [employer], [place]. This information is not intended to replace advice given to you by your health care provider. Make sure you discuss any questions you have with your health care provider.

## 2015-03-03 NOTE — Progress Notes (Addendum)
      [address]       ,[place] [address]             [phone]        5 Days Post-Op Procedure(s) (LRB): AORTIC VALVE REPLACEMENT (AVR) (N/A) TRANSESOPHAGEAL ECHOCARDIOGRAM (TEE) (N/A)  Subjective: Patient without complaints.  Objective: Vital signs in last 24 hours: Temp:  [98 F (36.7 C)-99.1 F (37.3 C)] 99.1 F (37.3 C) (06/19 0548) Pulse Rate:  [71-90] 85 (06/19 0548) Cardiac Rhythm:  [-] Normal sinus rhythm (06/18 1930) Resp:  [12-19] 12 (06/19 0548) BP: (92-126)/(51-71) 120/61 mmHg (06/19 0548) SpO2:  [92 %-100 %] 96 % (06/19 0548) Weight:  [148 lb 11.2 oz (67.45 kg)] 148 lb 11.2 oz (67.45 kg) (06/19 0548)  Pre op weight 60 kg Current Weight  03/03/15 148 lb 11.2 oz (67.45 kg)      Intake/Output from previous day: 06/18 0701 - 06/19 0700 In: 840 [P.O.:840] Out: 750 [Urine:750]   Physical Exam:  Cardiovascular: RRR, no murmur Pulmonary: Clear to auscultation bilaterally; no rales, wheezes, or rhonchi. Abdomen: Soft, non tender, bowel sounds present. Extremities: Mild bilateral lower extremity edema. Wounds: Clean and dry.  No erythema or signs of infection.  Lab Results: CBC:No results for input(s): WBC, HGB, HCT, PLT in the last 72 hours. BMET:  Recent Labs  03/02/15 0406  NA 138  K 3.9  CL 104  CO2 28  GLUCOSE 99  BUN 12  CREATININE 0.65  CALCIUM 7.9*    PT/INR:  Lab Results  Component Value Date   INR 1.50* 02/26/2015   INR 1.07 02/22/2015   INR 1.10 01/22/2015   ABG:  INR: Will add last result for INR, ABG once components are confirmed Will add last 4 CBG results once components are confirmed  Assessment/Plan:  1. CV - SR in the 80's. Previous labile BP-on Midodrine 10 mg tid. 2.  Pulmonary - On room air. Encourage incentive spirometer 3. Volume Overload - Give Lasix this am as BP improved. 4.  Acute blood loss anemia - Last H and H 9.2 and 27.2 5. Remove EPW 6. Possible discharge to SNF in  am   ZIMMERMAN,DONIELLE MPA-C 03/03/2015,8:51 AM  Plan discharge to SNF when arrangements mad She looks great today patient examined and medical record reviewed,agree with above note. Tharon Aquas Trigt III 03/03/2015

## 2015-03-04 MED ORDER — OXYCODONE HCL 5 MG PO TABS: 5.0000 mg | ORAL_TABLET | ORAL | Status: DC | PRN

## 2015-03-04 MED ORDER — ASPIRIN 325 MG PO TBEC: 325.0000 mg | DELAYED_RELEASE_TABLET | Freq: Every day | ORAL | Status: DC

## 2015-03-04 MED ORDER — MIDODRINE HCL 10 MG PO TABS: 10.0000 mg | ORAL_TABLET | Freq: Three times a day (TID) | ORAL | Status: DC

## 2015-03-04 MED ORDER — POTASSIUM CHLORIDE ER 20 MEQ PO TBCR: 20.0000 meq | EXTENDED_RELEASE_TABLET | Freq: Every day | ORAL | Status: DC

## 2015-03-04 MED ORDER — FUROSEMIDE 40 MG PO TABS: 40.0000 mg | ORAL_TABLET | Freq: Every day | ORAL | Status: DC

## 2015-03-04 NOTE — Care Management Note (Addendum)
Case Management Note  Patient Details  Name: JAKIYA BOOKBINDER MRN: [id number] Date of Birth: 1945-10-13  Subjective/Objective:   Pt admitted with aortic stenosis        Action/Plan:  Pt is from home alone.  CM will continue to monitor for disposition needs.   Expected Discharge Date:                  Expected Discharge Plan:  [place]  In-House Referral:  Clinical Social Work  Discharge planning Services  CM Consult  Post Acute Care Choice:    Choice offered to:   Patient  DME Arranged:   Vassie Moselle DME Agency:   [facility]  HH Arranged:  RN, Disease Management, PT [place] Agency:  [facility]  Status of Service:  Completed, signed off  Medicare Important Message Given:  Yes Date Medicare IM Given:  03/04/15 Medicare IM give by:  Elenor Quinones Date Additional Medicare IM Given:    Additional Medicare Important Message give by:     If discussed at [place] of Stay Meetings, dates discussed:    Additional Comments: CM informed by advanced agency that pt copay for [facility] is greater than $100, agency resource will provide copay information to pt.  CM contacted case manager with insurance and requested telephonic RN service, insurance agree and will initiate telephonic RN management.  CM discussed at length disposition plan to home with home health with pt during am assessment.  Per pt she would have preferred to go to short term SNF for approximately one week post discharge because she is concerned with not being able to drive.  SW consulted, insurance will not approve SNF placement due to pt current assessment.  SW made pt aware, per pt she has neighbors and friends who will provide any assistance when needed.  PT recommended SNF per pt wishes, however also recommended HHPT if unable to get approved for SNF.  CM offered pt choice for HHRN, HHPT and rolling walker, pt chose [facility], CM contacted both [facility] and DME agency, referral  was accepted.  CM verified phone number [phone] and address in epic, CM communicated with agency. CM requested bedside nurse to obtain [facility] recommendations order from MD.   No other CM needs at this time.  Maryclare Labrador, RN 03/04/2015, 11:15 AM

## 2015-03-04 NOTE — Progress Notes (Signed)
Discharge Med Rec Note: [place], [place]  The following medications were initiated, discontinued, or adjusted during Ms. Fosco's stay for bioprosthetic aortic valve replacement (AVR) on 02/26/2015.  Initiated: Aspirin 325 mg PO daily. After her AVR on 6/14, Ms. Nghiem was initiated on aspirin for a reduction in the risk of thromboembolic events including stroke.   Midodrine 10 mg PO TID. In the setting of low blood pressures throughout her stay, Ms. Spies was initiated on midodrine in an attempt to maintain systolic blood pressures >643 mmHg.  Furosemide 40 mg PO daily x 5 days. This medication was initiated for [name] for postoperative volume overload. As she is still above her baseline weight, will continue upon discharge.   Potassium 20 mEq PO daily x 5 days. This medication was initiated during Ms. Heikkila's stay to prevent against low potassium levels that may occur with new furosemide use.   Oxycodone 5 mg PO PRN for severe pain. Ms. Raine was discharged with a prescription for this medication for as needed use of severe postoperative pain.  Adjusted: N/A  Discontinued: Echinacea 125 mg PO daily. Ms. Wise was counseled to discontinue use of this medication as may increase the risk of bleeding in the setting of concomitant aspirin use.    Meds were reconciled in Epic and patient was counseled on date of discharge.  Ruta Hinds. Velva Harman, PharmD, [place] Clinical Pharmacist - Resident Pager: [phone] Pharmacy: [phone] 03/04/2015 9:36 AM

## 2015-03-04 NOTE — Progress Notes (Addendum)
1:30pm CSW received phone call and text from pt stating that she is still interested in SNF potentially- she is very anxious and unsure of what to do.  CSW paged Dr to discuss if they feel that home is a safe option for the pt.  Pt says she would be agreeable to [facility] if she does go to rehab- CSW informed [employer] RN who will appeal to the medical director for approval- CSW informed pt that approval is unlikely   11am CSW received call from pt requesting SNF- CSW informed insurance representative that pt was interested in one week of SNF before returning home- insurance unable to approve SNF stay- pt consistently walking over 500 ft independently.  Pt states that she has a neighbor who can help at home if need be and does feel comfortable getting up and walking by herself.  CSW informed RNCM  CSW signing off.  Domenica Reamer, [place] Social Worker [phone]

## 2015-03-04 NOTE — Progress Notes (Signed)
Spoke with pt. Regarding care and assistance at home after discharge. Pt. Stated she does not have anyone to help her at home, and she has no family.

## 2015-03-04 NOTE — Progress Notes (Signed)
CARDIAC REHAB PHASE I   PRE:  Rate/Rhythm: 85 SR    BP: sitting 110/60 right arm     SaO2: 97 RA  MODE:  Ambulation: 550 ft   POST:  Rate/Rhythm: 103 ST    BP: sitting 130/70     SaO2: 98 RA   Pt sts she walked earlier today but could not st if she walked by herself or with someone. When I described appearance of PT, she did not recognize that appearance. Pt able to stand and walk. Began with RW but able to walk 400 ft without it, steady. No c/o, VSS. Pt does not have anyone to stay with her at home. Ed completed with pt. Voiced understanding. Interested in [facility] and will send referral to [place].  [phone]  Josephina Shih [place] CES, ACSM 03/04/2015 10:13 AM

## 2015-03-04 NOTE — Progress Notes (Addendum)
Occupational Therapy Treatment Patient Details Name: Nicole Hess MRN: [id number] DOB: 10-25-1945 Today's Date: 03/04/2015    History of present illness Patient is a 69 yo female admitted 02/26/15 with AS, now s/p AVR on 02/26/15.   PMH:  Gout, osteopenia, anxiety   OT comments  Pt. Progressing well with acute OT goals and is ready for d/c from OT.  Demonstrates safety with all I/ADLS with integration of sternal precautions without cueing.  Eager for d/c home.    Follow Up Recommendations  No OT follow up;Supervision - Intermittent    Equipment Recommendations  None recommended by OT    Recommendations for Other Services      Precautions / Restrictions Precautions Precautions: Sternal;Fall Restrictions Weight Bearing Restrictions: Yes Other Position/Activity Restrictions: Sternal precautions       Mobility Bed Mobility Overal bed mobility: Modified Independent             General bed mobility comments: hob flat, no rails, exits from the right side at home  Transfers Overall transfer level: Modified independent   Transfers: Sit to/from Stand;Stand Pivot Transfers Sit to Stand: Modified independent (Device/Increase time) Stand pivot transfers: Modified independent (Device/Increase time)       General transfer comment: utilizing heart pillow to maintain sternal precautions, no safety concerns noted    Balance                                   ADL Overall ADL's : Modified independent     Grooming: Wash/dry hands;Wash/dry face;Oral care;Modified independent;Standing                   Toilet Transfer: Modified Independent;Ambulation   Toileting- Clothing Manipulation and Hygiene: Modified independent;Sitting/lateral lean       Functional mobility during ADLs: Modified independent General ADL Comments: pt. doing great. able to incorporate sternal precuations without cueing into all adls      Vision                      Perception     Praxis      Cognition     Overall Cognitive Status: Within Functional Limits for tasks assessed                       Extremity/Trunk Assessment               Exercises     Shoulder Instructions       General Comments      Pertinent Vitals/ Pain       Pain Assessment: No/denies pain  Home Living                                          Prior Functioning/Environment              Frequency Min 2X/week     Progress Toward Goals  OT Goals(current goals can now be found in the care plan section)  Progress towards OT goals: Goals met/education completed, patient discharged from [facility] Discharge plan remains appropriate    Co-evaluation                 End of Session Equipment Utilized During Treatment: Gait belt   Activity Tolerance Patient tolerated treatment well  Patient Left in chair;with call bell/phone within reach   Nurse Communication          Time: [phone] OT Time Calculation (min): 15 min  Charges: OT General Charges $OT Visit: 1 Procedure OT Treatments $Self Care/Home Management : 8-22 mins  Janice Coffin, COTA/L 03/04/2015, 8:18 AM    Agree with note Maurie Boettcher, OTR/L  [phone] 03/04/2015

## 2015-03-04 NOTE — Progress Notes (Signed)
      [address]       [place] [address]             [phone]        6 Days Post-Op Procedure(s) (LRB): AORTIC VALVE REPLACEMENT (AVR) (N/A) TRANSESOPHAGEAL ECHOCARDIOGRAM (TEE) (N/A)  Subjective: Patient about to eat breakfast. No complaints.  Objective: Vital signs in last 24 hours: Temp:  [98 F (36.7 C)-98.6 F (37 C)] 98.6 F (37 C) (06/20 0453) Pulse Rate:  [74-83] 82 (06/20 0453) Cardiac Rhythm:  [-] Normal sinus rhythm (06/20 0030) Resp:  [16] 16 (06/20 0453) BP: (101-127)/(50-63) 127/58 mmHg (06/20 0453) SpO2:  [100 %] 100 % (06/20 0453) Weight:  [149 lb 4 oz (67.7 kg)] 149 lb 4 oz (67.7 kg) (06/20 0453)  Pre op weight 60 kg Current Weight  03/04/15 149 lb 4 oz (67.7 kg)      Intake/Output from previous day: 06/19 0701 - 06/20 0700 In: 360 [P.O.:360] Out: 250 [Urine:250]   Physical Exam:  Cardiovascular: RRR, no murmur Pulmonary: Clear to auscultation bilaterally; no rales, wheezes, or rhonchi. Abdomen: Soft, non tender, bowel sounds present. Extremities: Mild bilateral lower extremity edema. Wounds: Clean and dry.  No erythema or signs of infection.  Lab Results: CBC:No results for input(s): WBC, HGB, HCT, PLT in the last 72 hours. BMET:   Recent Labs  03/02/15 0406  NA 138  K 3.9  CL 104  CO2 28  GLUCOSE 99  BUN 12  CREATININE 0.65  CALCIUM 7.9*    PT/INR:  Lab Results  Component Value Date   INR 1.50* 02/26/2015   INR 1.07 02/22/2015   INR 1.10 01/22/2015   ABG:  INR: Will add last result for INR, ABG once components are confirmed Will add last 4 CBG results once components are confirmed  Assessment/Plan:  1. CV - SR in the 80's. Previous labile BP has now improved. On Midodrine 10 mg tid. 2.  Pulmonary - On room air. Encourage incentive spirometer 3. Volume Overload - Continue with daily Lasix 4.  Acute blood loss anemia - Last H and H 9.2 and 27.2 5. To SNF when bed  available   ZIMMERMAN,DONIELLE MPA-C 03/04/2015,7:17 AM

## 2015-03-04 NOTE — Progress Notes (Signed)
Physical Therapy Treatment Patient Details Name: Nicole Hess MRN: [id number] DOB: [birth year] Today's Date: 03/04/2015    History of Present Illness Patient is a 69 yo female admitted 02/26/15 with AS, now s/p AVR on 02/26/15.   PMH:  Gout, osteopenia, anxiety    PT Comments    Pt progressing towards physical therapy goals. Functionally, pt is doing well however continues to require cues and assist for safety and maintenance of sternal precautions. Pt lives alone and expresses concern with returning home at d/c, stating she would be more comfortable going to short-term rehab until she could safely be home alone. Feel this is a good option as it appears that pt will not have consistent support (neighbors are available PRN). Pt agreeable to plan for SNF at this time.   Follow Up Recommendations  SNF;Supervision/Assistance - 24 hour     Equipment Recommendations  Rolling walker with 5" wheels    Recommendations for Other Services       Precautions / Restrictions Precautions Precautions: Sternal;Fall Precaution Comments: Was not able to recall sternal precautions without cueing.  Restrictions Weight Bearing Restrictions: Yes Other Position/Activity Restrictions: Sternal precautions    Mobility  Bed Mobility Overal bed mobility: Needs Assistance Bed Mobility: Rolling;Supine to Sit;Sit to Supine Rolling: Modified independent (Device/Increase time)   Supine to sit: Supervision Sit to supine: Supervision   General bed mobility comments: HOB flat, no rails. Pt required supervision and VC's for maintanence of sternal precautions. Increased cueing for no UE use when scooting or during transitions.   Transfers Overall transfer level: Needs assistance Equipment used: Rolling walker (2 wheeled) Transfers: Sit to/from Stand Sit to Stand: Min guard Stand pivot transfers: Modified independent (Device/Increase time)       General transfer comment: Utilized heart pillow to maintain  sternal precautions. VC's for no pushing/pulling on recliner to scoot to edge of chair. Pt required x2 attempted to stand from recliner, with steadying assist required at elbow for safety. Pt did not have difficulty standing from a raised bed level (equal to her bed height at home).   Ambulation/Gait Ambulation/Gait assistance: Modified independent (Device/Increase time) Ambulation Distance (Feet): 225 Feet Assistive device: Rolling walker (2 wheeled) Gait Pattern/deviations: Step-through pattern;Decreased stride length Gait velocity: Decreased Gait velocity interpretation: Below normal speed for age/gender General Gait Details: VC's for general safety awareness during gait training. Pt with minimal weight on UE's, using RW for balance oinly.    Stairs            Wheelchair Mobility    Modified Rankin (Stroke Patients Only)       Balance Overall balance assessment: Needs assistance Sitting-balance support: Feet supported;No upper extremity supported Sitting balance-Leahy Scale: Normal     Standing balance support: No upper extremity supported Standing balance-Leahy Scale: Fair Standing balance comment: Steadying assist only as pt transitioned to standing initially.                     Cognition Arousal/Alertness: Awake/alert Behavior During Therapy: WFL for tasks assessed/performed Overall Cognitive Status: Within Functional Limits for tasks assessed Area of Impairment: Memory     Memory: Decreased recall of precautions;Decreased short-term memory              Exercises      General Comments General comments (skin integrity, edema, etc.): Pt was educated on sternal precautions and answered questions about rehab process. Pt with increased need for cues this session to maintain precautions.  Pertinent Vitals/Pain Pain Assessment: No/denies pain    Home Living                      Prior Function            PT Goals (current goals  can now be found in the care plan section) Acute Rehab PT Goals PT Goal Formulation: With patient Time For Goal Achievement: 03/07/15 Potential to Achieve Goals: Good Progress towards PT goals: Progressing toward goals    Frequency  Min 3X/week    PT Plan Discharge plan needs to be updated    Co-evaluation             End of Session Equipment Utilized During Treatment: Gait belt Activity Tolerance: Patient tolerated treatment well Patient left: in chair;with call bell/phone within reach;with chair alarm set     Time: 5701-7793 PT Time Calculation (min) (ACUTE ONLY): 25 min  Charges:  $Gait Training: 8-22 mins $Therapeutic Activity: 8-22 mins                    G Codes:      Rolinda Roan 2015-03-13, 9:44 AM   Rolinda Roan, PT, DPT Acute Rehabilitation Services Pager: [phone]

## 2015-03-06 ENCOUNTER — Encounter: Payer: Self-pay | Admitting: Surgery

## 2015-03-07 ENCOUNTER — Encounter: Payer: Self-pay | Admitting: Cardiology

## 2015-03-07 DIAGNOSIS — Z48812 Encounter for surgical aftercare following surgery on the circulatory system: Secondary | ICD-10-CM | POA: Diagnosis not present

## 2015-03-08 ENCOUNTER — Encounter: Payer: Self-pay | Admitting: Cardiology

## 2015-03-08 ENCOUNTER — Ambulatory Visit: Payer: Self-pay | Admitting: *Deleted

## 2015-03-08 ENCOUNTER — Encounter: Payer: Self-pay | Admitting: Surgery

## 2015-03-08 DIAGNOSIS — Q231 Congenital insufficiency of aortic valve: Secondary | ICD-10-CM

## 2015-03-08 DIAGNOSIS — Z4802 Encounter for removal of sutures: Secondary | ICD-10-CM

## 2015-03-08 DIAGNOSIS — I350 Nonrheumatic aortic (valve) stenosis: Secondary | ICD-10-CM

## 2015-03-08 DIAGNOSIS — Z952 Presence of prosthetic heart valve: Secondary | ICD-10-CM

## 2015-03-08 NOTE — Progress Notes (Signed)
[name] returns after her AVR for suture removal of her previous two chest tube sites.  These were easily removed. She is not using any pain meds.  Her sternal incision as well as the chest tube sites all all very well healed. She relates no problems.  She will return as scheduled with a cxr.

## 2015-03-11 ENCOUNTER — Encounter: Payer: Self-pay | Admitting: Cardiology

## 2015-03-11 ENCOUNTER — Encounter: Payer: Self-pay | Admitting: Surgery

## 2015-03-12 ENCOUNTER — Encounter: Payer: Self-pay | Admitting: Cardiology

## 2015-03-12 ENCOUNTER — Encounter: Payer: Self-pay | Admitting: Surgery

## 2015-03-13 ENCOUNTER — Encounter: Payer: Self-pay | Admitting: Cardiology

## 2015-03-20 ENCOUNTER — Telehealth: Payer: Self-pay | Admitting: Emergency Medicine

## 2015-03-20 ENCOUNTER — Encounter: Payer: Self-pay | Admitting: Obstetrics & Gynecology

## 2015-03-20 NOTE — Telephone Encounter (Signed)
Chief Complaint  Patient presents with  . Advice Only    Patient sent mychart message with update for [name].     ===View-only below this line===   ----- Message -----    From: Nicole Hess    Sent: 03/20/2015  4:31 PM EDT      To: Lyman Speller, MD Subject: Non-Urgent Medical Question  FYI....  I had my aortic valve surgery on June 14.  And I am still at home and not driving.  I am not sure if the doctors keep you in the loop, but I wanted to.  My scar gets better every day.  Once it gets to a certain point, I have some cream which I will put on it.  Also, I walk every day....it is not the mile I was doing prior to the surgery, but more or less 1/2 - 3/4 mile daily.  Take care.  Nicole Hess

## 2015-03-20 NOTE — Telephone Encounter (Signed)
Responded to patient via mychart:  Ms. Salamon,   [name] is out of the office today. I will pass this message on her. I am sure she will be pleased at your progress and thank you for updating [place]. Please let us know if you need anything!    Routing to provider for final review. Patient agreeable to disposition. Will close encounter.   Patient aware provider will review message and nurse will return call if any additional advice or change of disposition.

## 2015-03-22 ENCOUNTER — Encounter: Payer: Self-pay | Admitting: Cardiology

## 2015-03-27 ENCOUNTER — Encounter: Payer: Self-pay | Admitting: Cardiology

## 2015-03-27 NOTE — Telephone Encounter (Signed)
Note written to pt.

## 2015-04-01 ENCOUNTER — Other Ambulatory Visit: Payer: Self-pay | Admitting: Surgery

## 2015-04-01 DIAGNOSIS — Q230 Congenital stenosis of aortic valve: Secondary | ICD-10-CM

## 2015-04-01 DIAGNOSIS — Q231 Congenital insufficiency of aortic valve: Principal | ICD-10-CM

## 2015-04-02 ENCOUNTER — Ambulatory Visit (INDEPENDENT_AMBULATORY_CARE_PROVIDER_SITE_OTHER): Payer: PPO | Admitting: Cardiology

## 2015-04-02 ENCOUNTER — Encounter: Payer: Self-pay | Admitting: Cardiology

## 2015-04-02 VITALS — BP 138/68 | HR 85 | Ht 68.0 in | Wt 142.0 lb

## 2015-04-02 DIAGNOSIS — Q231 Congenital insufficiency of aortic valve: Secondary | ICD-10-CM

## 2015-04-02 DIAGNOSIS — I350 Nonrheumatic aortic (valve) stenosis: Secondary | ICD-10-CM

## 2015-04-02 DIAGNOSIS — Q230 Congenital stenosis of aortic valve: Secondary | ICD-10-CM

## 2015-04-02 DIAGNOSIS — Z954 Presence of other heart-valve replacement: Secondary | ICD-10-CM

## 2015-04-02 DIAGNOSIS — Z952 Presence of prosthetic heart valve: Secondary | ICD-10-CM

## 2015-04-02 NOTE — Patient Instructions (Signed)
Stop taking midodrine.   Continue ASA and pravachol  Keep appointment for Echo in August  I will see you in 3 months.

## 2015-04-02 NOTE — Progress Notes (Signed)
[name] Date of Birth: [birth year] Medical Record #[id number]  History of Present Illness: Nicole Hess is seen today for post hospital follow up. She  has a history of bicuspid aortic valve with aortic stenosis. Evaluation in 2010 with echocardiogram showed a mean gradient of 14 mmHg consistent with mild aortic stenosis. Echo in May 2014 showed a mean gradient of 25 with moderate AS. In August 2015 Echo showed increase in valve mean gradient to 46 mm Hg with valve area of .97 cm2. Peak gradient was 69 mm Hg. This year she experienced symptoms of  fatigue.  Follow up Echo showed progression of aortic stenosis- now severe. No CAD. She underwent AVR with a # 21 mm Edwards Magna-Ease bioprosthetic valve by Dr. Cyndia Bent on 02/26/15. He post op course was complicated by labile BP and she was started on midodrine.  On follow up today she is doing well. No dizziness, chest pain, SOB. Incisions are healing well. She is walking regularly.  Current Outpatient Prescriptions on File Prior to Visit  Medication Sig Dispense Refill  . Potassium Chloride ER 20 MEQ TBCR Take 20 mEq by mouth daily. For 5 days then stop. 5 tablet 0  . pravastatin (PRAVACHOL) 80 MG tablet Take 80 mg by mouth daily.    . primidone (MYSOLINE) 250 MG tablet Take 250 mg by mouth 2 (two) times daily.    . temazepam (RESTORIL) 30 MG capsule Take 30 mg by mouth at bedtime as needed for sleep. For sleep    . Tretinoin Microsphere (RETIN-A MICRO EX) Apply 1 application topically daily.     . Ascorbic Acid (VITAMIN C) 1000 MG tablet Take 1,000 mg by mouth daily.    Marland Kitchen aspirin EC 325 MG EC tablet Take 1 tablet (325 mg total) by mouth daily. 30 tablet 0  . b complex vitamins tablet Take 1 tablet by mouth daily.    . Calcium Carbonate-Vitamin D (CALTRATE 600+D PO) Take 1 tablet by mouth 2 (two) times daily.    Marland Kitchen estradiol (ESTRACE) 0.5 MG tablet Take 1 tablet (0.5 mg total) by mouth daily. 90 tablet 1  . Estradiol (VAGIFEM) 10 MCG TABS vaginal  tablet INSERT 1 TABLET VAGINALLY 2 TIMES A WEEK 24 tablet 4  . Glucosamine-Chondroitin (OSTEO BI-FLEX REGULAR STRENGTH PO) Take 1 tablet by mouth 2 (two) times daily.     . Multiple Vitamin (MULITIVITAMIN WITH MINERALS) TABS Take 1 tablet by mouth daily.    . Omega-3 Fatty Acids (OMEGA-3 FISH OIL PO) Take 1 capsule by mouth 2 (two) times daily.     . vitamin B-12 (CYANOCOBALAMIN) 50 MCG tablet Take 50 mcg by mouth daily.     No current facility-administered medications on file prior to visit.    Allergies  Allergen Reactions  . Penicillins Rash    Past Medical History  Diagnosis Date  . Seizures     as a child  . Bicuspid aortic valve   . Aortic stenosis   . Leukopenia     Seen by Dr. Lamonte Sakai, present since 2008, watchful waiting  . Gout   . Osteopenia   . Abnormal Pap smear   . Anxiety   . Heart murmur   . Heart valve problem     will have follow up Echo-Dr Ludwika Rodd-will have replacement in a couple of years  . Coronary artery disease   . Shortness of breath dyspnea   . Depression   . Anemia     Past Surgical History  Procedure Laterality  Date  . Oophorectomy    . Partial hysterectomy    . Gum surgery    . Tubal ligation    . Cervical cone biopsy    . Vaginal hysterectomy      for dysplasia  . Colonoscopy  2010  . Doppler echocardiography  3/14  . Cardiac catheterization N/A 01/29/2015    Procedure: Right/Left Heart Cath and Coronary Angiography;  Surgeon: Latarshia Jersey M Martinique, MD;  Location: [facility];  Service: Cardiovascular;  Laterality: N/A;  . Aortic valve replacement N/A 02/26/2015    Procedure: AORTIC VALVE REPLACEMENT (AVR);  Surgeon: Gaye Pollack, MD;  Location: [facility];  Service: Open Heart Surgery;  Laterality: N/A;  . Tee without cardioversion N/A 02/26/2015    Procedure: TRANSESOPHAGEAL ECHOCARDIOGRAM (TEE);  Surgeon: Gaye Pollack, MD;  Location: [facility];  Service: Open Heart Surgery;  Laterality: N/A;    History  Smoking status  . Never Smoker     Smokeless tobacco  . Never Used    History  Alcohol Use  . Yes    Comment: 2 glasses of wine/cranberry juice a day    Family History  Problem Relation Age of Onset  . Multiple myeloma Mother   . Stroke Father   . Hypertension Father   . Heart disease Father 58  . Kidney disease Father   . Cancer Father   . Cancer Maternal Grandmother     renal    Review of Systems: The review of systems is positive for chronic anxiety.  All other systems were reviewed and are negative.  Physical Exam: BP 138/68 mmHg  Pulse 85  Ht 5' 8"  (6.222 m)  Wt 64.411 kg (142 lb)  BMI 21.60 kg/m2  LMP 09/15/1987 She is a pleasant white female in no acute distress. HEENT: Normocephalic, atraumatic. Pupils equal round and reactive to light and accommodation. Extraocular movements are full. Oropharynx is clear. Neck: No JVD, adenopathy, thyromegaly, or bruits. Lungs: Clear Cardiovascular: Regular rate and rhythm. Normal S1 and S2. Grade 2/6 systolic flow murmur heard best at right upper sternal border radiating to the carotids and apex. Abdomen: Soft and nontender. No masses or bruits. Bowel sounds positive. Extremities: No cyanosis or edema. Pulses are 2+ and symmetric. Skin: Warm and dry Neuro: Alert and oriented x3. Cranial nerves II through XII are intact. Is appropriate.  LABORATORY DATA: Lab Results  Component Value Date   WBC 11.5* 02/28/2015   HGB 9.2* 02/28/2015   HCT 27.2* 02/28/2015   PLT 113* 02/28/2015   GLUCOSE 99 03/02/2015   CHOL 200 01/15/2015   TRIG 81 01/15/2015   HDL 84 01/15/2015   LDLCALC 100* 01/15/2015   ALT 41 02/22/2015   AST 57* 02/22/2015   NA 138 03/02/2015   K 3.9 03/02/2015   CL 104 03/02/2015   CREATININE 0.65 03/02/2015   BUN 12 03/02/2015   CO2 28 03/02/2015   TSH 1.560 01/15/2015   INR 1.50* 02/26/2015   HGBA1C 5.5 02/22/2015    Ecg today shows NSR with rate 85. Nonspecific T wave abnormality. I have personally reviewed and interpreted this  study.  Assessment / Plan: 1. Severe aortic stenosis with bicuspid aortic valve. S/p AVR. Good clinical recovery. Normal BP. Will continue ASA and pravastatin. Can stop midodrine now. Follow up Echo scheduled in August. I will follow up in 3 months.

## 2015-04-03 ENCOUNTER — Ambulatory Visit
Admission: RE | Admit: 2015-04-03 | Discharge: 2015-04-03 | Disposition: A | Discharge status: Home / Self Care | Payer: PPO | Source: Ambulatory Visit | Attending: Surgery | Admitting: Surgery

## 2015-04-03 ENCOUNTER — Encounter: Payer: Self-pay | Admitting: Surgery

## 2015-04-03 ENCOUNTER — Ambulatory Visit (INDEPENDENT_AMBULATORY_CARE_PROVIDER_SITE_OTHER): Payer: Self-pay | Admitting: Surgery

## 2015-04-03 VITALS — BP 135/81 | HR 76 | Resp 16 | Ht 68.0 in | Wt 142.0 lb

## 2015-04-03 DIAGNOSIS — Q230 Congenital stenosis of aortic valve: Secondary | ICD-10-CM

## 2015-04-03 DIAGNOSIS — Z952 Presence of prosthetic heart valve: Secondary | ICD-10-CM

## 2015-04-03 DIAGNOSIS — Q231 Congenital insufficiency of aortic valve: Secondary | ICD-10-CM

## 2015-04-03 DIAGNOSIS — Z954 Presence of other heart-valve replacement: Secondary | ICD-10-CM

## 2015-04-03 DIAGNOSIS — I350 Nonrheumatic aortic (valve) stenosis: Secondary | ICD-10-CM

## 2015-04-04 ENCOUNTER — Encounter: Payer: Self-pay | Admitting: Cardiology

## 2015-04-05 ENCOUNTER — Encounter: Payer: Self-pay | Admitting: Surgery

## 2015-04-05 NOTE — Progress Notes (Signed)
HPI: Patient returns for routine postoperative follow-up having undergone AVR with a 21 mm pericardial valve on 02/26/2015. The patient's early postoperative recovery while in the hospital was notable for an uncomplicated postop course. Since hospital discharge the patient reports that she has continued to progress and is walking at least 30 minutes per day outside without chest pain or shortness of breath. Her stamina is continuing to improve.   Current Outpatient Prescriptions  Medication Sig Dispense Refill  . Ascorbic Acid (VITAMIN C) 1000 MG tablet Take 1,000 mg by mouth daily.    Marland Kitchen aspirin EC 325 MG EC tablet Take 1 tablet (325 mg total) by mouth daily. 30 tablet 0  . b complex vitamins tablet Take 1 tablet by mouth daily.    . Calcium Carbonate-Vitamin D (CALTRATE 600+D PO) Take 1 tablet by mouth 2 (two) times daily.    . Estradiol (VAGIFEM) 10 MCG TABS vaginal tablet INSERT 1 TABLET VAGINALLY 2 TIMES A WEEK 24 tablet 4  . Glucosamine-Chondroitin (OSTEO BI-FLEX REGULAR STRENGTH PO) Take 1 tablet by mouth 2 (two) times daily.     . Multiple Vitamin (MULITIVITAMIN WITH MINERALS) TABS Take 1 tablet by mouth daily.    . Omega-3 Fatty Acids (OMEGA-3 FISH OIL PO) Take 1 capsule by mouth 2 (two) times daily.     . Potassium Chloride ER 20 MEQ TBCR Take 20 mEq by mouth daily. For 5 days then stop. 5 tablet 0  . pravastatin (PRAVACHOL) 80 MG tablet Take 80 mg by mouth daily.    . primidone (MYSOLINE) 250 MG tablet Take 250 mg by mouth 2 (two) times daily.    . temazepam (RESTORIL) 30 MG capsule Take 30 mg by mouth at bedtime as needed for sleep. For sleep    . Tretinoin Microsphere (RETIN-A MICRO EX) Apply 1 application topically daily.     . vitamin B-12 (CYANOCOBALAMIN) 50 MCG tablet Take 50 mcg by mouth daily.     No current facility-administered medications for this visit.    Physical Exam: BP 135/81 mmHg  Pulse 76  Resp 16  Ht 5\' 8"  (1.727 m)  Wt 142 lb (64.411 kg)  BMI  21.60 kg/m2  SpO2 99%  LMP 09/15/1987 She looks well. Lung exam is clear. Cardiac exam shows a regular rate and rhythm with normal heart sounds. There is a 1/6 systolic flow murmur across the aortic valve prosthesis. Chest incision is healing well and sternum is stable. There is no peripheral edema.  Diagnostic Tests:  CLINICAL DATA: History of aortic stenosis and bicuspid aortic valve status post valve replacement on June 14th 2016, currently asymptomatic.  EXAM: CHEST 2 VIEW  COMPARISON: PA and lateral chest x-ray of March 03, 2015  FINDINGS: The lungs are chronically mildly hyperinflated. The pleural effusions have resolved. There is no interstitial edema. The heart and pulmonary vascularity are normal. There are 7 intact sternal wires. The prosthetic aortic valve is visible. The bony thorax is unremarkable.  IMPRESSION: There is no active cardiopulmonary disease. Interval clearing of small pleural effusions and mild interstitial edema. Stable mild chronic hyperinflation.   Electronically Signed  By: David Martinique M.D.  On: 04/03/2015 08:16   Impression:  Overall I think she is doing well. I encouraged her to continue walking.  I told her she could drive her car but should not lift anything heavier than 10 lbs for three months postop.   Plan:  She is scheduled for an echo in August and will follow up  with Dr. Martinique in 3 months.   Gaye Pollack, MD [facility] [phone]

## 2015-04-07 ENCOUNTER — Telehealth: Payer: Self-pay | Admitting: Physician Assistant

## 2015-04-07 ENCOUNTER — Encounter: Payer: Self-pay | Admitting: Cardiology

## 2015-04-07 ENCOUNTER — Encounter: Payer: Self-pay | Admitting: Surgery

## 2015-04-07 NOTE — Telephone Encounter (Signed)
Nicole Hess is a 69 y.o. female who is status post recent AVR.  She had a small area develop around her incision. This area opened up today and seems to have resolved.  No fevers.  She has already spoken with Dr. Cyndia Bent. I advised her to seek earlier FU with Dr. Cyndia Bent if she has increased drainage, redness, fever. She agrees with this plan. Richardson Dopp, PA-C   04/07/2015 1:59 PM

## 2015-04-09 ENCOUNTER — Encounter: Payer: Self-pay | Admitting: *Deleted

## 2015-04-09 DIAGNOSIS — T814XXA Infection following a procedure, initial encounter: Principal | ICD-10-CM

## 2015-04-09 DIAGNOSIS — IMO0001 Reserved for inherently not codable concepts without codable children: Secondary | ICD-10-CM

## 2015-04-09 NOTE — Progress Notes (Signed)
Patient ID: Nicole Hess, female   DOB: [birth year], 69 y.o.   MRN: [id number] Ms.Areola came to the office on Monday morning 04/08/15 with complaints of an area at the top of her sternal incision. On exam there was a white area that she said had drained when she had touched it with a tissue. I used a Q-tip and opened the area, then removed a loose suture.  The area was cleansed with peroxide, saline and covered with polysporin and a bandaide for today.  She will call if she has any further problems.

## 2015-04-18 ENCOUNTER — Encounter: Payer: Self-pay | Admitting: Cardiology

## 2015-04-24 ENCOUNTER — Ambulatory Visit (HOSPITAL_COMMUNITY): Payer: PPO | Attending: Cardiology

## 2015-04-24 ENCOUNTER — Other Ambulatory Visit: Payer: Self-pay

## 2015-04-24 ENCOUNTER — Ambulatory Visit (HOSPITAL_COMMUNITY): Payer: PPO

## 2015-04-24 DIAGNOSIS — Z952 Presence of prosthetic heart valve: Secondary | ICD-10-CM | POA: Insufficient documentation

## 2015-04-24 DIAGNOSIS — I517 Cardiomegaly: Secondary | ICD-10-CM | POA: Diagnosis not present

## 2015-04-24 DIAGNOSIS — Q231 Congenital insufficiency of aortic valve: Secondary | ICD-10-CM | POA: Diagnosis not present

## 2015-04-24 DIAGNOSIS — I350 Nonrheumatic aortic (valve) stenosis: Secondary | ICD-10-CM | POA: Diagnosis not present

## 2015-04-24 DIAGNOSIS — I340 Nonrheumatic mitral (valve) insufficiency: Secondary | ICD-10-CM | POA: Diagnosis not present

## 2015-05-06 ENCOUNTER — Encounter: Payer: Self-pay | Admitting: Cardiology

## 2015-05-12 ENCOUNTER — Encounter: Payer: Self-pay | Admitting: Cardiology

## 2015-05-12 ENCOUNTER — Encounter: Payer: Self-pay | Admitting: Surgery

## 2015-05-20 ENCOUNTER — Encounter: Payer: Self-pay | Admitting: Cardiology

## 2015-05-20 ENCOUNTER — Encounter: Payer: Self-pay | Admitting: Surgery

## 2015-06-17 ENCOUNTER — Ambulatory Visit (INDEPENDENT_AMBULATORY_CARE_PROVIDER_SITE_OTHER): Payer: PPO | Admitting: Cardiology

## 2015-06-17 ENCOUNTER — Encounter: Payer: Self-pay | Admitting: Cardiology

## 2015-06-17 VITALS — BP 104/64 | HR 68 | Ht 68.0 in | Wt 144.5 lb

## 2015-06-17 DIAGNOSIS — Q231 Congenital insufficiency of aortic valve: Secondary | ICD-10-CM

## 2015-06-17 DIAGNOSIS — I350 Nonrheumatic aortic (valve) stenosis: Secondary | ICD-10-CM

## 2015-06-17 DIAGNOSIS — Z952 Presence of prosthetic heart valve: Secondary | ICD-10-CM

## 2015-06-17 DIAGNOSIS — Z954 Presence of other heart-valve replacement: Secondary | ICD-10-CM

## 2015-06-17 DIAGNOSIS — Q230 Congenital stenosis of aortic valve: Secondary | ICD-10-CM

## 2015-06-17 NOTE — Patient Instructions (Signed)
Continue your current therapy  I will see you in 6 months.   

## 2015-06-17 NOTE — Progress Notes (Signed)
[name] Date of Birth: December 11, 1945 Medical Record #[id number]  History of Present Illness: Anadia is seen today for follow up s/p AVR. She  has a history of bicuspid aortic valve with aortic stenosis. Evaluation in 2010 with echocardiogram showed a mean gradient of 14 mmHg consistent with mild aortic stenosis. Echo in May 2014 showed a mean gradient of 25 with moderate AS. In August 2015 Echo showed increase in valve mean gradient to 46 mm Hg with valve area of .97 cm2. Peak gradient was 69 mm Hg. This year she experienced symptoms of  fatigue.  Follow up Echo showed progression of aortic stenosis- now severe. No CAD. She underwent AVR with a # 21 mm Edwards Magna-Ease bioprosthetic valve by Dr. Cyndia Bent on 02/26/15. He post op course was complicated by labile BP and she was started on midodrine.  On follow up today she is doing well. No dizziness, chest pain, SOB. Incisions are healing well. She is walking regularly.   Current Outpatient Prescriptions on File Prior to Visit  Medication Sig Dispense Refill  . Ascorbic Acid (VITAMIN C) 1000 MG tablet Take 1,000 mg by mouth daily.    Marland Kitchen aspirin EC 325 MG EC tablet Take 1 tablet (325 mg total) by mouth daily. 30 tablet 0  . b complex vitamins tablet Take 1 tablet by mouth daily.    . Calcium Carbonate-Vitamin D (CALTRATE 600+D PO) Take 1 tablet by mouth 2 (two) times daily.    . Glucosamine-Chondroitin (OSTEO BI-FLEX REGULAR STRENGTH PO) Take 1 tablet by mouth 2 (two) times daily.     . Multiple Vitamin (MULITIVITAMIN WITH MINERALS) TABS Take 1 tablet by mouth daily.    . Omega-3 Fatty Acids (OMEGA-3 FISH OIL PO) Take 1 capsule by mouth 2 (two) times daily.     . Potassium Chloride ER 20 MEQ TBCR Take 20 mEq by mouth daily. For 5 days then stop. 5 tablet 0  . pravastatin (PRAVACHOL) 80 MG tablet Take 80 mg by mouth daily.    . primidone (MYSOLINE) 250 MG tablet Take 250 mg by mouth 2 (two) times daily.    . temazepam (RESTORIL) 30 MG capsule  Take 30 mg by mouth at bedtime as needed for sleep. For sleep    . Tretinoin Microsphere (RETIN-A MICRO EX) Apply 1 application topically daily.     . vitamin B-12 (CYANOCOBALAMIN) 50 MCG tablet Take 50 mcg by mouth daily.     No current facility-administered medications on file prior to visit.    Allergies  Allergen Reactions  . Penicillins Rash    Past Medical History  Diagnosis Date  . Seizures ([place])     as a child  . Bicuspid aortic valve   . Aortic stenosis   . Leukopenia     Seen by Dr. Lamonte Sakai, present since 2008, watchful waiting  . Gout   . Osteopenia   . Abnormal Pap smear   . Anxiety   . Heart murmur   . Heart valve problem     will have follow up Echo-Dr Jordan-will have replacement in a couple of years  . Coronary artery disease   . Shortness of breath dyspnea   . Depression   . Anemia     Past Surgical History  Procedure Laterality Date  . Oophorectomy    . Partial hysterectomy    . Gum surgery    . Tubal ligation    . Cervical cone biopsy    . Vaginal hysterectomy  for dysplasia  . Colonoscopy  2010  . Doppler echocardiography  3/14  . Cardiac catheterization N/A 01/29/2015    Procedure: Right/Left Heart Cath and Coronary Angiography;  Surgeon: Peter M Martinique, MD;  Location: [facility];  Service: Cardiovascular;  Laterality: N/A;  . Aortic valve replacement N/A 02/26/2015    Procedure: AORTIC VALVE REPLACEMENT (AVR);  Surgeon: Gaye Pollack, MD;  Location: [facility];  Service: Open Heart Surgery;  Laterality: N/A;  . Tee without cardioversion N/A 02/26/2015    Procedure: TRANSESOPHAGEAL ECHOCARDIOGRAM (TEE);  Surgeon: Gaye Pollack, MD;  Location: [facility];  Service: Open Heart Surgery;  Laterality: N/A;    History  Smoking status  . Never Smoker   Smokeless tobacco  . Never Used    History  Alcohol Use  . Yes    Comment: 2 glasses of wine/cranberry juice a day    Family History  Problem Relation Age of Onset  . Multiple myeloma Mother    . Stroke Father   . Hypertension Father   . Heart disease Father 45  . Kidney disease Father   . Cancer Father   . Cancer Maternal Grandmother     renal    Review of Systems: The review of systems is positive for chronic anxiety and insomnia.  All other systems were reviewed and are negative.  Physical Exam: BP 104/64 mmHg  Pulse 68  Ht 5' 8" (1.727 m)  Wt 65.545 kg (144 lb 8 oz)  BMI 21.98 kg/m2  LMP 09/15/1987 She is a pleasant white female in no acute distress. HEENT: Normocephalic, atraumatic. Pupils equal round and reactive to light and accommodation. Extraocular movements are full. Oropharynx is clear. Neck: No JVD, adenopathy, thyromegaly, or bruits. Lungs: Clear Cardiovascular: Regular rate and rhythm. Normal S1 and S2. Grade 2/6 systolic flow murmur heard best at right upper sternal border radiating to the carotids and apex. Abdomen: Soft and nontender. No masses or bruits. Bowel sounds positive. Extremities: No cyanosis or edema. Pulses are 2+ and symmetric. Skin: Warm and dry Neuro: Alert and oriented x3. Cranial nerves II through XII are intact. Is appropriate.  LABORATORY DATA: Lab Results  Component Value Date   WBC 11.5* 02/28/2015   HGB 9.2* 02/28/2015   HCT 27.2* 02/28/2015   PLT 113* 02/28/2015   GLUCOSE 99 03/02/2015   CHOL 200 01/15/2015   TRIG 81 01/15/2015   HDL 84 01/15/2015   LDLCALC 100* 01/15/2015   ALT 41 02/22/2015   AST 57* 02/22/2015   NA 138 03/02/2015   K 3.9 03/02/2015   CL 104 03/02/2015   CREATININE 0.65 03/02/2015   BUN 12 03/02/2015   CO2 28 03/02/2015   TSH 1.560 01/15/2015   INR 1.50* 02/26/2015   HGBA1C 5.5 02/22/2015    Echo: Study Conclusions  - Left ventricle: The cavity size was normal. Systolic function was normal. The estimated ejection fraction was in the range of 60% to 65%. Wall motion was normal; there were no regional wall motion abnormalities. - Aortic valve: A bioprosthesis was present and  functioning normally. Peak velocity (S): 250 cm/s. Mean gradient (S): 14 mm Hg. Valve area (VTI): 1.26 cm^2. Valve area (Vmax): 1.23 cm^2. Valve area (Vmean): 1.21 cm^2. - Mitral valve: Calcified annulus. Mildly thickened leaflets . There was mild regurgitation. - Right ventricle: The cavity size was mildly dilated. Wall thickness was normal.  Assessment / Plan: 1. Severe aortic stenosis with bicuspid aortic valve. S/p AVR. Good clinical recovery.  Will  continue ASA and pravastatin. Follow up Echo was satisfactory. I will follow up in 6 months. Discussed need for SBE prophylaxis.

## 2015-06-18 ENCOUNTER — Encounter: Payer: Self-pay | Admitting: Cardiology

## 2015-06-19 ENCOUNTER — Telehealth: Payer: Self-pay | Admitting: Cardiology

## 2015-06-19 NOTE — Telephone Encounter (Signed)
Returned call to patient Dr.Jordan answered email he advised ok to take [facility] antihistamine with no decongestant.Advised ok to take caltrate with vit d.

## 2015-06-19 NOTE — Telephone Encounter (Signed)
Pt is calling in stating that she has emailed the doctor a couple of times in regards to some medications she is currently taking. Please call  Thanks

## 2015-06-21 ENCOUNTER — Encounter: Payer: Self-pay | Admitting: Surgery

## 2015-06-21 ENCOUNTER — Encounter: Payer: Self-pay | Admitting: Cardiology

## 2015-06-23 ENCOUNTER — Encounter: Payer: Self-pay | Admitting: Cardiology

## 2015-06-24 ENCOUNTER — Encounter: Payer: Self-pay | Admitting: Cardiology

## 2015-06-25 ENCOUNTER — Encounter: Payer: Self-pay | Admitting: Cardiology

## 2015-07-09 NOTE — Telephone Encounter (Signed)
Spoke to patient.She stated she was doing good,not having any problems.Stated her neighbor discouraged her.Patient reassured.

## 2015-07-12 ENCOUNTER — Encounter: Payer: Self-pay | Admitting: Cardiology

## 2015-08-05 ENCOUNTER — Encounter: Payer: Self-pay | Admitting: Cardiology

## 2015-08-06 ENCOUNTER — Encounter: Payer: Self-pay | Admitting: Surgery

## 2015-08-06 ENCOUNTER — Encounter: Payer: Self-pay | Admitting: Cardiology

## 2015-08-13 ENCOUNTER — Encounter: Payer: Self-pay | Admitting: Cardiology

## 2015-08-14 ENCOUNTER — Encounter: Payer: Self-pay | Admitting: Obstetrics & Gynecology

## 2015-08-14 ENCOUNTER — Encounter: Payer: Self-pay | Admitting: Cardiology

## 2015-08-15 ENCOUNTER — Encounter: Payer: Self-pay | Admitting: Obstetrics & Gynecology

## 2015-08-15 ENCOUNTER — Ambulatory Visit (INDEPENDENT_AMBULATORY_CARE_PROVIDER_SITE_OTHER): Payer: PPO | Admitting: Obstetrics & Gynecology

## 2015-08-15 ENCOUNTER — Telehealth: Payer: Self-pay | Admitting: Obstetrics & Gynecology

## 2015-08-15 VITALS — BP 107/74 | HR 70 | Resp 16 | Ht 67.75 in | Wt 144.0 lb

## 2015-08-15 DIAGNOSIS — N950 Postmenopausal bleeding: Secondary | ICD-10-CM

## 2015-08-15 DIAGNOSIS — R319 Hematuria, unspecified: Secondary | ICD-10-CM

## 2015-08-15 LAB — POCT URINALYSIS DIPSTICK
Bilirubin, UA: NEGATIVE
Glucose, UA: NEGATIVE
Ketones, UA: NEGATIVE
Leukocytes, UA: NEGATIVE
NITRITE UA: NEGATIVE
PH UA: 5
PROTEIN UA: NEGATIVE
RBC UA: NEGATIVE
UROBILINOGEN UA: NEGATIVE

## 2015-08-15 NOTE — Telephone Encounter (Signed)
Spoke with patient. Patient states that last night she went to the restroom and noticed blood in her panties. Patient denies any further bleeding. Denies any urinary symptoms, fever, lower back pain, or pelvic discomfort. Advised will need to be seen in office for further evaluation. Patient is agreeable and verbalizes understanding. Appointment scheduled for today 08/15/2015 at 2 pm with Dr.Miller. Agreeable to date and time.  Routing to provider for final review. Patient agreeable to disposition. Will close encounter.

## 2015-08-15 NOTE — Telephone Encounter (Signed)
Please see telephone encounter. Patient is scheduled for an appointment today 08/15/2015 at 2 pm with Dr.Miller.   Routing to provider for final review. Patient agreeable to disposition. Will close encounter.

## 2015-08-15 NOTE — Telephone Encounter (Signed)
Patient said she came home last night and went to the restroom and noticed some blood in her panties. Patient wants [name] to be aware. Best # to reach: [phone] (Chart outside tracy's door)

## 2015-08-15 NOTE — Progress Notes (Signed)
Subjective:     Patient ID: Nicole Hess, female   DOB: [birth year], 69 y.o.   MRN: [id number]  HPI 69 you G0 SWF here for complaint of vaginal bleeding that she noted when she wiped last night.  She saw some pink in her urine at this time as well but she's not sure the source.  Denies pain, dysuria, back pain, fever.  She's had a hysterectomy and had a remote hx of dysplasia.  Had normal Pap 8/14.  Colonoscopy is up to day was last one 2015 with Dr. Earlean Shawl.  Really just anxious.    Had aortic valve replacement with Dr. Cyndia Bent June 14th.  Has done very well.  Had questions about her scar.  Reports she is still anxious about driving any distances.  Is ok around town.  Was invited to [place] to see family for Thanksgiving but was too anxious to drive on [place].  Doesn't want anything for treatment just felt like it needed to be said.  Advised pt to call back if she feels otherwise in the future.  Review of Systems  All other systems reviewed and are negative.      Objective:   Physical Exam  Constitutional: She appears well-developed and well-nourished.  Abdominal: Soft. Bowel sounds are normal. She exhibits no distension. There is no tenderness. There is no rebound and no guarding.  Genitourinary: Vagina normal. There is no rash, tenderness, lesion or injury on the right labia. There is no rash, tenderness, lesion or injury on the left labia. Right adnexum displays no mass and no tenderness. Left adnexum displays no mass and no tenderness.  Uterus and cervix surgically absent.  No vaginal lesions noted.  No evidence of bleeding in vaginal or on vulva.  No peri-rectal lesions.  Lymphadenopathy:       Right: No inguinal adenopathy present.       Left: No inguinal adenopathy present.  Neurological: She is alert.  Skin: Skin is warm and dry.  Psychiatric: She has a normal mood and affect.       Assessment:      PMP bleeding vs hematuria for one episode in pt with hx of  hysterectomy and remote hx of dysplasia No source of bleeding identified today Recent aortic valve replacement due to aortic stenosis  H/O Depression and increased anxiety since her surgery, declines treatment today    Plan:     Pt will continue to monitor and call if this happens again.  As there is no source of vaginal bleeding, would consider urology refill if occurs again. Urine micro and culture pending.

## 2015-08-16 ENCOUNTER — Encounter: Payer: Self-pay | Admitting: Obstetrics & Gynecology

## 2015-08-16 ENCOUNTER — Other Ambulatory Visit: Payer: Self-pay | Admitting: Obstetrics & Gynecology

## 2015-08-16 LAB — URINALYSIS, MICROSCOPIC ONLY
BACTERIA UA: NONE SEEN [HPF]
CRYSTALS: NONE SEEN [HPF]
Casts: NONE SEEN [LPF]
RBC / HPF: NONE SEEN RBC/HPF (ref <=2)
SQUAMOUS EPITHELIAL / LPF: NONE SEEN [HPF] (ref <=5)
Yeast: NONE SEEN [HPF]

## 2015-08-16 LAB — URINE CULTURE
Colony Count: NO GROWTH
Organism ID, Bacteria: NO GROWTH

## 2015-08-16 NOTE — Telephone Encounter (Signed)
Patient states she has been off of Rx since February. She thought that was the reason she started bleeding again and that's why she wanted to restart taking Rx.  Advise pt to not restart on Rx because [name] really wants pt to not be on it. Advised pt to monitor bleeding and if it happens again [name] will put in a referral to urology meaning that [name] thinks the source of blood may be from urine and not vaginal.   Pt verbalized understanding. Will not restart on estrace and will monitor bleeding.  Called pharmacy and canceled Rx.  Dr. Lestine Box Encounter closed.

## 2015-08-16 NOTE — Telephone Encounter (Signed)
Pt was here yesterday and I didn't talk with her about her HRT but I really think she should try and wean off of this.  She had an aortic valve replacement this summer.  I would like her to decrease to 1/2 tab of the 0.5mg  for a couple of months and then take every other day and then stop.  Ok to come back if has additional questions.

## 2015-08-16 NOTE — Addendum Note (Signed)
Addended by: Elroy Channel on: 08/16/2015 02:04 PM   Modules accepted: Orders, Medications

## 2015-08-16 NOTE — Telephone Encounter (Signed)
Medication refill request: Estrace  Last AEX:  07/06/14 SM Next AEX: 09/17/15 SM Last MMG (if hormonal medication request): 05/03/14 [id number]:neg Refill authorized: please advise.

## 2015-08-20 ENCOUNTER — Encounter: Payer: Self-pay | Admitting: Cardiology

## 2015-08-21 ENCOUNTER — Encounter: Payer: Self-pay | Admitting: Cardiology

## 2015-08-21 ENCOUNTER — Other Ambulatory Visit: Payer: Self-pay

## 2015-08-22 ENCOUNTER — Other Ambulatory Visit: Payer: Self-pay

## 2015-08-28 ENCOUNTER — Encounter: Payer: Self-pay | Admitting: Cardiology

## 2015-08-29 ENCOUNTER — Encounter: Payer: Self-pay | Admitting: Cardiology

## 2015-08-29 ENCOUNTER — Other Ambulatory Visit: Payer: Self-pay

## 2015-08-29 MED ORDER — AMOXICILLIN 500 MG PO CAPS: ORAL_CAPSULE | ORAL | Status: DC

## 2015-08-30 ENCOUNTER — Encounter: Payer: Self-pay | Admitting: Cardiology

## 2015-09-03 ENCOUNTER — Encounter: Payer: Self-pay | Admitting: Obstetrics & Gynecology

## 2015-09-15 ENCOUNTER — Encounter: Payer: Self-pay | Admitting: Cardiology

## 2015-09-17 ENCOUNTER — Ambulatory Visit: Payer: PPO | Admitting: Obstetrics & Gynecology

## 2015-09-17 ENCOUNTER — Encounter: Payer: Self-pay | Admitting: Obstetrics & Gynecology

## 2015-09-17 ENCOUNTER — Ambulatory Visit (INDEPENDENT_AMBULATORY_CARE_PROVIDER_SITE_OTHER): Payer: PPO | Admitting: Obstetrics & Gynecology

## 2015-09-17 VITALS — BP 116/60 | HR 74 | Resp 16 | Ht 67.75 in | Wt 149.0 lb

## 2015-09-17 DIAGNOSIS — Z01419 Encounter for gynecological examination (general) (routine) without abnormal findings: Secondary | ICD-10-CM | POA: Diagnosis not present

## 2015-09-17 DIAGNOSIS — Z124 Encounter for screening for malignant neoplasm of cervix: Secondary | ICD-10-CM | POA: Diagnosis not present

## 2015-09-17 NOTE — Addendum Note (Signed)
Addended by: Megan Salon on: 09/17/2015 11:07 AM   Modules accepted: Miquel Dunn

## 2015-09-17 NOTE — Progress Notes (Addendum)
70 y.o. G0P0000 SingleCaucasianF here for annual exam.  Pt reports she had some issues with sinusitis over the weekend but it is improved.  She's not had any further bleeding since January 1st.    Denies vaginal bleeding.    Patient's last menstrual period was 09/15/1987.          Sexually active: No.  The current method of family planning is status post hysterectomy.    Exercising: Yes.    Walking, leg lift Smoker:  no  Health Maintenance: Pap:  04/20/13 Neg History of abnormal Pap:  yes MMG:  05/03/14 BIRADS1:neg Colonoscopy:  2015 Dr. Medoff  BMD:   12/2010 TDaP:  2008 Screening Labs: PCP, Hb today: PCP, Urine today: PCP   reports that she has never smoked. She has never used smokeless tobacco. She reports that she drinks alcohol. She reports that she does not use illicit drugs.  Past Medical History  Diagnosis Date  . Seizures ([facility])     as a child  . Bicuspid aortic valve   . Aortic stenosis   . Leukopenia     Seen by Dr. Ha, present since 2008, watchful waiting  . Gout   . Osteopenia   . Abnormal Pap smear   . Anxiety   . Heart murmur   . Heart valve problem     will have follow up Echo-Dr Jordan-will have replacement in a couple of years  . Coronary artery disease   . Shortness of breath dyspnea   . Depression   . Anemia     Past Surgical History  Procedure Laterality Date  . Oophorectomy    . Partial hysterectomy    . Gum surgery    . Tubal ligation    . Cervical cone biopsy    . Vaginal hysterectomy      for dysplasia  . Colonoscopy  2010  . Doppler echocardiography  3/14  . Cardiac catheterization N/A 01/29/2015    Procedure: Right/Left Heart Cath and Coronary Angiography;  Surgeon: Peter M Jordan, MD;  Location: [facility];  Service: Cardiovascular;  Laterality: N/A;  . Aortic valve replacement N/A 02/26/2015    Procedure: AORTIC VALVE REPLACEMENT (AVR);  Surgeon: Bryan K Bartle, MD;  Location: [facility];  Service: Open Heart Surgery;  Laterality: N/A;   . Tee without cardioversion N/A 02/26/2015    Procedure: TRANSESOPHAGEAL ECHOCARDIOGRAM (TEE);  Surgeon: Bryan K Bartle, MD;  Location: [facility];  Service: Open Heart Surgery;  Laterality: N/A;    Current Outpatient Prescriptions  Medication Sig Dispense Refill  . amoxicillin (AMOXIL) 500 MG capsule Take 2 Gms (4 capsules ) 1 hour before dental work 4 capsule 2  . Ascorbic Acid (VITAMIN C) 1000 MG tablet Take 1,000 mg by mouth daily.    . aspirin EC 325 MG EC tablet Take 1 tablet (325 mg total) by mouth daily. 30 tablet 0  . b complex vitamins tablet Take 1 tablet by mouth daily.    . Calcium Carbonate-Vitamin D (CALTRATE 600+D PO) Take 1 tablet by mouth 2 (two) times daily.    . loteprednol (LOTEMAX) 0.5 % ophthalmic suspension Place into both eyes 4 (four) times daily. 5 mL 0  . Loteprednol Etabonate (LOTEMAX OP) Apply to eye.    . Omega-3 Fatty Acids (OMEGA-3 FISH OIL PO) Take 1 capsule by mouth 2 (two) times daily.     . Potassium Chloride ER 20 MEQ TBCR Take 20 mEq by mouth daily. For 5 days then stop. 5   tablet 0  . pravastatin (PRAVACHOL) 80 MG tablet Take 80 mg by mouth daily.    . primidone (MYSOLINE) 250 MG tablet Take 250 mg by mouth 2 (two) times daily.    . Tretinoin Microsphere (RETIN-A MICRO EX) Apply 1 application topically daily.     Marland Kitchen gatifloxacin (ZYMAXID) 0.5 % SOLN 1 to 2 drops in both eyes 4 times a day 1 Bottle 0  . vitamin B-12 (CYANOCOBALAMIN) 50 MCG tablet Take 50 mcg by mouth daily.     No current facility-administered medications for this visit.    Family History  Problem Relation Age of Onset  . Multiple myeloma Mother   . Stroke Father   . Hypertension Father   . Heart disease Father 70  . Kidney disease Father   . Cancer Father   . Cancer Maternal Grandmother     renal    ROS:  Pertinent items are noted in HPI.  Otherwise, a comprehensive ROS was negative.  Exam:   BP 116/60 mmHg  Pulse 74  Resp 16  Ht 5' 7.75" (1.721 m)  Wt 149 lb (67.586 kg)   BMI 22.82 kg/m2  LMP 09/15/1987  Weight change: +5#  Height: 5' 7.75" (172.1 cm)  Ht Readings from Last 3 Encounters:  09/17/15 5' 7.75" (1.721 m)  08/15/15 5' 7.75" (1.721 m)  06/17/15 5' 8" (1.727 m)    General appearance: alert, cooperative and appears stated age Head: Normocephalic, without obvious abnormality, atraumatic Neck: no adenopathy, supple, symmetrical, trachea midline and thyroid normal to inspection and palpation Breasts: normal appearance, no masses or tenderness Abdomen: soft, non-tender; bowel sounds normal; no masses,  no organomegaly Extremities: extremities normal, atraumatic, no cyanosis or edema Skin: Skin color, texture, turgor normal. No rashes or lesions Lymph nodes: Cervical, supraclavicular, and axillary nodes normal. No abnormal inguinal nodes palpated Neurologic: Grossly normal   Pelvic: External genitalia:  no lesions              Urethra:  normal appearing urethra with no masses, tenderness or lesions              Bartholins and Skenes: normal                 Vagina: normal appearing vagina with normal color and discharge, no lesions              Cervix: absent              Pap taken: Yes.   Bimanual Exam:  Uterus:  uterus absent              Adnexa: no mass, fullness, tenderness               Rectovaginal: Confirms               Anus:  normal sphincter tone, no lesions  Chaperone was present for exam.  A:  Well Woman with normal exam Remote hx of dysplasia, s/p TVH  S/P laparoscopic BSO 9/08  Osteopenia  H/O gout  Vaginal atrophic changes  S/p aortic valve replacement with Dr. Cyndia Bent 6/16 Now retired  P: Mammogram yearly. Pt thinks she's already had this so will have a release signed to ensure it is up to date.   pap smear today. Desires every other year. D/w guidelines, still desires.  On drug holiday from Fosamax. BMD planned this year but pt thinks she might have already had one.  Release of records from [facility] for MMG and BMD.  return annually or prn  

## 2015-09-18 ENCOUNTER — Encounter: Payer: Self-pay | Admitting: Obstetrics & Gynecology

## 2015-09-18 LAB — IPS PAP SMEAR ONLY

## 2015-10-02 ENCOUNTER — Encounter: Payer: Self-pay | Admitting: Cardiology

## 2015-10-03 ENCOUNTER — Encounter: Payer: Self-pay | Admitting: Cardiology

## 2015-10-06 ENCOUNTER — Encounter: Payer: Self-pay | Admitting: Cardiology

## 2015-10-14 ENCOUNTER — Encounter: Payer: Self-pay | Admitting: Obstetrics & Gynecology

## 2015-10-14 ENCOUNTER — Encounter: Payer: Self-pay | Admitting: Cardiology

## 2015-11-07 DIAGNOSIS — D72819 Decreased white blood cell count, unspecified: Secondary | ICD-10-CM | POA: Diagnosis not present

## 2015-11-07 DIAGNOSIS — N390 Urinary tract infection, site not specified: Secondary | ICD-10-CM | POA: Diagnosis not present

## 2015-11-07 DIAGNOSIS — M109 Gout, unspecified: Secondary | ICD-10-CM | POA: Diagnosis not present

## 2015-11-07 DIAGNOSIS — R8299 Other abnormal findings in urine: Secondary | ICD-10-CM | POA: Diagnosis not present

## 2015-11-07 DIAGNOSIS — E784 Other hyperlipidemia: Secondary | ICD-10-CM | POA: Diagnosis not present

## 2015-11-13 DIAGNOSIS — H01001 Unspecified blepharitis right upper eyelid: Secondary | ICD-10-CM | POA: Diagnosis not present

## 2015-11-13 DIAGNOSIS — H02831 Dermatochalasis of right upper eyelid: Secondary | ICD-10-CM | POA: Diagnosis not present

## 2015-11-13 DIAGNOSIS — H5310 Unspecified subjective visual disturbances: Secondary | ICD-10-CM | POA: Diagnosis not present

## 2015-11-13 DIAGNOSIS — H02834 Dermatochalasis of left upper eyelid: Secondary | ICD-10-CM | POA: Diagnosis not present

## 2015-11-14 DIAGNOSIS — G40409 Other generalized epilepsy and epileptic syndromes, not intractable, without status epilepticus: Secondary | ICD-10-CM | POA: Diagnosis not present

## 2015-11-14 DIAGNOSIS — Z1389 Encounter for screening for other disorder: Secondary | ICD-10-CM | POA: Diagnosis not present

## 2015-11-14 DIAGNOSIS — F329 Major depressive disorder, single episode, unspecified: Secondary | ICD-10-CM | POA: Diagnosis not present

## 2015-11-14 DIAGNOSIS — J3089 Other allergic rhinitis: Secondary | ICD-10-CM | POA: Diagnosis not present

## 2015-11-14 DIAGNOSIS — Z0000 Encounter for general adult medical examination without abnormal findings: Secondary | ICD-10-CM | POA: Diagnosis not present

## 2015-11-14 DIAGNOSIS — E784 Other hyperlipidemia: Secondary | ICD-10-CM | POA: Diagnosis not present

## 2015-11-14 DIAGNOSIS — Z6822 Body mass index (BMI) 22.0-22.9, adult: Secondary | ICD-10-CM | POA: Diagnosis not present

## 2015-11-14 DIAGNOSIS — M109 Gout, unspecified: Secondary | ICD-10-CM | POA: Diagnosis not present

## 2015-11-14 DIAGNOSIS — R05 Cough: Secondary | ICD-10-CM | POA: Diagnosis not present

## 2015-11-15 ENCOUNTER — Encounter: Payer: Self-pay | Admitting: Cardiology

## 2015-11-26 DIAGNOSIS — Z1212 Encounter for screening for malignant neoplasm of rectum: Secondary | ICD-10-CM | POA: Diagnosis not present

## 2015-11-26 LAB — IFOBT (OCCULT BLOOD): IMMUNOLOGICAL FECAL OCCULT BLOOD TEST: NEGATIVE

## 2015-12-12 ENCOUNTER — Ambulatory Visit (INDEPENDENT_AMBULATORY_CARE_PROVIDER_SITE_OTHER): Payer: PPO | Admitting: Cardiology

## 2015-12-12 ENCOUNTER — Encounter: Payer: Self-pay | Admitting: Cardiology

## 2015-12-12 VITALS — BP 130/78 | HR 72 | Ht 68.0 in | Wt 150.8 lb

## 2015-12-12 DIAGNOSIS — E785 Hyperlipidemia, unspecified: Secondary | ICD-10-CM | POA: Diagnosis not present

## 2015-12-12 DIAGNOSIS — Z954 Presence of other heart-valve replacement: Secondary | ICD-10-CM | POA: Diagnosis not present

## 2015-12-12 DIAGNOSIS — Z952 Presence of prosthetic heart valve: Secondary | ICD-10-CM

## 2015-12-12 DIAGNOSIS — Q231 Congenital insufficiency of aortic valve: Secondary | ICD-10-CM

## 2015-12-12 DIAGNOSIS — I350 Nonrheumatic aortic (valve) stenosis: Secondary | ICD-10-CM

## 2015-12-12 DIAGNOSIS — Q230 Congenital stenosis of aortic valve: Secondary | ICD-10-CM

## 2015-12-12 NOTE — Progress Notes (Signed)
 Nicole Hess Date of Birth: 07/04/1946 Medical Record #[id number]  History of Present Illness: Nicole Hess is seen today for follow up s/p AVR. She  has a history of bicuspid aortic valve with aortic stenosis.She underwent AVR with a # 21 mm Edwards Magna-Ease bioprosthetic valve by Dr. Bartle on 02/26/15. She had an uneventful recovery.  On follow up today she is doing well. No dizziness, chest pain, SOB. Incisions have healed. She is walking regularly.   Current Outpatient Prescriptions on File Prior to Visit  Medication Sig Dispense Refill  . amoxicillin (AMOXIL) 500 MG capsule Take 2 Gms (4 capsules ) 1 hour before dental work 4 capsule 2  . Ascorbic Acid (VITAMIN C) 1000 MG tablet Take 1,000 mg by mouth daily.    . b complex vitamins tablet Take 1 tablet by mouth daily.    . Calcium Carbonate-Vitamin D (CALTRATE 600+D PO) Take 1 tablet by mouth 2 (two) times daily.    . Loteprednol Etabonate (LOTEMAX OP) Apply to eye.    . Omega-3 Fatty Acids (OMEGA-3 FISH OIL PO) Take 1 capsule by mouth 2 (two) times daily.     . Potassium Chloride ER 20 MEQ TBCR Take 20 mEq by mouth daily. For 5 days then stop. 5 tablet 0  . pravastatin (PRAVACHOL) 80 MG tablet Take 80 mg by mouth daily.    . primidone (MYSOLINE) 250 MG tablet Take 250 mg by mouth 2 (two) times daily.    . Tretinoin Microsphere (RETIN-A MICRO EX) Apply 1 application topically daily.     . vitamin B-12 (CYANOCOBALAMIN) 50 MCG tablet Take 50 mcg by mouth daily.     No current facility-administered medications on file prior to visit.    Allergies  Allergen Reactions  . Penicillins Rash    Past Medical History  Diagnosis Date  . Seizures ([facility])     as a child  . Bicuspid aortic valve   . Aortic stenosis   . Leukopenia     Seen by Dr. Ha, present since 2008, watchful waiting  . Gout   . Osteopenia   . Abnormal Pap smear   . Anxiety   . Heart murmur   . Heart valve problem     will have follow up Echo-Dr Jordan-will have  replacement in a couple of years  . Coronary artery disease   . Shortness of breath dyspnea   . Depression   . Anemia     Past Surgical History  Procedure Laterality Date  . Oophorectomy    . Partial hysterectomy    . Gum surgery    . Tubal ligation    . Cervical cone biopsy    . Vaginal hysterectomy      for dysplasia  . Colonoscopy  2010  . Doppler echocardiography  3/14  . Cardiac catheterization N/A 01/29/2015    Procedure: Right/Left Heart Cath and Coronary Angiography;  Surgeon: Peter M Jordan, MD;  Location: [facility];  Service: Cardiovascular;  Laterality: N/A;  . Aortic valve replacement N/A 02/26/2015    Procedure: AORTIC VALVE REPLACEMENT (AVR);  Surgeon: Bryan K Bartle, MD;  Location: [facility];  Service: Open Heart Surgery;  Laterality: N/A;  . Tee without cardioversion N/A 02/26/2015    Procedure: TRANSESOPHAGEAL ECHOCARDIOGRAM (TEE);  Surgeon: Bryan K Bartle, MD;  Location: [facility];  Service: Open Heart Surgery;  Laterality: N/A;    History  Smoking status  . Never Smoker   Smokeless tobacco  . Never Used      History  Alcohol Use  . 4.2 oz/week  . 7 Glasses of wine per week    Comment: 2 glasses of wine/cranberry juice a day    Family History  Problem Relation Age of Onset  . Multiple myeloma Mother   . Stroke Father   . Hypertension Father   . Heart disease Father 64  . Kidney disease Father   . Cancer Father   . Cancer Maternal Grandmother     renal    Review of Systems: The review of systems is positive for chronic anxiety and insomnia.  All other systems were reviewed and are negative.  Physical Exam: BP 130/78 mmHg  Pulse 72  Ht 5' 8" (1.727 m)  Wt 68.402 kg (150 lb 12.8 oz)  BMI 22.93 kg/m2  LMP 09/15/1987 She is a pleasant white female in no acute distress. HEENT: Normal Neck: No JVD, adenopathy, thyromegaly, or bruits. Lungs: Clear Cardiovascular: Regular rate and rhythm. Normal S1 and S2. Grade 2/6 harsh  systolic flow murmur  heard best at right upper sternal border radiating to the carotids and apex. Abdomen: Soft and nontender. No masses or bruits. Bowel sounds positive. Extremities: No cyanosis or edema. Pulses are 2+ and symmetric. Skin: Warm and dry Neuro: Alert and oriented x3. Cranial nerves II through XII are intact. Is appropriate.  LABORATORY DATA: Lab Results  Component Value Date   WBC 11.5* 02/28/2015   HGB 9.2* 02/28/2015   HCT 27.2* 02/28/2015   PLT 113* 02/28/2015   GLUCOSE 99 03/02/2015   CHOL 200 01/15/2015   TRIG 81 01/15/2015   HDL 84 01/15/2015   LDLCALC 100* 01/15/2015   ALT 41 02/22/2015   AST 57* 02/22/2015   NA 138 03/02/2015   K 3.9 03/02/2015   CL 104 03/02/2015   CREATININE 0.65 03/02/2015   BUN 12 03/02/2015   CO2 28 03/02/2015   TSH 1.560 01/15/2015   INR 1.50* 02/26/2015   HGBA1C 5.5 02/22/2015    Echo: 04/24/15 Study Conclusions  - Left ventricle: The cavity size was normal. Systolic function was normal. The estimated ejection fraction was in the range of 60% to 65%. Wall motion was normal; there were no regional wall motion abnormalities. - Aortic valve: A bioprosthesis was present and functioning normally. Peak velocity (S): 250 cm/s. Mean gradient (S): 14 mm Hg. Valve area (VTI): 1.26 cm^2. Valve area (Vmax): 1.23 cm^2. Valve area (Vmean): 1.21 cm^2. - Mitral valve: Calcified annulus. Mildly thickened leaflets . There was mild regurgitation. - Right ventricle: The cavity size was mildly dilated. Wall thickness was normal.  Assessment / Plan: 1. Severe aortic stenosis with bicuspid aortic valve. S/p AVR. Good clinical recovery.  Will continue ASA and pravastatin. Follow up Echo was satisfactory. I will follow up in 6 months. Discussed need for SBE prophylaxis.

## 2015-12-12 NOTE — Patient Instructions (Signed)
Continue your current therapy  I will see you in 6 months.   

## 2015-12-22 ENCOUNTER — Encounter: Payer: Self-pay | Admitting: Cardiology

## 2015-12-30 ENCOUNTER — Encounter: Payer: Self-pay | Admitting: Cardiology

## 2015-12-30 ENCOUNTER — Telehealth: Payer: Self-pay | Admitting: Cardiology

## 2015-12-30 NOTE — Telephone Encounter (Signed)
Pt says she needs to ask you a question,something Dr Martinique said at her last office visit.

## 2015-12-30 NOTE — Telephone Encounter (Signed)
Returned call to patient.She stated she wanted to make sure ok to take antihistamine if needed for allergies.Advised ok to take a plain antihistamine daily if needed.

## 2016-01-01 ENCOUNTER — Telehealth: Payer: Self-pay | Admitting: Cardiology

## 2016-01-01 ENCOUNTER — Encounter: Payer: Self-pay | Admitting: Cardiology

## 2016-01-01 NOTE — Telephone Encounter (Signed)
New message   Pt is calling to talk to rn about her staying in the house and the affect that the pollen has on her

## 2016-01-01 NOTE — Telephone Encounter (Signed)
Spoke with pt. She was concerned about getting out in to the pollen. She had also sent an email via My Chart where Malachy Mood had responded. Pt had just checked her email and seen Cheryl's response that she could get out today for a walk.  Pt did not have any other questions at this time.

## 2016-01-02 ENCOUNTER — Telehealth: Payer: Self-pay | Admitting: Cardiology

## 2016-01-02 NOTE — Telephone Encounter (Signed)
New message      For [place] only----talk to [place] about staying away from pollen.  Ok to wait until Malachy Mood returns.

## 2016-01-02 NOTE — Telephone Encounter (Signed)
Will forward to Cheryl

## 2016-01-03 ENCOUNTER — Encounter: Payer: Self-pay | Admitting: Cardiology

## 2016-01-03 NOTE — Telephone Encounter (Signed)
Returned call to patient.Advised pollen will not affect heart valve.

## 2016-01-05 ENCOUNTER — Encounter: Payer: Self-pay | Admitting: Cardiology

## 2016-01-06 ENCOUNTER — Encounter: Payer: Self-pay | Admitting: Cardiology

## 2016-01-07 ENCOUNTER — Encounter: Payer: Self-pay | Admitting: Cardiology

## 2016-01-08 ENCOUNTER — Telehealth: Payer: Self-pay | Admitting: Cardiology

## 2016-01-08 NOTE — Telephone Encounter (Signed)
New message      Pt want to know if she can get out and walk today?  Please call

## 2016-01-08 NOTE — Telephone Encounter (Signed)
Returned call to pt. She had already received e-mail response from Elly Modena (see patient email encounter) saying she could get outside to walk today. Pt verbalized understanding.

## 2016-01-10 ENCOUNTER — Encounter: Payer: Self-pay | Admitting: Cardiology

## 2016-01-12 ENCOUNTER — Encounter: Payer: Self-pay | Admitting: Cardiology

## 2016-01-15 ENCOUNTER — Encounter: Payer: Self-pay | Admitting: Cardiology

## 2016-01-16 ENCOUNTER — Encounter: Payer: Self-pay | Admitting: Cardiology

## 2016-01-17 ENCOUNTER — Encounter: Payer: Self-pay | Admitting: Cardiology

## 2016-01-17 DIAGNOSIS — H524 Presbyopia: Secondary | ICD-10-CM | POA: Diagnosis not present

## 2016-01-17 DIAGNOSIS — H43812 Vitreous degeneration, left eye: Secondary | ICD-10-CM | POA: Diagnosis not present

## 2016-01-17 DIAGNOSIS — H25813 Combined forms of age-related cataract, bilateral: Secondary | ICD-10-CM | POA: Diagnosis not present

## 2016-01-17 DIAGNOSIS — H35372 Puckering of macula, left eye: Secondary | ICD-10-CM | POA: Diagnosis not present

## 2016-01-19 ENCOUNTER — Encounter: Payer: Self-pay | Admitting: Cardiology

## 2016-01-21 ENCOUNTER — Encounter: Payer: Self-pay | Admitting: Cardiology

## 2016-01-22 ENCOUNTER — Telehealth: Payer: Self-pay | Admitting: Cardiology

## 2016-01-22 ENCOUNTER — Encounter: Payer: Self-pay | Admitting: Surgery

## 2016-01-22 ENCOUNTER — Encounter: Payer: Self-pay | Admitting: Cardiology

## 2016-01-22 NOTE — Telephone Encounter (Signed)
New message     The pt is calling asking if it is ok to go outside and walking, pt states she has already been walking today. Pt meeds to speak with the  nurse.

## 2016-01-22 NOTE — Telephone Encounter (Signed)
Returned call to patient.Advised ok to walk outside.She stated she was feeling good,no problems.

## 2016-01-27 ENCOUNTER — Encounter: Payer: Self-pay | Admitting: Obstetrics & Gynecology

## 2016-02-03 ENCOUNTER — Encounter: Payer: Self-pay | Admitting: Cardiology

## 2016-02-03 DIAGNOSIS — H15101 Unspecified episcleritis, right eye: Secondary | ICD-10-CM | POA: Diagnosis not present

## 2016-02-08 ENCOUNTER — Encounter: Payer: Self-pay | Admitting: Cardiology

## 2016-02-08 ENCOUNTER — Encounter: Payer: Self-pay | Admitting: Obstetrics & Gynecology

## 2016-02-09 ENCOUNTER — Encounter (HOSPITAL_COMMUNITY): Payer: Self-pay

## 2016-02-09 ENCOUNTER — Emergency Department (HOSPITAL_COMMUNITY): Payer: PPO

## 2016-02-09 ENCOUNTER — Emergency Department (HOSPITAL_COMMUNITY)
Admission: EM | Admit: 2016-02-09 | Discharge: 2016-02-09 | Disposition: A | Discharge status: Home / Self Care | Payer: PPO | Attending: Emergency Medicine | Admitting: Emergency Medicine

## 2016-02-09 ENCOUNTER — Encounter: Payer: Self-pay | Admitting: Cardiology

## 2016-02-09 ENCOUNTER — Encounter: Payer: Self-pay | Admitting: Obstetrics & Gynecology

## 2016-02-09 DIAGNOSIS — E871 Hypo-osmolality and hyponatremia: Secondary | ICD-10-CM

## 2016-02-09 DIAGNOSIS — R5383 Other fatigue: Secondary | ICD-10-CM | POA: Diagnosis not present

## 2016-02-09 DIAGNOSIS — Z7982 Long term (current) use of aspirin: Secondary | ICD-10-CM | POA: Insufficient documentation

## 2016-02-09 DIAGNOSIS — E875 Hyperkalemia: Secondary | ICD-10-CM | POA: Insufficient documentation

## 2016-02-09 DIAGNOSIS — Z79899 Other long term (current) drug therapy: Secondary | ICD-10-CM | POA: Insufficient documentation

## 2016-02-09 DIAGNOSIS — F329 Major depressive disorder, single episode, unspecified: Secondary | ICD-10-CM | POA: Insufficient documentation

## 2016-02-09 DIAGNOSIS — Z792 Long term (current) use of antibiotics: Secondary | ICD-10-CM | POA: Insufficient documentation

## 2016-02-09 DIAGNOSIS — I38 Endocarditis, valve unspecified: Secondary | ICD-10-CM | POA: Diagnosis not present

## 2016-02-09 DIAGNOSIS — I2510 Atherosclerotic heart disease of native coronary artery without angina pectoris: Secondary | ICD-10-CM | POA: Diagnosis not present

## 2016-02-09 LAB — URINALYSIS, ROUTINE W REFLEX MICROSCOPIC
Bilirubin Urine: NEGATIVE
Glucose, UA: NEGATIVE mg/dL
Hgb urine dipstick: NEGATIVE
Ketones, ur: 15 mg/dL — AB
LEUKOCYTES UA: NEGATIVE
Nitrite: NEGATIVE
PH: 6 (ref 5.0–8.0)
PROTEIN: NEGATIVE mg/dL
Specific Gravity, Urine: 1.014 (ref 1.005–1.030)

## 2016-02-09 LAB — CBC WITH DIFFERENTIAL/PLATELET
Basophils Absolute: 0 10*3/uL (ref 0.0–0.1)
Basophils Relative: 0 %
Eosinophils Absolute: 0 10*3/uL (ref 0.0–0.7)
Eosinophils Relative: 1 %
HCT: 39.1 % (ref 36.0–46.0)
HEMOGLOBIN: 13 g/dL (ref 12.0–15.0)
LYMPHS ABS: 1.4 10*3/uL (ref 0.7–4.0)
LYMPHS PCT: 33 %
MCH: 30.2 pg (ref 26.0–34.0)
MCHC: 33.2 g/dL (ref 30.0–36.0)
MCV: 90.7 fL (ref 78.0–100.0)
Monocytes Absolute: 0.3 10*3/uL (ref 0.1–1.0)
Monocytes Relative: 6 %
Neutro Abs: 2.6 10*3/uL (ref 1.7–7.7)
Neutrophils Relative %: 60 %
PLATELETS: 200 10*3/uL (ref 150–400)
RBC: 4.31 MIL/uL (ref 3.87–5.11)
RDW: 12.8 % (ref 11.5–15.5)
WBC: 4.3 10*3/uL (ref 4.0–10.5)

## 2016-02-09 LAB — BASIC METABOLIC PANEL
Anion gap: 10 (ref 5–15)
BUN: 13 mg/dL (ref 6–20)
CHLORIDE: 93 mmol/L — AB (ref 101–111)
CO2: 25 mmol/L (ref 22–32)
Calcium: 9.1 mg/dL (ref 8.9–10.3)
Creatinine, Ser: 0.86 mg/dL (ref 0.44–1.00)
GFR calc Af Amer: >60 mL/min (ref >60)
GFR calc non Af Amer: >60 mL/min (ref >60)
Glucose, Bld: 101 mg/dL — ABNORMAL HIGH (ref 65–99)
Potassium: 5.3 mmol/L — ABNORMAL HIGH (ref 3.5–5.1)
Sodium: 128 mmol/L — ABNORMAL LOW (ref 135–145)

## 2016-02-09 LAB — I-STAT TROPONIN, ED: Troponin i, poc: 0 ng/mL (ref 0.00–0.08)

## 2016-02-09 LAB — TSH: TSH: 1.087 u[IU]/mL (ref 0.350–4.500)

## 2016-02-09 MED ORDER — SODIUM CHLORIDE 0.9 % IV SOLN
Freq: Once | INTRAVENOUS | Status: AC
  Administered 2016-02-09: 15:00:00 via INTRAVENOUS

## 2016-02-09 NOTE — ED Provider Notes (Signed)
CSN: [id number]     Arrival date & time 02/09/16  1242 History   First MD Initiated Contact with Patient 02/09/16 1302     Chief Complaint  Patient presents with  . Fatigue     (Consider location/radiation/quality/duration/timing/severity/associated sxs/prior Treatment) HPI  Blood pressure 155/73, pulse 72, temperature 98 F (36.7 C), temperature source Oral, resp. rate 16, height 5' 8"  (1.727 m), weight 66.271 kg, last menstrual period 09/15/1987, SpO2 98 %.  Nicole Hess is a 70 y.o. 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 Patient denies chest pain, shortness of breath, cough above her baseline (she has issues with her sinuses), fever, chills, nausea, vomiting, change in bowel or bladder habits, syncope. Was advised by family member who is a cardiologist to come to the ED for evaluation.  Past Medical History  Diagnosis Date  . Seizures ([place])     as a child  . Bicuspid aortic valve   . Aortic stenosis   . Leukopenia     Seen by Dr. Lamonte Sakai, present since 2008, watchful waiting  . Gout   . Osteopenia   . Abnormal Pap smear   . Anxiety   . Heart murmur   . Heart valve problem     will have follow up Echo-Dr Jordan-will have replacement in a couple of years  . Coronary artery disease   . Shortness of breath dyspnea   . Depression   . Anemia    Past Surgical History  Procedure Laterality Date  . Oophorectomy    . Partial hysterectomy    . Gum surgery    . Tubal ligation    . Cervical cone biopsy    . Vaginal hysterectomy      for dysplasia  . Colonoscopy  2010  . Doppler echocardiography  3/14  . Cardiac catheterization N/A 01/29/2015    Procedure: Right/Left Heart Cath and Coronary Angiography;  Surgeon: Peter M Martinique, MD;  Location:  [facility];  Service: Cardiovascular;  Laterality: N/A;  . Aortic valve replacement N/A 02/26/2015    Procedure: AORTIC VALVE REPLACEMENT (AVR);  Surgeon: Gaye Pollack, MD;  Location: [facility];  Service: Open Heart Surgery;  Laterality: N/A;  . Tee without cardioversion N/A 02/26/2015    Procedure: TRANSESOPHAGEAL ECHOCARDIOGRAM (TEE);  Surgeon: Gaye Pollack, MD;  Location: [facility];  Service: Open Heart Surgery;  Laterality: N/A;   Family History  Problem Relation Age of Onset  . Multiple myeloma Mother   . Stroke Father   . Hypertension Father   . Heart disease Father 29  . Kidney disease Father   . Cancer Father   . Cancer Maternal Grandmother     renal   Social History  Substance Use Topics  . Smoking status: Never Smoker   . Smokeless tobacco: Never Used  . Alcohol Use: 4.2 oz/week    7 Glasses of wine per week     Comment: 2 glasses of wine/cranberry juice a day   OB History    Gravida Para Term Preterm AB TAB SAB Ectopic Multiple Living   0 0 0 0 0 0 0 0 0 0      Review of Systems  10 systems reviewed and found to be negative,  except as noted in the HPI.   Allergies  Other and Penicillins  Home Medications   Prior to Admission medications   Medication Sig Start Date End Date Taking? Authorizing Provider  Ascorbic Acid (VITAMIN C) 1000 MG tablet Take 1,000 mg by mouth daily.   Yes Historical Provider, MD  aspirin 81 MG tablet Take 81 mg by mouth daily.   Yes Historical Provider, MD  b complex vitamins tablet Take 1 tablet by mouth daily.   Yes Historical Provider, MD  Calcium Carbonate-Vitamin D (CALTRATE 600+D PO) Take 1 tablet by mouth 2 (two) times daily.   Yes Historical Provider, MD  ketorolac (ACULAR) 0.4 % SOLN Place 1 drop into the right eye 4 (four) times daily.   Yes Historical Provider, MD  Omega-3 Fatty Acids (OMEGA-3 FISH OIL PO) Take 1 capsule by mouth 2 (two) times daily.    Yes Historical Provider, MD  POTASSIUM PO Take 1 tablet by mouth daily.    Yes Historical Provider, MD  primidone (MYSOLINE) 250 MG tablet Take 250 mg by mouth 2 (two) times daily.   Yes Historical Provider, MD  vitamin B-12 (CYANOCOBALAMIN) 50 MCG tablet Take 50 mcg by mouth daily.   Yes Historical Provider, MD  amoxicillin (AMOXIL) 500 MG capsule Take 2 Gms (4 capsules ) 1 hour before dental work Patient not taking: Reported on 02/09/2016 08/29/15   Peter M Martinique, MD  Loteprednol Etabonate ([place] [facility]) Apply to eye.    Historical Provider, MD  Potassium Chloride ER 20 MEQ TBCR Take 20 mEq by mouth daily. For 5 days then stop. Patient not taking: Reported on 02/09/2016 03/04/15   Nani Skillern, PA-C  pravastatin (PRAVACHOL) 80 MG tablet Take 80 mg by mouth daily.    Historical Provider, MD  Tretinoin Microsphere (RETIN-A MICRO EX) Apply 1 application topically daily.     Historical Provider, MD   BP 120/91 mmHg  Pulse 70  Temp(Src) 98 F (36.7 C) (Oral)  Resp 16  Ht 5' 8"  (1.727 m)  Wt 66.271 kg  BMI 22.22 kg/m2  SpO2 96%  LMP 09/15/1987 Physical Exam  Constitutional: She is oriented to person, place, and time. She appears well-developed and well-nourished. No distress.  HENT:  Head: Normocephalic.  Mouth/Throat: Oropharynx is clear and moist.  Eyes: Conjunctivae and EOM are normal.  Neck: Normal range of motion. No JVD present. No tracheal deviation present.  Cardiovascular: Normal rate, regular rhythm and intact distal pulses.   Murmur heard. Systolic murmur   Radial pulse equal bilaterally  Pulmonary/Chest: Effort normal and breath sounds normal. No stridor. No respiratory distress. She has no wheezes. She has no rales. She exhibits no tenderness.  Abdominal: Soft. She exhibits no distension and no mass. There is no tenderness. There is no rebound and no guarding.  Musculoskeletal: Normal range of motion. She exhibits no edema or tenderness.  No calf asymmetry, superficial collaterals, palpable cords, edema, Homans sign negative bilaterally.     Neurological: She is alert and oriented to person, place, and time.  Skin: Skin is warm. She is not diaphoretic.  Psychiatric: She has a normal mood and affect.  Nursing note and vitals reviewed.   ED Course  Procedures (including critical care time) Labs Review Labs Reviewed  BASIC METABOLIC PANEL - Abnormal; Notable for the following:    Sodium 128 (*)    Potassium 5.3 (*)    Chloride 93 (*)    Glucose, Bld 101 (*)    All other components within  normal limits  URINALYSIS, ROUTINE W REFLEX MICROSCOPIC (NOT AT [facility]) - Abnormal; Notable for the following:    APPearance HAZY (*)    Ketones, ur 15 (*)    All other components within normal limits  CBC WITH DIFFERENTIAL/PLATELET  TSH  I-STAT TROPOININ, ED    Imaging Review Dg Chest 2 View  02/09/2016  CLINICAL DATA:  70 year old female with 1 week history of fatigue. EXAM: CHEST  2 VIEW COMPARISON:  Chest x-ray 04/03/2015. FINDINGS: Lung volumes are normal. No consolidative airspace disease. No pleural effusions. No pneumothorax. No pulmonary nodule or mass noted. Pulmonary vasculature and the cardiomediastinal silhouette are within normal limits. Status post median sternotomy for aortic valve replacement (a stented bio prosthesis is noted). IMPRESSION: 1.  No radiographic evidence of acute cardiopulmonary disease. Electronically Signed   By: Vinnie Langton M.D.   On: 02/09/2016 14:25   I have personally reviewed and evaluated these images and lab results as part of my medical decision-making.   EKG Interpretation   Date/Time:  Sunday Feb 09 2016 13:06:33 EDT Ventricular Rate:  73 PR Interval:  138 QRS Duration: 82 QT Interval:  380 QTC Calculation: 418 R Axis:   34 Text Interpretation:  Normal sinus rhythm Possible Left atrial enlargement  Nonspecific T wave abnormality No significant change since last tracing  Confirmed by Maryan Rued  MD, Loree Fee ([id number]) on 02/09/2016 1:22:37 PM      MDM   Final diagnoses:   Hyponatremia  Hyperkalemia  Other fatigue    Filed Vitals:   02/09/16 1303 02/09/16 1445 02/09/16 1500 02/09/16 1530  BP: 155/73 124/54  120/91  Pulse: 72 59  70  Temp: 98 F (36.7 C)     TempSrc: Oral     Resp: 16 17 17 16   Height: 5' 8"  (1.727 m)     Weight: 66.271 kg     SpO2: 98% 100%  96%    Medications  0.9 %  sodium chloride infusion ( Intravenous New Bag/Given 02/09/16 1449)    Nicole Hess is 70 y.o. female presenting with Fatigue, states she is concerned because he feels that something may be wrong with her porcine aortic valve which was replaced 1 year ago. Patient denies chest pain, shortness of breath, palpitations, syncope, increasing peripheral edema. Afebrile with stable vital signs, EKG unchanged, troponin negative, blood work reassuring, urinalysis without signs of infection. Chemistry with a lecture light abnormalities of hyponatremia to 128 and potassium of 5.3, chloride is also low at 93. Patient takes potassium supplementation and she also is trying to avoid sodium in her diet, we've had an extensive conversation about this and patient verbalizes her effort to reduce sodium intake should be ceased and that should be rechecked at primary care next week. She has some concerns about primary care evaluating this, advised her she can also see her cardiologist if necessary.  TSH pending case signed out to Dr. Henry Russel at shift change.   This is a shared visit with the attending physician who personally evaluated the patient and agrees with the care plan.        Monico Blitz, PA-C 02/09/16 1616  Blanchie Dessert, MD 02/11/16 1427

## 2016-02-09 NOTE — ED Notes (Signed)
PA at bedside.

## 2016-02-09 NOTE — ED Notes (Signed)
Pt transported to xray 

## 2016-02-09 NOTE — ED Notes (Signed)
Pt had aortic value replacement surgery last year.  Pt reports in past 3-4 days pt has been sleeping and more fatigued.  Pt concerned because this is the symptoms she had prior to her surgery last year.  No chest pain, nausea or shortness of breath.

## 2016-02-09 NOTE — Discharge Instructions (Signed)
Your potassium today is too high, it is important that you not take any potassium supplements.  Sodium today is too low, please stop trying to avoid salt in your diet. You will need a recheck of your potassium and sodium by either primary care doctor or cardiology next week.  Please follow with your primary care doctor in the next 7 days for a check-up. They must obtain records for further management.   Do not hesitate to return to the Emergency Department for any new, worsening or concerning symptoms.    Hyponatremia Hyponatremia is when the amount of salt (sodium) in your blood is too low. When sodium levels are low, your cells absorb extra water and they swell. The swelling happens throughout the body, but it mostly affects the brain. CAUSES This condition may be caused by:  Heart, kidney, or liver problems.  Thyroid problems.  Adrenal gland problems.  Metabolic conditions, such as syndrome of inappropriate antidiuretic hormone (SIADH).  Severe vomiting and diarrhea.  Certain medicines or illegal drugs.  Dehydration.  Drinking too much water.  Eating a diet that is low in sodium.  Large burns on your body.  Sweating. RISK FACTORS This condition is more likely to develop in people who:  Have long-term (chronic) kidney disease.  Have heart failure.  Have a medical condition that causes frequent or excessive diarrhea.  Have metabolic conditions, such as Addison disease or SIADH.  Take certain medicines that affect the sodium and fluid balance in the blood. Some of these medicine types include:  Diuretics.  NSAIDs.  Some opioid pain medicines.  Some antidepressants.  Some seizure prevention medicines. SYMPTOMS  Symptoms of this condition include:  Nausea and vomiting.  Confusion.  Lethargy.  Agitation.  Headache.  Seizures.  Unconsciousness.  Appetite loss.  Muscle weakness and cramping.  Feeling weak or light-headed.  Having a rapid  heart rate.  Fainting, in severe cases. DIAGNOSIS This condition is diagnosed with a medical history and physical exam. You will also have other tests, including:  Blood tests.  Urine tests. TREATMENT Treatment for this condition depends on the cause. Treatment may include:  Fluids given through an IV tube that is inserted into one of your veins.  Medicines to correct the sodium imbalance. If medicines are causing the condition, the medicines will need to be adjusted.  Limiting water or fluid intake to get the correct sodium balance. HOME CARE INSTRUCTIONS  Take medicines only as directed by your health care provider. Many medicines can make this condition worse. Talk with your health care provider about any medicines that you are currently taking.  Carefully follow a recommended diet as directed by your health care provider.  Carefully follow instructions from your health care provider about fluid restrictions.  Keep all follow-up visits as directed by your health care provider. This is important.  Do not drink alcohol. SEEK MEDICAL CARE IF:  You develop worsening nausea, fatigue, headache, confusion, or weakness.  Your symptoms go away and then return.  You have problems following the recommended diet. SEEK IMMEDIATE MEDICAL CARE IF:  You have a seizure.  You faint.  You have ongoing diarrhea or vomiting.   This information is not intended to replace advice given to you by your health care provider. Make sure you discuss any questions you have with your health care provider.   Document Released: 08/21/2002 Document Revised: 01/15/2015 Document Reviewed: 09/20/2014 Elsevier Interactive Patient Education 2016 Elsevier Inc.  Hyperkalemia Hyperkalemia is when you have too much  potassium in your blood. Potassium is normally removed (excreted) from your body by your kidneys. If there is too much potassium in your blood, it can affect your heart's ability to function.    CAUSES  Hyperkalemia may be caused by:   Taking in too much potassium. You can do this by:  Using salt substitutes. They contain large amounts of potassium.  Taking potassium supplements.  Eating foods high in potassium.  Excreting too little potassium. This can happen if:  Your kidneys are not working properly. Kidney (renal) disease, including short- or long-term renal failure, is a very common cause of hyperkalemia.  You are taking medicines that lower your excretion of potassium.  You have Addison disease.  You have a urinary tract blockage, such as kidney stones.  You are on treatment to mechanically clean your blood (dialysis) and you skip a treatment.  Releasing a high amount of potassium from your cells into your blood. This can happen with:  Injury to muscles (rhabdomyolysis) or other tissues. Most potassium is stored in your muscles.  Severe burns or infections.  Acidic blood plasma (acidosis). Acidosis can result from many diseases, such as uncontrolled diabetes. RISK FACTORS The most common risk factor of hyperkalemia is kidney disease. Other risk factors of hyperkalemia include:  Addison disease. This is a condition where your glands do not produce enough hormones.  Alcoholism or heavy drug use.   Using certain blood pressure medicines, such as angiotensin-converting enzyme (ACE) inhibitors, angiotensin II receptor blockers (ARBs), or potassium-sparing diuretics such as spironolactone.  Severe injury or burn. SIGNS AND SYMPTOMS  Oftentimes, there are no signs or symptoms of hyperkalemia. However, when your potassium level becomes high enough, you may experience symptoms such as:  Irregular or very slow heartbeat.  Nausea.  Fatigue.  Tingling of the skin or numbness of the hands or feet.  Muscle weakness.  Fatigue.  Not being able to move (paralysis). You may not have any symptoms of hyperkalemia.  DIAGNOSIS  Hyperkalemia may be diagnosed  by:  Physical exam.  Blood tests.  ECG (electrocardiogram).  Discussion of prescription and non-prescription drug use. TREATMENT  Treatment for hyperkalemia is often directed at the underlying cause. In some instances, treatment may include:   Insulin.  Glucose (sugar) and water solution given through a vein (intravenous or IV).  Dialysis.  Medicines to remove the potassium from your body.  Medicines to move calcium from your bloodstream into your tissues. HOME CARE INSTRUCTIONS   Take medicines only as directed by your health care provider.  Do not take any supplements, natural products, herbs, or vitamins without reviewing them with your health care provider. Certain supplements and natural food products can have high amounts of potassium.  Limit your alcohol intake as directed by your health care provider.  Stop illegal drug use. If you need help quitting, ask your health care provider.  Keep all follow-up visits as directed by your health care provider. This is important.  If you have kidney disease, you may need to follow a low potassium diet. A dietitian can help educate you on low potassium foods. SEEK MEDICAL CARE IF:   You notice an irregular or very slow heartbeat.  You feel light-headed.  You feel weak.  You are nauseous.  You have tingling or numbness in your hands or feet. SEEK IMMEDIATE MEDICAL CARE IF:   You have shortness of breath.  You have chest pain or discomfort.  You pass out.  You have muscle paralysis. MAKE SURE  YOU:   Understand these instructions.  Will watch your condition.  Will get help right away if you are not doing well or get worse.   This information is not intended to replace advice given to you by your health care provider. Make sure you discuss any questions you have with your health care provider.   Document Released: 08/21/2002 Document Revised: 09/21/2014 Document Reviewed: 12/06/2013 Elsevier Interactive  Patient Education [employer].

## 2016-02-11 ENCOUNTER — Telehealth: Payer: Self-pay | Admitting: Cardiology

## 2016-02-11 ENCOUNTER — Encounter: Payer: Self-pay | Admitting: Cardiology

## 2016-02-11 DIAGNOSIS — R7989 Other specified abnormal findings of blood chemistry: Secondary | ICD-10-CM

## 2016-02-11 DIAGNOSIS — E875 Hyperkalemia: Secondary | ICD-10-CM

## 2016-02-11 MED ORDER — PRAVASTATIN SODIUM 80 MG PO TABS: 80.0000 mg | ORAL_TABLET | Freq: Every day | ORAL | Status: DC

## 2016-02-11 NOTE — Telephone Encounter (Signed)
Agree with stopping potassium. I don't know why sodium is low. She is not on a diuretic. Recommend repeat BMET next week.  Peter Martinique MD, [facility]

## 2016-02-11 NOTE — Telephone Encounter (Signed)
Pt calling to fu om mychart merssage from 02-09-16 re her Potassium-pls advise

## 2016-02-11 NOTE — Telephone Encounter (Signed)
Pt calling to make Sept fu appt with Dr, Ephraim Hamburger on my chart message from 02-09-16 regarding her potatssium -pls advise [phone]

## 2016-02-11 NOTE — Telephone Encounter (Signed)
Returned call to patient.Dr.Jordan's recommendations given.Lab order mailed to patient.

## 2016-02-11 NOTE — Telephone Encounter (Signed)
Spoke to patient about emails today.Stated she was feeling tired this past Friday.Stated she stayed in bed all day and did not eat or drink all day.Stated she continued to feel tired on Saturday,she went to [facility] ER.Stated lab revealed low sodium and elevated potassium.Potassium supplement was not stopped.Advised to hold potassium supplement.Stated she has been trying to eat more salt.Advised not to eat more salt.Advised to eat heart healthy diet low cholesterol low fat.Medications reviewed with her,she is not taking pravachol.Stated she does not know when or why she stopped taking.Pravachol refill sent to pharmacy.Advised I will send message to [place] for advice on when to repeat lab.

## 2016-02-11 NOTE — Addendum Note (Signed)
Addended by: Kathyrn Lass on: 02/11/2016 05:29 PM   Modules accepted: Orders

## 2016-02-11 NOTE — Telephone Encounter (Signed)
Patient called no answer.Left message on personal voice mail. I was calling

## 2016-02-12 ENCOUNTER — Other Ambulatory Visit: Payer: Self-pay

## 2016-02-12 DIAGNOSIS — H01001 Unspecified blepharitis right upper eyelid: Secondary | ICD-10-CM | POA: Diagnosis not present

## 2016-02-12 DIAGNOSIS — H01004 Unspecified blepharitis left upper eyelid: Secondary | ICD-10-CM | POA: Diagnosis not present

## 2016-02-12 DIAGNOSIS — H25813 Combined forms of age-related cataract, bilateral: Secondary | ICD-10-CM | POA: Diagnosis not present

## 2016-02-12 NOTE — Telephone Encounter (Signed)
Patient called no answer.Left message on personal voice mail calling you about your email, do not take potassium supplement.Advised to call me back today to discuss.

## 2016-02-12 NOTE — Telephone Encounter (Signed)
Patient walked in office stated she just left eye Dr office.She has inflammation in right eye was prescribed Lotemax and Ketdorac eye gtts.She wanted to ask Dr.Jordan if ok to use.Advised Dr.Jordan is out of office today.Advised ok to start using eye gtts today and I will ask him 02/13/16 and call you back.

## 2016-02-13 ENCOUNTER — Encounter: Payer: Self-pay | Admitting: Cardiology

## 2016-02-13 DIAGNOSIS — E871 Hypo-osmolality and hyponatremia: Secondary | ICD-10-CM | POA: Diagnosis not present

## 2016-02-13 DIAGNOSIS — G40409 Other generalized epilepsy and epileptic syndromes, not intractable, without status epilepticus: Secondary | ICD-10-CM | POA: Diagnosis not present

## 2016-02-13 DIAGNOSIS — Z6822 Body mass index (BMI) 22.0-22.9, adult: Secondary | ICD-10-CM | POA: Diagnosis not present

## 2016-02-13 DIAGNOSIS — R5383 Other fatigue: Secondary | ICD-10-CM | POA: Diagnosis not present

## 2016-02-13 DIAGNOSIS — E784 Other hyperlipidemia: Secondary | ICD-10-CM | POA: Diagnosis not present

## 2016-02-17 ENCOUNTER — Encounter: Payer: Self-pay | Admitting: Cardiology

## 2016-02-17 DIAGNOSIS — R7989 Other specified abnormal findings of blood chemistry: Secondary | ICD-10-CM | POA: Diagnosis not present

## 2016-02-17 DIAGNOSIS — R799 Abnormal finding of blood chemistry, unspecified: Secondary | ICD-10-CM | POA: Diagnosis not present

## 2016-02-17 DIAGNOSIS — E875 Hyperkalemia: Secondary | ICD-10-CM | POA: Diagnosis not present

## 2016-02-17 LAB — BASIC METABOLIC PANEL
BUN: 15 mg/dL (ref 7–25)
CHLORIDE: 102 mmol/L (ref 98–110)
CO2: 28 mmol/L (ref 20–31)
CREATININE: 0.71 mg/dL (ref 0.50–0.99)
Calcium: 8.7 mg/dL (ref 8.6–10.4)
Glucose, Bld: 86 mg/dL (ref 65–99)
Potassium: 3.9 mmol/L (ref 3.5–5.3)
Sodium: 141 mmol/L (ref 135–146)

## 2016-02-21 ENCOUNTER — Encounter: Payer: Self-pay | Admitting: Cardiology

## 2016-02-21 ENCOUNTER — Telehealth: Payer: Self-pay | Admitting: Physician Assistant

## 2016-02-21 NOTE — Telephone Encounter (Signed)
     Patient saw eye dr today and it was recommended that she take a short course of excedrin. SHe was worried she wasn't able to take this with her valve replacement. I reassured her that it was fine.   Angelena Form PA-C  [facility]

## 2016-02-21 NOTE — Telephone Encounter (Signed)
Patient walked in office this morning wanting to know if ok to lay in a tanning bed.Advised tanning bed will not hurt your heart,but it is not reccommended can cause skin cancer.Also patient was told to continue walking daily in response to her email concerning walking too much.Stated she walks about 30 mins each day.

## 2016-02-26 ENCOUNTER — Encounter: Payer: Self-pay | Admitting: Cardiology

## 2016-02-26 ENCOUNTER — Encounter: Payer: Self-pay | Admitting: Surgery

## 2016-02-26 DIAGNOSIS — H15011 Anterior scleritis, right eye: Secondary | ICD-10-CM | POA: Diagnosis not present

## 2016-03-02 ENCOUNTER — Telehealth: Payer: Self-pay | Admitting: *Deleted

## 2016-03-02 NOTE — Telephone Encounter (Signed)
Patient came in to have someone look at her footies she had bought to wear under her tennis shoes They do not appear to be constricting in any way Advised ok to wear but if they leave an indention try a larger size, verbalized understanding

## 2016-03-03 ENCOUNTER — Encounter: Payer: Self-pay | Admitting: Cardiology

## 2016-03-03 DIAGNOSIS — L821 Other seborrheic keratosis: Secondary | ICD-10-CM | POA: Diagnosis not present

## 2016-03-03 DIAGNOSIS — I781 Nevus, non-neoplastic: Secondary | ICD-10-CM | POA: Diagnosis not present

## 2016-03-05 ENCOUNTER — Encounter: Payer: Self-pay | Admitting: Cardiology

## 2016-03-05 ENCOUNTER — Other Ambulatory Visit: Payer: Self-pay

## 2016-03-05 MED ORDER — PRAVASTATIN SODIUM 80 MG PO TABS: 80.0000 mg | ORAL_TABLET | Freq: Every day | ORAL | Status: DC

## 2016-03-12 ENCOUNTER — Encounter: Payer: Self-pay | Admitting: Cardiology

## 2016-03-12 ENCOUNTER — Telehealth: Payer: Self-pay | Admitting: Cardiology

## 2016-03-12 DIAGNOSIS — L600 Ingrowing nail: Secondary | ICD-10-CM | POA: Diagnosis not present

## 2016-03-20 DIAGNOSIS — H15011 Anterior scleritis, right eye: Secondary | ICD-10-CM | POA: Diagnosis not present

## 2016-03-27 ENCOUNTER — Encounter: Payer: Self-pay | Admitting: Cardiology
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
## 2016-03-31 ENCOUNTER — Encounter: Payer: Self-pay | Admitting: Cardiology

## 2016-04-02 ENCOUNTER — Encounter: Payer: Self-pay | Admitting: Cardiology

## 2016-04-06 ENCOUNTER — Encounter: Payer: Self-pay | Admitting: Cardiology

## 2016-04-17 DIAGNOSIS — H15011 Anterior scleritis, right eye: Secondary | ICD-10-CM | POA: Diagnosis not present

## 2016-04-19 ENCOUNTER — Encounter: Payer: Self-pay | Admitting: Cardiology

## 2016-04-20 ENCOUNTER — Encounter: Payer: Self-pay | Admitting: Cardiology

## 2016-04-22 ENCOUNTER — Telehealth: Payer: Self-pay | Admitting: *Deleted

## 2016-04-22 NOTE — Telephone Encounter (Signed)
Pt walked in office requesting to speak with RN.  RN to speak with patient-patient was concerned her bra was too tight and it was "cutting" into her skin.  Pt proceeded to show RN-no signs of being too tight noted, no signs of skin irritation or redness noted.  Encouraged patient that RN did not see any signs of irritation at this time.  Pt verbalized understanding and thanked RN for helping her.

## 2016-04-29 ENCOUNTER — Encounter: Payer: Self-pay | Admitting: Cardiology

## 2016-04-30 ENCOUNTER — Telehealth: Payer: Self-pay
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
## 2016-05-05 ENCOUNTER — Encounter: Payer: Self-pay | Admitting: Cardiology

## 2016-05-09 ENCOUNTER — Encounter: Payer: Self-pay | Admitting: Cardiology

## 2016-05-10 ENCOUNTER — Encounter: Payer: Self-pay | Admitting: Cardiology

## 2016-05-12 ENCOUNTER — Telehealth: Payer: Self-pay | Admitting: Cardiology

## 2016-05-12 ENCOUNTER — Encounter: Payer: Self-pay | Admitting: Cardiology

## 2016-05-12 NOTE — Telephone Encounter (Signed)
Call addressed. Pt was at [facility] today to get hepatitis shot but noted that Dr. Sharlett Iles had advised she did not need the shot. Based on available documentation, we had advised her to defer to Dr. Sharlett Iles on whether she needs the shot or not. Pt understands Dr. Martinique wanted to defer the decision to PCP and she should follow Dr. Buel Ream recommendation on this. Understanding verbalized.

## 2016-05-13 ENCOUNTER — Telehealth: Payer: Self-pay | Admitting: Obstetrics & Gynecology

## 2016-05-13 ENCOUNTER — Telehealth: Payer: Self-pay | Admitting: *Deleted

## 2016-05-13 ENCOUNTER — Other Ambulatory Visit (INDEPENDENT_AMBULATORY_CARE_PROVIDER_SITE_OTHER): Payer: PPO

## 2016-05-13 ENCOUNTER — Other Ambulatory Visit: Payer: Self-pay | Admitting: Obstetrics & Gynecology

## 2016-05-13 ENCOUNTER — Encounter: Payer: Self-pay | Admitting: Cardiology

## 2016-05-13 DIAGNOSIS — Z205 Contact with and (suspected) exposure to viral hepatitis: Secondary | ICD-10-CM

## 2016-05-13 NOTE — Telephone Encounter (Signed)
Patient came into the office requesting to speak with the nurse about hepatitis testing or vaccines she may need.

## 2016-05-13 NOTE — Telephone Encounter (Signed)
Spoke with patient. Advised of message as seen below from [place]. She is agreeable and verbalizes understanding. OV cancelled for 05/14/2016. Lab appointment scheduled for today 05/13/2016 at 1:30 pm. Patient is agreeable to date and time.  Routing to provider for final review. Patient agreeable to disposition. Will close encounter.

## 2016-05-13 NOTE — Telephone Encounter (Signed)
Pt walked in requesting to know if she needed to get Hep shot and which one to get.  Reports PCP did not recommend of does not give this shot.  Advised that if PCP did not recommend, we have no further recommendations at this time.  As previously discussed with her, she can further discuss with MD Martinique at next OV if desired.  Pt verbalized understanding.

## 2016-05-13 NOTE — Telephone Encounter (Signed)
Advised Starla Curl that patient will need to be seen in office for discussion with MD and possible lab work. Lavella Hammock has scheduled the patient for 05/14/2016 at 2:30 pm with Dr.Miller.  Routing to provider for final review. Patient agreeable to disposition. Will close encounter.

## 2016-05-13 NOTE — Telephone Encounter (Signed)
Please let her know I do recommend she do the Hep C testing.  Tetanus is due to be updated next year.  Zoster and pneumonia are UTD.  I put in order for pt to have Hep C antibody drawn.  Ok to switch to lab schedule and cancel appt for pt.    CC:  Nicole Hess

## 2016-05-14 ENCOUNTER — Encounter: Payer: Self-pay | Admitting: Cardiology

## 2016-05-14 ENCOUNTER — Ambulatory Visit: Payer: PPO | Admitting: Obstetrics & Gynecology

## 2016-05-14 ENCOUNTER — Encounter: Payer: Self-pay | Admitting: Obstetrics & Gynecology

## 2016-05-14 ENCOUNTER — Telehealth: Payer: Self-pay | Admitting: Obstetrics & Gynecology

## 2016-05-14 ENCOUNTER — Telehealth: Payer: Self-pay | Admitting: Cardiology

## 2016-05-14 LAB — HEPATITIS C ANTIBODY: HCV AB: NEGATIVE

## 2016-05-14 NOTE — Telephone Encounter (Signed)
Patient came in to check on the status of her lab results from yesterday.

## 2016-05-14 NOTE — Telephone Encounter (Signed)
Returned call to patient she stated she is presently at [facility] to receive hepatis shot.

## 2016-05-14 NOTE — Telephone Encounter (Signed)
New message          Calling to ask the nurse which hepatitis vaccine she needs.  She cannot find this info on mychart and she discussed this with the nurse in the past

## 2016-05-14 NOTE — Telephone Encounter (Signed)
Spoke with patient. Advised patient that hepatitis c testing has return and is negative. Patient is agreeable and verbalizes understanding.  Routing to [place] for final review.

## 2016-05-19 ENCOUNTER — Telehealth: Payer: Self-pay | Admitting: Cardiology

## 2016-05-19 NOTE — Telephone Encounter (Signed)
Wacissa from [employer] voiced they're needing to know the risk for pts Hep B shot wanting to know if the pts risk is med, med-high, or high.  Please follow up with Margaretha Sheffield. Thanks!

## 2016-05-19 NOTE — Telephone Encounter (Signed)
Returned call and advised to defer this to PCP per notes. Caller voiced understanding and thanks.

## 2016-05-20 DIAGNOSIS — H04121 Dry eye syndrome of right lacrimal gland: Secondary | ICD-10-CM | POA: Diagnosis not present

## 2016-05-20 DIAGNOSIS — H15011 Anterior scleritis, right eye: Secondary | ICD-10-CM | POA: Diagnosis not present

## 2016-05-21 ENCOUNTER — Encounter: Payer: Self-pay | Admitting: Cardiology

## 2016-05-24 ENCOUNTER — Encounter: Payer: Self-pay | Admitting: Cardiology

## 2016-05-25 ENCOUNTER — Encounter: Payer: Self-pay | Admitting: Cardiology

## 2016-05-27 ENCOUNTER — Encounter: Payer: Self-pay | Admitting: Cardiology

## 2016-06-03 ENCOUNTER — Encounter: Payer: Self-pay | Admitting: Cardiology

## 2016-06-03 DIAGNOSIS — Z1231 Encounter for screening mammogram for malignant neoplasm of breast: Secondary | ICD-10-CM | POA: Diagnosis not present

## 2016-06-04 ENCOUNTER — Encounter: Payer: Self-pay | Admitting: Cardiology

## 2016-06-04 ENCOUNTER — Encounter: Payer: Self-pay | Admitting: Obstetrics & Gynecology

## 2016-06-05 ENCOUNTER — Telehealth: Payer: Self-pay | Admitting: *Deleted

## 2016-06-05 NOTE — Telephone Encounter (Signed)
Spoke with patient. Patient re-interated MyChart message as seen below. Patient has no medical complaints at this time. Patient does not feel physically threaten or unsafe. Advised should she have any additional concerns or questions, feel free to contact the office. Patient is agreeable.   Routing to provider for final review. Patient is agreeable to disposition. Will close encounter.

## 2016-06-05 NOTE — Telephone Encounter (Signed)
Left message to call Sharee Pimple at [phone].     From [name] To Megan Salon, MD Sent 06/04/2016 8:29 PM  [name], I am at 'wits end'. 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 There is a family who live at the end of the hall. Micha is so sweet. His stepmother is a Product/process development scientist. Hurley [employer] used to come to see me, and all of a sudden he dropped off. He came for a quick visit on Monday. It seems that The lady who lives across from me told Micha's family that I was a pedephile. I have mentioned this to relatives, and my care givers. Like me, they are stunned. And my feeling have been hurt so much by this comment. Have a good weekend. Lovey Newcomer

## 2016-06-05 NOTE — Telephone Encounter (Signed)
Patient returned call

## 2016-06-10 NOTE — Progress Notes (Signed)
Nicole Hess Date of Birth: [birth year] Medical Record #[id number]  History of Present Illness: Nicole Hess is seen today for follow up s/p AVR. She  has a history of bicuspid aortic valve with aortic stenosis.She underwent AVR with a # 21 mm Edwards Magna-Ease bioprosthetic valve by Dr. Cyndia Bent on 02/26/15. She had an uneventful recovery.  On follow up today she is doing well. No dizziness, chest pain, SOB. Incisions have healed. She is walking regularly.   Current Outpatient Prescriptions on File Prior to Visit  Medication Sig Dispense Refill  . Ascorbic Acid (VITAMIN C) 1000 MG tablet Take 1,000 mg by mouth daily.    Marland Kitchen aspirin 81 MG tablet Take 81 mg by mouth daily.    Marland Kitchen b complex vitamins tablet Take 1 tablet by mouth daily.    . Calcium Carbonate-Vitamin D (CALTRATE 600+D PO) Take 1 tablet by mouth 2 (two) times daily.    Marland Kitchen ketorolac (ACULAR) 0.4 % SOLN Place 1 drop into the right eye 4 (four) times daily.    . Loteprednol Etabonate (LOTEMAX OP) Apply to eye.    . Omega-3 Fatty Acids (OMEGA-3 FISH OIL PO) Take 1 capsule by mouth 2 (two) times daily.     . pravastatin (PRAVACHOL) 80 MG tablet Take 1 tablet (80 mg total) by mouth daily. 30 tablet 6  . primidone (MYSOLINE) 250 MG tablet Take 250 mg by mouth 2 (two) times daily.    . Tretinoin Microsphere (RETIN-A MICRO EX) Apply 1 application topically daily.      No current facility-administered medications on file prior to visit.     Allergies  Allergen Reactions  . Other Other (See Comments)    Patient stated that she can not take any medication with DM in it  . Penicillins Rash    Has patient had a PCN reaction causing immediate rash, facial/tongue/throat swelling, SOB or lightheadedness with hypotension: No Has patient had a PCN reaction causing severe rash involving mucus membranes or skin necrosis: No Has patient had a PCN reaction that required hospitalization No Has patient had a PCN reaction occurring within the last 10  years: No If all of the above answers are "NO", then may proceed with Cephalosporin use.    Past Medical History:  Diagnosis Date  . Abnormal Pap smear   . Anemia   . Anxiety   . Aortic stenosis   . Bicuspid aortic valve   . Coronary artery disease   . Depression   . Gout   . Heart murmur   . Heart valve problem    will have follow up Echo-Dr Leydy Worthey-will have replacement in a couple of years  . Leukopenia    Seen by Dr. Lamonte Sakai, present since 2008, watchful waiting  . Osteopenia   . Seizures ([place])    as a child  . Shortness of breath dyspnea     Past Surgical History:  Procedure Laterality Date  . AORTIC VALVE REPLACEMENT N/A 02/26/2015   Procedure: AORTIC VALVE REPLACEMENT (AVR);  Surgeon: Gaye Pollack, MD;  Location: [place];  Service: Open Heart Surgery;  Laterality: N/A;  . CARDIAC CATHETERIZATION N/A 01/29/2015   Procedure: Right/Left Heart Cath and Coronary Angiography;  Surgeon: Sabrinia Prien M Martinique, MD;  Location: [facility];  Service: Cardiovascular;  Laterality: N/A;  . CERVICAL CONE BIOPSY    . COLONOSCOPY  2010  . DOPPLER ECHOCARDIOGRAPHY  3/14  . GUM SURGERY    . OOPHORECTOMY    . PARTIAL HYSTERECTOMY    .  TEE WITHOUT CARDIOVERSION N/A 02/26/2015   Procedure: TRANSESOPHAGEAL ECHOCARDIOGRAM (TEE);  Surgeon: Gaye Pollack, MD;  Location: [place];  Service: Open Heart Surgery;  Laterality: N/A;  . TUBAL LIGATION    . VAGINAL HYSTERECTOMY     for dysplasia    History  Smoking Status  . Never Smoker  Smokeless Tobacco  . Never Used    History  Alcohol Use  . 4.2 oz/week  . 7 Glasses of wine per week    Comment: 2 glasses of wine/cranberry juice a day    Family History  Problem Relation Age of Onset  . Multiple myeloma Mother   . Stroke Father   . Hypertension Father   . Heart disease Father 62  . Kidney disease Father   . Cancer Father   . Cancer Maternal Grandmother     renal    Review of Systems: The review of systems is as noted in HPI.  All  other systems were reviewed and are negative.  Physical Exam: BP 109/69   Pulse 75   Ht 5' 9"  (1.753 m)   Wt 146 lb 12.8 oz (66.6 kg)   LMP 09/15/1987   SpO2 98%   BMI 21.68 kg/m  She is a pleasant white female in no acute distress. HEENT: Normal Neck: No JVD, adenopathy, thyromegaly, or bruits. Lungs: Clear Cardiovascular: Regular rate and rhythm. Normal S1 and S2. Grade 2/6 harsh  systolic flow murmur heard best at right upper sternal border radiating to the carotids and apex. Abdomen: Soft and nontender. No masses or bruits. Bowel sounds positive. Extremities: No cyanosis or edema. Pulses are 2+ and symmetric. Skin: Warm and dry Neuro: Alert and oriented x3. Cranial nerves II through XII are intact. Is appropriate.  LABORATORY DATA: Lab Results  Component Value Date   WBC 4.3 02/09/2016   HGB 13.0 02/09/2016   HCT 39.1 02/09/2016   PLT 200 02/09/2016   GLUCOSE 86 02/17/2016   CHOL 200 01/15/2015   TRIG 81 01/15/2015   HDL 84 01/15/2015   LDLCALC 100 (H) 01/15/2015   ALT 41 02/22/2015   AST 57 (H) 02/22/2015   NA 141 02/17/2016   K 3.9 02/17/2016   CL 102 02/17/2016   CREATININE 0.71 02/17/2016   BUN 15 02/17/2016   CO2 28 02/17/2016   TSH 1.087 02/09/2016   INR 1.50 (H) 02/26/2015   HGBA1C 5.5 02/22/2015   Labs reviewed dated 11/07/15: cholesterol 245, triglycerides 90, HDL 82, LDL 145.   Echo: 04/24/15 Study Conclusions  - Left ventricle: The cavity size was normal. Systolic function was normal. The estimated ejection fraction was in the range of 60% to 65%. Wall motion was normal; there were no regional wall motion abnormalities. - Aortic valve: A bioprosthesis was present and functioning normally. Peak velocity (S): 250 cm/s. Mean gradient (S): 14 mm Hg. Valve area (VTI): 1.26 cm^2. Valve area (Vmax): 1.23 cm^2. Valve area (Vmean): 1.21 cm^2. - Mitral valve: Calcified annulus. Mildly thickened leaflets . There was mild regurgitation. -  Right ventricle: The cavity size was mildly dilated. Wall thickness was normal.  Assessment / Plan: 1. Severe aortic stenosis with bicuspid aortic valve. S/p AVR. Good clinical recovery.  Will continue ASA and pravastatin. Follow up Echo was satisfactory. I will follow up in 6 months. Continue routine SBE prophylaxis.

## 2016-06-11 ENCOUNTER — Encounter: Payer: Self-pay | Admitting: Cardiology

## 2016-06-11 ENCOUNTER — Ambulatory Visit (INDEPENDENT_AMBULATORY_CARE_PROVIDER_SITE_OTHER): Payer: PPO | Admitting: Cardiology

## 2016-06-11 VITALS — BP 109/69 | HR 75 | Ht 69.0 in | Wt 146.8 lb

## 2016-06-11 DIAGNOSIS — Z952 Presence of prosthetic heart valve: Secondary | ICD-10-CM

## 2016-06-11 DIAGNOSIS — I350 Nonrheumatic aortic (valve) stenosis: Secondary | ICD-10-CM | POA: Diagnosis not present

## 2016-06-11 DIAGNOSIS — E785 Hyperlipidemia, unspecified: Secondary | ICD-10-CM

## 2016-06-11 DIAGNOSIS — Z954 Presence of other heart-valve replacement: Secondary | ICD-10-CM | POA: Diagnosis not present

## 2016-06-11 DIAGNOSIS — Q231 Congenital insufficiency of aortic valve: Secondary | ICD-10-CM | POA: Diagnosis not present

## 2016-06-11 NOTE — Patient Instructions (Addendum)
Continue your current therapy  I will see you in 6 months.   

## 2016-06-14 ENCOUNTER — Encounter: Payer: Self-pay | Admitting: Obstetrics & Gynecology

## 2016-06-15 ENCOUNTER — Telehealth: Payer: Self-pay

## 2016-06-15 NOTE — Progress Notes (Signed)
Note written and edited by [name].  Encounter closed.

## 2016-06-15 NOTE — Telephone Encounter (Signed)
Spoke with patient. 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 Requesting letter be mailed to her verified home address on file. Letter written and to [place] for review and signature before sending.  Non-Urgent Medical Question  Message [id number]  From MARIN VITTONE To Megan Salon, MD Sent 06/14/2016 8:07 PM  [name], I need a note from you saying that I have never been a smoker. I received a notice from the management company relative to my 'smoking'! I have never smoked, and do not have an ash tray in my home! I have mailed you a copy of this info. Thank you. Sandy   Responsible Party   Pool - [facility] Clinical Pool No one has taken responsibility for this message.  No actions have been taken on this message.

## 2016-06-15 NOTE — Telephone Encounter (Signed)
Telephone encounter created to speak with patient regarding letter request. Please see telephone call dated 06/15/2016.

## 2016-06-23 ENCOUNTER — Encounter: Payer: Self-pay | Admitting: Obstetrics & Gynecology

## 2016-06-29 ENCOUNTER — Encounter: Payer: Self-pay | Admitting: Cardiology

## 2016-07-01 ENCOUNTER — Telehealth: Payer: Self-pay | Admitting: Cardiology

## 2016-07-01 ENCOUNTER — Encounter: Payer: Self-pay | Admitting: Cardiology

## 2016-07-01 NOTE — Telephone Encounter (Signed)
New message      What dental office are you calling from?  Dr Claudina Lick What is your office phone and fax number?  Fax [phone] 1. What type of procedure is the patient having performed?  New pt exam----cleaning  2. What date is procedure scheduled?  07-08-16  3. What is your question (ex. Antibiotics prior to procedure, holding medication-we need to know how long dentist wants pt to hold med)?  Pt told dentist that she has had valve surgery.  Need something in writing stating if pt need antibiotics prior to dental treatment.  If she needs an antibiotic, they want [place] to call it in first and they will call in any other antibiotics needed after initial treatment.

## 2016-07-01 NOTE — Telephone Encounter (Signed)
Returned call to Burr at [employer] office.Advised patient will need antibiotics before dental procedures.Advised Dr.Jordan out of office.I will fax letter to your office Friday 07/03/16.

## 2016-07-01 NOTE — Telephone Encounter (Signed)
Ms. Kopinski stopped by and is wondering if Dr. Martinique can contact the office of Dr. Merrilee Jansky [facility]) to see if they will receive her as a patient again. They're on [address]. And the phone number to the location is: [phone].  Pt's ph# [phone] Thank you.

## 2016-07-01 NOTE — Telephone Encounter (Signed)
Returned call to patient.She stated do not call Dr.Kraska.Stated she will be seeing a new dentist Dr.Leonard Deatra Ina.

## 2016-07-03 ENCOUNTER — Encounter: Payer: Self-pay | Admitting: Cardiology

## 2016-07-03 ENCOUNTER — Telehealth: Payer: Self-pay

## 2016-07-03 ENCOUNTER — Other Ambulatory Visit: Payer: Self-pay

## 2016-07-03 MED ORDER — AMOXICILLIN 500 MG PO CAPS: ORAL_CAPSULE | ORAL | 3 refills | Status: DC

## 2016-07-03 NOTE — Telephone Encounter (Signed)
Spoke to patient she stated she took Amoxicillin last year before dental work and she did not have any reaction.Advised I will send refill to pharmacy.I faxed a letter to Dr.Leonard Deatra Ina stating you need antibiotics before dental procedures at fax # [phone].

## 2016-07-05 ENCOUNTER — Encounter: Payer: Self-pay | Admitting: Cardiology

## 2016-07-16 DIAGNOSIS — H04123 Dry eye syndrome of bilateral lacrimal glands: Secondary | ICD-10-CM | POA: Diagnosis not present

## 2016-07-16 DIAGNOSIS — H15011 Anterior scleritis, right eye: Secondary | ICD-10-CM | POA: Diagnosis not present

## 2016-07-21 ENCOUNTER — Encounter: Payer: Self-pay | Admitting: Cardiology

## 2016-07-30 DIAGNOSIS — H2513 Age-related nuclear cataract, bilateral: Secondary | ICD-10-CM | POA: Diagnosis not present

## 2016-07-30 DIAGNOSIS — H04123 Dry eye syndrome of bilateral lacrimal glands: Secondary | ICD-10-CM | POA: Diagnosis not present

## 2016-07-30 DIAGNOSIS — H524 Presbyopia: Secondary | ICD-10-CM | POA: Diagnosis not present

## 2016-07-30 DIAGNOSIS — H15003 Unspecified scleritis, bilateral: Secondary | ICD-10-CM | POA: Diagnosis not present

## 2016-08-15 ENCOUNTER — Encounter: Payer: Self-pay | Admitting: Cardiology

## 2016-08-25 ENCOUNTER — Encounter: Payer: Self-pay | Admitting: Cardiology

## 2016-08-26 ENCOUNTER — Encounter: Payer: Self-pay | Admitting: Cardiology

## 2016-09-09 ENCOUNTER — Encounter: Payer: Self-pay | Admitting: Cardiology

## 2016-09-10 ENCOUNTER — Encounter: Payer: Self-pay | Admitting: Cardiology

## 2016-09-10 ENCOUNTER — Telehealth: Payer: Self-pay | Admitting: Cardiology

## 2016-09-10 NOTE — Telephone Encounter (Signed)
Patient came in today to give [place] the current information for her pneumonia vaccine, which she received at [facility].  She was inquiring about a Shingles shot and I spoke with Charlotte Sanes, RN in the Triage ares.  He states that injection is a one time only injection.  I did a Googlesearch for the Shingles Vaccine ----Vaccine is given once--the patient was informed.  Patient would still like to speak with either Malachy Mood or Dr. Martinique regarding this.

## 2016-09-11 NOTE — Telephone Encounter (Signed)
Returned call to patient.She stated she had shingles vaccine and pneumonia vaccine.Advised I will update her chart.

## 2016-09-12 ENCOUNTER — Encounter: Payer: Self-pay | Admitting: Cardiology

## 2016-09-13 ENCOUNTER — Encounter: Payer: Self-pay | Admitting: Cardiology

## 2016-09-15 ENCOUNTER — Encounter: Payer: Self-pay | Admitting: Cardiology

## 2016-09-15 ENCOUNTER — Encounter: Payer: Self-pay | Admitting: Obstetrics & Gynecology

## 2016-09-17 ENCOUNTER — Encounter: Payer: Self-pay | Admitting: Cardiology

## 2016-10-01 ENCOUNTER — Encounter: Payer: Self-pay | Admitting: Cardiology

## 2016-10-12 ENCOUNTER — Encounter: Payer: Self-pay | Admitting: Cardiology

## 2016-10-14 DIAGNOSIS — M8589 Other specified disorders of bone density and structure, multiple sites: Secondary | ICD-10-CM | POA: Diagnosis not present

## 2016-10-15 DIAGNOSIS — H9311 Tinnitus, right ear: Secondary | ICD-10-CM | POA: Diagnosis not present

## 2016-10-15 DIAGNOSIS — H6121 Impacted cerumen, right ear: Secondary | ICD-10-CM | POA: Diagnosis not present

## 2016-10-15 DIAGNOSIS — Z6822 Body mass index (BMI) 22.0-22.9, adult: Secondary | ICD-10-CM | POA: Diagnosis not present

## 2016-10-16 ENCOUNTER — Encounter: Payer: Self-pay | Admitting: Cardiology

## 2016-10-18 ENCOUNTER — Encounter: Payer: Self-pay | Admitting: Cardiology

## 2016-10-20 ENCOUNTER — Encounter: Payer: Self-pay | Admitting: Cardiology

## 2016-10-21 ENCOUNTER — Encounter: Payer: Self-pay | Admitting: Cardiology

## 2016-10-22 ENCOUNTER — Telehealth: Payer: Self-pay

## 2016-10-22 ENCOUNTER — Encounter: Payer: Self-pay | Admitting: Cardiology

## 2016-10-22 DIAGNOSIS — E871 Hypo-osmolality and hyponatremia: Secondary | ICD-10-CM | POA: Diagnosis not present

## 2016-10-22 LAB — BASIC METABOLIC PANEL
BUN: 21 mg/dL (ref 4–21)
CREATININE: 0.8 mg/dL (ref 0.5–1.1)
POTASSIUM: 4.6 mmol/L (ref 3.4–5.3)
Sodium: 141 mmol/L (ref 137–147)

## 2016-10-22 NOTE — Telephone Encounter (Signed)
Received call from patient.Advised lipid panel from Dr.Patterson's office was done 11/07/15.Advised she will need to have repeat lipid panel.Advised to have results faxed for Dr.Jordan to review.Advised to continue to eat a heart healthy diet low fat low cholesterol.

## 2016-10-23 ENCOUNTER — Encounter: Payer: Self-pay | Admitting: Cardiology

## 2016-10-23 ENCOUNTER — Telehealth: Payer: Self-pay | Admitting: Family Medicine

## 2016-10-23 NOTE — Telephone Encounter (Signed)
Patient would like to establish care due to unsatisfactory experience with prior PCP.   Name: Nicole Hess DOB: 2045/10/03 Phone: [phone]  They heard about [place] through PCP office.  Thank you

## 2016-10-24 ENCOUNTER — Encounter: Payer: Self-pay | Admitting: Cardiology

## 2016-10-25 NOTE — Telephone Encounter (Signed)
I am not taking new pts at this time.  She is able to establish w/ Einar Pheasant who is excellent

## 2016-10-26 ENCOUNTER — Encounter: Payer: Self-pay | Admitting: Cardiology

## 2016-10-26 ENCOUNTER — Encounter: Payer: Self-pay | Admitting: Physician Assistant

## 2016-10-26 ENCOUNTER — Ambulatory Visit (INDEPENDENT_AMBULATORY_CARE_PROVIDER_SITE_OTHER): Payer: PPO | Admitting: Physician Assistant

## 2016-10-26 DIAGNOSIS — G40309 Generalized idiopathic epilepsy and epileptic syndromes, not intractable, without status epilepticus: Secondary | ICD-10-CM

## 2016-10-26 DIAGNOSIS — Q231 Congenital insufficiency of aortic valve: Secondary | ICD-10-CM | POA: Diagnosis not present

## 2016-10-26 DIAGNOSIS — Q230 Congenital stenosis of aortic valve: Secondary | ICD-10-CM

## 2016-10-26 DIAGNOSIS — Z86010 Personal history of colonic polyps: Secondary | ICD-10-CM | POA: Diagnosis not present

## 2016-10-26 DIAGNOSIS — F329 Major depressive disorder, single episode, unspecified: Secondary | ICD-10-CM

## 2016-10-26 DIAGNOSIS — E785 Hyperlipidemia, unspecified: Secondary | ICD-10-CM | POA: Diagnosis not present

## 2016-10-26 DIAGNOSIS — F32A Depression, unspecified: Secondary | ICD-10-CM

## 2016-10-26 NOTE — Progress Notes (Signed)
Patient presents to clinic today to establish care.   Chronic Issues: Aortic Stenosis -- Followed by Cardiology, Dr. Martinique.  Last Echocardiogram 04/24/2015 revealing normal LV function and AV prosthesis functioning normally. Patient denies chest pain, palpitations, lightheadedness, dizziness, vision changes or frequent headaches.  Epilepsy -- 250 mg tablet BID. + history of seizures in childhood. No issue since teenage years. Has been on chronic Primidone since that time, prescribed by previous PCP.  History of Colon Polyps -- Last in 2015. Cecal Polyp. Repeat colonoscopy recommended in 2020.  Depression -- + history. Denies current depressed mood or anhedonia.   Hyperlipidemia -- Has been on Pravachol 80 mg daily for quite some time per patient. Is also on 81 mg ASA daily. Endorses well-balanced diet overall. Is trying to stay active.   Health Maintenance: Immunizations -- up-to-date Colonoscopy -- up-to-date Mammogram -- Last 06/04/16. Normal. Repeat in 1 year. PAP -- Negative PAP 09/16/16. Bone Density -- Last 10/14/16. Dx Low bone mass. Patient on Calcium and Vitamin D supplementation daily.   Past Medical History:  Diagnosis Date  . Abnormal Pap smear   . Anemia   . Anxiety   . Aortic stenosis   . Bicuspid aortic valve   . Coronary artery disease   . Depression   . Gout   . Heart murmur   . Heart valve problem    will have follow up Echo-Dr Jordan-will have replacement in a couple of years  . Leukopenia    Seen by Dr. Lamonte Sakai, present since 2008, watchful waiting  . Osteopenia   . Seizures ([place])    as a child  . Shortness of breath dyspnea     Past Surgical History:  Procedure Laterality Date  . AORTIC VALVE REPLACEMENT N/A 02/26/2015   Procedure: AORTIC VALVE REPLACEMENT (AVR);  Surgeon: Gaye Pollack, MD;  Location: [facility];  Service: Open Heart Surgery;  Laterality: N/A;  . CARDIAC CATHETERIZATION N/A 01/29/2015   Procedure: Right/Left Heart Cath and Coronary  Angiography;  Surgeon: Peter M Martinique, MD;  Location: [facility];  Service: Cardiovascular;  Laterality: N/A;  . CERVICAL CONE BIOPSY    . COLONOSCOPY  2010  . DOPPLER ECHOCARDIOGRAPHY  3/14  . GUM SURGERY    . OOPHORECTOMY    . PARTIAL HYSTERECTOMY    . TEE WITHOUT CARDIOVERSION N/A 02/26/2015   Procedure: TRANSESOPHAGEAL ECHOCARDIOGRAM (TEE);  Surgeon: Gaye Pollack, MD;  Location: [facility];  Service: Open Heart Surgery;  Laterality: N/A;  . TUBAL LIGATION    . VAGINAL HYSTERECTOMY     for dysplasia    Current Outpatient Prescriptions on File Prior to Visit  Medication Sig Dispense Refill  . Ascorbic Acid (VITAMIN C) 1000 MG tablet Take 1,000 mg by mouth daily.    Marland Kitchen aspirin 81 MG tablet Take 81 mg by mouth daily.    . Calcium Carbonate-Vitamin D (CALTRATE 600+D PO) Take 1 tablet by mouth 2 (two) times daily.    . Omega-3 Fatty Acids (OMEGA-3 FISH OIL PO) Take 1 capsule by mouth 2 (two) times daily.      No current facility-administered medications on file prior to visit.     Allergies  Allergen Reactions  . Other Other (See Comments)    Patient stated that she can not take any medication with DM in it  . Penicillins Rash    Has patient had a PCN reaction causing immediate rash, facial/tongue/throat swelling, SOB or lightheadedness with hypotension: No Has patient had a  PCN reaction causing severe rash involving mucus membranes or skin necrosis: No Has patient had a PCN reaction that required hospitalization No Has patient had a PCN reaction occurring within the last 10 years: No If all of the above answers are "NO", then may proceed with Cephalosporin use.    Family History  Problem Relation Age of Onset  . Multiple myeloma Mother   . Stroke Father   . Hypertension Father   . Heart disease Father 33  . Kidney disease Father   . Cancer Father   . Cancer Maternal Grandmother     renal  . Osteoarthritis Brother     Social History   Social History  . Marital  status: Single    Spouse name: N/A  . Number of children: N/A  . Years of education: N/A   Occupational History  . paralegal    Social History Main Topics  . Smoking status: Never Smoker  . Smokeless tobacco: Never Used  . Alcohol use 4.2 oz/week    7 Glasses of wine per week     Comment: 1 glass of wine/cranberry juice a day  . Drug use: No  . Sexual activity: No     Comment: [facility]   Other Topics Concern  . Not on file   Social History Narrative  . No narrative on file   Review of Systems  Constitutional: Negative for fever and malaise/fatigue.  Eyes: Negative for blurred vision and double vision.  Respiratory: Negative for cough and shortness of breath.   Cardiovascular: Negative for chest pain, palpitations and leg swelling.  Gastrointestinal: Negative for abdominal pain, heartburn, nausea and vomiting.  Neurological: Negative for dizziness, loss of consciousness and headaches.  Psychiatric/Behavioral: Negative for depression, hallucinations, memory loss, substance abuse and suicidal ideas. The patient is not nervous/anxious and does not have insomnia.    BP 118/74   Pulse 65   Temp 98.3 F (36.8 C) (Oral)   Resp 14   Ht 5' 8.75" (1.746 m)   Wt 146 lb (66.2 kg)   LMP 09/15/1987   SpO2 98%   BMI 21.72 kg/m   Physical Exam  Constitutional: She is oriented to person, place, and time and well-developed, well-nourished, and in no distress.  HENT:  Head: Normocephalic and atraumatic.  Right Ear: External ear normal.  Left Ear: External ear normal.  Nose: Nose normal.  Mouth/Throat: Oropharynx is clear and moist. No oropharyngeal exudate.  TM within normal limits bilaterally.  Eyes: Conjunctivae are normal.  Neck: Neck supple.  Cardiovascular: Normal rate, regular rhythm, normal heart sounds and intact distal pulses.   Pulmonary/Chest: Effort normal and breath sounds normal. No respiratory distress. She has no wheezes. She has no rales. She exhibits no tenderness.    Lymphadenopathy:    She has no cervical adenopathy.  Neurological: She is alert and oriented to person, place, and time.  Skin: Skin is warm and dry. No rash noted.  Psychiatric: Affect normal.  Vitals reviewed.   Recent Results (from the past 2160 hour(s))  Basic metabolic panel     Status: None   Collection Time: 10/22/16 12:00 AM  Result Value Ref Range   BUN 21 4 - 21 mg/dL   Creatinine 0.8 0.5 - 1.1 mg/dL   Potassium 4.6 3.4 - 5.3 mmol/L   Sodium 141 137 - 147 mmol/L  Urine cytology ancillary only     Status: None   Collection Time: 11/06/16 12:00 AM  Result Value Ref Range   Chlamydia Negative  Comment: Normal Reference Range - Negative   Neisseria gonorrhea Negative     Comment: Normal Reference Range - Negative  HIV antibody     Status: None   Collection Time: 11/06/16 10:22 AM  Result Value Ref Range   HIV 1&2 Ab, 4th Generation NONREACTIVE NONREACTIVE    Comment:   HIV-1 antigen and HIV-1/HIV-2 antibodies were not detected.  There is no laboratory evidence of HIV infection.   HIV-1/2 Antibody Diff        Not indicated. HIV-1 RNA, Qual TMA          Not indicated.     PLEASE NOTE: This information has been disclosed to you from records whose confidentiality may be protected by state law. If your state requires such protection, then the state law prohibits you from making any further disclosure of the information without the specific written consent of the person to whom it pertains, or as otherwise permitted by law. A general authorization for the release of medical or other information is NOT sufficient for this purpose.   The performance of this assay has not been clinically validated in patients less than 75 years old.   For additional information please refer to http://education.questdiagnostics.com/faq/FAQ106.  (This link is being provided for informational/educational purposes only.)     RPR     Status: None   Collection Time: 11/06/16 10:22 AM   Result Value Ref Range   RPR Ser Ql NON REAC NON REAC  CBC     Status: None   Collection Time: 11/06/16 10:22 AM  Result Value Ref Range   WBC 4.1 4.0 - 10.5 K/uL   RBC 4.12 3.87 - 5.11 Mil/uL   Platelets 154.0 150.0 - 400.0 K/uL   Hemoglobin 13.4 12.0 - 15.0 g/dL   HCT 39.9 36.0 - 46.0 %   MCV 96.9 78.0 - 100.0 fl   MCHC 33.5 30.0 - 36.0 g/dL   RDW 13.0 11.5 - 15.5 %  Comprehensive metabolic panel     Status: None   Collection Time: 11/06/16 10:22 AM  Result Value Ref Range   Sodium 140 135 - 145 mEq/L   Potassium 4.0 3.5 - 5.1 mEq/L   Chloride 102 96 - 112 mEq/L   CO2 30 19 - 32 mEq/L   Glucose, Bld 89 70 - 99 mg/dL   BUN 23 6 - 23 mg/dL   Creatinine, Ser 0.83 0.40 - 1.20 mg/dL   Total Bilirubin 0.3 0.2 - 1.2 mg/dL   Alkaline Phosphatase 113 39 - 117 U/L   AST 37 0 - 37 U/L   ALT 27 0 - 35 U/L   Total Protein 7.7 6.0 - 8.3 g/dL   Albumin 4.5 3.5 - 5.2 g/dL   Calcium 9.4 8.4 - 10.5 mg/dL   GFR 72.47 >60.00 mL/min  Lipid panel     Status: Abnormal   Collection Time: 11/06/16 10:22 AM  Result Value Ref Range   Cholesterol 194 0 - 200 mg/dL    Comment: ATP III Classification       Desirable:  < 200 mg/dL               Borderline High:  200 - 239 mg/dL          High:  > = 240 mg/dL   Triglycerides 59.0 0.0 - 149.0 mg/dL    Comment: Normal:  <150 mg/dLBorderline High:  150 - 199 mg/dL   HDL 80.10 >39.00 mg/dL   VLDL 11.8 0.0 - 40.0 mg/dL  LDL Cholesterol 102 (H) 0 - 99 mg/dL   Total CHOL/HDL Ratio 2     Comment:                Men          Women1/2 Average Risk     3.4          3.3Average Risk          5.0          4.42X Average Risk          9.6          7.13X Average Risk          15.0          11.0                       NonHDL 114.18     Comment: NOTE:  Non-HDL goal should be 30 mg/dL higher than patient's LDL goal (i.e. LDL goal of < 70 mg/dL, would have non-HDL goal of < 100 mg/dL)  TSH     Status: None   Collection Time: 11/06/16 10:22 AM  Result Value Ref Range    TSH 1.11 0.35 - 4.50 uIU/mL  Urinalysis, Routine w reflex microscopic     Status: None   Collection Time: 11/06/16 10:22 AM  Result Value Ref Range   Color, Urine YELLOW Yellow;Lt. Yellow   APPearance CLEAR Clear   Specific Gravity, Urine 1.010 1.000 - 1.030   pH 6.0 5.0 - 8.0   Total Protein, Urine NEGATIVE Negative   Urine Glucose NEGATIVE Negative   Ketones, ur NEGATIVE Negative   Bilirubin Urine NEGATIVE Negative   Hgb urine dipstick NEGATIVE Negative   Urobilinogen, UA 0.2 0.0 - 1.0   Leukocytes, UA NEGATIVE Negative   Nitrite NEGATIVE Negative   WBC, UA 0-2/hpf 0-2/hpf   RBC / HPF none seen 0-2/hpf  Hepatic function panel     Status: None   Collection Time: 11/09/16  8:04 AM  Result Value Ref Range   Total Bilirubin 0.3 0.2 - 1.2 mg/dL   Bilirubin, Direct 0.1 <=0.2 mg/dL   Indirect Bilirubin 0.2 0.2 - 1.2 mg/dL   Alkaline Phosphatase 110 33 - 130 U/L   AST 32 10 - 35 U/L   ALT 24 6 - 29 U/L   Total Protein 7.2 6.1 - 8.1 g/dL   Albumin 4.2 3.6 - 5.1 g/dL  Lipid panel     Status: None   Collection Time: 11/09/16  8:04 AM  Result Value Ref Range   Cholesterol 189 <200 mg/dL   Triglycerides 100 <150 mg/dL   HDL 76 >50 mg/dL   Total CHOL/HDL Ratio 2.5 <5.0 Ratio   VLDL 20 <30 mg/dL   LDL Cholesterol 93 <100 mg/dL   Assessment/Plan: Aortic stenosis due to bicuspid aortic valve Followed by Cardiology. Up-to-date on F/U and imaging.  Asymptomatic. F/u with Cardiology as scheduled.  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 No seizure in decades. Has been on Primidone for a long time. Tolerating well. Will refill medication. Discussed with patient need for repeat Neurology assessment and mediation may not be needed. She will give some thought to this so and get back to [place].   History of colonic polyps Colonoscopy up-to-date. Repeat due in 2020. Asymptomatic.   Hyperlipidemia Continue statin and 81 mg ASA. Diet and exercise recommendations given. Patient  scheduled for CPE so repeat fasting lipids will be assessed at that time.  Depression + history. Asymptomatic at present. PHQ-9 with score of 0. Will monitor.    Leeanne Rio, PA-C

## 2016-10-26 NOTE — Progress Notes (Signed)
Pre visit review using our clinic review tool, if applicable. No additional management support is needed unless otherwise documented below in the visit note. 

## 2016-10-26 NOTE — Telephone Encounter (Signed)
Pt has been scheduled with Cody °

## 2016-10-26 NOTE — Patient Instructions (Signed)
I have gotten your recent lab results from Dr. Sharlett Iles. They look good. I will get your full records and we will determine what else needs to be done at present and when to schedule follow-up.  You will be contacted for your Bone Density Scan.  Please follow-up with Dr. Martinique and [name] as scheduled. Continue medications as directed.

## 2016-10-27 ENCOUNTER — Encounter: Payer: Self-pay | Admitting: Cardiology

## 2016-10-27 ENCOUNTER — Encounter: Payer: Self-pay | Admitting: Physician Assistant

## 2016-10-28 ENCOUNTER — Encounter: Payer: Self-pay | Admitting: Physician Assistant

## 2016-10-28 ENCOUNTER — Encounter: Payer: Self-pay | Admitting: Cardiology

## 2016-10-30 ENCOUNTER — Encounter: Payer: Self-pay | Admitting: Cardiology

## 2016-10-31 ENCOUNTER — Encounter: Payer: Self-pay | Admitting: Physician Assistant

## 2016-10-31 ENCOUNTER — Encounter: Payer: Self-pay | Admitting: Cardiology

## 2016-11-02 ENCOUNTER — Encounter: Payer: Self-pay | Admitting: Physician Assistant

## 2016-11-02 ENCOUNTER — Other Ambulatory Visit: Payer: Self-pay | Admitting: Emergency Medicine

## 2016-11-02 ENCOUNTER — Encounter: Payer: Self-pay | Admitting: Cardiology

## 2016-11-02 MED ORDER — PRIMIDONE 250 MG PO TABS: 250.0000 mg | ORAL_TABLET | Freq: Two times a day (BID) | ORAL | 3 refills | Status: DC

## 2016-11-02 MED ORDER — PRAVASTATIN SODIUM 80 MG PO TABS: 80.0000 mg | ORAL_TABLET | Freq: Every day | ORAL | 3 refills | Status: DC

## 2016-11-04 ENCOUNTER — Other Ambulatory Visit: Payer: Self-pay

## 2016-11-04 ENCOUNTER — Encounter: Payer: Self-pay | Admitting: Physician Assistant

## 2016-11-04 ENCOUNTER — Encounter: Payer: Self-pay | Admitting: Obstetrics & Gynecology

## 2016-11-04 ENCOUNTER — Encounter: Payer: Self-pay | Admitting: Cardiology

## 2016-11-04 ENCOUNTER — Telehealth: Payer: Self-pay | Admitting: Cardiology

## 2016-11-04 DIAGNOSIS — E785 Hyperlipidemia, unspecified: Secondary | ICD-10-CM

## 2016-11-04 NOTE — Telephone Encounter (Signed)
Received records from [facility] for appointment on 11/16/16 with Dr Martinique.  Records put with Dr Doug Sou schedule on 11/16/16. lp

## 2016-11-05 ENCOUNTER — Encounter: Payer: Self-pay | Admitting: Cardiology

## 2016-11-05 ENCOUNTER — Encounter: Payer: Self-pay | Admitting: Physician Assistant

## 2016-11-05 ENCOUNTER — Telehealth: Payer: Self-pay | Admitting: *Deleted

## 2016-11-05 NOTE — Telephone Encounter (Signed)
See telephone encounter dated 11/05/16.

## 2016-11-05 NOTE — Telephone Encounter (Signed)
Spoke with patient in regards to MyChart message as seen below. Patient states she just wanted [name] to "be in the loop" of things since she was her GYN. Patient states she has changed pcp to Raiford Noble, [place] [facility]. Patient states she believes intercourse caused increased pressure on rectum and led to constipation. Patient states pcp and cardiologist recommended Metamucil daily, which she reports starting 2/21. Patient states she had small BM this morning and does not feel constipated. Patient denies any GYN or urinary complaints at this time. Recommended continuing Metamucil as advised, increase fluids and return call for any additional questions. Patient is agreeable.  Routing to provider for final review. Patient is agreeable to disposition. Will close encounter.    From [name] To Megan Salon, MD Sent 11/04/2016 7:47 PM  [name], I started seeing a gentleman who presently lives elsewhere. He was my banker here at one time, and we have known each other for approx. 7 years. Given this, he visited last week. My bottom is extremely raw, and I am having a hard time having a BM. What should I do? I am eating prunes, etc., hoping this will work. Nicole Hess

## 2016-11-06 ENCOUNTER — Encounter: Payer: Self-pay | Admitting: Physician Assistant

## 2016-11-06 ENCOUNTER — Other Ambulatory Visit (HOSPITAL_COMMUNITY)
Admission: RE | Admit: 2016-11-06 | Discharge: 2016-11-06 | Disposition: A | Discharge status: Home / Self Care | Payer: PPO | Source: Ambulatory Visit | Attending: Physician Assistant | Admitting: Physician Assistant

## 2016-11-06 ENCOUNTER — Telehealth: Payer: Self-pay

## 2016-11-06 ENCOUNTER — Encounter: Payer: Self-pay | Admitting: Cardiology

## 2016-11-06 ENCOUNTER — Ambulatory Visit (INDEPENDENT_AMBULATORY_CARE_PROVIDER_SITE_OTHER): Payer: PPO | Admitting: Physician Assistant

## 2016-11-06 VITALS — BP 122/64 | HR 61 | Temp 98.7°F | Resp 14 | Ht 68.75 in | Wt 145.0 lb

## 2016-11-06 DIAGNOSIS — Z113 Encounter for screening for infections with a predominantly sexual mode of transmission: Secondary | ICD-10-CM | POA: Diagnosis not present

## 2016-11-06 DIAGNOSIS — Z0000 Encounter for general adult medical examination without abnormal findings: Secondary | ICD-10-CM | POA: Diagnosis not present

## 2016-11-06 DIAGNOSIS — K5900 Constipation, unspecified: Secondary | ICD-10-CM

## 2016-11-06 LAB — CBC
HCT: 39.9 % (ref 36.0–46.0)
HEMOGLOBIN: 13.4 g/dL (ref 12.0–15.0)
MCHC: 33.5 g/dL (ref 30.0–36.0)
MCV: 96.9 fl (ref 78.0–100.0)
Platelets: 154 10*3/uL (ref 150.0–400.0)
RBC: 4.12 Mil/uL (ref 3.87–5.11)
RDW: 13 % (ref 11.5–15.5)
WBC: 4.1 10*3/uL (ref 4.0–10.5)

## 2016-11-06 LAB — URINALYSIS, ROUTINE W REFLEX MICROSCOPIC
Bilirubin Urine: NEGATIVE
Hgb urine dipstick: NEGATIVE
Ketones, ur: NEGATIVE
Leukocytes, UA: NEGATIVE
Nitrite: NEGATIVE
PH: 6 (ref 5.0–8.0)
RBC / HPF: NONE SEEN (ref 0–?)
SPECIFIC GRAVITY, URINE: 1.01 (ref 1.000–1.030)
TOTAL PROTEIN, URINE-UPE24: NEGATIVE
Urine Glucose: NEGATIVE
Urobilinogen, UA: 0.2 (ref 0.0–1.0)

## 2016-11-06 LAB — COMPREHENSIVE METABOLIC PANEL
ALT: 27 U/L (ref 0–35)
AST: 37 U/L (ref 0–37)
Albumin: 4.5 g/dL (ref 3.5–5.2)
Alkaline Phosphatase: 113 U/L (ref 39–117)
BILIRUBIN TOTAL: 0.3 mg/dL (ref 0.2–1.2)
BUN: 23 mg/dL (ref 6–23)
CO2: 30 mEq/L (ref 19–32)
Calcium: 9.4 mg/dL (ref 8.4–10.5)
Chloride: 102 mEq/L (ref 96–112)
Creatinine, Ser: 0.83 mg/dL (ref 0.40–1.20)
GFR: 72.47 mL/min (ref >60.00)
GLUCOSE: 89 mg/dL (ref 70–99)
POTASSIUM: 4 meq/L (ref 3.5–5.1)
Sodium: 140 mEq/L (ref 135–145)
Total Protein: 7.7 g/dL (ref 6.0–8.3)

## 2016-11-06 LAB — LIPID PANEL
Cholesterol: 194 mg/dL (ref 0–200)
HDL: 80.1 mg/dL (ref >39.00)
LDL CALC: 102 mg/dL — AB (ref 0–99)
NONHDL: 114.18
Total CHOL/HDL Ratio: 2
Triglycerides: 59 mg/dL (ref 0.0–149.0)
VLDL: 11.8 mg/dL (ref 0.0–40.0)

## 2016-11-06 LAB — TSH: TSH: 1.11 u[IU]/mL (ref 0.35–4.50)

## 2016-11-06 NOTE — Patient Instructions (Signed)
Please go to the lab for blood work.   Our office will call you with your results unless you have chosen to receive results via MyChart.  If your blood work is normal we will follow-up each year for physicals and as scheduled for chronic medical problems.  If anything is abnormal we will treat accordingly and get you in for a follow-up.  I encourage you to increase hydration and the amount of fiber in your diet.  Start a daily probiotic (Align, Culturelle, Digestive Advantage, etc.). If no bowel movement within 24 hours, take 2 Tbs of Milk of Magnesia in a 4 oz glass of warmed prune juice every 2-3 days to help promote bowel movement. If no results within 24 hours, then repeat above regimen, adding a Dulcolax stool softener to regimen. If this does not promote a bowel movement, please call the office.   Preventive Care 69 Years and Older, Female Preventive care refers to lifestyle choices and visits with your health care provider that can promote health and wellness. What does preventive care include?  A yearly physical exam. This is also called an annual well check.  Dental exams once or twice a year.  Routine eye exams. Ask your health care provider how often you should have your eyes checked.  Personal lifestyle choices, including:  Daily care of your teeth and gums.  Regular physical activity.  Eating a healthy diet.  Avoiding tobacco and drug use.  Limiting alcohol use.  Practicing safe sex.  Taking low-dose aspirin every day.  Taking vitamin and mineral supplements as recommended by your health care provider. What happens during an annual well check? The services and screenings done by your health care provider during your annual well check will depend on your age, overall health, lifestyle risk factors, and family history of disease. Counseling  Your health care provider may ask you questions about your:  Alcohol use.  Tobacco use.  Drug use.  Emotional  well-being.  Home and relationship well-being.  Sexual activity.  Eating habits.  History of falls.  Memory and ability to understand (cognition).  Work and work Statistician.  Reproductive health. Screening  You may have the following tests or measurements:  Height, weight, and BMI.  Blood pressure.  Lipid and cholesterol levels. These may be checked every 5 years, or more frequently if you are over 69 years old.  Skin check.  Lung cancer screening. You may have this screening every year starting at age 69 if you have a 30-pack-year history of smoking and currently smoke or have quit within the past 15 years.  Fecal occult blood test (FOBT) of the stool. You may have this test every year starting at age 69.  Flexible sigmoidoscopy or colonoscopy. You may have a sigmoidoscopy every 5 years or a colonoscopy every 10 years starting at age 69.  Hepatitis C blood test.  Hepatitis B blood test.  Sexually transmitted disease (STD) testing.  Diabetes screening. This is done by checking your blood sugar (glucose) after you have not eaten for a while (fasting). You may have this done every 1-3 years.  Bone density scan. This is done to screen for osteoporosis. You may have this done starting at age 69.  Mammogram. This may be done every 1-2 years. Talk to your health care provider about how often you should have regular mammograms. Talk with your health care provider about your test results, treatment options, and if necessary, the need for more tests. Vaccines  Your health care  provider may recommend certain vaccines, such as:  Influenza vaccine. This is recommended every year.  Tetanus, diphtheria, and acellular pertussis (Tdap, Td) vaccine. You may need a Td booster every 10 years.  Varicella vaccine. You may need this if you have not been vaccinated.  Zoster vaccine. You may need this after age 69.  Measles, mumps, and rubella (MMR) vaccine. You may need at least one  dose of MMR if you were born in 1957 or later. You may also need a second dose.  Pneumococcal 13-valent conjugate (PCV13) vaccine. One dose is recommended after age 18.  Pneumococcal polysaccharide (PPSV23) vaccine. One dose is recommended after age 69.  Meningococcal vaccine. You may need this if you have certain conditions.  Hepatitis A vaccine. You may need this if you have certain conditions or if you travel or work in places where you may be exposed to hepatitis A.  Hepatitis B vaccine. You may need this if you have certain conditions or if you travel or work in places where you may be exposed to hepatitis B.  Haemophilus influenzae type b (Hib) vaccine. You may need this if you have certain conditions. Talk to your health care provider about which screenings and vaccines you need and how often you need them. This information is not intended to replace advice given to you by your health care provider. Make sure you discuss any questions you have with your health care provider. Document Released: 09/27/2015 Document Revised: 05/20/2016 Document Reviewed: 07/02/2015 Elsevier Interactive Patient Education  2017 [employer].

## 2016-11-06 NOTE — Progress Notes (Signed)
Pre visit review using our clinic review tool, if applicable. No additional management support is needed unless otherwise documented below in the visit note. 

## 2016-11-06 NOTE — Progress Notes (Signed)
Subjective:   Nicole Hess is a 71 y.o. female who presents for Medicare Annual (Subsequent) preventive examination.  Review of Systems:  Review of Systems  Constitutional: Negative for fever and weight loss.  HENT: Negative for ear discharge, ear pain, hearing loss and tinnitus.   Eyes: Negative for blurred vision, double vision, photophobia and pain.  Respiratory: Negative for cough and shortness of breath.   Cardiovascular: Negative for chest pain and palpitations.  Gastrointestinal: Positive for constipation. Negative for abdominal pain, blood in stool, diarrhea, heartburn, melena, nausea and vomiting.  Genitourinary: Negative for dysuria, flank pain, frequency, hematuria and urgency.  Musculoskeletal: Negative for falls.  Neurological: Negative for dizziness, loss of consciousness and headaches.  Endo/Heme/Allergies: Negative for environmental allergies.  Psychiatric/Behavioral: Negative for depression, hallucinations, substance abuse and suicidal ideas. The patient is not nervous/anxious and does not have insomnia.    Patient did endorse some constipation over the past couple of weeks. States she has been staying well hydrated. Denies change in urinary habits. Denies abdominal pain, anorexia, nausea, vomiting. States she will have a bowel movement about once every 2-3 days. States she will get out some liquid and formed stool but does not feel like she completely empties the rectal vault. Patient states symptoms started after a "gentleman friend" suggested she try inserting her fingers in the anus to provider pleasure to help prepare for upcoming potential anal intercourse. Denies any sexual activity at present. Denies trauma or injury to the anus during digital insertion.   Objective:    Vitals: BP 122/64   Pulse 61   Temp 98.7 F (37.1 C) (Oral)   Resp 14   Ht 5' 8.75" (1.746 m)   Wt 145 lb (65.8 kg)   LMP 09/15/1987   SpO2 98%   BMI 21.57 kg/m   Body mass index is  21.57 kg/m.  Tobacco History  Smoking Status  . Never Smoker  Smokeless Tobacco  . Never Used     Counseling given: Not Answered   Past Medical History:  Diagnosis Date  . Abnormal Pap smear   . Anemia   . Anxiety   . Aortic stenosis   . Bicuspid aortic valve   . Coronary artery disease   . Depression   . Gout   . Heart murmur   . Heart valve problem    will have follow up Echo-Dr Jordan-will have replacement in a couple of years  . Leukopenia    Seen by Dr. Lamonte Sakai, present since 2008, watchful waiting  . Osteopenia   . Seizures ([place])    as a child  . Shortness of breath dyspnea    Past Surgical History:  Procedure Laterality Date  . AORTIC VALVE REPLACEMENT N/A 02/26/2015   Procedure: AORTIC VALVE REPLACEMENT (AVR);  Surgeon: Gaye Pollack, MD;  Location: [facility];  Service: Open Heart Surgery;  Laterality: N/A;  . CARDIAC CATHETERIZATION N/A 01/29/2015   Procedure: Right/Left Heart Cath and Coronary Angiography;  Surgeon: Peter M Martinique, MD;  Location: [facility];  Service: Cardiovascular;  Laterality: N/A;  . CERVICAL CONE BIOPSY    . COLONOSCOPY  2010  . DOPPLER ECHOCARDIOGRAPHY  3/14  . GUM SURGERY    . OOPHORECTOMY    . PARTIAL HYSTERECTOMY    . TEE WITHOUT CARDIOVERSION N/A 02/26/2015   Procedure: TRANSESOPHAGEAL ECHOCARDIOGRAM (TEE);  Surgeon: Gaye Pollack, MD;  Location: [facility];  Service: Open Heart Surgery;  Laterality: N/A;  . TUBAL LIGATION    .  VAGINAL HYSTERECTOMY     for dysplasia   Family History  Problem Relation Age of Onset  . Multiple myeloma Mother   . Stroke Father   . Hypertension Father   . Heart disease Father 43  . Kidney disease Father   . Cancer Father   . Cancer Maternal Grandmother     renal  . Osteoarthritis Brother    History  Sexual Activity  . Sexual activity: No    Comment: [facility]    Outpatient Encounter Prescriptions as of 11/06/2016  Medication Sig  . Ascorbic Acid (VITAMIN C) 1000 MG tablet Take 1,000 mg by mouth  daily.  Marland Kitchen aspirin 81 MG tablet Take 81 mg by mouth daily.  . Calcium Carbonate-Vitamin D (CALTRATE 600+D PO) Take 1 tablet by mouth 2 (two) times daily.  . Multiple Vitamins-Minerals (MULTIVITAMIN WOMEN) TABS Take 1 tablet by mouth daily.  . Omega-3 Fatty Acids (OMEGA-3 FISH OIL PO) Take 1 capsule by mouth 2 (two) times daily.   . pravastatin (PRAVACHOL) 80 MG tablet Take 1 tablet (80 mg total) by mouth daily.  . primidone (MYSOLINE) 250 MG tablet Take 1 tablet (250 mg total) by mouth 2 (two) times daily.   No facility-administered encounter medications on file as of 11/06/2016.     Activities of Daily Living In your present state of health, do you have any difficulty performing the following activities: 11/06/2016 10/26/2016  Hearing? N N  Vision? N N  Difficulty concentrating or making decisions? N N  Walking or climbing stairs? N N  Dressing or bathing? N N  Doing errands, shopping? N N  Preparing Food and eating ? N -  Using the Toilet? N -  In the past six months, have you accidently leaked urine? N -  Do you have problems with loss of bowel control? Y -  Managing your Medications? N -  Managing your Finances? N -  Housekeeping or managing your Housekeeping? N -  Some recent data might be hidden    Patient Care Team: Brunetta Jeans, PA-C as PCP - General (Family Medicine) Peter M Martinique, MD as Consulting Physician (Cardiology)    Assessment:    (1) Medicare Wellness, Subsequent (2) CPE (3) Constipation  Exercise Activities and Dietary recommendations Current Exercise Habits: Home exercise routine, Type of exercise: walking, Time (Minutes): 45, Frequency (Times/Week): 3, Weekly Exercise (Minutes/Week): 135, Intensity: Mild  Goals    None     Fall Risk Fall Risk  11/06/2016 11/06/2016 10/26/2016  Falls in the past year? No No No   Depression Screen PHQ 2/9 Scores 11/06/2016 11/06/2016 10/26/2016  PHQ - 2 Score 0 0 0  PHQ- 9 Score - - 0     Cognitive Function MMSE  - Mini Mental State Exam 11/19/2016  Orientation to time 5  Orientation to Place 5  Registration 3  Attention/ Calculation 5  Recall 2  Language- name 2 objects 2  Language- repeat 1  Language- follow 3 step command 3  Language- read & follow direction 1  Write a sentence 1  Copy design 1  Total score 29    Immunization History  Administered Date(s) Administered  . Hepatitis A 05/14/2016, 06/18/2016  . Hepatitis B 05/14/2016, 06/18/2016  . Hepatitis B, adult 11/12/2016  . Influenza-Unspecified 05/12/2016  . Pneumococcal Conjugate-13 09/10/2016  . Tdap 04/22/2015  . Zoster 09/10/2016   Screening Tests Health Maintenance  Topic Date Due  . PNA vac Low Risk Adult (2 of 2 - PPSV23) 09/10/2017  .  MAMMOGRAM  10/15/2018  . COLONOSCOPY  10/24/2023  . DTaP/Tdap/Td (2 - Td) 04/21/2025  . TETANUS/TDAP  04/21/2025  . INFLUENZA VACCINE  Completed  . DEXA SCAN  Completed  . Hepatitis C Screening  Completed      Plan:   (1)  During the course of the visit the patient was educated and counseled about the following appropriate screening and preventive services:   Vaccines to include Pneumoccal, Influenza, Hepatitis B, Td, Zostavax, HCV  Electrocardiogram  Cardiovascular Disease  Colorectal cancer screening  Bone density screening  Diabetes screening  Glaucoma screening  Mammography/PAP  Nutrition counseling   (2) Depression screen negative. Health Maintenance reviewed. Preventive schedule discussed and handout given in AVS. Will obtain fasting labs today.  (3) Worsened due to digital insertion contributing to mild impaction. Bowel regimen reviewed with patient. Giving history had long discussion with patient about safe sexual practices, especially with a new partner, regardless of age. Denies concern for STI but has never been tested. Agrees to testing today to get a baseline so she can give her partner a clean bill of health.  Patient Instructions (the written plan)  was given to the patient.   Raiford Noble [place], [place]  11/19/2016

## 2016-11-06 NOTE — Telephone Encounter (Signed)
Called patient no answer.Unable to leave message no voice mail. 

## 2016-11-07 LAB — HIV ANTIBODY (ROUTINE TESTING W REFLEX): HIV 1&2 Ab, 4th Generation: NONREACTIVE

## 2016-11-07 LAB — RPR

## 2016-11-08 ENCOUNTER — Encounter: Payer: Self-pay | Admitting: Cardiology

## 2016-11-08 ENCOUNTER — Encounter: Payer: Self-pay | Admitting: Physician Assistant

## 2016-11-09 ENCOUNTER — Encounter: Payer: Self-pay | Admitting: Physician Assistant

## 2016-11-09 ENCOUNTER — Encounter: Payer: Self-pay | Admitting: Cardiology

## 2016-11-09 DIAGNOSIS — Z860100 Personal history of colon polyps, unspecified: Secondary | ICD-10-CM | POA: Insufficient documentation

## 2016-11-09 DIAGNOSIS — E785 Hyperlipidemia, unspecified: Secondary | ICD-10-CM | POA: Diagnosis not present

## 2016-11-09 DIAGNOSIS — G40309 Generalized idiopathic epilepsy and epileptic syndromes, not intractable, without status epilepticus: Secondary | ICD-10-CM | POA: Insufficient documentation

## 2016-11-09 DIAGNOSIS — M1611 Unilateral primary osteoarthritis, right hip: Secondary | ICD-10-CM | POA: Insufficient documentation

## 2016-11-09 LAB — HEPATIC FUNCTION PANEL
ALBUMIN: 4.2 g/dL (ref 3.6–5.1)
ALT: 24 U/L (ref 6–29)
AST: 32 U/L (ref 10–35)
Alkaline Phosphatase: 110 U/L (ref 33–130)
BILIRUBIN DIRECT: 0.1 mg/dL (ref <=0.2)
BILIRUBIN TOTAL: 0.3 mg/dL (ref 0.2–1.2)
Indirect Bilirubin: 0.2 mg/dL (ref 0.2–1.2)
Total Protein: 7.2 g/dL (ref 6.1–8.1)

## 2016-11-09 LAB — LIPID PANEL
CHOLESTEROL: 189 mg/dL (ref <200)
HDL: 76 mg/dL (ref >50)
LDL Cholesterol: 93 mg/dL (ref <100)
TRIGLYCERIDES: 100 mg/dL (ref <150)
Total CHOL/HDL Ratio: 2.5 Ratio (ref <5.0)
VLDL: 20 mg/dL (ref <30)

## 2016-11-09 LAB — URINE CYTOLOGY ANCILLARY ONLY
CHLAMYDIA, DNA PROBE: NEGATIVE
NEISSERIA GONORRHEA: NEGATIVE

## 2016-11-10 ENCOUNTER — Encounter: Payer: Self-pay | Admitting: Cardiology

## 2016-11-10 ENCOUNTER — Telehealth: Payer: Self-pay

## 2016-11-10 ENCOUNTER — Encounter: Payer: Self-pay | Admitting: Physician Assistant

## 2016-11-10 NOTE — Telephone Encounter (Signed)
Called patient lab results given.Advised I will mail letter you requested stating you do not smoke.I received paperwork you brought to office last week from [facility] stating you need to have lab work.Stated she has a new PCP and will be having future lab with PCP and our office.Stated she is doing good no chest pain,no sob.Patient reassured to continue her heart healthy life style.Advised to keep appointment as planned with Dr.Jordan 11/16/16 at 9:00 am.

## 2016-11-11 ENCOUNTER — Encounter: Payer: Self-pay | Admitting: Physician Assistant

## 2016-11-11 ENCOUNTER — Encounter: Payer: Self-pay | Admitting: Cardiology

## 2016-11-11 NOTE — Telephone Encounter (Signed)
Pt informed that she is getting the 3rd Hep b vaccination that she requested.

## 2016-11-12 ENCOUNTER — Ambulatory Visit (INDEPENDENT_AMBULATORY_CARE_PROVIDER_SITE_OTHER): Payer: PPO | Admitting: *Deleted

## 2016-11-12 DIAGNOSIS — Z23 Encounter for immunization: Secondary | ICD-10-CM | POA: Diagnosis not present

## 2016-11-13 ENCOUNTER — Encounter: Payer: Self-pay | Admitting: Cardiology

## 2016-11-13 ENCOUNTER — Encounter: Payer: Self-pay | Admitting: Physician Assistant

## 2016-11-13 NOTE — Progress Notes (Signed)
[name] Date of Birth: 04-04-1946 Medical Record #[id number]  History of Present Illness: Nicole Hess is seen today for follow up s/p AVR. She  has a history of bicuspid aortic valve with aortic stenosis.She underwent AVR with a # 21 mm Edwards Magna-Ease bioprosthetic valve by Dr. Cyndia Bent on 02/26/15. She had an uneventful recovery.  On follow up today she is doing well. No dizziness, chest pain, SOB.  She is walking regularly when the weather is good. Really no questions or problems to report today.   Current Outpatient Prescriptions on File Prior to Visit  Medication Sig Dispense Refill  . Ascorbic Acid (VITAMIN C) 1000 MG tablet Take 1,000 mg by mouth daily.    Marland Kitchen aspirin 81 MG tablet Take 81 mg by mouth daily.    . Calcium Carbonate-Vitamin D (CALTRATE 600+D PO) Take 1 tablet by mouth 2 (two) times daily.    . Multiple Vitamins-Minerals (MULTIVITAMIN WOMEN) TABS Take 1 tablet by mouth daily.    . Omega-3 Fatty Acids (OMEGA-3 FISH OIL PO) Take 1 capsule by mouth 2 (two) times daily.     . pravastatin (PRAVACHOL) 80 MG tablet Take 1 tablet (80 mg total) by mouth daily. 30 tablet 3  . primidone (MYSOLINE) 250 MG tablet Take 1 tablet (250 mg total) by mouth 2 (two) times daily. 60 tablet 3   No current facility-administered medications on file prior to visit.     Allergies  Allergen Reactions  . Other Other (See Comments)    Patient stated that she can not take any medication with DM in it  . Penicillins Rash    Has patient had a PCN reaction causing immediate rash, facial/tongue/throat swelling, SOB or lightheadedness with hypotension: No Has patient had a PCN reaction causing severe rash involving mucus membranes or skin necrosis: No Has patient had a PCN reaction that required hospitalization No Has patient had a PCN reaction occurring within the last 10 years: No If all of the above answers are "NO", then may proceed with Cephalosporin use.    Past Medical History:    Diagnosis Date  . Abnormal Pap smear   . Anemia   . Anxiety   . Aortic stenosis   . Bicuspid aortic valve   . Coronary artery disease   . Depression   . Gout   . Heart murmur   . Heart valve problem    will have follow up Echo-Dr Jordan-will have replacement in a couple of years  . Leukopenia    Seen by Dr. Lamonte Sakai, present since 2008, watchful waiting  . Osteopenia   . Seizures ([place])    as a child  . Shortness of breath dyspnea     Past Surgical History:  Procedure Laterality Date  . AORTIC VALVE REPLACEMENT N/A 02/26/2015   Procedure: AORTIC VALVE REPLACEMENT (AVR);  Surgeon: Gaye Pollack, MD;  Location: [facility];  Service: Open Heart Surgery;  Laterality: N/A;  . CARDIAC CATHETERIZATION N/A 01/29/2015   Procedure: Right/Left Heart Cath and Coronary Angiography;  Surgeon: Peter M Martinique, MD;  Location: [facility];  Service: Cardiovascular;  Laterality: N/A;  . CERVICAL CONE BIOPSY    . COLONOSCOPY  2010  . DOPPLER ECHOCARDIOGRAPHY  3/14  . GUM SURGERY    . OOPHORECTOMY    . PARTIAL HYSTERECTOMY    . TEE WITHOUT CARDIOVERSION N/A 02/26/2015   Procedure: TRANSESOPHAGEAL ECHOCARDIOGRAM (TEE);  Surgeon: Gaye Pollack, MD;  Location: [facility];  Service: Open Heart Surgery;  Laterality: N/A;  . TUBAL LIGATION    . VAGINAL HYSTERECTOMY     for dysplasia    History  Smoking Status  . Never Smoker  Smokeless Tobacco  . Never Used    History  Alcohol Use  . 4.2 oz/week  . 7 Glasses of wine per week    Comment: 1 glass of wine/cranberry juice a day    Family History  Problem Relation Age of Onset  . Multiple myeloma Mother   . Stroke Father   . Hypertension Father   . Heart disease Father 75  . Kidney disease Father   . Cancer Father   . Cancer Maternal Grandmother     renal  . Osteoarthritis Brother     Review of Systems: The review of systems is as noted in HPI.  All other systems were reviewed and are negative.  Physical Exam: BP 134/77   Pulse 70   Ht  _0  (1.753 m)   Wt 145 lb 6.4 oz (66 kg)   LMP 09/15/1987   BMI 21.47 kg/m  She is a pleasant white female in no acute distress. HEENT: Normal Neck: No JVD, adenopathy, thyromegaly, or bruits. Lungs: Clear Cardiovascular: Regular rate and rhythm. Normal S1 and S2. Grade 1/6 systolic flow murmur heard best at right upper sternal border radiating to the carotids and apex. Abdomen: Soft and nontender. No masses or bruits. Bowel sounds positive. Extremities: No cyanosis or edema. Pulses are 2+ and symmetric. Skin: Warm and dry Neuro: Alert and oriented x3. Cranial nerves II through XII are intact. Is appropriate.  LABORATORY DATA: Lab Results  Component Value Date   WBC 4.1 11/06/2016   HGB 13.4 11/06/2016   HCT 39.9 11/06/2016   PLT 154.0 11/06/2016   GLUCOSE 89 11/06/2016   CHOL 189 11/09/2016   TRIG 100 11/09/2016   HDL 76 11/09/2016   LDLCALC 93 11/09/2016   ALT 24 11/09/2016   AST 32 11/09/2016   NA 140 11/06/2016   K 4.0 11/06/2016   CL 102 11/06/2016   CREATININE 0.83 11/06/2016   BUN 23 11/06/2016   CO2 30 11/06/2016   TSH 1.11 11/06/2016   INR 1.50 (H) 02/26/2015   HGBA1C 5.5 02/22/2015   Labs reviewed dated 11/07/15: cholesterol 245, triglycerides 90, HDL 82, LDL 145.   Echo: 04/24/15 Study Conclusions  - Left ventricle: The cavity size was normal. Systolic function was normal. The estimated ejection fraction was in the range of 60% to 65%. Wall motion was normal; there were no regional wall motion abnormalities. - Aortic valve: A bioprosthesis was present and functioning normally. Peak velocity (S): 250 cm/s. Mean gradient (S): 14 mm Hg. Valve area (VTI): 1.26 cm^2. Valve area (Vmax): 1.23 cm^2. Valve area (Vmean): 1.21 cm^2. - Mitral valve: Calcified annulus. Mildly thickened leaflets . There was mild regurgitation. - Right ventricle: The cavity size was mildly dilated. Wall thickness was normal.  Assessment / Plan: 1. Severe aortic  stenosis with bicuspid aortic valve. S/p AVR in June 2016. She is doing very well.  Will continue ASA and pravastatin. Follow up Echo in 2016 was satisfactory. I will follow up in 6 months. Continue routine SBE prophylaxis.  2. Hypercholesterolemia. Well controlled on pravastatin.

## 2016-11-16 ENCOUNTER — Ambulatory Visit (INDEPENDENT_AMBULATORY_CARE_PROVIDER_SITE_OTHER): Payer: PPO | Admitting: Cardiology

## 2016-11-16 ENCOUNTER — Encounter: Payer: Self-pay | Admitting: Cardiology

## 2016-11-16 VITALS — BP 134/77 | HR 70 | Ht 69.0 in | Wt 145.4 lb

## 2016-11-16 DIAGNOSIS — E785 Hyperlipidemia, unspecified: Secondary | ICD-10-CM

## 2016-11-16 DIAGNOSIS — Z952 Presence of prosthetic heart valve: Secondary | ICD-10-CM

## 2016-11-16 DIAGNOSIS — Q231 Congenital insufficiency of aortic valve: Secondary | ICD-10-CM

## 2016-11-16 NOTE — Patient Instructions (Signed)
Continue your current therapy  I will see you in 6 months.   

## 2016-11-23 ENCOUNTER — Encounter: Payer: Self-pay | Admitting: Cardiology

## 2016-11-26 ENCOUNTER — Encounter: Payer: Self-pay | Admitting: Physician Assistant

## 2016-11-29 NOTE — Assessment & Plan Note (Signed)
Colonoscopy up-to-date. Repeat due in 2020. Asymptomatic.

## 2016-11-29 NOTE — Assessment & Plan Note (Signed)
Prior history. No seizure in decades. Has been on Primidone for a long time. Tolerating well. Will refill medication. Discussed with patient need for repeat Neurology assessment and mediation may not be needed. She will give some thought to this so and get back to [place].

## 2016-11-29 NOTE — Assessment & Plan Note (Signed)
+   history. Asymptomatic at present. PHQ-9 with score of 0. Will monitor.

## 2016-11-29 NOTE — Assessment & Plan Note (Signed)
Continue statin and 81 mg ASA. Diet and exercise recommendations given. Patient scheduled for CPE so repeat fasting lipids will be assessed at that time.

## 2016-11-29 NOTE — Assessment & Plan Note (Signed)
Followed by Cardiology. Up-to-date on F/U and imaging.  Asymptomatic. F/u with Cardiology as scheduled.

## 2016-12-01 ENCOUNTER — Encounter: Payer: Self-pay | Admitting: Cardiology

## 2016-12-02 ENCOUNTER — Encounter: Payer: Self-pay | Admitting: Cardiology

## 2016-12-07 ENCOUNTER — Encounter: Payer: Self-pay | Admitting: Cardiology

## 2016-12-11 ENCOUNTER — Encounter: Payer: Self-pay | Admitting: Cardiology

## 2016-12-12 ENCOUNTER — Encounter: Payer: Self-pay | Admitting: Cardiology

## 2016-12-14 ENCOUNTER — Encounter: Payer: Self-pay | Admitting: Cardiology

## 2016-12-14 ENCOUNTER — Encounter: Payer: Self-pay | Admitting: Physician Assistant

## 2016-12-14 MED ORDER — AMOXICILLIN 500 MG PO CAPS: ORAL_CAPSULE | ORAL | 0 refills | Status: DC

## 2016-12-14 NOTE — Addendum Note (Signed)
Addended by: Brunetta Jeans on: 12/14/2016 03:30 PM   Modules accepted: Orders

## 2016-12-15 ENCOUNTER — Telehealth: Payer: Self-pay | Admitting: Cardiology

## 2016-12-15 NOTE — Telephone Encounter (Signed)
New message    Do office needs to prescribe medication Clendamiacin as   prescribed before or is Dr.Jordan going to cntinue refilling for pt?

## 2016-12-15 NOTE — Telephone Encounter (Signed)
Returned call to Juliann Pulse at Dr. Lavetta Nielsen PCP sent in rx for antibiotics to take prior to dental procedure.  Juliann Pulse reports they have a note that she usually takes clindamycin not amoxicillin.  Advised I am unable to locate any prior rx for clindamycin only amoxicillin (although pt reports penicillin allergy).  Advised primary nurse will return tomorrow and will verify with her.  Routed to primary nurse.    Dental work scheduled for 4/20

## 2016-12-16 ENCOUNTER — Encounter: Payer: Self-pay | Admitting: Cardiology

## 2016-12-16 ENCOUNTER — Encounter: Payer: Self-pay | Admitting: Physician Assistant

## 2016-12-16 MED ORDER — CLINDAMYCIN HCL 300 MG PO CAPS: ORAL_CAPSULE | ORAL | 3 refills | Status: DC

## 2016-12-16 NOTE — Telephone Encounter (Signed)
Spoke to patient clindamycin prescription sent to pharmacy.Patient is allergic to penicillin.

## 2016-12-17 ENCOUNTER — Encounter: Payer: Self-pay | Admitting: Cardiology

## 2016-12-19 ENCOUNTER — Emergency Department (HOSPITAL_COMMUNITY): Payer: PPO

## 2016-12-19 ENCOUNTER — Encounter: Payer: Self-pay | Admitting: Physician Assistant

## 2016-12-19 ENCOUNTER — Encounter: Payer: Self-pay | Admitting: Cardiology

## 2016-12-19 ENCOUNTER — Encounter (HOSPITAL_COMMUNITY): Payer: Self-pay | Admitting: *Deleted

## 2016-12-19 ENCOUNTER — Emergency Department (HOSPITAL_COMMUNITY)
Admission: EM | Admit: 2016-12-19 | Discharge: 2016-12-19 | Disposition: A | Discharge status: Home / Self Care | Payer: PPO | Attending: Emergency Medicine | Admitting: Emergency Medicine

## 2016-12-19 DIAGNOSIS — R5383 Other fatigue: Secondary | ICD-10-CM | POA: Insufficient documentation

## 2016-12-19 DIAGNOSIS — I2510 Atherosclerotic heart disease of native coronary artery without angina pectoris: Secondary | ICD-10-CM | POA: Insufficient documentation

## 2016-12-19 DIAGNOSIS — R410 Disorientation, unspecified: Secondary | ICD-10-CM | POA: Diagnosis not present

## 2016-12-19 DIAGNOSIS — Z79899 Other long term (current) drug therapy: Secondary | ICD-10-CM | POA: Diagnosis not present

## 2016-12-19 DIAGNOSIS — Z7982 Long term (current) use of aspirin: Secondary | ICD-10-CM | POA: Diagnosis not present

## 2016-12-19 DIAGNOSIS — R531 Weakness: Secondary | ICD-10-CM | POA: Insufficient documentation

## 2016-12-19 LAB — TSH: TSH: 0.74 u[IU]/mL (ref 0.350–4.500)

## 2016-12-19 LAB — CBC
HCT: 38.4 % (ref 36.0–46.0)
HEMOGLOBIN: 12.9 g/dL (ref 12.0–15.0)
MCH: 32 pg (ref 26.0–34.0)
MCHC: 33.6 g/dL (ref 30.0–36.0)
MCV: 95.3 fL (ref 78.0–100.0)
PLATELETS: 149 10*3/uL — AB (ref 150–400)
RBC: 4.03 MIL/uL (ref 3.87–5.11)
RDW: 12.6 % (ref 11.5–15.5)
WBC: 4.2 10*3/uL (ref 4.0–10.5)

## 2016-12-19 LAB — URINALYSIS, ROUTINE W REFLEX MICROSCOPIC
BILIRUBIN URINE: NEGATIVE
GLUCOSE, UA: NEGATIVE mg/dL
HGB URINE DIPSTICK: NEGATIVE
Ketones, ur: NEGATIVE mg/dL
Leukocytes, UA: NEGATIVE
Nitrite: NEGATIVE
Protein, ur: NEGATIVE mg/dL
SPECIFIC GRAVITY, URINE: 1.015 (ref 1.005–1.030)
pH: 7 (ref 5.0–8.0)

## 2016-12-19 LAB — BASIC METABOLIC PANEL
Anion gap: 10 (ref 5–15)
BUN: 17 mg/dL (ref 6–20)
CO2: 28 mmol/L (ref 22–32)
CREATININE: 0.73 mg/dL (ref 0.44–1.00)
Calcium: 8.9 mg/dL (ref 8.9–10.3)
Chloride: 101 mmol/L (ref 101–111)
GFR calc Af Amer: >60 mL/min (ref >60)
GLUCOSE: 122 mg/dL — AB (ref 65–99)
Potassium: 3.7 mmol/L (ref 3.5–5.1)
SODIUM: 139 mmol/L (ref 135–145)

## 2016-12-19 LAB — HEPATIC FUNCTION PANEL
ALT: 25 U/L (ref 14–54)
AST: 38 U/L (ref 15–41)
Albumin: 3.4 g/dL — ABNORMAL LOW (ref 3.5–5.0)
Alkaline Phosphatase: 114 U/L (ref 38–126)
BILIRUBIN TOTAL: 0.3 mg/dL (ref 0.3–1.2)
Total Protein: 7 g/dL (ref 6.5–8.1)

## 2016-12-19 LAB — CBG MONITORING, ED: Glucose-Capillary: 108 mg/dL — ABNORMAL HIGH (ref 65–99)

## 2016-12-19 LAB — LIPASE, BLOOD: Lipase: 13 U/L (ref 11–51)

## 2016-12-19 LAB — I-STAT CG4 LACTIC ACID, ED: Lactic Acid, Venous: 0.88 mmol/L (ref 0.5–1.9)

## 2016-12-19 MED ORDER — SODIUM CHLORIDE 0.9 % IV BOLUS (SEPSIS)
1000.0000 mL | Freq: Once | INTRAVENOUS | Status: AC
Start: 2016-12-19 — End: 2016-12-19
  Administered 2016-12-19: 1000 mL via INTRAVENOUS

## 2016-12-19 MED ORDER — ONDANSETRON HCL 4 MG PO TABS: 4.0000 mg | ORAL_TABLET | Freq: Three times a day (TID) | ORAL | 0 refills | Status: DC | PRN

## 2016-12-19 NOTE — ED Notes (Signed)
Pt ambulated to restroom, pt had no complaints of dizziness while ambulating.

## 2016-12-19 NOTE — Discharge Instructions (Signed)
If you begin having nausea, please take the nausea medicine as needed. Please try and maintain hydration. Please call and schedule an appointment with your PCP in the next few days for follow-up. If any symptoms change or worsen, please return to the nearest emergency department.

## 2016-12-19 NOTE — ED Triage Notes (Signed)
Pt reports generalized weakness and fatigue that started yesterday. No other complaints and no neuro deficits noted at triage. cbg 108 at triage and ekg done.

## 2016-12-19 NOTE — ED Provider Notes (Signed)
[facility] DEPT Provider Note   CSN: [id number] Arrival date & time: 12/19/16  1418     History   Chief Complaint Chief Complaint  Patient presents with  . Weakness    HPI Nicole Hess is a 71 y.o. female with a past medical history significant for CAD, aortic valve replacement, prior seizures, osteopenia, anxiety, and depression who presents with fatigue, generalized weakness, decreased appetite, nausea, and vomiting. Patient says that she has felt "drained" for the last 2 days. She denies any fevers, chills, sick contacts. She denies any chest pain, shortness of breath, palpitations, edema, or any other chest concerns. She does report some mild congestion but continues to deny a cough. She says that her symptoms feel similar to when she had electrolyte problems in the past. She says that she has had no changes in urination, no constipation, no diarrhea. She has had decreased oral intake both with food and fluids over the last 2 days. Patient denies any lightheadedness, headaches, vision changes, numbness, tingling, or focal weakness.      The history is provided by the patient and medical records. No language interpreter was used.  Weakness  Primary symptoms include no focal weakness, no loss of sensation, no loss of balance, no speech change, no memory loss, no movement disorder, no visual change, no dizziness. This is a new problem. The current episode started 2 days ago. The problem has not changed since onset.There was no focality noted. There has been no fever. Associated symptoms include vomiting. Pertinent negatives include no shortness of breath, no chest pain, no altered mental status, no confusion and no headaches. There were no medications administered prior to arrival. Associated medical issues do not include trauma, dementia or CVA.    Past Medical History:  Diagnosis Date  . Abnormal Pap smear   . Anemia   . Anxiety   . Aortic stenosis   . Bicuspid aortic valve    . Coronary artery disease   . Depression   . Gout   . Heart murmur   . Heart valve problem    will have follow up Echo-Dr Jordan-will have replacement in a couple of years  . Leukopenia    Seen by Dr. Lamonte Sakai, present since 2008, watchful waiting  . Osteopenia   . Seizures ([place])    as a child  . Shortness of breath dyspnea     Patient Active Problem List   Diagnosis Date Noted  . 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 History of colonic polyps 11/09/2016  . S/P AVR (aortic valve replacement) 04/02/2015  . Aortic stenosis due to bicuspid aortic valve 02/26/2015  . Severe aortic stenosis 05/02/2014  . Precordial pain 01/26/2013  . Depression 03/30/2012  . Hyperlipidemia 03/30/2012    Past Surgical History:  Procedure Laterality Date  . AORTIC VALVE REPLACEMENT N/A 02/26/2015   Procedure: AORTIC VALVE REPLACEMENT (AVR);  Surgeon: Gaye Pollack, MD;  Location: [facility];  Service: Open Heart Surgery;  Laterality: N/A;  . CARDIAC CATHETERIZATION N/A 01/29/2015   Procedure: Right/Left Heart Cath and Coronary Angiography;  Surgeon: Peter M Martinique, MD;  Location: [facility];  Service: Cardiovascular;  Laterality: N/A;  . CERVICAL CONE BIOPSY    . COLONOSCOPY  2010  . DOPPLER ECHOCARDIOGRAPHY  3/14  . GUM SURGERY    . OOPHORECTOMY    . PARTIAL HYSTERECTOMY    . TEE WITHOUT CARDIOVERSION N/A 02/26/2015   Procedure: TRANSESOPHAGEAL ECHOCARDIOGRAM (  TEE);  Surgeon: Gaye Pollack, MD;  Location: [facility];  Service: Open Heart Surgery;  Laterality: N/A;  . TUBAL LIGATION    . VAGINAL HYSTERECTOMY     for dysplasia    OB History    Gravida Para Term Preterm AB Living   0 0 0 0 0 0   SAB TAB Ectopic Multiple Live Births   0 0 0 0         Home Medications    Prior to Admission medications   Medication Sig Start Date End Date Taking? Authorizing Provider  Ascorbic Acid (VITAMIN C) 1000 MG tablet Take 1,000 mg by mouth  daily.    Historical Provider, MD  aspirin 81 MG tablet Take 81 mg by mouth daily.    Historical Provider, MD  Calcium Carbonate-Vitamin D (CALTRATE 600+D PO) Take 1 tablet by mouth 2 (two) times daily.    Historical Provider, MD  clindamycin (CLEOCIN) 300 MG capsule Take 600 mg ( 2 caps ) One hour before dental procedure 12/16/16   Peter M Martinique, MD  Multiple Vitamins-Minerals (MULTIVITAMIN WOMEN) TABS Take 1 tablet by mouth daily.    Historical Provider, MD  Omega-3 Fatty Acids (OMEGA-3 FISH OIL PO) Take 1 capsule by mouth 2 (two) times daily.     Historical Provider, MD  pravastatin (PRAVACHOL) 80 MG tablet Take 1 tablet (80 mg total) by mouth daily. 11/02/16   Brunetta Jeans, PA-C  primidone (MYSOLINE) 250 MG tablet Take 1 tablet (250 mg total) by mouth 2 (two) times daily. 11/02/16   Brunetta Jeans, PA-C    Family History Family History  Problem Relation Age of Onset  . Multiple myeloma Mother   . Stroke Father   . Hypertension Father   . Heart disease Father 33  . Kidney disease Father   . Cancer Father   . Cancer Maternal Grandmother     renal  . Osteoarthritis Brother     Social History Social History  Substance Use Topics  . Smoking status: Never Smoker  . Smokeless tobacco: Never Used  . Alcohol use 4.2 oz/week    7 Glasses of wine per week     Comment: 1 glass of wine/cranberry juice a day     Allergies   Other and Penicillins   Review of Systems Review of Systems  Constitutional: Positive for appetite change and fatigue. Negative for chills, diaphoresis and fever.  HENT: Positive for congestion and rhinorrhea.   Eyes: Negative for visual disturbance.  Respiratory: Negative for cough, chest tightness, shortness of breath, wheezing and stridor.   Cardiovascular: Negative for chest pain, palpitations and leg swelling.  Gastrointestinal: Positive for nausea and vomiting. Negative for abdominal pain, constipation and diarrhea.  Genitourinary: Negative for  difficulty urinating, dysuria, flank pain and frequency.  Musculoskeletal: Negative for back pain, neck pain and neck stiffness.  Skin: Negative for rash and wound.  Neurological: Positive for weakness. Negative for dizziness, speech change, focal weakness, light-headedness, headaches and loss of balance.  Psychiatric/Behavioral: Negative for confusion and memory loss.  All other systems reviewed and are negative.    Physical Exam Updated Vital Signs BP 119/86 (BP Location: Right Arm)   Pulse 64   Temp 98.2 F (36.8 C) (Oral)   Resp 16   LMP 09/15/1987   SpO2 100%   Physical Exam  Constitutional: She is oriented to person, place, and time. She appears well-developed and well-nourished. No distress.  HENT:  Head: Normocephalic and atraumatic.  Right  Ear: External ear normal.  Left Ear: External ear normal.  Nose: Nose normal.  Mouth/Throat: Oropharynx is clear and moist. No oropharyngeal exudate.  Eyes: Conjunctivae and EOM are normal. Pupils are equal, round, and reactive to light.  Neck: Normal range of motion. Neck supple.  Cardiovascular: Normal rate, regular rhythm and intact distal pulses.   Murmur heard. Pulmonary/Chest: Effort normal. No stridor. No respiratory distress. She has no wheezes. She exhibits no tenderness.  Abdominal: Soft. She exhibits no distension. There is no tenderness. There is no rebound.  Musculoskeletal: She exhibits no edema or tenderness.  Neurological: She is alert and oriented to person, place, and time. She has normal reflexes. She is not disoriented. She displays no tremor. No cranial nerve deficit or sensory deficit. She exhibits normal muscle tone. Coordination and gait normal. GCS eye subscore is 4. GCS verbal subscore is 5. GCS motor subscore is 6.  Skin: Skin is warm. Capillary refill takes less than 2 seconds. No rash noted. She is not diaphoretic. No erythema.  Psychiatric: She has a normal mood and affect.  Nursing note and vitals  reviewed.    ED Treatments / Results  Labs (all labs ordered are listed, but only abnormal results are displayed) Labs Reviewed  BASIC METABOLIC PANEL - Abnormal; Notable for the following:       Result Value   Glucose, Bld 122 (*)    All other components within normal limits  CBC - Abnormal; Notable for the following:    Platelets 149 (*)    All other components within normal limits  URINALYSIS, ROUTINE W REFLEX MICROSCOPIC - Abnormal; Notable for the following:    APPearance HAZY (*)    All other components within normal limits  HEPATIC FUNCTION PANEL - Abnormal; Notable for the following:    Albumin 3.4 (*)    Bilirubin, Direct <0.1 (*)    All other components within normal limits  CBG MONITORING, ED - Abnormal; Notable for the following:    Glucose-Capillary 108 (*)    All other components within normal limits  LIPASE, BLOOD  TSH  I-STAT CG4 LACTIC ACID, ED  I-STAT CG4 LACTIC ACID, ED    EKG  EKG Interpretation  Date/Time:  Saturday December 19 2016 14:28:27 EDT Ventricular Rate:  69 PR Interval:  142 QRS Duration: 76 QT Interval:  404 QTC Calculation: 432 R Axis:   7 Text Interpretation:  Normal sinus rhythm Nonspecific T wave abnormality Abnormal ECG when compared to prior, no significant changes seen.  No STEMI Confirmed by Sherry Ruffing MD, [place] [id number]) on 12/19/2016 3:07:05 PM       Radiology Dg Chest 2 View  Result Date: 12/19/2016 CLINICAL DATA:  Fatigue, confusion EXAM: CHEST  2 VIEW COMPARISON:  02/01/2016 FINDINGS: Lungs are clear.  No pleural effusion or pneumothorax. Heart is normal in size.  Prosthetic aortic valve. Median sternotomy. IMPRESSION: No evidence of acute cardiopulmonary disease. Electronically Signed   By: Julian Hy M.D.   On: 12/19/2016 16:10    Procedures Procedures (including critical care time)  Medications Ordered in ED Medications  sodium chloride 0.9 % bolus 1,000 mL (0 mLs Intravenous Stopped 12/19/16 1827)      Initial Impression / Assessment and Plan / ED Course  I have reviewed the triage vital signs and the nursing notes.  Pertinent labs & imaging results that were available during my care of the patient were reviewed by me and considered in my medical decision making (see chart for details).  JEMIMAH CRESSY is a 71 y.o. female with a past medical history significant for CAD, aortic valve replacement, prior seizures, osteopenia, anxiety, and depression who presents with fatigue, generalized weakness, decreased appetite, nausea, and vomiting.  History and exam are seen above. Neck exam, patient had no focal neurologic deficits. Patient had normal gait on assessment. Patient had small abrasion on her left knee which she says was when she tripped several weeks ago. No surrounding erythema, tenderness, crepitance, or evidence of infection. Patient had normal strength, sensation in her lower extremities. Normal pulses in all extremity. Patient has systolic murmur. Lungs are clear. Abdomen and chest are nontender. Mild congestion appreciated.  Given patient's generalized fatigue, patient will have workup to look for dehydration with her nausea, vomiting, and decreased oral intake. She will have workup for occult infection given the congestion. She will be given fluids to help rehydrate and reassessed.  Diagnostic testing was reassuring. No evidence of pneumonia or UTI. Laboratory testing reassuring.  Patient reported feeling much better after fluids and nausea medication. Suspect dehydration contributing to fatigue.  Given improvement in symptoms and reassuring workup, feel patient is stable for discharge. Patient given prescription for Zofran and encouraged to stay hydrated. Patient will follow up with PCP in several days for further management and reevaluation. Strict return precautions were given and understood. Patient had no other questions or concerns and patient was discharged in good  condition.   Final Clinical Impressions(s) / ED Diagnoses   Final diagnoses:  Generalized weakness  Fatigue, unspecified type    New Prescriptions Discharge Medication List as of 12/19/2016  8:07 PM    START taking these medications   Details  ondansetron (ZOFRAN) 4 MG tablet Take 1 tablet (4 mg total) by mouth every 8 (eight) hours as needed for nausea or vomiting., Starting Sat 12/19/2016, Print        Clinical Impression: 1. Generalized weakness   2. Fatigue, unspecified type     Disposition: Discharge  Condition: Good  I have discussed the results, Dx and Tx plan with the pt(& family if present). He/she/they expressed understanding and agree(s) with the plan. Discharge instructions discussed at great length. Strict return precautions discussed and pt &/or family have verbalized understanding of the instructions. No further questions at time of discharge.    Discharge Medication List as of 12/19/2016  8:07 PM    START taking these medications   Details  ondansetron (ZOFRAN) 4 MG tablet Take 1 tablet (4 mg total) by mouth every 8 (eight) hours as needed for nausea or vomiting., Starting Sat 12/19/2016, Print        Follow Up: Brunetta Jeans, PA-C [address] [place] [address] [phone]  Schedule an appointment as soon as possible for a visit    [facility] [address] [id number] [place] [phone]  If symptoms worsen     Courtney Paris, MD 12/21/16 [phone]

## 2016-12-20 ENCOUNTER — Encounter: Payer: Self-pay | Admitting: Physician Assistant

## 2016-12-20 ENCOUNTER — Encounter: Payer: Self-pay | Admitting: Cardiology

## 2016-12-21 ENCOUNTER — Telehealth: Payer: Self-pay | Admitting: Physician Assistant

## 2016-12-21 ENCOUNTER — Encounter: Payer: Self-pay | Admitting: Physician Assistant

## 2016-12-21 NOTE — Telephone Encounter (Signed)
Sent a my chart message to patient to schedule an appointment.

## 2016-12-21 NOTE — Telephone Encounter (Signed)
Voice mail message received from patient on phone at front desk on Saturday, 12/19/16 at 9:24pm.  Patient requesting call back from [facility] when office reopens on Monday regarding her visit to [facility] on 12/19/16.

## 2016-12-21 NOTE — Telephone Encounter (Signed)
Tried to reach pt to schedule an appt, could not leave message, VM not set up.

## 2016-12-22 ENCOUNTER — Encounter: Payer: Self-pay | Admitting: Cardiology

## 2016-12-22 ENCOUNTER — Encounter: Payer: Self-pay | Admitting: Physician Assistant

## 2016-12-24 ENCOUNTER — Encounter: Payer: Self-pay | Admitting: Physician Assistant

## 2016-12-25 ENCOUNTER — Encounter: Payer: Self-pay | Admitting: Physician Assistant

## 2016-12-25 ENCOUNTER — Ambulatory Visit (INDEPENDENT_AMBULATORY_CARE_PROVIDER_SITE_OTHER): Payer: PPO | Admitting: Physician Assistant

## 2016-12-25 ENCOUNTER — Encounter: Payer: Self-pay | Admitting: Obstetrics & Gynecology

## 2016-12-25 VITALS — BP 110/62 | HR 68 | Temp 98.6°F | Resp 14 | Ht 69.0 in | Wt 142.0 lb

## 2016-12-25 DIAGNOSIS — R5383 Other fatigue: Secondary | ICD-10-CM

## 2016-12-25 NOTE — Progress Notes (Signed)
Pre visit review using our clinic review tool, if applicable. No additional management support is needed unless otherwise documented below in the visit note. 

## 2016-12-25 NOTE — Progress Notes (Signed)
Patient presents to clinic today for ER follow-up. Patient presented to ER on 12/19/16 with complaints of fatigue noted over the prior 24 hours. Was concerned giving her history of heart murmur. Denies having any other symptoms at the time. States she was up late the night before and slept until 1 pm the next day which is "not like her". ER workup included numerous unremarkable labs, EKG with NSR and negative CXR. Patient was feeling fine and was discharged home. Patient states since discharge she has been doing great. Is staying active. Endorses well-balanced diet. Patient denies chest pain, palpitations, lightheadedness, dizziness, vision changes or frequent headaches. Is resting well without oversleeping.  Past Medical History:  Diagnosis Date  . Abnormal Pap smear   . Anemia   . Anxiety   . Aortic stenosis   . Bicuspid aortic valve   . Coronary artery disease   . Depression   . Gout   . Heart murmur   . Heart valve problem    will have follow up Echo-Dr Jordan-will have replacement in a couple of years  . Leukopenia    Seen by Dr. Lamonte Sakai, present since 2008, watchful waiting  . Osteopenia   . Seizures ([place])    as a child  . Shortness of breath dyspnea     Current Outpatient Prescriptions on File Prior to Visit  Medication Sig Dispense Refill  . Ascorbic Acid (VITAMIN C) 1000 MG tablet Take 1,000 mg by mouth daily.    Marland Kitchen aspirin 81 MG tablet Take 81 mg by mouth daily.    . Calcium Carbonate-Vitamin D (CALTRATE 600+D PO) Take 1 tablet by mouth 2 (two) times daily.    . clindamycin (CLEOCIN) 300 MG capsule Take 600 mg ( 2 caps ) One hour before dental procedure 6 capsule 3  . Multiple Vitamins-Minerals (MULTIVITAMIN WOMEN) TABS Take 1 tablet by mouth daily.    . Omega-3 Fatty Acids (OMEGA-3 FISH OIL PO) Take 1 capsule by mouth 2 (two) times daily.     . ondansetron (ZOFRAN) 4 MG tablet Take 1 tablet (4 mg total) by mouth every 8 (eight) hours as needed for nausea or vomiting. 12 tablet 0   . pravastatin (PRAVACHOL) 80 MG tablet Take 1 tablet (80 mg total) by mouth daily. 30 tablet 3  . primidone (MYSOLINE) 250 MG tablet Take 1 tablet (250 mg total) by mouth 2 (two) times daily. 60 tablet 3  . psyllium (REGULOID) 0.52 g capsule Take 0.52 g by mouth daily.     No current facility-administered medications on file prior to visit.     Allergies  Allergen Reactions  . Dextromethorphan     contraindication  . Pseudoephedrine     contriindication  . Penicillins Rash    Has patient had a PCN reaction causing immediate rash, facial/tongue/throat swelling, SOB or lightheadedness with hypotension: No Has patient had a PCN reaction causing severe rash involving mucus membranes or skin necrosis: No Has patient had a PCN reaction that required hospitalization No Has patient had a PCN reaction occurring within the last 10 years: No If all of the above answers are "NO", then may proceed with Cephalosporin use.    Family History  Problem Relation Age of Onset  . Multiple myeloma Mother   . Stroke Father   . Hypertension Father   . Heart disease Father 66  . Kidney disease Father   . Cancer Father   . Cancer Maternal Grandmother     renal  . Osteoarthritis  Brother     Social History   Social History  . Marital status: Single    Spouse name: N/A  . Number of children: N/A  . Years of education: N/A   Occupational History  . paralegal    Social History Main Topics  . Smoking status: Never Smoker  . Smokeless tobacco: Never Used  . Alcohol use 4.2 oz/week    7 Glasses of wine per week     Comment: 1 glass of wine/cranberry juice a day  . Drug use: No  . Sexual activity: No     Comment: TVH   Other Topics Concern  . None   Social History Narrative  . None   Review of Systems - See HPI.  All other ROS are negative.  BP 110/62   Pulse 68   Temp 98.6 F (37 C) (Oral)   Resp 14   Ht _0  (1.753 m)   Wt 142 lb (64.4 kg)   LMP 09/15/1987   SpO2 99%   BMI  20.97 kg/m   Physical Exam  Constitutional: She is oriented to person, place, and time and well-developed, well-nourished, and in no distress.  HENT:  Head: Normocephalic and atraumatic.  Right Ear: External ear normal.  Left Ear: External ear normal.  Nose: Nose normal.  Mouth/Throat: Oropharynx is clear and moist. No oropharyngeal exudate.  TM within normal limits bilaterally.  Eyes: Conjunctivae are normal.  Neck: Neck supple.  Cardiovascular: Normal rate, regular rhythm, normal heart sounds and intact distal pulses.   Pulmonary/Chest: Effort normal and breath sounds normal. No respiratory distress. She has no wheezes. She has no rales. She exhibits no tenderness.  Neurological: She is alert and oriented to person, place, and time.  Skin: Skin is warm and dry. No rash noted.  Psychiatric: Affect normal.  Vitals reviewed.  Recent Results (from the past 2160 hour(s))  Basic metabolic panel     Status: None   Collection Time: 10/22/16 12:00 AM  Result Value Ref Range   BUN 21 4 - 21 mg/dL   Creatinine 0.8 0.5 - 1.1 mg/dL   Potassium 4.6 3.4 - 5.3 mmol/L   Sodium 141 137 - 147 mmol/L  Urine cytology ancillary only     Status: None   Collection Time: 11/06/16 12:00 AM  Result Value Ref Range   Chlamydia Negative     Comment: Normal Reference Range - Negative   Neisseria gonorrhea Negative     Comment: Normal Reference Range - Negative  HIV antibody     Status: None   Collection Time: 11/06/16 10:22 AM  Result Value Ref Range   HIV 1&2 Ab, 4th Generation NONREACTIVE NONREACTIVE    Comment:   HIV-1 antigen and HIV-1/HIV-2 antibodies were not detected.  There is no laboratory evidence of HIV infection.   HIV-1/2 Antibody Diff        Not indicated. HIV-1 RNA, Qual TMA          Not indicated.     PLEASE NOTE: This information has been disclosed to you from records whose confidentiality may be protected by state law. If your state requires such protection, then the state law  prohibits you from making any further disclosure of the information without the specific written consent of the person to whom it pertains, or as otherwise permitted by law. A general authorization for the release of medical or other information is NOT sufficient for this purpose.   The performance of this assay has not been  clinically validated in patients less than 73 years old.   For additional information please refer to http://education.[employer].com/faq/FAQ106.  (This link is being provided for informational/educational purposes only.)     RPR     Status: None   Collection Time: 11/06/16 10:22 AM  Result Value Ref Range   RPR Ser Ql NON REAC NON REAC  CBC     Status: None   Collection Time: 11/06/16 10:22 AM  Result Value Ref Range   WBC 4.1 4.0 - 10.5 K/uL   RBC 4.12 3.87 - 5.11 Mil/uL   Platelets 154.0 150.0 - 400.0 K/uL   Hemoglobin 13.4 12.0 - 15.0 g/dL   HCT 39.9 36.0 - 46.0 %   MCV 96.9 78.0 - 100.0 fl   MCHC 33.5 30.0 - 36.0 g/dL   RDW 13.0 11.5 - 15.5 %  Comprehensive metabolic panel     Status: None   Collection Time: 11/06/16 10:22 AM  Result Value Ref Range   Sodium 140 135 - 145 mEq/L   Potassium 4.0 3.5 - 5.1 mEq/L   Chloride 102 96 - 112 mEq/L   CO2 30 19 - 32 mEq/L   Glucose, Bld 89 70 - 99 mg/dL   BUN 23 6 - 23 mg/dL   Creatinine, Ser 0.83 0.40 - 1.20 mg/dL   Total Bilirubin 0.3 0.2 - 1.2 mg/dL   Alkaline Phosphatase 113 39 - 117 U/L   AST 37 0 - 37 U/L   ALT 27 0 - 35 U/L   Total Protein 7.7 6.0 - 8.3 g/dL   Albumin 4.5 3.5 - 5.2 g/dL   Calcium 9.4 8.4 - 10.5 mg/dL   GFR 72.47 >60.00 mL/min  Lipid panel     Status: Abnormal   Collection Time: 11/06/16 10:22 AM  Result Value Ref Range   Cholesterol 194 0 - 200 mg/dL    Comment: ATP III Classification       Desirable:  < 200 mg/dL               Borderline High:  200 - 239 mg/dL          High:  > = 240 mg/dL   Triglycerides 59.0 0.0 - 149.0 mg/dL    Comment: Normal:  <150 mg/dLBorderline  High:  150 - 199 mg/dL   HDL 80.10 >39.00 mg/dL   VLDL 11.8 0.0 - 40.0 mg/dL   LDL Cholesterol 102 (H) 0 - 99 mg/dL   Total CHOL/HDL Ratio 2     Comment:                Men          Women1/2 Average Risk     3.4          3.3Average Risk          5.0          4.42X Average Risk          9.6          7.13X Average Risk          15.0          11.0                       NonHDL 114.18     Comment: NOTE:  Non-HDL goal should be 30 mg/dL higher than patient's LDL goal (i.e. LDL goal of < 70 mg/dL, would have non-HDL goal of < 100 mg/dL)  TSH  Status: None   Collection Time: 11/06/16 10:22 AM  Result Value Ref Range   TSH 1.11 0.35 - 4.50 uIU/mL  Urinalysis, Routine w reflex microscopic     Status: None   Collection Time: 11/06/16 10:22 AM  Result Value Ref Range   Color, Urine YELLOW Yellow;Lt. Yellow   APPearance CLEAR Clear   Specific Gravity, Urine 1.010 1.000 - 1.030   pH 6.0 5.0 - 8.0   Total Protein, Urine NEGATIVE Negative   Urine Glucose NEGATIVE Negative   Ketones, ur NEGATIVE Negative   Bilirubin Urine NEGATIVE Negative   Hgb urine dipstick NEGATIVE Negative   Urobilinogen, UA 0.2 0.0 - 1.0   Leukocytes, UA NEGATIVE Negative   Nitrite NEGATIVE Negative   WBC, UA 0-2/hpf 0-2/hpf   RBC / HPF none seen 0-2/hpf  Hepatic function panel     Status: None   Collection Time: 11/09/16  8:04 AM  Result Value Ref Range   Total Bilirubin 0.3 0.2 - 1.2 mg/dL   Bilirubin, Direct 0.1 <=0.2 mg/dL   Indirect Bilirubin 0.2 0.2 - 1.2 mg/dL   Alkaline Phosphatase 110 33 - 130 U/L   AST 32 10 - 35 U/L   ALT 24 6 - 29 U/L   Total Protein 7.2 6.1 - 8.1 g/dL   Albumin 4.2 3.6 - 5.1 g/dL  Lipid panel     Status: None   Collection Time: 11/09/16  8:04 AM  Result Value Ref Range   Cholesterol 189 <200 mg/dL   Triglycerides 100 <150 mg/dL   HDL 76 >50 mg/dL   Total CHOL/HDL Ratio 2.5 <5.0 Ratio   VLDL 20 <30 mg/dL   LDL Cholesterol 93 <100 mg/dL  CBG monitoring, ED     Status: Abnormal    Collection Time: 12/19/16  2:32 PM  Result Value Ref Range   Glucose-Capillary 108 (H) 65 - 99 mg/dL  Basic metabolic panel     Status: Abnormal   Collection Time: 12/19/16  3:00 PM  Result Value Ref Range   Sodium 139 135 - 145 mmol/L   Potassium 3.7 3.5 - 5.1 mmol/L   Chloride 101 101 - 111 mmol/L   CO2 28 22 - 32 mmol/L   Glucose, Bld 122 (H) 65 - 99 mg/dL   BUN 17 6 - 20 mg/dL   Creatinine, Ser 0.73 0.44 - 1.00 mg/dL   Calcium 8.9 8.9 - 10.3 mg/dL   GFR calc non Af Amer >60 >60 mL/min   GFR calc Af Amer >60 >60 mL/min    Comment: (NOTE) The eGFR has been calculated using the CKD EPI equation. This calculation has not been validated in all clinical situations. eGFR's persistently <60 mL/min signify possible Chronic Kidney Disease.    Anion gap 10 5 - 15  CBC     Status: Abnormal   Collection Time: 12/19/16  3:00 PM  Result Value Ref Range   WBC 4.2 4.0 - 10.5 K/uL   RBC 4.03 3.87 - 5.11 MIL/uL   Hemoglobin 12.9 12.0 - 15.0 g/dL   HCT 38.4 36.0 - 46.0 %   MCV 95.3 78.0 - 100.0 fL   MCH 32.0 26.0 - 34.0 pg   MCHC 33.6 30.0 - 36.0 g/dL   RDW 12.6 11.5 - 15.5 %   Platelets 149 (L) 150 - 400 K/uL  Urinalysis, Routine w reflex microscopic     Status: Abnormal   Collection Time: 12/19/16  3:26 PM  Result Value Ref Range   Color, Urine YELLOW YELLOW  APPearance HAZY (A) CLEAR   Specific Gravity, Urine 1.015 1.005 - 1.030   pH 7.0 5.0 - 8.0   Glucose, UA NEGATIVE NEGATIVE mg/dL   Hgb urine dipstick NEGATIVE NEGATIVE   Bilirubin Urine NEGATIVE NEGATIVE   Ketones, ur NEGATIVE NEGATIVE mg/dL   Protein, ur NEGATIVE NEGATIVE mg/dL   Nitrite NEGATIVE NEGATIVE   Leukocytes, UA NEGATIVE NEGATIVE  Hepatic function panel     Status: Abnormal   Collection Time: 12/19/16  3:30 PM  Result Value Ref Range   Total Protein 7.0 6.5 - 8.1 g/dL   Albumin 3.4 (L) 3.5 - 5.0 g/dL   AST 38 15 - 41 U/L   ALT 25 14 - 54 U/L   Alkaline Phosphatase 114 38 - 126 U/L   Total Bilirubin 0.3 0.3  - 1.2 mg/dL   Bilirubin, Direct <0.1 (L) 0.1 - 0.5 mg/dL   Indirect Bilirubin NOT CALCULATED 0.3 - 0.9 mg/dL  Lipase, blood     Status: None   Collection Time: 12/19/16  3:30 PM  Result Value Ref Range   Lipase 13 11 - 51 U/L  TSH     Status: None   Collection Time: 12/19/16  3:30 PM  Result Value Ref Range   TSH 0.740 0.350 - 4.500 uIU/mL    Comment: Performed by a 3rd Generation assay with a functional sensitivity of <=0.01 uIU/mL.  I-Stat CG4 Lactic Acid, ED     Status: None   Collection Time: 12/19/16  7:27 PM  Result Value Ref Range   Lactic Acid, Venous 0.88 0.5 - 1.9 mmol/L    Assessment/Plan: 1. Fatigue, unspecified type One episode. No alarm signs/symptoms. ER assessment negative. No residual symptoms. Exam today unremarkable.  No indication for further assessment. Discussed regular bed time with patient.   Leeanne Rio, PA-C

## 2016-12-25 NOTE — Patient Instructions (Signed)
I am glad you are doing well. Exam today looks great. Consistency with bed time is good for restful sleep.   Have a great weekend!

## 2016-12-27 ENCOUNTER — Other Ambulatory Visit: Payer: Self-pay | Admitting: Obstetrics & Gynecology

## 2016-12-28 ENCOUNTER — Encounter: Payer: Self-pay | Admitting: Physician Assistant

## 2016-12-28 ENCOUNTER — Encounter: Payer: Self-pay | Admitting: Cardiology

## 2016-12-28 ENCOUNTER — Telehealth: Payer: Self-pay | Admitting: *Deleted

## 2016-12-28 NOTE — Telephone Encounter (Signed)
A friend of patient called and states that he is very concerned over patient.   They have been friends for a while now and her behavior has changed so much that her friends are concerned.   This friend (Nicole Hess) stated that Nicole Hess is supposed to be contacting our office to let us know that we can discuss her care with him.    I told him that since he is not currently on her DPR, we cannot discuss the care with him until she lets [place] know that we can. He understands that - but still stated that patient is having major issues with memory - He stated that she is very forgetful and is constantly calling people asking the same questions (sometimes every 5-10 minutes) -   Friends are discussing it among themselves and are really worried about her.

## 2016-12-28 NOTE — Telephone Encounter (Signed)
FYI: Patient has appointment on 12/31/16 to discuss memory issues

## 2016-12-29 ENCOUNTER — Encounter: Payer: Self-pay | Admitting: Physician Assistant

## 2016-12-29 ENCOUNTER — Encounter: Payer: Self-pay | Admitting: Cardiology

## 2016-12-29 DIAGNOSIS — D2272 Melanocytic nevi of left lower limb, including hip: Secondary | ICD-10-CM | POA: Diagnosis not present

## 2016-12-29 DIAGNOSIS — D1801 Hemangioma of skin and subcutaneous tissue: Secondary | ICD-10-CM | POA: Diagnosis not present

## 2016-12-29 DIAGNOSIS — L814 Other melanin hyperpigmentation: Secondary | ICD-10-CM | POA: Diagnosis not present

## 2016-12-29 DIAGNOSIS — D225 Melanocytic nevi of trunk: Secondary | ICD-10-CM | POA: Diagnosis not present

## 2016-12-29 DIAGNOSIS — L821 Other seborrheic keratosis: Secondary | ICD-10-CM | POA: Diagnosis not present

## 2016-12-29 NOTE — Telephone Encounter (Signed)
Noted. Will see at her visit.

## 2016-12-31 ENCOUNTER — Ambulatory Visit: Payer: PPO | Admitting: Physician Assistant

## 2016-12-31 NOTE — Progress Notes (Signed)
71 y.o. G0P0000 SingleCaucasianF here for annual exam.  Doing well.  Had ER visit recently.  She wondered if she was having issues with her heart valve.  Evaluation was negative.  Saw Dr. Martinique in March.    Denies vaginal bleeding.   Patient's last menstrual period was 09/15/1987.          Sexually active: No.  The current method of family planning is status post hysterectomy.    Exercising: Yes.    walking Smoker:  no  Health Maintenance: Pap:  09/17/15 Neg  04/20/13 Neg  History of abnormal Pap:  Yes, remote hx of dysplasia MMG:  06/03/16 BIRADS2:benign  Colonoscopy:  10/23/13 f/u 5 years BMD:   10/14/16 Osteopenia  TDaP: 2016 Pneumonia vaccine(s):  09/2015 Zostavax:   08/2016 Hep C testing: 05/13/16 Neg  Screening Labs: PCP   reports that she has never smoked. She has never used smokeless tobacco. She reports that she drinks about 4.2 oz of alcohol per week . She reports that she does not use drugs.  Past Medical History:  Diagnosis Date  . Abnormal Pap smear   . Anemia   . Anxiety   . Aortic stenosis   . Bicuspid aortic valve   . Coronary artery disease   . Depression   . Gout   . Heart murmur   . Heart valve problem    will have follow up Echo-Dr Jordan-will have replacement in a couple of years  . Leukopenia    Seen by Dr. Lamonte Sakai, present since 2008, watchful waiting  . Osteopenia   . Seizures ([place])    as a child  . Shortness of breath dyspnea     Past Surgical History:  Procedure Laterality Date  . AORTIC VALVE REPLACEMENT N/A 02/26/2015   Procedure: AORTIC VALVE REPLACEMENT (AVR);  Surgeon: Gaye Pollack, MD;  Location: [facility];  Service: Open Heart Surgery;  Laterality: N/A;  . CARDIAC CATHETERIZATION N/A 01/29/2015   Procedure: Right/Left Heart Cath and Coronary Angiography;  Surgeon: Peter M Martinique, MD;  Location: [place] [facility];  Service: Cardiovascular;  Laterality: N/A;  . CERVICAL CONE BIOPSY    . COLONOSCOPY  2010  . DOPPLER ECHOCARDIOGRAPHY  3/14  . GUM  SURGERY    . OOPHORECTOMY    . PARTIAL HYSTERECTOMY    . TEE WITHOUT CARDIOVERSION N/A 02/26/2015   Procedure: TRANSESOPHAGEAL ECHOCARDIOGRAM (TEE);  Surgeon: Gaye Pollack, MD;  Location: [facility];  Service: Open Heart Surgery;  Laterality: N/A;  . TUBAL LIGATION    . VAGINAL HYSTERECTOMY     for dysplasia    Current Outpatient Prescriptions  Medication Sig Dispense Refill  . Ascorbic Acid (VITAMIN C) 1000 MG tablet Take 1,000 mg by mouth daily.    Marland Kitchen aspirin 81 MG tablet Take 81 mg by mouth daily.    . Calcium Carbonate-Vitamin D (CALTRATE 600+D PO) Take 1 tablet by mouth 2 (two) times daily.    . clindamycin (CLEOCIN) 300 MG capsule Take 600 mg ( 2 caps ) One hour before dental procedure 6 capsule 3  . Multiple Vitamins-Minerals (MULTIVITAMIN WOMEN) TABS Take 1 tablet by mouth daily.    . Omega-3 Fatty Acids (OMEGA-3 FISH OIL PO) Take 1 capsule by mouth 2 (two) times daily.     . pravastatin (PRAVACHOL) 80 MG tablet Take 1 tablet (80 mg total) by mouth daily. 30 tablet 3  . primidone (MYSOLINE) 250 MG tablet Take 1 tablet (250 mg total) by mouth 2 (two)  times daily. 60 tablet 3   No current facility-administered medications for this visit.     Family History  Problem Relation Age of Onset  . Multiple myeloma Mother   . Stroke Father   . Hypertension Father   . Heart disease Father 27  . Kidney disease Father   . Cancer Father   . Cancer Maternal Grandmother     renal  . Osteoarthritis Brother     ROS:  Pertinent items are noted in HPI.  Otherwise, a comprehensive ROS was negative.  Exam:   BP 104/66 (BP Location: Right Arm, Patient Position: Sitting, Cuff Size: Normal)   Pulse 70   Resp 16   Ht 5' 7.5" (1.715 m)   Wt 143 lb (64.9 kg)   LMP 09/15/1987   BMI 22.07 kg/m   Weight change: +6# Height: 5' 7.5" (171.5 cm)  Ht Readings from Last 3 Encounters:  01/01/17 5' 7.5" (1.715 m)  12/25/16 5' 9"  (1.753 m)  11/16/16 5' 9"  (1.753 m)    General appearance: alert,  cooperative and appears stated age Head: Normocephalic, without obvious abnormality, atraumatic Neck: no adenopathy, supple, symmetrical, trachea midline and thyroid normal to inspection and palpation Lungs: clear to auscultation bilaterally Breasts: normal appearance, no masses or tenderness Heart: regular rate and rhythm Abdomen: soft, non-tender; bowel sounds normal; no masses,  no organomegaly Extremities: extremities normal, atraumatic, no cyanosis or edema Skin: Skin color, texture, turgor normal. No rashes or lesions Lymph nodes: Cervical, supraclavicular, and axillary nodes normal. No abnormal inguinal nodes palpated Neurologic: Grossly normal   Pelvic: External genitalia:  no lesions              Urethra:  normal appearing urethra with no masses, tenderness or lesions              Bartholins and Skenes: normal                 Vagina: normal appearing vagina with normal color and discharge, no lesions              Cervix: absent              Pap taken: No. Bimanual Exam:  Uterus:  uterus absent              Adnexa: no mass, fullness, tenderness               Rectovaginal: Confirms               Anus:  normal sphincter tone, no lesions  Chaperone was present for exam.  A:  Well Woman with normal exam PMP, no HRT Remote hx of dysplasia, s/p TVH S/P laparoscopic BSO 9/08 Osteopenia H/O gout Vaginal atrophic changes S/p aortic valve with Dr. Cyndia Bent 6/16  P:   Mammogram guidelines reviewed Pap smear not indicated Blood work and vaccines UTD Follow up 1 year or prn

## 2017-01-01 ENCOUNTER — Encounter: Payer: Self-pay | Admitting: Obstetrics & Gynecology

## 2017-01-01 ENCOUNTER — Ambulatory Visit (INDEPENDENT_AMBULATORY_CARE_PROVIDER_SITE_OTHER): Payer: PPO | Admitting: Obstetrics & Gynecology

## 2017-01-01 VITALS — BP 104/66 | HR 70 | Resp 16 | Ht 67.5 in | Wt 143.0 lb

## 2017-01-01 DIAGNOSIS — Z01419 Encounter for gynecological examination (general) (routine) without abnormal findings: Secondary | ICD-10-CM | POA: Diagnosis not present

## 2017-01-04 ENCOUNTER — Encounter: Payer: Self-pay | Admitting: Cardiology

## 2017-01-04 ENCOUNTER — Encounter: Payer: Self-pay | Admitting: Physician Assistant

## 2017-01-04 ENCOUNTER — Telehealth: Payer: Self-pay | Admitting: Cardiology

## 2017-01-04 NOTE — Telephone Encounter (Signed)
Pt says she got up out of chair and she felt real wobbly hard for her to get herself steady.She also feels lightheaded when she is sitting.

## 2017-01-04 NOTE — Telephone Encounter (Signed)
Spoke with pt, she has been out all day doing errands. She has been sitting for about 2 hours and when she got up she became dizzy. She sat back down and now she feels fine. She denies any SOB or chest pain. Denies any allergies or sinus congestion. Encouraged patient to drink plenty of fluids and be careful with position changes. She will call back if she continues to have problems.

## 2017-01-05 ENCOUNTER — Emergency Department (HOSPITAL_COMMUNITY)
Admission: EM | Admit: 2017-01-05 | Discharge: 2017-01-05 | Disposition: A | Discharge status: Home / Self Care | Payer: PPO | Attending: Emergency Medicine | Admitting: Emergency Medicine

## 2017-01-05 ENCOUNTER — Encounter: Payer: Self-pay | Admitting: Cardiology

## 2017-01-05 ENCOUNTER — Encounter (HOSPITAL_COMMUNITY): Payer: Self-pay | Admitting: *Deleted

## 2017-01-05 ENCOUNTER — Telehealth: Payer: Self-pay | Admitting: Physician Assistant

## 2017-01-05 ENCOUNTER — Other Ambulatory Visit: Payer: Self-pay

## 2017-01-05 DIAGNOSIS — Z7982 Long term (current) use of aspirin: Secondary | ICD-10-CM | POA: Insufficient documentation

## 2017-01-05 DIAGNOSIS — R42 Dizziness and giddiness: Secondary | ICD-10-CM | POA: Diagnosis not present

## 2017-01-05 DIAGNOSIS — R9431 Abnormal electrocardiogram [ECG] [EKG]: Secondary | ICD-10-CM | POA: Diagnosis not present

## 2017-01-05 DIAGNOSIS — I2510 Atherosclerotic heart disease of native coronary artery without angina pectoris: Secondary | ICD-10-CM | POA: Insufficient documentation

## 2017-01-05 DIAGNOSIS — R112 Nausea with vomiting, unspecified: Secondary | ICD-10-CM | POA: Diagnosis not present

## 2017-01-05 LAB — COMPREHENSIVE METABOLIC PANEL
ALBUMIN: 3.5 g/dL (ref 3.5–5.0)
ALT: 22 U/L (ref 14–54)
ANION GAP: 10 (ref 5–15)
AST: 34 U/L (ref 15–41)
Alkaline Phosphatase: 110 U/L (ref 38–126)
BILIRUBIN TOTAL: 0.5 mg/dL (ref 0.3–1.2)
BUN: 15 mg/dL (ref 6–20)
CALCIUM: 8.6 mg/dL — AB (ref 8.9–10.3)
CO2: 24 mmol/L (ref 22–32)
Chloride: 99 mmol/L — ABNORMAL LOW (ref 101–111)
Creatinine, Ser: 0.51 mg/dL (ref 0.44–1.00)
GFR calc non Af Amer: >60 mL/min (ref >60)
Glucose, Bld: 112 mg/dL — ABNORMAL HIGH (ref 65–99)
POTASSIUM: 3.6 mmol/L (ref 3.5–5.1)
Sodium: 133 mmol/L — ABNORMAL LOW (ref 135–145)
TOTAL PROTEIN: 6.8 g/dL (ref 6.5–8.1)

## 2017-01-05 LAB — I-STAT TROPONIN, ED: Troponin i, poc: 0 ng/mL (ref 0.00–0.08)

## 2017-01-05 LAB — URINALYSIS, ROUTINE W REFLEX MICROSCOPIC
BILIRUBIN URINE: NEGATIVE
GLUCOSE, UA: NEGATIVE mg/dL
HGB URINE DIPSTICK: NEGATIVE
Ketones, ur: 5 mg/dL — AB
LEUKOCYTES UA: NEGATIVE
Nitrite: NEGATIVE
PROTEIN: NEGATIVE mg/dL
Specific Gravity, Urine: 1.017 (ref 1.005–1.030)
pH: 7 (ref 5.0–8.0)

## 2017-01-05 LAB — LIPASE, BLOOD: LIPASE: 21 U/L (ref 11–51)

## 2017-01-05 MED ORDER — ONDANSETRON HCL 4 MG/2ML IJ SOLN
4.0000 mg | Freq: Once | INTRAMUSCULAR | Status: AC
  Administered 2017-01-05: 4 mg via INTRAVENOUS

## 2017-01-05 MED ORDER — MECLIZINE HCL 12.5 MG PO TABS: 12.5000 mg | ORAL_TABLET | Freq: Three times a day (TID) | ORAL | 0 refills | Status: DC | PRN

## 2017-01-05 MED ORDER — SODIUM CHLORIDE 0.9 % IV BOLUS (SEPSIS)
1000.0000 mL | Freq: Once | INTRAVENOUS | Status: AC
  Administered 2017-01-05: 1000 mL via INTRAVENOUS

## 2017-01-05 MED ORDER — ONDANSETRON HCL 4 MG/2ML IJ SOLN
4.0000 mg | Freq: Once | INTRAMUSCULAR | Status: DC
  Filled 2017-01-05: qty 2

## 2017-01-05 MED ORDER — MECLIZINE HCL 25 MG PO TABS
12.5000 mg | ORAL_TABLET | Freq: Once | ORAL | Status: AC
  Administered 2017-01-05: 12.5 mg via ORAL
  Filled 2017-01-05: qty 1

## 2017-01-05 NOTE — ED Provider Notes (Signed)
Medical screening examination/treatment/procedure(s) were conducted as a shared visit with non-physician practitioner(s) and myself.  I personally evaluated the patient during the encounter.  Here with sensation of room spinning and one episode where she lost her balance associated with multiple episodes of vomiting over the last 24 hours. Similar to previous episode when she presented here and had a negative workup.  On my exam she has no nystagmus is asymptomatic while lying in bed. She does have some room spinning when you move her head quickly. No nausea or vomiting. Abdomen is benign. Lungs are clear to auscultation bilaterally heart rate is regular with normal rhythm. Neurologically, her cranial nerves are intact she has normal strength in upper extremities normal strength in lower extremities normal sensation in both upper and lower extremities as well. Suspect vertigo as a cause for her symptoms is no obvious signs of a central cause so we'll treat for peripheral vertigo and likely follow with ENT.    EKG Interpretation  Date/Time:  Tuesday January 05 2017 09:10:07 EDT Ventricular Rate:  69 PR Interval:  140 QRS Duration: 82 QT Interval:  380 QTC Calculation: 407 R Axis:   74 Text Interpretation:  Normal sinus rhythm Possible Anterior infarct , age undetermined Abnormal ECG No significant change since last tracing Confirmed by [facility] MD, Lurline Caver [phone]) on 01/05/2017 10:49:00 AM         Merrily Pew, MD 01/06/17 1950

## 2017-01-05 NOTE — ED Notes (Signed)
Pt's friend coming to pick up patient once she is discharged.

## 2017-01-05 NOTE — ED Notes (Signed)
Patient ambulated in hall. Pt swayed as she walked. Pt states that is abnormal for her.  Patient states that she feels really lethargic, weak, and dizzy.

## 2017-01-05 NOTE — ED Triage Notes (Signed)
c/o dizziness and nausea with unsteady gait onset early yest , states she feels confused at times , drove self to ED today.

## 2017-01-05 NOTE — Telephone Encounter (Signed)
Patient Name: Nicole Hess  DOB: 01/29/1946    Initial Comment Caller states she has been dizzy. Vomiting.    Nurse Assessment  Nurse: Leilani Merl, RN, Heather Date/Time (Eastern Time): 01/05/2017 8:14:19 AM  Confirm and document reason for call. If symptomatic, describe symptoms. ---Caller states that she is dizzy, it started yesterday, she was off balance, she was vomiting a lot yesterday. She was able to sleep last night, she last vomited in the middle of the night.  Does the patient have any new or worsening symptoms? ---Yes  Will a triage be completed? ---Yes  Related visit to physician within the last 2 weeks? ---No  Does the PT have any chronic conditions? (i.e. diabetes, asthma, etc.) ---Yes  List chronic conditions. ---See MR  Is this a behavioral health or substance abuse call? ---No     Guidelines    Guideline Title Affirmed Question Affirmed Notes  Dizziness - Vertigo [1] Dizziness (vertigo) present now AND [2] age > 67 (Exception: prior physician evaluation for this AND no different/worse than usual)    Final Disposition User   Go to ED Now (or PCP triage) Leilani Merl, RN, [facility] - ED   Disagree/Comply: Comply

## 2017-01-05 NOTE — Telephone Encounter (Signed)
Noted. Patient in ER.

## 2017-01-05 NOTE — ED Provider Notes (Signed)
[facility] DEPT Provider Note   CSN: [id number] Arrival date & time: 01/05/17  3664     History   Chief Complaint Chief Complaint  Patient presents with  . Dizziness    HPI Nicole Hess is a 71 y.o. female with pertinent past medical history of severe aortic stenosis status post AVR, heart murmur, known CAD, chronic fatigue presents to the emergency department with chief complaint of dizziness. Patient describes dizziness as "feeling unsteady on my feet", she had 2 episodes of this in the past 2 days. Symptoms are intermittent and short lasting. Symptoms are triggered by changes in position including head position changes and going from laying to sitting/standing. Patient notes she was bumping and walking into the walls when she tried to go from her bedroom to the bathroom this morning. Associated symptoms include nausea and vomiting. No severe/sudden headache, changes in vision, abdominal pain, chest pain, palpitations, shortness of breath, diarrhea, no bloody/dark stool. No recent head trauma or falls. Patient denies double vision, difficulty speaking or swallowing, focal extremity weakness. Patient drove herself to the emergency department this morning. No previous history of vertigo. No preceding URI symptoms. No recent medication changes. No ear pain or recent ear infection.  HPI  Past Medical History:  Diagnosis Date  . Abnormal Pap smear   . Anemia   . Anxiety   . Aortic stenosis   . Bicuspid aortic valve   . Coronary artery disease   . Depression   . Gout   . Heart murmur   . Heart valve problem    will have follow up Echo-Dr Jordan-will have replacement in a couple of years  . Leukopenia    Seen by Dr. Lamonte Sakai, present since 2008, watchful waiting  . Osteopenia   . Seizures ([place])    as a child  . Shortness of breath dyspnea     Patient Active Problem List   Diagnosis Date Noted  . 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 History of colonic polyps 11/09/2016  . S/P AVR (aortic valve replacement) 04/02/2015  . Aortic stenosis due to bicuspid aortic valve 02/26/2015  . Severe aortic stenosis 05/02/2014  . Precordial pain 01/26/2013  . Depression 03/30/2012  . Hyperlipidemia 03/30/2012    Past Surgical History:  Procedure Laterality Date  . AORTIC VALVE REPLACEMENT N/A 02/26/2015   Procedure: AORTIC VALVE REPLACEMENT (AVR);  Surgeon: Gaye Pollack, MD;  Location: [facility];  Service: Open Heart Surgery;  Laterality: N/A;  . CARDIAC CATHETERIZATION N/A 01/29/2015   Procedure: Right/Left Heart Cath and Coronary Angiography;  Surgeon: Peter M Martinique, MD;  Location: [facility];  Service: Cardiovascular;  Laterality: N/A;  . CERVICAL CONE BIOPSY    . COLONOSCOPY  2010  . DOPPLER ECHOCARDIOGRAPHY  3/14  . GUM SURGERY    . OOPHORECTOMY    . PARTIAL HYSTERECTOMY    . TEE WITHOUT CARDIOVERSION N/A 02/26/2015   Procedure: TRANSESOPHAGEAL ECHOCARDIOGRAM (TEE);  Surgeon: Gaye Pollack, MD;  Location: [facility];  Service: Open Heart Surgery;  Laterality: N/A;  . TUBAL LIGATION    . VAGINAL HYSTERECTOMY     for dysplasia    OB History    Gravida Para Term Preterm AB Living   0 0 0 0 0 0   SAB TAB Ectopic Multiple Live Births   0 0 0 0         Home Medications    Prior to Admission medications  Medication Sig Start Date End Date Taking? Authorizing Provider  Ascorbic Acid (VITAMIN C) 1000 MG tablet Take 1,000 mg by mouth daily.   Yes Historical Provider, MD  aspirin 81 MG tablet Take 81 mg by mouth daily.   Yes Historical Provider, MD  Calcium Carbonate-Vitamin D (CALTRATE 600+D PO) Take 1 tablet by mouth 2 (two) times daily.   Yes Historical Provider, MD  clindamycin (CLEOCIN) 300 MG capsule Take 600 mg ( 2 caps ) One hour before dental procedure 12/16/16  Yes Peter M Martinique, MD  Multiple Vitamins-Minerals (MULTIVITAMIN WOMEN) TABS Take 1 tablet by mouth daily.   Yes Historical Provider,  MD  Omega-3 Fatty Acids (OMEGA-3 FISH OIL PO) Take 1 capsule by mouth 2 (two) times daily.    Yes Historical Provider, MD  pravastatin (PRAVACHOL) 80 MG tablet Take 1 tablet (80 mg total) by mouth daily. 11/02/16  Yes Brunetta Jeans, PA-C  primidone (MYSOLINE) 250 MG tablet Take 1 tablet (250 mg total) by mouth 2 (two) times daily. 11/02/16  Yes Brunetta Jeans, PA-C  meclizine (ANTIVERT) 12.5 MG tablet Take 1 tablet (12.5 mg total) by mouth 3 (three) times daily as needed for dizziness. 01/05/17   Kinnie Feil, PA-C    Family History Family History  Problem Relation Age of Onset  . Multiple myeloma Mother   . Stroke Father   . Hypertension Father   . Heart disease Father 57  . Kidney disease Father   . Cancer Father   . Cancer Maternal Grandmother     renal  . Osteoarthritis Brother     Social History Social History  Substance Use Topics  . Smoking status: Never Smoker  . Smokeless tobacco: Never Used  . Alcohol use 4.2 oz/week    7 Glasses of wine per week     Comment: 1 glass of wine/cranberry juice a day     Allergies   Dextromethorphan; Pseudoephedrine; and Penicillins   Review of Systems Review of Systems  Constitutional: Positive for fatigue (chronic). Negative for appetite change, chills and fever.  HENT: Negative for congestion, ear discharge, ear pain, rhinorrhea and sore throat.   Eyes: Negative for photophobia and visual disturbance.  Respiratory: Negative for cough and shortness of breath.   Cardiovascular: Negative for chest pain and palpitations.  Gastrointestinal: Positive for nausea and vomiting. Negative for abdominal pain, constipation and diarrhea.  Genitourinary: Negative for difficulty urinating, dysuria, frequency and urgency.  Musculoskeletal: Negative for back pain and neck pain.  Skin: Negative for rash.  Neurological: Positive for dizziness and light-headedness. Negative for tremors, seizures, syncope, speech difficulty, weakness,  numbness and headaches.     Physical Exam Updated Vital Signs BP (!) 143/68   Pulse 72   Temp 98.3 F (36.8 C) (Oral)   Resp 13   Ht 5' 8"  (1.727 m)   Wt 65.8 kg   LMP 09/15/1987   SpO2 100%   BMI 22.05 kg/m   Physical Exam  Constitutional: She is oriented to person, place, and time. She appears well-developed and well-nourished.  Patient dry heaving   HENT:  Head: Normocephalic and atraumatic.  Nose: Nose normal.  Moist mucous membranes.  Oropharynx and tonsils normal TMs normal without bulging, erythema or cloudiness No mastoid tenderness  Eyes: Conjunctivae are normal.  PERRL and EOMs intact bilaterally No saccade correction or dolls eyes on head impulse  No vertical or horizontal nystagmus Normal vertical eye alignment with test of skew +Patient reported sensation of room spinning during  head impulse testing  Neck: Normal range of motion. Neck supple. No JVD present.  No neck rigidity  Cardiovascular: Normal rate, regular rhythm and intact distal pulses.   Murmur heard. Systolic heart murmur  Pulmonary/Chest: Effort normal and breath sounds normal. No respiratory distress. She has no wheezes. She has no rales.  Abdominal: Soft. Bowel sounds are normal. She exhibits no distension. There is no tenderness.  Musculoskeletal: Normal range of motion. She exhibits no deformity.  Lymphadenopathy:    She has no cervical adenopathy.  Neurological: She is alert and oriented to person, place, and time.  Pt is alert and oriented.   Speech and phonation normal.   Thought process coherent.   Strength 5/5 in upper and lower extremities.   Sensation to light touch intact in upper and lower extremities.  Gait normal.   Negative Romberg. No leg drift.   Intact finger to nose test. Intact heel to shin. CN I not tested CN II full visual fields  CN III, IV, VI PEERL and EOM intact CN V light touch intact in all 3 divisions of trigeminal nerve CN VII facial nerve movements  intact, symmetric CN VIII hearing intact to finger rub CN IX, X no uvula deviation, symmetric soft palate rise CN XI 5/5 SCM and trapezius strength  CN XII Tongue midline with symmetric L/R movement  Skin: Skin is warm and dry. Capillary refill takes less than 2 seconds.  No rashes  Psychiatric: She has a normal mood and affect. Her behavior is normal. Judgment and thought content normal.  Nursing note and vitals reviewed.    ED Treatments / Results  Labs (all labs ordered are listed, but only abnormal results are displayed) Labs Reviewed  COMPREHENSIVE METABOLIC PANEL - Abnormal; Notable for the following:       Result Value   Sodium 133 (*)    Chloride 99 (*)    Glucose, Bld 112 (*)    Calcium 8.6 (*)    All other components within normal limits  URINALYSIS, ROUTINE W REFLEX MICROSCOPIC - Abnormal; Notable for the following:    APPearance CLOUDY (*)    Ketones, ur 5 (*)    All other components within normal limits  LIPASE, BLOOD  I-STAT TROPOININ, ED    EKG  EKG Interpretation  Date/Time:  Tuesday January 05 2017 09:10:07 EDT Ventricular Rate:  69 PR Interval:  140 QRS Duration: 82 QT Interval:  380 QTC Calculation: 407 R Axis:   74 Text Interpretation:  Normal sinus rhythm Possible Anterior infarct , age undetermined Abnormal ECG No significant change since last tracing Confirmed by [facility] MD, Corene Cornea [phone]) on 01/05/2017 10:49:00 AM       Radiology No results found.  Procedures Procedures (including critical care time)  Medications Ordered in ED Medications  sodium chloride 0.9 % bolus 1,000 mL (0 mLs Intravenous Stopped 01/05/17 1248)  meclizine (ANTIVERT) tablet 12.5 mg (12.5 mg Oral Given 01/05/17 1110)  ondansetron (ZOFRAN) injection 4 mg (4 mg Intravenous Given 01/05/17 1102)     Initial Impression / Assessment and Plan / ED Course  I have reviewed the triage vital signs and the nursing notes.  Pertinent labs & imaging results that were available  during my care of the patient were reviewed by me and considered in my medical decision making (see chart for details).    71 y.o. yo female with pertinent pmh of severe aortic stenosis status post AVR, heart murmur, known CAD, chronic fatigue presents with intermittent episodes of "  dizziness" x 1-2 days.  Patient noticed symptoms when she tried to ambulate from bed to bathroom. Associated symptoms include nausea, vomiting, difficulty walking.  No red flag symptoms including focal weakness or sensory deficits to extremities, sudden onset/severe headache, associated LOC, pain anywhere, shortness of breath, chest pain, palpitations.  No known B12 deficiencies or thyroid conditions.  No new medications or medication changes.  Medications reviewed, none have adverse effects that could be contributing.  Neuro exam normal, HiNTS exam normal. Patient reported vertiginous symptoms with head rotation.  No diplopia, dysarthria, dysphagia, dysmetria, weakness or sensory loss to extremities.  I personally ambulated patient who had a steady gait without ataxia.  She did report sensation of room spinning while going from sitting to standing and turning head, no sudden onset of nausea or vomiting.  I did not appreciate any tremors or shakiness while walking. ENT exam normal.  VS reassuring.  No hyponatremia or other electrolyte abnormalities, TSH normal two weeks ago, troponin x 1 normal, EKG without arrythmias/ischemia, , low ketones and no nitrites in urine.   Patient is considered safe to discharge at this time.  I do not think further lab work or emergent imaging is indicated at this time.  Doubt central cause of symptoms including stroke or TIA.  Doubt cardiac arrhythmia, polypharmacy, vestibular syndrome.  Suspect BPPV.  Patient was given 1L IVF and meclizine in ED. Prior to discharge patient has reassuring vital signs.  Will discharge with close PCP and ENT f/u, strict ED return precautions.  Patient aware of red flag  symptoms to monitor for that would warrant return to ED. Patient drove to ED, I recommended she obtained a ride and avoid driving until she is evaluated by ENT/PCP.  Patient was driven home by friend.  Patient, ED treatment and discharge plan was discussed with supervising physician who is agreeable with plan.   Final Clinical Impressions(s) / ED Diagnoses   Final diagnoses:  Dizziness    New Prescriptions Discharge Medication List as of 01/05/2017  4:00 PM       Kinnie Feil, PA-C 01/05/17 1703    Merrily Pew, MD 01/06/17 1950

## 2017-01-05 NOTE — Discharge Instructions (Signed)
Your lab work was normal and reassuring today.  I suspect that your dizziness and sensation of room spinning is from vertigo.    Please take meclizine as prescribed for symptoms.  Avoid driving or any exertional activity that may exacerbate symptoms.   Contact your primary care provider TODAY and make an appointment within 1-2 days for re-evaluation.   Please contact Ear, Nose and Throat specialist for an evaluation within 7-10 days. I recommend that you avoid driving until you are evaluated by your primary care provider or ENT provider.   Return to the emergency department if your symptoms worsen, you have chest pain, shortness of breath, palpitations, or develop any other concerning symptoms.

## 2017-01-06 ENCOUNTER — Encounter: Payer: Self-pay | Admitting: Physician Assistant

## 2017-01-07 ENCOUNTER — Encounter: Payer: Self-pay | Admitting: Physician Assistant

## 2017-01-08 ENCOUNTER — Encounter: Payer: Self-pay | Admitting: Physician Assistant

## 2017-01-08 ENCOUNTER — Ambulatory Visit (INDEPENDENT_AMBULATORY_CARE_PROVIDER_SITE_OTHER): Payer: PPO | Admitting: Physician Assistant

## 2017-01-08 ENCOUNTER — Encounter: Payer: Self-pay | Admitting: Cardiology

## 2017-01-08 VITALS — BP 120/76 | HR 65 | Temp 98.2°F | Resp 14 | Ht 68.0 in | Wt 143.0 lb

## 2017-01-08 DIAGNOSIS — H8110 Benign paroxysmal vertigo, unspecified ear: Secondary | ICD-10-CM

## 2017-01-08 DIAGNOSIS — G3184 Mild cognitive impairment, so stated: Secondary | ICD-10-CM | POA: Diagnosis not present

## 2017-01-08 NOTE — Progress Notes (Signed)
Pre visit review using our clinic review tool, if applicable. No additional management support is needed unless otherwise documented below in the visit note. 

## 2017-01-08 NOTE — Patient Instructions (Signed)
I am glad you are feeling so much better.  Please continue the Meclizine as directed if needed for dizziness. Follow-up with Dr. Constance Holster on Monday as scheduled.  I am setting you up with Dr. Si Raider for some further testing of memory and cognition. They will call you for an appointment.

## 2017-01-08 NOTE — Progress Notes (Signed)
Patient presents to clinic today for ER follow-up. Patient presented to ER on 01/09/17 with c/o episodic dizziness. ER workup included unremarkable labs and EKG. Patient was diagnosed with BPPV and given meclizine with significant improvement. Was discharged home on meclizine and instruction to follow-up with PCP and ENT. Patient endorses taking Meclizine as directed when needed for vertigo spells. States spells are much less frequent and much less severe. Denies any new or worsening symptoms. Is hydrating well and getting plenty for rest. Has appointment with ENT (Dr. Constance Holster) scheduled for Monday.  Of note, patient has recently had significant other having concerns over her memory. She states she has not noticed any major memory issues recently but does state she can be forgetful about appointment times, etc. Previously had MMSE performed at her Medicare AWV with socre of 28/30. The only issue was with recall. She agrees to further testing in office today giving forgetfulness.   Past Medical History:  Diagnosis Date  . Abnormal Pap smear   . Anemia   . Anxiety   . Aortic stenosis   . Bicuspid aortic valve   . Coronary artery disease   . Depression   . Gout   . Heart murmur   . Heart valve problem    will have follow up Echo-Dr Jordan-will have replacement in a couple of years  . Leukopenia    Seen by Dr. Lamonte Sakai, present since 2008, watchful waiting  . Osteopenia   . Seizures ([place])    as a child  . Shortness of breath dyspnea     Current Outpatient Prescriptions on File Prior to Visit  Medication Sig Dispense Refill  . Ascorbic Acid (VITAMIN C) 1000 MG tablet Take 1,000 mg by mouth daily.    Marland Kitchen aspirin 81 MG tablet Take 81 mg by mouth daily.    . Calcium Carbonate-Vitamin D (CALTRATE 600+D PO) Take 1 tablet by mouth 2 (two) times daily.    . clindamycin (CLEOCIN) 300 MG capsule Take 600 mg ( 2 caps ) One hour before dental procedure 6 capsule 3  . meclizine (ANTIVERT) 12.5 MG tablet Take  1 tablet (12.5 mg total) by mouth 3 (three) times daily as needed for dizziness. 30 tablet 0  . Multiple Vitamins-Minerals (MULTIVITAMIN WOMEN) TABS Take 1 tablet by mouth daily.    . Omega-3 Fatty Acids (OMEGA-3 FISH OIL PO) Take 1 capsule by mouth 2 (two) times daily.     . pravastatin (PRAVACHOL) 80 MG tablet Take 1 tablet (80 mg total) by mouth daily. 30 tablet 3  . primidone (MYSOLINE) 250 MG tablet Take 1 tablet (250 mg total) by mouth 2 (two) times daily. 60 tablet 3   No current facility-administered medications on file prior to visit.     Allergies  Allergen Reactions  . Dextromethorphan Other (See Comments)    contraindication  . Pseudoephedrine Other (See Comments)    contriindication  . Penicillins Rash    Has patient had a PCN reaction causing immediate rash, facial/tongue/throat swelling, SOB or lightheadedness with hypotension: No Has patient had a PCN reaction causing severe rash involving mucus membranes or skin necrosis: No Has patient had a PCN reaction that required hospitalization No Has patient had a PCN reaction occurring within the last 10 years: No If all of the above answers are "NO", then may proceed with Cephalosporin use.    Family History  Problem Relation Age of Onset  . Multiple myeloma Mother   . Stroke Father   . Hypertension  Father   . Heart disease Father 92  . Kidney disease Father   . Cancer Father   . Cancer Maternal Grandmother     renal  . Osteoarthritis Brother     Social History   Social History  . Marital status: Single    Spouse name: N/A  . Number of children: N/A  . Years of education: N/A   Occupational History  . paralegal    Social History Main Topics  . Smoking status: Never Smoker  . Smokeless tobacco: Never Used  . Alcohol use 4.2 oz/week    7 Glasses of wine per week     Comment: 1 glass of wine/cranberry juice a day  . Drug use: No  . Sexual activity: No     Comment: [facility]   Other Topics Concern  . None    Social History Narrative  . None    Review of Systems - See HPI.  All other ROS are negative.  BP 120/76   Pulse 65   Temp 98.2 F (36.8 C) (Oral)   Resp 14   Ht _0  (1.727 m)   Wt 143 lb (64.9 kg)   LMP 09/15/1987   SpO2 99%   BMI 21.74 kg/m   Physical Exam  Constitutional: She is oriented to person, place, and time and well-developed, well-nourished, and in no distress.  HENT:  Head: Normocephalic and atraumatic.  Right Ear: External ear normal.  Left Ear: External ear normal.  Nose: Nose normal.  Mouth/Throat: Oropharynx is clear and moist. No oropharyngeal exudate.  Eyes: Conjunctivae and EOM are normal. Pupils are equal, round, and reactive to light.  Neck: Neck supple.  Cardiovascular: Normal rate, regular rhythm, normal heart sounds and intact distal pulses.   Pulmonary/Chest: Effort normal and breath sounds normal. No respiratory distress. She has no wheezes. She has no rales. She exhibits no tenderness.  Neurological: She is alert and oriented to person, place, and time. No cranial nerve deficit.  Skin: Skin is warm and dry. No rash noted.  Psychiatric: Affect normal.  Vitals reviewed.  Recent Results (from the past 2160 hour(s))  Basic metabolic panel     Status: None   Collection Time: 10/22/16 12:00 AM  Result Value Ref Range   BUN 21 4 - 21 mg/dL   Creatinine 0.8 0.5 - 1.1 mg/dL   Potassium 4.6 3.4 - 5.3 mmol/L   Sodium 141 137 - 147 mmol/L  Urine cytology ancillary only     Status: None   Collection Time: 11/06/16 12:00 AM  Result Value Ref Range   Chlamydia Negative     Comment: Normal Reference Range - Negative   Neisseria gonorrhea Negative     Comment: Normal Reference Range - Negative  HIV antibody     Status: None   Collection Time: 11/06/16 10:22 AM  Result Value Ref Range   HIV 1&2 Ab, 4th Generation NONREACTIVE NONREACTIVE    Comment:   HIV-1 antigen and HIV-1/HIV-2 antibodies were not detected.  There is no laboratory evidence of  HIV infection.   HIV-1/2 Antibody Diff        Not indicated. HIV-1 RNA, Qual TMA          Not indicated.     PLEASE NOTE: This information has been disclosed to you from records whose confidentiality may be protected by state law. If your state requires such protection, then the state law prohibits you from making any further disclosure of the information without the specific written  consent of the person to whom it pertains, or as otherwise permitted by law. A general authorization for the release of medical or other information is NOT sufficient for this purpose.   The performance of this assay has not been clinically validated in patients less than 80 years old.   For additional information please refer to http://education.quest[employer].com/faq/FAQ106.  (This link is being provided for informational/educational purposes only.)     RPR     Status: None   Collection Time: 11/06/16 10:22 AM  Result Value Ref Range   RPR Ser Ql NON REAC NON REAC  CBC     Status: None   Collection Time: 11/06/16 10:22 AM  Result Value Ref Range   WBC 4.1 4.0 - 10.5 K/uL   RBC 4.12 3.87 - 5.11 Mil/uL   Platelets 154.0 150.0 - 400.0 K/uL   Hemoglobin 13.4 12.0 - 15.0 g/dL   HCT 39.9 36.0 - 46.0 %   MCV 96.9 78.0 - 100.0 fl   MCHC 33.5 30.0 - 36.0 g/dL   RDW 13.0 11.5 - 15.5 %  Comprehensive metabolic panel     Status: None   Collection Time: 11/06/16 10:22 AM  Result Value Ref Range   Sodium 140 135 - 145 mEq/L   Potassium 4.0 3.5 - 5.1 mEq/L   Chloride 102 96 - 112 mEq/L   CO2 30 19 - 32 mEq/L   Glucose, Bld 89 70 - 99 mg/dL   BUN 23 6 - 23 mg/dL   Creatinine, Ser 0.83 0.40 - 1.20 mg/dL   Total Bilirubin 0.3 0.2 - 1.2 mg/dL   Alkaline Phosphatase 113 39 - 117 U/L   AST 37 0 - 37 U/L   ALT 27 0 - 35 U/L   Total Protein 7.7 6.0 - 8.3 g/dL   Albumin 4.5 3.5 - 5.2 g/dL   Calcium 9.4 8.4 - 10.5 mg/dL   GFR 72.47 >60.00 mL/min  Lipid panel     Status: Abnormal   Collection Time:  11/06/16 10:22 AM  Result Value Ref Range   Cholesterol 194 0 - 200 mg/dL    Comment: ATP III Classification       Desirable:  < 200 mg/dL               Borderline High:  200 - 239 mg/dL          High:  > = 240 mg/dL   Triglycerides 59.0 0.0 - 149.0 mg/dL    Comment: Normal:  <150 mg/dLBorderline High:  150 - 199 mg/dL   HDL 80.10 >39.00 mg/dL   VLDL 11.8 0.0 - 40.0 mg/dL   LDL Cholesterol 102 (H) 0 - 99 mg/dL   Total CHOL/HDL Ratio 2     Comment:                Men          Women1/2 Average Risk     3.4          3.3Average Risk          5.0          4.42X Average Risk          9.6          7.13X Average Risk          15.0          11.0  NonHDL 114.18     Comment: NOTE:  Non-HDL goal should be 30 mg/dL higher than patient's LDL goal (i.e. LDL goal of < 70 mg/dL, would have non-HDL goal of < 100 mg/dL)  TSH     Status: None   Collection Time: 11/06/16 10:22 AM  Result Value Ref Range   TSH 1.11 0.35 - 4.50 uIU/mL  Urinalysis, Routine w reflex microscopic     Status: None   Collection Time: 11/06/16 10:22 AM  Result Value Ref Range   Color, Urine YELLOW Yellow;Lt. Yellow   APPearance CLEAR Clear   Specific Gravity, Urine 1.010 1.000 - 1.030   pH 6.0 5.0 - 8.0   Total Protein, Urine NEGATIVE Negative   Urine Glucose NEGATIVE Negative   Ketones, ur NEGATIVE Negative   Bilirubin Urine NEGATIVE Negative   Hgb urine dipstick NEGATIVE Negative   Urobilinogen, UA 0.2 0.0 - 1.0   Leukocytes, UA NEGATIVE Negative   Nitrite NEGATIVE Negative   WBC, UA 0-2/hpf 0-2/hpf   RBC / HPF none seen 0-2/hpf  Hepatic function panel     Status: None   Collection Time: 11/09/16  8:04 AM  Result Value Ref Range   Total Bilirubin 0.3 0.2 - 1.2 mg/dL   Bilirubin, Direct 0.1 <=0.2 mg/dL   Indirect Bilirubin 0.2 0.2 - 1.2 mg/dL   Alkaline Phosphatase 110 33 - 130 U/L   AST 32 10 - 35 U/L   ALT 24 6 - 29 U/L   Total Protein 7.2 6.1 - 8.1 g/dL   Albumin 4.2 3.6 - 5.1 g/dL  Lipid  panel     Status: None   Collection Time: 11/09/16  8:04 AM  Result Value Ref Range   Cholesterol 189 <200 mg/dL   Triglycerides 100 <150 mg/dL   HDL 76 >50 mg/dL   Total CHOL/HDL Ratio 2.5 <5.0 Ratio   VLDL 20 <30 mg/dL   LDL Cholesterol 93 <100 mg/dL  CBG monitoring, ED     Status: Abnormal   Collection Time: 12/19/16  2:32 PM  Result Value Ref Range   Glucose-Capillary 108 (H) 65 - 99 mg/dL  Basic metabolic panel     Status: Abnormal   Collection Time: 12/19/16  3:00 PM  Result Value Ref Range   Sodium 139 135 - 145 mmol/L   Potassium 3.7 3.5 - 5.1 mmol/L   Chloride 101 101 - 111 mmol/L   CO2 28 22 - 32 mmol/L   Glucose, Bld 122 (H) 65 - 99 mg/dL   BUN 17 6 - 20 mg/dL   Creatinine, Ser 0.73 0.44 - 1.00 mg/dL   Calcium 8.9 8.9 - 10.3 mg/dL   GFR calc non Af Amer >60 >60 mL/min   GFR calc Af Amer >60 >60 mL/min    Comment: (NOTE) The eGFR has been calculated using the CKD EPI equation. This calculation has not been validated in all clinical situations. eGFR's persistently <60 mL/min signify possible Chronic Kidney Disease.    Anion gap 10 5 - 15  CBC     Status: Abnormal   Collection Time: 12/19/16  3:00 PM  Result Value Ref Range   WBC 4.2 4.0 - 10.5 K/uL   RBC 4.03 3.87 - 5.11 MIL/uL   Hemoglobin 12.9 12.0 - 15.0 g/dL   HCT 38.4 36.0 - 46.0 %   MCV 95.3 78.0 - 100.0 fL   MCH 32.0 26.0 - 34.0 pg   MCHC 33.6 30.0 - 36.0 g/dL   RDW 12.6 11.5 - 15.5 %  Platelets 149 (L) 150 - 400 K/uL  Urinalysis, Routine w reflex microscopic     Status: Abnormal   Collection Time: 12/19/16  3:26 PM  Result Value Ref Range   Color, Urine YELLOW YELLOW   APPearance HAZY (A) CLEAR   Specific Gravity, Urine 1.015 1.005 - 1.030   pH 7.0 5.0 - 8.0   Glucose, UA NEGATIVE NEGATIVE mg/dL   Hgb urine dipstick NEGATIVE NEGATIVE   Bilirubin Urine NEGATIVE NEGATIVE   Ketones, ur NEGATIVE NEGATIVE mg/dL   Protein, ur NEGATIVE NEGATIVE mg/dL   Nitrite NEGATIVE NEGATIVE   Leukocytes, UA  NEGATIVE NEGATIVE  Hepatic function panel     Status: Abnormal   Collection Time: 12/19/16  3:30 PM  Result Value Ref Range   Total Protein 7.0 6.5 - 8.1 g/dL   Albumin 3.4 (L) 3.5 - 5.0 g/dL   AST 38 15 - 41 U/L   ALT 25 14 - 54 U/L   Alkaline Phosphatase 114 38 - 126 U/L   Total Bilirubin 0.3 0.3 - 1.2 mg/dL   Bilirubin, Direct <0.1 (L) 0.1 - 0.5 mg/dL   Indirect Bilirubin NOT CALCULATED 0.3 - 0.9 mg/dL  Lipase, blood     Status: None   Collection Time: 12/19/16  3:30 PM  Result Value Ref Range   Lipase 13 11 - 51 U/L  TSH     Status: None   Collection Time: 12/19/16  3:30 PM  Result Value Ref Range   TSH 0.740 0.350 - 4.500 uIU/mL    Comment: Performed by a 3rd Generation assay with a functional sensitivity of <=0.01 uIU/mL.  I-Stat CG4 Lactic Acid, ED     Status: None   Collection Time: 12/19/16  7:27 PM  Result Value Ref Range   Lactic Acid, Venous 0.88 0.5 - 1.9 mmol/L  Comprehensive metabolic panel     Status: Abnormal   Collection Time: 01/05/17 10:57 AM  Result Value Ref Range   Sodium 133 (L) 135 - 145 mmol/L   Potassium 3.6 3.5 - 5.1 mmol/L   Chloride 99 (L) 101 - 111 mmol/L   CO2 24 22 - 32 mmol/L   Glucose, Bld 112 (H) 65 - 99 mg/dL   BUN 15 6 - 20 mg/dL   Creatinine, Ser 0.51 0.44 - 1.00 mg/dL   Calcium 8.6 (L) 8.9 - 10.3 mg/dL   Total Protein 6.8 6.5 - 8.1 g/dL   Albumin 3.5 3.5 - 5.0 g/dL   AST 34 15 - 41 U/L   ALT 22 14 - 54 U/L   Alkaline Phosphatase 110 38 - 126 U/L   Total Bilirubin 0.5 0.3 - 1.2 mg/dL   GFR calc non Af Amer >60 >60 mL/min   GFR calc Af Amer >60 >60 mL/min    Comment: (NOTE) The eGFR has been calculated using the CKD EPI equation. This calculation has not been validated in all clinical situations. eGFR's persistently <60 mL/min signify possible Chronic Kidney Disease.    Anion gap 10 5 - 15  Lipase, blood     Status: None   Collection Time: 01/05/17 10:57 AM  Result Value Ref Range   Lipase 21 11 - 51 U/L  I-Stat Troponin, ED  (not at [facility])     Status: None   Collection Time: 01/05/17 11:19 AM  Result Value Ref Range   Troponin i, poc 0.00 0.00 - 0.08 ng/mL   Comment 3            Comment: Due to  the release kinetics of cTnI, a negative result within the first hours of the onset of symptoms does not rule out myocardial infarction with certainty. If myocardial infarction is still suspected, repeat the test at appropriate intervals.   Urinalysis, Routine w reflex microscopic     Status: Abnormal   Collection Time: 01/05/17 12:50 PM  Result Value Ref Range   Color, Urine YELLOW YELLOW   APPearance CLOUDY (A) CLEAR   Specific Gravity, Urine 1.017 1.005 - 1.030   pH 7.0 5.0 - 8.0   Glucose, UA NEGATIVE NEGATIVE mg/dL   Hgb urine dipstick NEGATIVE NEGATIVE   Bilirubin Urine NEGATIVE NEGATIVE   Ketones, ur 5 (A) NEGATIVE mg/dL   Protein, ur NEGATIVE NEGATIVE mg/dL   Nitrite NEGATIVE NEGATIVE   Leukocytes, UA NEGATIVE NEGATIVE    Assessment/Plan: 1. Benign paroxysmal positional vertigo, unspecified laterality Improving. F/U scheduled with ENT Monday. Continue meclizine. Supportive measures reviewed.  2. Mild cognitive impairment with memory loss Issue with recall on MMSE previously and on [facility] Cognitive Assessment today. Referral to Dr. Lenetta Quaker for neurocognitive testing placed.  - Ambulatory referral to Neurology   Leeanne Rio, PA-C

## 2017-01-08 NOTE — Telephone Encounter (Signed)
Please see other MyChart message.   I have spoken with patient to clarify things discussed at visit. Spoke to her via phone and also sent MyChart message per her request.  (1) She was not told she could not drive anymore, only that while she is recovering from BPPV that she not drive if she is feeling dizzy. (2) She is being set up with Dr. Si Raider with [facility] for further cognitive testing due to memory issues noted by our clinic and on mini cognitive assessments in office.

## 2017-01-10 ENCOUNTER — Encounter: Payer: Self-pay | Admitting: Physician Assistant

## 2017-01-11 ENCOUNTER — Telehealth: Payer: Self-pay

## 2017-01-11 ENCOUNTER — Other Ambulatory Visit: Payer: Self-pay | Admitting: Physician Assistant

## 2017-01-11 ENCOUNTER — Encounter: Payer: Self-pay | Admitting: Physician Assistant

## 2017-01-11 DIAGNOSIS — H6123 Impacted cerumen, bilateral: Secondary | ICD-10-CM | POA: Diagnosis not present

## 2017-01-11 DIAGNOSIS — H8120 Vestibular neuronitis, unspecified ear: Secondary | ICD-10-CM | POA: Diagnosis not present

## 2017-01-11 MED ORDER — MECLIZINE HCL 12.5 MG PO TABS: 12.5000 mg | ORAL_TABLET | Freq: Three times a day (TID) | ORAL | 0 refills | Status: DC | PRN

## 2017-01-11 NOTE — Telephone Encounter (Signed)
Spoke to patient about recent emails.Stated PCP advised her to see Dr.Mary Hayes Ludwig.Patient encouraged to keep that appointment.Stated her recent vertigo is better.Patient was reassured.

## 2017-01-13 ENCOUNTER — Telehealth: Payer: Self-pay | Admitting: Cardiology

## 2017-01-13 NOTE — Telephone Encounter (Signed)
Patient calling for Malachy Mood and would like her to look at her appointments for tomorrow on her MyChart. Thanks.

## 2017-01-13 NOTE — Telephone Encounter (Signed)
Returned call to patient advised to keep appointment Dr.Bailer 01/14/17 at 11:00 am.

## 2017-01-14 ENCOUNTER — Ambulatory Visit (INDEPENDENT_AMBULATORY_CARE_PROVIDER_SITE_OTHER): Payer: PPO | Admitting: Psychology

## 2017-01-14 ENCOUNTER — Encounter: Payer: Self-pay | Admitting: Psychology

## 2017-01-14 ENCOUNTER — Encounter: Payer: Self-pay | Admitting: Physician Assistant

## 2017-01-14 DIAGNOSIS — R413 Other amnesia: Secondary | ICD-10-CM | POA: Diagnosis not present

## 2017-01-14 NOTE — Progress Notes (Signed)
   Neuropsychology Note  Nicole Hess came in today for 1 hour of neuropsychological testing with technician, Milana Kidney, BS, under the supervision of Dr. Macarthur Critchley. The patient did not appear overtly distressed by the testing session, per behavioral observation or via self-report to the technician. Rest breaks were offered. [name] will return within 2 weeks for a feedback session with Dr. Si Raider at which time her test performances, clinical impressions and treatment recommendations will be reviewed in detail. The patient understands she can contact our office should she require our assistance before this time.  Full report to follow.

## 2017-01-14 NOTE — Progress Notes (Signed)
NEUROPSYCHOLOGICAL INTERVIEW (CPT: [id number])  Name: Nicole Hess Date of Birth: 10-Apr-1946 Date of Interview: 01/14/2017  Reason for Referral:  Nicole Hess is a 70 y.o. female who is referred for neuropsychological evaluation by Elyn Aquas, PA-C, of [facility] due to concerns about memory loss. This patient is unaccompanied in the office for today's appointment.  History of Presenting Problem:  Nicole Hess was last seen by her primary care provider, Elyn Aquas, on 01/08/2017 following ER visit on 01/05/2017 for dizziness. [place] on 01/08/17 was 23/30. MMSE at he Medicare wellness visit 11/06/2016 was 28/30.   At today's visit, the patient denies having any concerns about her memory or cognitive functioning. Furthermore, upon direct questioning, she denied all of the following symptoms: forgetting recent conversations/events, repeating statements/questions, misplacing/losing item, forgetting appointments or other obligations, forgetting to take medications, difficulty concentrating, starting but not finishing tasks, word-finding difficulty, writing difficulty, spelling difficulty, comprehension difficulty, or getting lost when driving.   The patient lives alone in an apartment at [place]. She states she has been living there for 2 1/2 years. She manages all instrumental ADLs independently, including driving, medications, finances, appointments and cooking. She has never been married and has no children. She has one sibling, a brother, but she states "he doesn't like to keep in touch". She reports she has a boyfriend but provides a somewhat odd history of their relationship. She stated that he used to live in [place] but now lives in [place] and they have only seen each other once but they email and all through the day. She stated that she has friends but later stated she does not really socialize and instead goes shopping each day as a way to get out.  She also tries to walk a mile daily, and she enjoys reading.  She reported having a lot of fear about her heart and having to have heart surgery, since she underwent heart valve replacement surgery in 02/2015. She stated that she does not like to drive because she fears she will "get hit and it will damage my chest". She denied depressed mood and stated that her mood is good. She denied any sleep difficulty or change in appetite. She denied psychiatric history including hallucinations and suicidal ideation.  She reported she recently experienced acute onset of dizziness and nausea/vomiting and was seen in the ER for this in April. This has improved but she still feels somewhat unsteady on her feet. She is taking meclizine for the dizziness.   She has no history of falls or head injury.  No family history of dementia.  Of note the patient reported a childhood history of "blacking out". This came up when I asked her the reason she takes primidone. 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 She then explained that she would "black out a few times" when she was a teenager, or at least this is what her parents had told her. She saw a neurologist Dr. Lupita Shutter at some point in her adolescence or perhaps afterwards.     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 She had to retire in 2016 after her heart valve surgery. Marital history: Single, never married. No children. Alcohol/Tobacco/Substances: She reported she has at least one glass of wine nightly. When asked if she ever has more than 2 glasses of wine on  a given night, she paused before answering no and then asked "Would it be bad?"  Never a smoker. No hx substance abuse/dependence.   Medical History: Past Medical History:  Diagnosis Date  . Abnormal Pap smear   . Anemia   . Anxiety   . Aortic  stenosis   . Bicuspid aortic valve   . Coronary artery disease   . Depression   . Gout   . Heart murmur   . Heart valve problem    will have follow up Echo-Dr Jordan-will have replacement in a couple of years  . Leukopenia    Seen by Dr. Lamonte Sakai, present since 2008, watchful waiting  . Osteopenia   . Seizures ([place])    as a child  . Shortness of breath dyspnea      Current Medications:  Outpatient Encounter Prescriptions as of 01/14/2017  Medication Sig  . Ascorbic Acid (VITAMIN C) 1000 MG tablet Take 1,000 mg by mouth daily.  Marland Kitchen aspirin 81 MG tablet Take 81 mg by mouth daily.  . Calcium Carbonate-Vitamin D (CALTRATE 600+D PO) Take 1 tablet by mouth 2 (two) times daily.  . clindamycin (CLEOCIN) 300 MG capsule Take 600 mg ( 2 caps ) One hour before dental procedure  . meclizine (ANTIVERT) 12.5 MG tablet Take 1 tablet (12.5 mg total) by mouth 3 (three) times daily as needed for dizziness.  . Multiple Vitamins-Minerals (MULTIVITAMIN WOMEN) TABS Take 1 tablet by mouth daily.  . Omega-3 Fatty Acids (OMEGA-3 FISH OIL PO) Take 1 capsule by mouth 2 (two) times daily.   . pravastatin (PRAVACHOL) 80 MG tablet Take 1 tablet (80 mg total) by mouth daily.  . primidone (MYSOLINE) 250 MG tablet Take 1 tablet (250 mg total) by mouth 2 (two) times daily.   No facility-administered encounter medications on file as of 01/14/2017.     Behavioral Observations:   Appearance: Neatly and appropriately dressed and groomed Gait: Ambulated independently, mild unsteadiness observed Speech: Fluent; normal rate, rhythm and volume Thought process: Tangential. Often makes off-topic statements, perhaps reflecting loose associations. Affect: Full, euthymic Interpersonal: Pleasant. Unclear if she is a good historian. Sometimes contradicted herself and previous statements she had made.   TESTING: There is medical necessity to proceed with neuropsychological assessment as the results will be used to aid in differential  diagnosis and clinical decision-making and to inform specific treatment recommendations. Per the patient and medical records reviewed, there has been a change in cognitive functioning and a reasonable suspicion of dementia. Following the clinical interview, she completed a full battery of neuropsychological testing with my psychometrician under my supervision.   PLAN: She will see me within 1-3 weeks for a follow-up session at which time her test performances and my impressions and treatment recommendations will be reviewed in detail.   Full neuropsychological evaluation report to follow.

## 2017-01-16 ENCOUNTER — Encounter: Payer: Self-pay | Admitting: Obstetrics & Gynecology

## 2017-01-18 ENCOUNTER — Telehealth: Payer: Self-pay | Admitting: Physician Assistant

## 2017-01-18 ENCOUNTER — Telehealth: Payer: Self-pay

## 2017-01-18 NOTE — Telephone Encounter (Signed)
Called patient but unable to leave a message about EOB and questionable lab test.(RPR, HIV) Discussed at [place] on 11/06/16 CPE Worsened due to digital insertion contributing to mild impaction. Bowel regimen reviewed with patient. Giving history had long discussion with patient about safe sexual practices, especially with a new partner, regardless of age. Denies concern for STI but has never been tested. Agrees to testing today to get a baseline so she can give her partner a clean bill of health.  Patient Instructions (the written plan) was given to the patient.

## 2017-01-18 NOTE — Telephone Encounter (Signed)
Pt states that she received an EOB from insurance regarding testing and has a question. Pt would like a call back

## 2017-01-18 NOTE — Telephone Encounter (Signed)
Please see telephone encounter dated with today's date. 

## 2017-01-18 NOTE — Telephone Encounter (Signed)
Spoke with patient. Patient states that she received a letter from [facility] with all her recent lad testing listed. States on 11/06/2016 it has a RPR and HIV antibody listed. Patient is asking who performed these. Advised per our records Raiford Noble, [place] performed these on 11/06/2016. Patient verbalizes understanding and will contact his office with any further questions.  Non-Urgent Medical Question  Message [id number]  From STAVROULA ROHDE To Megan Salon, MD Sent 01/16/2017 7:48 PM  [name], would you please call me when you get time. [phone]   Responsible Party   Pool - [facility] Clinical Pool No one has taken responsibility for this message.  No actions have been taken on this message.   Routing to provider for final review. Patient agreeable to disposition. Will close encounter.

## 2017-01-18 NOTE — Telephone Encounter (Signed)
She has seen ENT for this so should be following their recommendations. However if she is not having any symptoms, there is no need for her to be taking the Meclizine TID only as needed if she has a recurrence. No refills from [place]. These are to come from ENT.

## 2017-01-18 NOTE — Telephone Encounter (Signed)
Advised patient that Nicole Hess did order the HIV and RPR testing at [facility]. She understands the testing to get a baseline since having a new partner. Patient did have questions about continuing the Meclizine 12.5 mg. She is currently taking the medication tid after meals for the vertigo. She denies any reoccurring symptoms.  Please advise about refilling at current directions or to start cutting back on dose.

## 2017-01-19 ENCOUNTER — Other Ambulatory Visit: Payer: Self-pay | Admitting: Physician Assistant

## 2017-01-19 ENCOUNTER — Encounter: Payer: Self-pay | Admitting: Physician Assistant

## 2017-01-19 NOTE — Telephone Encounter (Signed)
Advised patient she shouldn't have to take the Meclizine on a regular basics. Only to take as needed. She should contact ENT to refill medication. She is agreeable.

## 2017-01-21 ENCOUNTER — Encounter: Payer: PPO | Admitting: Psychology

## 2017-01-26 ENCOUNTER — Encounter: Payer: Self-pay | Admitting: Cardiology

## 2017-01-26 DIAGNOSIS — H04123 Dry eye syndrome of bilateral lacrimal glands: Secondary | ICD-10-CM | POA: Diagnosis not present

## 2017-01-26 DIAGNOSIS — S0501XA Injury of conjunctiva and corneal abrasion without foreign body, right eye, initial encounter: Secondary | ICD-10-CM | POA: Diagnosis not present

## 2017-01-26 DIAGNOSIS — H18421 Band keratopathy, right eye: Secondary | ICD-10-CM | POA: Diagnosis not present

## 2017-02-02 DIAGNOSIS — S0502XA Injury of conjunctiva and corneal abrasion without foreign body, left eye, initial encounter: Secondary | ICD-10-CM | POA: Diagnosis not present

## 2017-02-02 DIAGNOSIS — H18421 Band keratopathy, right eye: Secondary | ICD-10-CM | POA: Diagnosis not present

## 2017-02-02 DIAGNOSIS — H04123 Dry eye syndrome of bilateral lacrimal glands: Secondary | ICD-10-CM | POA: Diagnosis not present

## 2017-02-03 NOTE — Progress Notes (Signed)
NEUROPSYCHOLOGICAL EVALUATION   Name:    Nicole Hess  Date of Birth:   June 22, 1946 Date of Interview:  01/14/2017 Date of Testing:  01/14/2017   Date of Feedback:  02/04/2017       Background Information:  Reason for Referral:  Nicole Hess is a 71 y.o. female referred by Elyn Aquas, PA-C, to assess her current level of cognitive functioning and assist in differential diagnosis. The current evaluation consisted of a review of available medical records, an interview with the patient, and the completion of a neuropsychological testing battery. Informed consent was obtained.  History of Presenting Problem:  [name] was last seen by her primary care provider, Elyn Aquas, on 01/08/2017 following ER visit on 01/05/2017 for dizziness. [place] on 01/08/17 was 23/30. MMSE at her Medicare wellness visit 11/06/2016 was 28/30.   At today's visit, the patient denies having any concerns about her memory or cognitive functioning. Furthermore, upon direct questioning, she denied all of the following symptoms: forgetting recent conversations/events, repeating statements/questions, misplacing/losing item, forgetting appointments or other obligations, forgetting to take medications, difficulty concentrating, starting but not finishing tasks, word-finding difficulty, writing difficulty, spelling difficulty, comprehension difficulty, or getting lost when driving.   The patient lives alone in an apartment at [facility]. She states she has been living there for 2 1/2 years. She manages all instrumental ADLs independently, including driving, medications, finances, appointments and cooking. She has never been married and has no children. She has one sibling, a brother, but she states "he doesn't like to keep in touch". She reports she has a boyfriend but provides a somewhat odd history of their relationship. She stated that he used to live in [place] but now lives in [place] and they have only seen  each other once but they email and call through the day. She stated that she has friends but later stated she does not really socialize and instead goes shopping each day as a way to get out. She also tries to walk a mile daily, and she enjoys reading.  She reported having a lot of fear about her heart and having to have heart surgery, since she underwent heart valve replacement surgery in 02/2015. She stated that she does not like to drive because she fears she will "get hit and it will damage my chest". She denied depressed mood and stated that her mood is good. She denied any sleep difficulty or change in appetite. She denied psychiatric history including hallucinations and suicidal ideation.  She reported she recently experienced acute onset of dizziness and nausea/vomiting and was seen in the ER for this in April. This has improved but she still feels somewhat unsteady on her feet. She is taking meclizine for the dizziness.   She has no history of falls or head injury.  No family history of dementia.  Of note the patient reported a childhood history of "blacking out". This came up when I asked her the reason she takes primidone. 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 She then explained that she would "black out a few times" when she was a teenager, or at least this is what her parents had told her. She saw a neurologist Dr. Lupita Shutter at some point in her adolescence or perhaps afterwards.     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 She had  to retire in 2016 after her heart valve surgery. Marital history: Single, never married. No children. Alcohol/Tobacco/Substances: She reported she has at least one glass of wine nightly. When asked if she ever has more than 2 glasses of wine on a given night, she paused before answering no and then asked  "Would it be bad?"  Never a smoker. No hx substance abuse/dependence.   Medical History:  Past Medical History:  Diagnosis Date  . Abnormal Pap smear   . Anemia   . Anxiety   . Aortic stenosis   . Bicuspid aortic valve   . Coronary artery disease   . Depression   . Gout   . Heart murmur   . Heart valve problem    will have follow up Echo-Dr Jordan-will have replacement in a couple of years  . Leukopenia    Seen by Dr. Lamonte Sakai, present since 2008, watchful waiting  . Osteopenia   . Seizures ([place])    as a child  . Shortness of breath dyspnea     Current medications:  Outpatient Encounter Prescriptions as of 02/04/2017  Medication Sig  . Ascorbic Acid (VITAMIN C) 1000 MG tablet Take 1,000 mg by mouth daily.  Marland Kitchen aspirin 81 MG tablet Take 81 mg by mouth daily.  . Calcium Carbonate-Vitamin D (CALTRATE 600+D PO) Take 1 tablet by mouth 2 (two) times daily.  . clindamycin (CLEOCIN) 300 MG capsule Take 600 mg ( 2 caps ) One hour before dental procedure  . Multiple Vitamins-Minerals (MULTIVITAMIN WOMEN) TABS Take 1 tablet by mouth daily.  . Omega-3 Fatty Acids (OMEGA-3 FISH OIL PO) Take 1 capsule by mouth 2 (two) times daily.   . pravastatin (PRAVACHOL) 80 MG tablet Take 1 tablet (80 mg total) by mouth daily.  . primidone (MYSOLINE) 250 MG tablet Take 1 tablet (250 mg total) by mouth 2 (two) times daily.   No facility-administered encounter medications on file as of 02/04/2017.      Current Examination:  Behavioral Observations:   Appearance: Neatly and appropriately dressed and groomed Gait: Ambulated independently, mild unsteadiness observed Speech: Fluent; normal rate, rhythm and volume Thought process: Tangential. Often makes off-topic statements, perhaps reflecting loose associations. Affect: Full, euthymic Interpersonal: Pleasant. Mildly disinhibited. Does not appear to be an accurate historian as she regularly contradicted herself and previous statements she had made. Of note,  upon following up with me on 02/04/2017, the patient did not think she had ever met me before (our first appointment was three weeks earlier). Orientation: Oriented to all spheres. Accurately named the current President and his predecessor.  Tests Administered: . Test of Premorbid Functioning (TOPF) . Wechsler Adult Intelligence Scale-Fourth Edition (WAIS-IV): Similarities, Music therapist, Coding and Digit Span subtests . Wechsler Memory Scale-Fourth Edition (WMS-IV) Older Adult Version (ages 60-90): Logical Memory I, II and Recognition subtests  . [employer] Verbal Learning Test - 2nd Edition (CVLT-2) Short Form . Repeatable Battery for the Assessment of Neuropsychological Status (RBANS) Form A:  Figure Copy and Recall subtests and Semantic Fluency subtest . Neuropsychological Assessment Battery (NAB) Language Module, Form 1: Naming subtest . Boston Diagnostic Aphasia Examination: Complex Ideational Material subtest . Controlled Oral Word Association Test (COWAT) . Trail Making Test A and B . Clock drawing test . Geriatric Depression Scale (GDS) 15 Item . Generalized Anxiety Disorder - 7 item screener (GAD-7) . Beck Anxiety Inventory (BAI)  Test Results: Note: Standardized scores are presented only for use by appropriately trained professionals and to allow for any future test-retest  comparison. These scores should not be interpreted without consideration of all the information that is contained in the rest of the report. The most recent standardization samples from the test publisher or other sources were used whenever possible to derive standard scores; scores were corrected for age, gender, ethnicity and education when available.   Test Scores:  Test Name Raw Score Standardized Score Descriptor  TOPF 34/70 SS= 94 Average  WAIS-IV Subtests     Similarities 13/36 ss= 5 Borderline  Block Design 24/66 ss= 8 Low end of average  Coding 34/135 ss= 7 Low average  Digit Span Forward 9/16 ss= 9  Average  Digit Span Backward 7/16 ss= 9 Average  WMS-IV Subtests     LM I 14/53 ss= 4 Impaired  LM II 0/39 ss= 1 Impaired  LM II Recognition 13/23 Cum %: 3-9   RBANS Subtests     Figure Copy 17/20 Z= -0.4 Average  Figure Recall 0/20 Z= -3 Severely impaired  Semantic Fluency 9/40 Z= -2.1 Impaired  CVLT-II Scores     Trial 1 3/9 Z= -2.5 Impaired  Trial 4 3/9 Z= -3 Severely impaired  Trials 1-4 total14 14/36 T= 19 Severely impaired  SD Free Recall 2/9 Z= -3 Severely impaired  LD Free Recall 0/9 Z= -2.5 Impaired  LD Cued Recall 1/9 Z= -3.5 Severely impaired  Recognition Discriminability 9/9 hits, 9 false positives Z= -1.5 Borderline  Forced Choice Recognition 9/9  WNL  NAB Language subtests     Naming 29/31 T= 46 Average  BDAE Subtest     Complex Ideational Material 9/12  Impaired  COWAT-FAS 22 T= 39 Low average  COWAT-Animals 9 T= 33 Borderline  Trail Making Test A  43" 0 errors T= 49 Average  Trail Making Test B  293" 3 errors T= 25 Impaired  Clock Drawing   Impaired  GDS-15 2/15  WNL  GAD-7 6/21  Mild  BAI 6/63  WNL      Description of Test Results:  Premorbid verbal intellectual abilities were estimated to have been within the average range based on a test of word reading. Psychomotor processing speed was low average. Auditory attention and working memory were average. Visual-spatial construction was average. Language abilities were somewhat variable. Specifically, confrontation naming was average, while semantic verbal fluency was impaired. Auditory comprehension of complex ideational material was impaired. With regard to verbal memory, encoding and acquisition of non-contextual information (i.e., word list) was severely impaired. After a brief distracter task, free recall was severely impaired (2/9 items recalled). After a delay, free recall was impaired (0/9 items recalled). Cued recall was severely impaired (1/9 items recalled). Performance on a yes/no recognition task  was impaired due to significantly increased number of false positive errors. On another verbal memory test, encoding and acquisition of contextual auditory information (i.e., short stories) was impaired. After a delay, free recall was severely impaired. Performance on a yes/no recognition task was impaired. With regard to non-verbal memory, delayed free recall of visual information was severely impaired. Executive functioning was largely below expectation for age. Mental flexibility and set-shifting were impaired on Trails B. Verbal fluency with phonemic search restrictions was low average. Verbal abstract reasoning was borderline impaired. Performance on a clock drawing task was impaired. On a self-report measure of mood, the patient's responses were not indicative of clinically significant depression at the present time. On self-report measures of anxiety, the patient endorsed mild generalized anxiety at the present time, characterized by worrying excessively, difficulty relaxing, nervousness and inability  to control worrying.   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 Results of the current cognitive evaluation reveal several areas of impairment, including verbal and non-verbal learning and memory, semantic fluency, and executive functioning. Unfortunately there is no collateral report available regarding any change in daily functioning. While she denies any change in her ability to manage complex ADLs, behavioral observations along with her test results indicate that her cognitive deficits are likely interfering with her daily functioning. As such, diagnostic criteria for a dementia syndrome are met.  The patient's cognitive profile is suggestive of medial-temporal lobe involvement. Alzheimer's disease is the most likely etiology, given her cognitive profile, age and clinical features, but it would be helpful to know observed time course  of cognitive symptoms (would expect gradual onset with slow progressive worsening in AD). She is also reporting mild generalized anxiety and this was also evident during the clinical interview. This likely represents longstanding anxiety disorder exacerbated by dementia.  Recommendations: Based on the findings of the present evaluation, the following recommendations are offered:  1. From a neuropsychological perspective, the patient is considered to be an appropriate candidate for cholinesterase inhibitor therapy. She will follow up with her PCP about this. 2. The patient would benefit from social work evaluation to determine if she needs assistance with complex ADLs, including finances, appointments, medications, cooking and transportation. Ideally, this assessment would be done in her home environment. She should consider transitioning to a continuing care retirement community with assisted living and (for the future) memory care, if possible.  3. I encouraged the patient to assign PoA for finances and healthcare at this time. At her follow up session with me, the patient reported that she does have two individuals who she trusts who help her with her finances (or at least are on her accounts): Alleen Borne (patient states she is an Financial planner with [employer]) and Kathrynn Ducking (patient states this is her first cousin who is a Sports coach in [place]). 4. Given her poor performance on a cognitive test highly correlated with driving ability, it is my recommendation that she undergo formal driving evaluation if she wishes to continue driving.  5. Education regarding dementia and Alzheimer's disease was provided to the patient.  She did not appear upset by the news that she likely has a neurodegenerative dementia. 5. The patient should continue to participate in activities which provide mental stimulation, safe cardiovascular exercise, and social interaction.  6. MRI of the brain could be  considered if there is any concern of acute abnormality. 7. Psychopharmacological treatment for anxiety could be considered. She may benefit from seeing a geriatric psychiatrist such as Dr. Norma Fredrickson for evaluation/management of anxiety.   Feedback to Patient: LORILEE CAFARELLA returned for a feedback appointment on 02/04/2017 to review the results of her neuropsychological evaluation with this provider. 20 minutes face-to-face time was spent reviewing her test results, my impressions and my recommendations as detailed above.    Total time spent on this patient's case: [id number] unit for interview with psychologist; [phone] units of testing by psychometrician under psychologist's supervision; [phone] units for medical record review, scoring of neuropsychological tests, interpretation of test results, preparation of this report, and review of results to the patient by psychologist.      Thank you for your referral of BATSHEVA STEVICK. Please feel free to contact me if you have any questions or concerns regarding this report.

## 2017-02-04 ENCOUNTER — Encounter: Payer: Self-pay | Admitting: Physician Assistant

## 2017-02-04 ENCOUNTER — Encounter: Payer: Self-pay | Admitting: Psychology

## 2017-02-04 ENCOUNTER — Encounter: Payer: Self-pay | Admitting: Cardiology

## 2017-02-04 ENCOUNTER — Ambulatory Visit (INDEPENDENT_AMBULATORY_CARE_PROVIDER_SITE_OTHER): Payer: PPO | Admitting: Psychology

## 2017-02-04 ENCOUNTER — Telehealth: Payer: Self-pay | Admitting: Physician Assistant

## 2017-02-04 DIAGNOSIS — G301 Alzheimer's disease with late onset: Secondary | ICD-10-CM | POA: Diagnosis not present

## 2017-02-04 DIAGNOSIS — F0280 Dementia in other diseases classified elsewhere without behavioral disturbance: Secondary | ICD-10-CM | POA: Diagnosis not present

## 2017-02-04 NOTE — Telephone Encounter (Signed)
Patient states she was seen by Dr. Halina Andreas at [facility] this morning.  She states was told she shouldn't be driving due to her condition.  Patient states she has to drive, that is the only way she can get where she needs to go.  She just doesn't understand why she can't drive.  She wants to know what Einar Pheasant thinks about all of this.

## 2017-02-04 NOTE — Telephone Encounter (Signed)
Routed to provider to advise

## 2017-02-05 ENCOUNTER — Encounter: Payer: Self-pay | Admitting: Physician Assistant

## 2017-02-05 ENCOUNTER — Ambulatory Visit (INDEPENDENT_AMBULATORY_CARE_PROVIDER_SITE_OTHER): Payer: PPO | Admitting: Physician Assistant

## 2017-02-05 ENCOUNTER — Other Ambulatory Visit: Payer: Self-pay | Admitting: Physician Assistant

## 2017-02-05 ENCOUNTER — Encounter: Payer: Self-pay | Admitting: Cardiology

## 2017-02-05 VITALS — BP 118/74 | HR 81 | Temp 98.3°F | Resp 16 | Ht 68.0 in | Wt 144.0 lb

## 2017-02-05 DIAGNOSIS — G40309 Generalized idiopathic epilepsy and epileptic syndromes, not intractable, without status epilepticus: Secondary | ICD-10-CM | POA: Diagnosis not present

## 2017-02-05 DIAGNOSIS — F0390 Unspecified dementia without behavioral disturbance: Secondary | ICD-10-CM | POA: Diagnosis not present

## 2017-02-05 NOTE — Patient Instructions (Signed)
I am sending you to Neurology for further discussion of childhood seizures and to see about weaning off of the Primidone. They will also discuss memory as well.   Please let me know if you would like me to have a counselor see you for anxiety.   Please keep up with your walks and healthy diet.  Keep up with mentally stimulating activities during the day.

## 2017-02-05 NOTE — Telephone Encounter (Signed)
MyChart message sent to patient to discuss. She is to schedule an appointment due to the complexity of the situation.

## 2017-02-05 NOTE — Progress Notes (Signed)
Patient presents to clinic today to discuss her recent assessment by Dr. Si Raider and to get a second opinion regarding recommendations given after her neuropsychological testing. Patient with noted memory deficits on prior visits and MyChart encounters. Originally assessed at visit in the office. MMSE with score of 28/30. Was then given Montreal Cognitive Assessment at next appointment, on which she performed poorly. Was sent to Dr. Si Raider for more comprehensive testing. Patient was diagnosed with a dementia, likely Alzheimer's on testing. It was recommended that she start a cholinesterase inhibitor and to receive road testing to determine safety with continued driving. Patient states she is very upset about her test results and that someone is trying to take her license away. States she does not feel she has an issue with memory. Does note occasional anxiety but denies depressed mood or anhedonia, although she states she does keep mostly to herself and is not engaged in any regular activities.  No family nearby and has a somewhat estranged relationship from her brother who lives in [place]. Denies SI/HI. Endorses good appetite and sleeping well at night.   Past Medical History:  Diagnosis Date  . Abnormal Pap smear   . Anemia   . Anxiety   . Aortic stenosis   . Bicuspid aortic valve   . Coronary artery disease   . Depression   . Gout   . Heart murmur   . Heart valve problem    will have follow up Echo-Dr Jordan-will have replacement in a couple of years  . Leukopenia    Seen by Dr. Lamonte Sakai, present since 2008, watchful waiting  . Osteopenia   . Seizures ([place])    as a child  . Shortness of breath dyspnea     Current Outpatient Prescriptions on File Prior to Visit  Medication Sig Dispense Refill  . Ascorbic Acid (VITAMIN C) 1000 MG tablet Take 1,000 mg by mouth daily.    Marland Kitchen aspirin 81 MG tablet Take 81 mg by mouth daily.    . Calcium Carbonate-Vitamin D (CALTRATE 600+D PO) Take 1 tablet by  mouth 2 (two) times daily.    . clindamycin (CLEOCIN) 300 MG capsule Take 600 mg ( 2 caps ) One hour before dental procedure 6 capsule 3  . Multiple Vitamins-Minerals (MULTIVITAMIN WOMEN) TABS Take 1 tablet by mouth daily.    . Omega-3 Fatty Acids (OMEGA-3 FISH OIL PO) Take 1 capsule by mouth 2 (two) times daily.     . pravastatin (PRAVACHOL) 80 MG tablet Take 1 tablet (80 mg total) by mouth daily. 30 tablet 3  . primidone (MYSOLINE) 250 MG tablet Take 1 tablet (250 mg total) by mouth 2 (two) times daily. 60 tablet 3   No current facility-administered medications on file prior to visit.     Allergies  Allergen Reactions  . Dextromethorphan Other (See Comments)    contraindication  . Pseudoephedrine Other (See Comments)    contriindication  . Penicillins Rash    Has patient had a PCN reaction causing immediate rash, facial/tongue/throat swelling, SOB or lightheadedness with hypotension: No Has patient had a PCN reaction causing severe rash involving mucus membranes or skin necrosis: No Has patient had a PCN reaction that required hospitalization No Has patient had a PCN reaction occurring within the last 10 years: No If all of the above answers are "NO", then may proceed with Cephalosporin use.    Family History  Problem Relation Age of Onset  . Multiple myeloma Mother   . Stroke Father   .  Hypertension Father   . Heart disease Father 35  . Kidney disease Father   . Cancer Father   . Cancer Maternal Grandmother        renal  . Osteoarthritis Brother     Social History   Social History  . Marital status: Single    Spouse name: N/A  . Number of children: N/A  . Years of education: N/A   Occupational History  . paralegal    Social History Main Topics  . Smoking status: Never Smoker  . Smokeless tobacco: Never Used  . Alcohol use 4.2 oz/week    7 Glasses of wine per week     Comment: 1 glass of wine/cranberry juice a day  . Drug use: No  . Sexual activity: No      Comment: TVH   Other Topics Concern  . None   Social History Narrative  . None   Review of Systems - See HPI.  All other ROS are negative.  BP 118/74   Pulse 81   Temp 98.3 F (36.8 C) (Oral)   Resp 16   Ht 5' 8"  (1.727 m)   Wt 144 lb (65.3 kg)   LMP 09/15/1987   SpO2 90%   BMI 21.90 kg/m   Physical Exam  Constitutional: She is oriented to person, place, and time and well-developed, well-nourished, and in no distress.  HENT:  Head: Normocephalic and atraumatic.  Eyes: Conjunctivae are normal.  Cardiovascular: Normal rate, regular rhythm, normal heart sounds and intact distal pulses.   Pulmonary/Chest: Effort normal and breath sounds normal. No respiratory distress. She has no wheezes. She has no rales. She exhibits no tenderness.  Neurological: She is alert and oriented to person, place, and time.  Skin: Skin is warm and dry. No rash noted.  Psychiatric: Affect normal.  Vitals reviewed.   Recent Results (from the past 2160 hour(s))  Hepatic function panel     Status: None   Collection Time: 11/09/16  8:04 AM  Result Value Ref Range   Total Bilirubin 0.3 0.2 - 1.2 mg/dL   Bilirubin, Direct 0.1 <=0.2 mg/dL   Indirect Bilirubin 0.2 0.2 - 1.2 mg/dL   Alkaline Phosphatase 110 33 - 130 U/L   AST 32 10 - 35 U/L   ALT 24 6 - 29 U/L   Total Protein 7.2 6.1 - 8.1 g/dL   Albumin 4.2 3.6 - 5.1 g/dL  Lipid panel     Status: None   Collection Time: 11/09/16  8:04 AM  Result Value Ref Range   Cholesterol 189 <200 mg/dL   Triglycerides 100 <150 mg/dL   HDL 76 >50 mg/dL   Total CHOL/HDL Ratio 2.5 <5.0 Ratio   VLDL 20 <30 mg/dL   LDL Cholesterol 93 <100 mg/dL  CBG monitoring, ED     Status: Abnormal   Collection Time: 12/19/16  2:32 PM  Result Value Ref Range   Glucose-Capillary 108 (H) 65 - 99 mg/dL  Basic metabolic panel     Status: Abnormal   Collection Time: 12/19/16  3:00 PM  Result Value Ref Range   Sodium 139 135 - 145 mmol/L   Potassium 3.7 3.5 - 5.1 mmol/L    Chloride 101 101 - 111 mmol/L   CO2 28 22 - 32 mmol/L   Glucose, Bld 122 (H) 65 - 99 mg/dL   BUN 17 6 - 20 mg/dL   Creatinine, Ser 0.73 0.44 - 1.00 mg/dL   Calcium 8.9 8.9 - 10.3 mg/dL  GFR calc non Af Amer >60 >60 mL/min   GFR calc Af Amer >60 >60 mL/min    Comment: (NOTE) The eGFR has been calculated using the CKD EPI equation. This calculation has not been validated in all clinical situations. eGFR's persistently <60 mL/min signify possible Chronic Kidney Disease.    Anion gap 10 5 - 15  CBC     Status: Abnormal   Collection Time: 12/19/16  3:00 PM  Result Value Ref Range   WBC 4.2 4.0 - 10.5 K/uL   RBC 4.03 3.87 - 5.11 MIL/uL   Hemoglobin 12.9 12.0 - 15.0 g/dL   HCT 38.4 36.0 - 46.0 %   MCV 95.3 78.0 - 100.0 fL   MCH 32.0 26.0 - 34.0 pg   MCHC 33.6 30.0 - 36.0 g/dL   RDW 12.6 11.5 - 15.5 %   Platelets 149 (L) 150 - 400 K/uL  Urinalysis, Routine w reflex microscopic     Status: Abnormal   Collection Time: 12/19/16  3:26 PM  Result Value Ref Range   Color, Urine YELLOW YELLOW   APPearance HAZY (A) CLEAR   Specific Gravity, Urine 1.015 1.005 - 1.030   pH 7.0 5.0 - 8.0   Glucose, UA NEGATIVE NEGATIVE mg/dL   Hgb urine dipstick NEGATIVE NEGATIVE   Bilirubin Urine NEGATIVE NEGATIVE   Ketones, ur NEGATIVE NEGATIVE mg/dL   Protein, ur NEGATIVE NEGATIVE mg/dL   Nitrite NEGATIVE NEGATIVE   Leukocytes, UA NEGATIVE NEGATIVE  Hepatic function panel     Status: Abnormal   Collection Time: 12/19/16  3:30 PM  Result Value Ref Range   Total Protein 7.0 6.5 - 8.1 g/dL   Albumin 3.4 (L) 3.5 - 5.0 g/dL   AST 38 15 - 41 U/L   ALT 25 14 - 54 U/L   Alkaline Phosphatase 114 38 - 126 U/L   Total Bilirubin 0.3 0.3 - 1.2 mg/dL   Bilirubin, Direct <0.1 (L) 0.1 - 0.5 mg/dL   Indirect Bilirubin NOT CALCULATED 0.3 - 0.9 mg/dL  Lipase, blood     Status: None   Collection Time: 12/19/16  3:30 PM  Result Value Ref Range   Lipase 13 11 - 51 U/L  TSH     Status: None   Collection Time:  12/19/16  3:30 PM  Result Value Ref Range   TSH 0.740 0.350 - 4.500 uIU/mL    Comment: Performed by a 3rd Generation assay with a functional sensitivity of <=0.01 uIU/mL.  I-Stat CG4 Lactic Acid, ED     Status: None   Collection Time: 12/19/16  7:27 PM  Result Value Ref Range   Lactic Acid, Venous 0.88 0.5 - 1.9 mmol/L  Comprehensive metabolic panel     Status: Abnormal   Collection Time: 01/05/17 10:57 AM  Result Value Ref Range   Sodium 133 (L) 135 - 145 mmol/L   Potassium 3.6 3.5 - 5.1 mmol/L   Chloride 99 (L) 101 - 111 mmol/L   CO2 24 22 - 32 mmol/L   Glucose, Bld 112 (H) 65 - 99 mg/dL   BUN 15 6 - 20 mg/dL   Creatinine, Ser 0.51 0.44 - 1.00 mg/dL   Calcium 8.6 (L) 8.9 - 10.3 mg/dL   Total Protein 6.8 6.5 - 8.1 g/dL   Albumin 3.5 3.5 - 5.0 g/dL   AST 34 15 - 41 U/L   ALT 22 14 - 54 U/L   Alkaline Phosphatase 110 38 - 126 U/L   Total Bilirubin 0.5 0.3 -  1.2 mg/dL   GFR calc non Af Amer >60 >60 mL/min   GFR calc Af Amer >60 >60 mL/min    Comment: (NOTE) The eGFR has been calculated using the CKD EPI equation. This calculation has not been validated in all clinical situations. eGFR's persistently <60 mL/min signify possible Chronic Kidney Disease.    Anion gap 10 5 - 15  Lipase, blood     Status: None   Collection Time: 01/05/17 10:57 AM  Result Value Ref Range   Lipase 21 11 - 51 U/L  I-Stat Troponin, ED (not at [facility])     Status: None   Collection Time: 01/05/17 11:19 AM  Result Value Ref Range   Troponin i, poc 0.00 0.00 - 0.08 ng/mL   Comment 3            Comment: Due to the release kinetics of cTnI, a negative result within the first hours of the onset of symptoms does not rule out myocardial infarction with certainty. If myocardial infarction is still suspected, repeat the test at appropriate intervals.   Urinalysis, Routine w reflex microscopic     Status: Abnormal   Collection Time: 01/05/17 12:50 PM  Result Value Ref Range   Color, Urine YELLOW YELLOW    APPearance CLOUDY (A) CLEAR   Specific Gravity, Urine 1.017 1.005 - 1.030   pH 7.0 5.0 - 8.0   Glucose, UA NEGATIVE NEGATIVE mg/dL   Hgb urine dipstick NEGATIVE NEGATIVE   Bilirubin Urine NEGATIVE NEGATIVE   Ketones, ur 5 (A) NEGATIVE mg/dL   Protein, ur NEGATIVE NEGATIVE mg/dL   Nitrite NEGATIVE NEGATIVE   Leukocytes, UA NEGATIVE NEGATIVE    Assessment/Plan: Dementia without behavioral disturbance Diagnosis given after extensive neurocognitive testing with Dr. Si Raider. Report in EMR. I do agree with this assessment that there is a likely Alzheimer's dementia present. I do also feel that there I a depression/anxiety component that may or may not be adding to this is a pseudodementia fashion, but there is no doubt in my mind of underlying pathology. Discussed starting medication but patient declines. Discussed counseling with her for her anxiety over driving. She will give some thought to this. Discussed mentally stimulating activities. Referral placed to Neurology for further discussion of treatment and to discuss with her this prior diagnosis of epilepsy. Her history is very confusing and I am not sure that she has needed the primidone that she has been on for decades. Will need to reach out to [facility] for road testing. Unsure if Dr. Si Raider has done this. Will consult with her. Reassurance given to patient that giving her scores on testing, the road test would be to make sure where are taking care of her safety and the safety of others. She voices understanding.   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 Referral to neurology placed.    Leeanne Rio, PA-C

## 2017-02-05 NOTE — Progress Notes (Signed)
Pre visit review using our clinic review tool, if applicable. No additional management support is needed unless otherwise documented below in the visit note. 

## 2017-02-06 DIAGNOSIS — F0390 Unspecified dementia without behavioral disturbance: Secondary | ICD-10-CM | POA: Insufficient documentation

## 2017-02-06 NOTE — Assessment & Plan Note (Signed)
Questionable. Referral to neurology placed.

## 2017-02-06 NOTE — Assessment & Plan Note (Signed)
Diagnosis given after extensive neurocognitive testing with Dr. Si Raider. Report in EMR. I do agree with this assessment that there is a likely Alzheimer's dementia present. I do also feel that there I a depression/anxiety component that may or may not be adding to this is a pseudodementia fashion, but there is no doubt in my mind of underlying pathology. Discussed starting medication but patient declines. Discussed counseling with her for her anxiety over driving. She will give some thought to this. Discussed mentally stimulating activities. Referral placed to Neurology for further discussion of treatment and to discuss with her this prior diagnosis of epilepsy. Her history is very confusing and I am not sure that she has needed the primidone that she has been on for decades. Will need to reach out to [facility] for road testing. Unsure if Dr. Si Raider has done this. Will consult with her. Reassurance given to patient that giving her scores on testing, the road test would be to make sure where are taking care of her safety and the safety of others. She voices understanding.

## 2017-02-08 ENCOUNTER — Encounter: Payer: Self-pay | Admitting: Physician Assistant

## 2017-02-09 ENCOUNTER — Encounter: Payer: Self-pay | Admitting: Physician Assistant

## 2017-02-09 ENCOUNTER — Encounter: Payer: Self-pay | Admitting: Neurology

## 2017-02-09 ENCOUNTER — Other Ambulatory Visit (INDEPENDENT_AMBULATORY_CARE_PROVIDER_SITE_OTHER): Payer: PPO

## 2017-02-09 DIAGNOSIS — R413 Other amnesia: Secondary | ICD-10-CM

## 2017-02-09 NOTE — Addendum Note (Signed)
Addended by: Katina Dung on: 02/09/2017 11:02 AM   Modules accepted: Orders

## 2017-02-10 ENCOUNTER — Encounter: Payer: Self-pay | Admitting: Obstetrics & Gynecology

## 2017-02-10 ENCOUNTER — Encounter: Payer: Self-pay | Admitting: Physician Assistant

## 2017-02-10 LAB — VITAMIN B12: Vitamin B-12: 780 pg/mL (ref 200–1100)

## 2017-02-10 NOTE — Telephone Encounter (Signed)
Please see patient message. She is requesting to see [facility] instead of [facility].  Can you help make this change?

## 2017-02-11 ENCOUNTER — Encounter: Payer: Self-pay | Admitting: Obstetrics & Gynecology

## 2017-02-15 ENCOUNTER — Encounter: Payer: Self-pay | Admitting: Cardiology

## 2017-02-15 ENCOUNTER — Encounter: Payer: Self-pay | Admitting: Physician Assistant

## 2017-02-16 ENCOUNTER — Encounter: Payer: Self-pay | Admitting: Neurology

## 2017-02-16 ENCOUNTER — Encounter: Payer: Self-pay | Admitting: Psychology

## 2017-02-17 ENCOUNTER — Telehealth: Payer: Self-pay | Admitting: Psychology

## 2017-02-17 NOTE — Telephone Encounter (Signed)
Patient dismissed from [facility] by Kandis Nab , effective February 16, 2017. Dismissal letter sent out by certified / registered mail.  daj

## 2017-02-19 ENCOUNTER — Encounter: Payer: Self-pay | Admitting: Neurology

## 2017-02-22 ENCOUNTER — Encounter: Payer: Self-pay | Admitting: Cardiology

## 2017-02-23 ENCOUNTER — Encounter: Payer: Self-pay | Admitting: Physician Assistant

## 2017-02-24 ENCOUNTER — Encounter: Payer: Self-pay | Admitting: Physician Assistant

## 2017-02-24 ENCOUNTER — Ambulatory Visit (INDEPENDENT_AMBULATORY_CARE_PROVIDER_SITE_OTHER): Payer: PPO | Admitting: Physician Assistant

## 2017-02-24 VITALS — BP 124/70 | HR 67 | Temp 99.3°F | Resp 14 | Ht 68.0 in | Wt 142.0 lb

## 2017-02-24 DIAGNOSIS — Z9189 Other specified personal risk factors, not elsewhere classified: Secondary | ICD-10-CM

## 2017-02-24 NOTE — Progress Notes (Signed)
Pre visit review using our clinic review tool, if applicable. No additional management support is needed unless otherwise documented below in the visit note. 

## 2017-02-24 NOTE — Patient Instructions (Signed)
I will finish paperwork tonight and fax in tomorrow.  I will send you a MyChart message when this has been done.   Follow-up with your specialists as scheduled.

## 2017-02-24 NOTE — Telephone Encounter (Signed)
Received signed domestic return receipt verifying delivery of certified letter on February 22, 2017. Article number [id number]

## 2017-02-24 NOTE — Progress Notes (Signed)
Patient presents to clinic today for musculoskeletal examination to help completion of DMV form. Driving safety assessment needed due to recent diagnosis of Alzheimer's and patient's remote history of seizures (questionable) during childhood.   Past Medical History:  Diagnosis Date  . Abnormal Pap smear   . Anemia   . Anxiety   . Aortic stenosis   . Bicuspid aortic valve   . Coronary artery disease   . Depression   . Gout   . Heart murmur   . Heart valve problem    will have follow up Echo-Dr Nicole Hess-will have replacement in a couple of years  . Leukopenia    Seen by Dr. Lamonte Hess, present since 2008, watchful waiting  . Osteopenia   . Seizures ([place])    as a child  . Shortness of breath dyspnea     Current Outpatient Prescriptions on File Prior to Visit  Medication Sig Dispense Refill  . Ascorbic Acid (VITAMIN C) 1000 MG tablet Take 1,000 mg by mouth daily.    Marland Kitchen aspirin 81 MG tablet Take 81 mg by mouth daily.    . Calcium Carbonate-Vitamin D (CALTRATE 600+D PO) Take 1 tablet by mouth 2 (two) times daily.    . clindamycin (CLEOCIN) 300 MG capsule Take 600 mg ( 2 caps ) One hour before dental procedure 6 capsule 3  . Multiple Vitamins-Minerals (MULTIVITAMIN WOMEN) TABS Take 1 tablet by mouth daily.    . Omega-3 Fatty Acids (OMEGA-3 FISH OIL PO) Take 1 capsule by mouth 2 (two) times daily.     . pravastatin (PRAVACHOL) 80 MG tablet Take 1 tablet (80 mg total) by mouth daily. 30 tablet 3  . primidone (MYSOLINE) 250 MG tablet TAKE 1 TABLET BY MOUTH TWICE DAILY 180 tablet 1   No current facility-administered medications on file prior to visit.     Allergies  Allergen Reactions  . Dextromethorphan Other (See Comments)    contraindication  . Pseudoephedrine Other (See Comments)    contriindication  . Penicillins Rash    Has patient had a PCN reaction causing immediate rash, facial/tongue/throat swelling, SOB or lightheadedness with hypotension: No Has patient had a PCN reaction  causing severe rash involving mucus membranes or skin necrosis: No Has patient had a PCN reaction that required hospitalization No Has patient had a PCN reaction occurring within the last 10 years: No If all of the above answers are "NO", then may proceed with Cephalosporin use.    Family History  Problem Relation Age of Onset  . Multiple myeloma Mother   . Stroke Father   . Hypertension Father   . Heart disease Father 60  . Kidney disease Father   . Cancer Father   . Cancer Maternal Grandmother        renal  . Osteoarthritis Brother     Social History   Social History  . Marital status: Single    Spouse name: N/A  . Number of children: N/A  . Years of education: N/A   Occupational History  . paralegal    Social History Main Topics  . Smoking status: Never Smoker  . Smokeless tobacco: Never Used  . Alcohol use 4.2 oz/week    7 Glasses of wine per week     Comment: 1 glass of wine/cranberry juice a day  . Drug use: No  . Sexual activity: No     Comment: TVH   Other Topics Concern  . None   Social History Narrative  . None  Review of Systems - See HPI.  All other ROS are negative.  BP 124/70   Pulse 67   Temp 99.3 F (37.4 C) (Oral)   Resp 14   Ht 5' 8"  (1.727 m)   Wt 142 lb (64.4 kg)   LMP 09/15/1987   SpO2 99%   BMI 21.59 kg/m   Physical Exam  Constitutional: She is oriented to person, place, and time and well-developed, well-nourished, and in no distress.  HENT:  Head: Normocephalic and atraumatic.  Eyes: Conjunctivae are normal.  Neck: Neck supple.  Cardiovascular: Normal rate, regular rhythm, normal heart sounds and intact distal pulses.   Pulmonary/Chest: Effort normal and breath sounds normal. No respiratory distress. She has no wheezes. She has no rales. She exhibits no tenderness.  Musculoskeletal:       Right shoulder: Normal.       Left shoulder: Normal.       Right wrist: Normal.       Left wrist: Normal.       Right hip: Normal.        Left hip: Normal.       Right knee: Normal.       Left knee: Normal.       Cervical back: Normal.       Right forearm: Normal.       Left forearm: Normal.  Lymphadenopathy:    She has no cervical adenopathy.  Neurological: She is alert and oriented to person, place, and time. She has normal sensation, normal strength and normal reflexes. Gait normal.  Vitals reviewed.  Recent Results (from the past 2160 hour(s))  CBG monitoring, ED     Status: Abnormal   Collection Time: 12/19/16  2:32 PM  Result Value Ref Range   Glucose-Capillary 108 (H) 65 - 99 mg/dL  Basic metabolic panel     Status: Abnormal   Collection Time: 12/19/16  3:00 PM  Result Value Ref Range   Sodium 139 135 - 145 mmol/L   Potassium 3.7 3.5 - 5.1 mmol/L   Chloride 101 101 - 111 mmol/L   CO2 28 22 - 32 mmol/L   Glucose, Bld 122 (H) 65 - 99 mg/dL   BUN 17 6 - 20 mg/dL   Creatinine, Ser 0.73 0.44 - 1.00 mg/dL   Calcium 8.9 8.9 - 10.3 mg/dL   GFR calc non Af Amer >60 >60 mL/min   GFR calc Af Amer >60 >60 mL/min    Comment: (NOTE) The eGFR has been calculated using the CKD EPI equation. This calculation has not been validated in all clinical situations. eGFR's persistently <60 mL/min signify possible Chronic Kidney Disease.    Anion gap 10 5 - 15  CBC     Status: Abnormal   Collection Time: 12/19/16  3:00 PM  Result Value Ref Range   WBC 4.2 4.0 - 10.5 K/uL   RBC 4.03 3.87 - 5.11 MIL/uL   Hemoglobin 12.9 12.0 - 15.0 g/dL   HCT 38.4 36.0 - 46.0 %   MCV 95.3 78.0 - 100.0 fL   MCH 32.0 26.0 - 34.0 pg   MCHC 33.6 30.0 - 36.0 g/dL   RDW 12.6 11.5 - 15.5 %   Platelets 149 (L) 150 - 400 K/uL  Urinalysis, Routine w reflex microscopic     Status: Abnormal   Collection Time: 12/19/16  3:26 PM  Result Value Ref Range   Color, Urine YELLOW YELLOW   APPearance HAZY (A) CLEAR   Specific Gravity, Urine 1.015 1.005 -  1.030   pH 7.0 5.0 - 8.0   Glucose, UA NEGATIVE NEGATIVE mg/dL   Hgb urine dipstick NEGATIVE  NEGATIVE   Bilirubin Urine NEGATIVE NEGATIVE   Ketones, ur NEGATIVE NEGATIVE mg/dL   Protein, ur NEGATIVE NEGATIVE mg/dL   Nitrite NEGATIVE NEGATIVE   Leukocytes, UA NEGATIVE NEGATIVE  Hepatic function panel     Status: Abnormal   Collection Time: 12/19/16  3:30 PM  Result Value Ref Range   Total Protein 7.0 6.5 - 8.1 g/dL   Albumin 3.4 (L) 3.5 - 5.0 g/dL   AST 38 15 - 41 U/L   ALT 25 14 - 54 U/L   Alkaline Phosphatase 114 38 - 126 U/L   Total Bilirubin 0.3 0.3 - 1.2 mg/dL   Bilirubin, Direct <0.1 (L) 0.1 - 0.5 mg/dL   Indirect Bilirubin NOT CALCULATED 0.3 - 0.9 mg/dL  Lipase, blood     Status: None   Collection Time: 12/19/16  3:30 PM  Result Value Ref Range   Lipase 13 11 - 51 U/L  TSH     Status: None   Collection Time: 12/19/16  3:30 PM  Result Value Ref Range   TSH 0.740 0.350 - 4.500 uIU/mL    Comment: Performed by a 3rd Generation assay with a functional sensitivity of <=0.01 uIU/mL.  I-Stat CG4 Lactic Acid, ED     Status: None   Collection Time: 12/19/16  7:27 PM  Result Value Ref Range   Lactic Acid, Venous 0.88 0.5 - 1.9 mmol/L  Comprehensive metabolic panel     Status: Abnormal   Collection Time: 01/05/17 10:57 AM  Result Value Ref Range   Sodium 133 (L) 135 - 145 mmol/L   Potassium 3.6 3.5 - 5.1 mmol/L   Chloride 99 (L) 101 - 111 mmol/L   CO2 24 22 - 32 mmol/L   Glucose, Bld 112 (H) 65 - 99 mg/dL   BUN 15 6 - 20 mg/dL   Creatinine, Ser 0.51 0.44 - 1.00 mg/dL   Calcium 8.6 (L) 8.9 - 10.3 mg/dL   Total Protein 6.8 6.5 - 8.1 g/dL   Albumin 3.5 3.5 - 5.0 g/dL   AST 34 15 - 41 U/L   ALT 22 14 - 54 U/L   Alkaline Phosphatase 110 38 - 126 U/L   Total Bilirubin 0.5 0.3 - 1.2 mg/dL   GFR calc non Af Amer >60 >60 mL/min   GFR calc Af Amer >60 >60 mL/min    Comment: (NOTE) The eGFR has been calculated using the CKD EPI equation. This calculation has not been validated in all clinical situations. eGFR's persistently <60 mL/min signify possible Chronic  Kidney Disease.    Anion gap 10 5 - 15  Lipase, blood     Status: None   Collection Time: 01/05/17 10:57 AM  Result Value Ref Range   Lipase 21 11 - 51 U/L  I-Stat Troponin, ED (not at [facility])     Status: None   Collection Time: 01/05/17 11:19 AM  Result Value Ref Range   Troponin i, poc 0.00 0.00 - 0.08 ng/mL   Comment 3            Comment: Due to the release kinetics of cTnI, a negative result within the first hours of the onset of symptoms does not rule out myocardial infarction with certainty. If myocardial infarction is still suspected, repeat the test at appropriate intervals.   Urinalysis, Routine w reflex microscopic     Status: Abnormal  Collection Time: 01/05/17 12:50 PM  Result Value Ref Range   Color, Urine YELLOW YELLOW   APPearance CLOUDY (A) CLEAR   Specific Gravity, Urine 1.017 1.005 - 1.030   pH 7.0 5.0 - 8.0   Glucose, UA NEGATIVE NEGATIVE mg/dL   Hgb urine dipstick NEGATIVE NEGATIVE   Bilirubin Urine NEGATIVE NEGATIVE   Ketones, ur 5 (A) NEGATIVE mg/dL   Protein, ur NEGATIVE NEGATIVE mg/dL   Nitrite NEGATIVE NEGATIVE   Leukocytes, UA NEGATIVE NEGATIVE  Vitamin B12     Status: None   Collection Time: 02/09/17 11:02 AM  Result Value Ref Range   Vitamin B-12 780 200 - 1,100 pg/mL    Assessment/Plan: 1. Driving safety issue Examination complete. Normal ROM and strength of neck, upper and lower extremities. Patient will need road testing due to diagnosis of mild alzheimer's. She has no history of wreck or difficulty with driving. Memory loss is mild at present. Neurocognitive testing revealed poor performance in testing related to driving skills however. Forms will be completed noting diagnoses and potential concerns, along with need for road testing and Neurology assessment.     Leeanne Rio, PA-C

## 2017-02-25 ENCOUNTER — Encounter: Payer: Self-pay | Admitting: Cardiology

## 2017-02-25 ENCOUNTER — Encounter: Payer: Self-pay | Admitting: Physician Assistant

## 2017-02-26 ENCOUNTER — Encounter: Payer: Self-pay | Admitting: Physician Assistant

## 2017-02-26 ENCOUNTER — Encounter: Payer: Self-pay | Admitting: Cardiology

## 2017-02-27 ENCOUNTER — Encounter: Payer: Self-pay | Admitting: Physician Assistant

## 2017-02-28 ENCOUNTER — Encounter: Payer: Self-pay | Admitting: Physician Assistant

## 2017-03-01 ENCOUNTER — Encounter: Payer: Self-pay | Admitting: Physician Assistant

## 2017-03-02 ENCOUNTER — Encounter: Payer: Self-pay | Admitting: Physician Assistant

## 2017-03-03 ENCOUNTER — Encounter: Payer: Self-pay | Admitting: Physician Assistant

## 2017-03-04 ENCOUNTER — Encounter: Payer: Self-pay | Admitting: Cardiology

## 2017-03-05 ENCOUNTER — Encounter: Payer: Self-pay | Admitting: Physician Assistant

## 2017-03-08 ENCOUNTER — Encounter: Payer: Self-pay | Admitting: Physician Assistant

## 2017-03-08 ENCOUNTER — Encounter: Payer: Self-pay | Admitting: Cardiology

## 2017-03-14 ENCOUNTER — Encounter: Payer: Self-pay | Admitting: Physician Assistant

## 2017-03-15 ENCOUNTER — Encounter: Payer: Self-pay | Admitting: Cardiology

## 2017-03-16 ENCOUNTER — Encounter: Payer: Self-pay | Admitting: Cardiology

## 2017-03-18 ENCOUNTER — Ambulatory Visit: Payer: PPO | Admitting: Neurology

## 2017-03-22 ENCOUNTER — Encounter: Payer: Self-pay | Admitting: Neurology

## 2017-03-22 ENCOUNTER — Ambulatory Visit: Payer: PPO | Admitting: Neurology

## 2017-04-01 ENCOUNTER — Encounter: Payer: Self-pay | Admitting: Physician Assistant

## 2017-04-01 ENCOUNTER — Encounter: Payer: Self-pay | Admitting: Cardiology

## 2017-04-01 ENCOUNTER — Encounter: Payer: Self-pay | Admitting: Neurology

## 2017-04-01 DIAGNOSIS — R42 Dizziness and giddiness: Secondary | ICD-10-CM | POA: Diagnosis not present

## 2017-04-01 DIAGNOSIS — R112 Nausea with vomiting, unspecified: Secondary | ICD-10-CM | POA: Diagnosis not present

## 2017-04-02 ENCOUNTER — Encounter: Payer: Self-pay | Admitting: Physician Assistant

## 2017-04-06 ENCOUNTER — Ambulatory Visit: Payer: PPO | Admitting: Neurology

## 2017-04-15 ENCOUNTER — Encounter: Payer: Self-pay | Admitting: Cardiology

## 2017-04-16 ENCOUNTER — Encounter: Payer: Self-pay | Admitting: Physician Assistant

## 2017-04-20 ENCOUNTER — Encounter: Payer: Self-pay | Admitting: Physician Assistant

## 2017-04-20 ENCOUNTER — Encounter: Payer: Self-pay | Admitting: Cardiology

## 2017-04-20 ENCOUNTER — Encounter: Payer: Self-pay | Admitting: Neurology

## 2017-04-20 ENCOUNTER — Telehealth: Payer: Self-pay | Admitting: Physician Assistant

## 2017-04-20 MED ORDER — MECLIZINE HCL 12.5 MG PO TABS: 12.5000 mg | ORAL_TABLET | Freq: Three times a day (TID) | ORAL | 0 refills | Status: DC | PRN

## 2017-04-20 NOTE — Telephone Encounter (Signed)
Pt would like to switch to Dr Martinique. Pt lives closer to our office and more convenient. Is that ok with you dr Martinique? OK with you Einar Pheasant?

## 2017-04-20 NOTE — Telephone Encounter (Signed)
Pt calling to check the status of request

## 2017-04-20 NOTE — Telephone Encounter (Signed)
Pt has been scheduled.  °

## 2017-04-20 NOTE — Telephone Encounter (Signed)
Ok with me 

## 2017-04-20 NOTE — Telephone Encounter (Signed)
Sxs are dizziness, and lightheadedness, same as the previous episode. Patient would like something to be called into the pharmacy. Patient did state that she has lvm with other providers. Please advise.

## 2017-04-21 ENCOUNTER — Encounter: Payer: Self-pay | Admitting: Physician Assistant

## 2017-04-21 NOTE — Telephone Encounter (Signed)
It is fine with me. Thanks, BJ 

## 2017-04-22 ENCOUNTER — Encounter: Payer: Self-pay | Admitting: Physician Assistant

## 2017-04-23 ENCOUNTER — Other Ambulatory Visit: Payer: Self-pay | Admitting: Otolaryngology

## 2017-04-23 DIAGNOSIS — R42 Dizziness and giddiness: Secondary | ICD-10-CM

## 2017-04-28 ENCOUNTER — Encounter: Payer: Self-pay | Admitting: *Deleted

## 2017-04-29 ENCOUNTER — Encounter: Payer: Self-pay | Admitting: Neurology

## 2017-04-29 ENCOUNTER — Ambulatory Visit (INDEPENDENT_AMBULATORY_CARE_PROVIDER_SITE_OTHER): Payer: PPO | Admitting: Neurology

## 2017-04-29 VITALS — BP 141/78 | HR 78 | Ht 68.0 in | Wt 138.0 lb

## 2017-04-29 DIAGNOSIS — E538 Deficiency of other specified B group vitamins: Secondary | ICD-10-CM

## 2017-04-29 DIAGNOSIS — R42 Dizziness and giddiness: Secondary | ICD-10-CM | POA: Diagnosis not present

## 2017-04-29 DIAGNOSIS — R413 Other amnesia: Secondary | ICD-10-CM | POA: Diagnosis not present

## 2017-04-29 HISTORY — DX: Dizziness and giddiness: R42

## 2017-04-29 HISTORY — DX: Other amnesia: R41.3

## 2017-04-29 MED ORDER — DONEPEZIL HCL 5 MG PO TABS: 5.0000 mg | ORAL_TABLET | Freq: Every day | ORAL | 1 refills | Status: DC

## 2017-04-29 NOTE — Progress Notes (Signed)
Reason for visit: Memory disturbance, vertigo  Referring physician: Dr. Reed [name] is a 71 y.o. female  History of present illness:  Nicole Hess is a 71 year old right-handed white female with a history of a memory disturbance that dates back about 2 years. This concurs around the time with her retirement from work. The patient worked as a Statistician. She has noted some mild short-term memory problems, she has some difficulty remembering recent events, she denies any word finding problems or problems remembering names for people. She does operate a motor vehicle, she denies any problems with safety or with directions. The patient is able to keep up with her medications and appointments and she is able to manage her own finances. She has undergone a neuropsychological evaluation that confirmed the presence of an organic memory issue. The patient is not on any medications for memory. Previously, an RPR and HIV blood work study was done and was unremarkable. She has been set up for MRI of the brain on 05/04/2017, this has not yet been done.  The patient also has had 2 episodes of vertigo, she is seeing Dr. Constance Holster for this. The patient had onset of vertigo around 12/27/2016. The patient woke up with the vertigo associated with some gait instability and nausea vomiting. The episode lasted 8 hours or so and then resolved. The patient had another more brief event that lasted about 30 minutes towards the end of July 2018. The patient has had some ringing in the right ear. She denies in black out episodes, numbness or weakness of the face, arms, or legs. She has not had any headache whatsoever. She was felt to have vestibulitis initially by Dr. Constance Holster.   Past Medical History:  Diagnosis Date  . Abnormal Pap smear   . Anemia   . Anxiety   . Aortic stenosis   . Bicuspid aortic valve   . Coronary artery disease   . Depression   . Gout   . Heart murmur   . Heart valve problem    will have follow up Echo-Dr Jordan-will have replacement in a couple of years  . Leukopenia    Seen by Dr. Lamonte Sakai, present since 2008, watchful waiting  . Osteopenia   . Seizures ([place])    as a child  . Shortness of breath dyspnea     Past Surgical History:  Procedure Laterality Date  . AORTIC VALVE REPLACEMENT N/A 02/26/2015   Procedure: AORTIC VALVE REPLACEMENT (AVR);  Surgeon: Gaye Pollack, MD;  Location: [facility];  Service: Open Heart Surgery;  Laterality: N/A;  . CARDIAC CATHETERIZATION N/A 01/29/2015   Procedure: Right/Left Heart Cath and Coronary Angiography;  Surgeon: Peter M Martinique, MD;  Location: [facility];  Service: Cardiovascular;  Laterality: N/A;  . CERVICAL CONE BIOPSY    . COLONOSCOPY  2010  . DOPPLER ECHOCARDIOGRAPHY  3/14  . GUM SURGERY    . PARTIAL HYSTERECTOMY    . TEE WITHOUT CARDIOVERSION N/A 02/26/2015   Procedure: TRANSESOPHAGEAL ECHOCARDIOGRAM (TEE);  Surgeon: Gaye Pollack, MD;  Location: [facility];  Service: Open Heart Surgery;  Laterality: N/A;  . TUBAL LIGATION    . VAGINAL HYSTERECTOMY     for dysplasia    Family History  Problem Relation Age of Onset  . Multiple myeloma Mother   . Stroke Father   . Hypertension Father   . Heart disease Father 54  . Kidney disease Father   . Cancer Father   .  Cancer Maternal Grandmother        renal  . Osteoarthritis Brother     Social history:  reports that she has never smoked. She has never used smokeless tobacco. She reports that she drinks about 4.2 oz of alcohol per week . She reports that she does not use drugs.  Medications:  Prior to Admission medications   Medication Sig Start Date End Date Taking? Authorizing Provider  Ascorbic Acid (VITAMIN C) 1000 MG tablet Take 1,000 mg by mouth daily.   Yes [provider]  aspirin 81 MG tablet Take 81 mg by mouth daily.   Yes [provider]  Calcium Carbonate-Vitamin D (CALTRATE 600+D PO) Take 1 tablet by mouth 2 (two) times daily.   Yes  [provider]  clindamycin (CLEOCIN) 300 MG capsule Take 600 mg ( 2 caps ) One hour before dental procedure 12/16/16  Yes Martinique, Peter M, MD  meclizine (ANTIVERT) 12.5 MG tablet Take 1 tablet (12.5 mg total) by mouth 3 (three) times daily as needed for dizziness. 04/20/17  Yes Brunetta Jeans, PA-C  Multiple Vitamins-Minerals (MULTIVITAMIN WOMEN) TABS Take 1 tablet by mouth daily. One a day/Women's   Yes [provider]  Omega-3 Fatty Acids (OMEGA-3 FISH OIL PO) Take 1 capsule by mouth 2 (two) times daily.    Yes [provider]  pravastatin (PRAVACHOL) 80 MG tablet Take 1 tablet (80 mg total) by mouth daily. 11/02/16  Yes Brunetta Jeans, PA-C  primidone (MYSOLINE) 250 MG tablet TAKE 1 TABLET BY MOUTH TWICE DAILY 02/11/17  Yes Brunetta Jeans, PA-C  Psyllium (METAMUCIL FIBER PO) Take 1 Dose by mouth daily.   Yes [provider]      Allergies  Allergen Reactions  . Dextromethorphan Other (See Comments)    contraindication  . Pseudoephedrine Other (See Comments)    contriindication  . Penicillins Rash    Has patient had a PCN reaction causing immediate rash, facial/tongue/throat swelling, SOB or lightheadedness with hypotension: No Has patient had a PCN reaction causing severe rash involving mucus membranes or skin necrosis: No Has patient had a PCN reaction that required hospitalization No Has patient had a PCN reaction occurring within the last 10 years: No If all of the above answers are "NO", then may proceed with Cephalosporin use.    ROS:  Out of a complete 14 system review of symptoms, the patient complains only of the following symptoms, and all other reviewed systems are negative.  Confusion, dizziness Ringing in the ears  Blood pressure (!) 141/78, pulse 78, height 5' 8"  (1.727 m), weight 138 lb (62.6 kg), last menstrual period 09/15/1987.  Physical Exam  General: The patient is alert and cooperative at the time of the  examination.  Eyes: Pupils are equal, round, and reactive to light. Discs are flat bilaterally.  Ears: Tympanic membranes are clear bilaterally.  Neck: The neck is supple, no carotid bruits are noted. There appears to be mild cardiac murmur radiations into both carotids.  Respiratory: The respiratory examination is clear.  Cardiovascular: The cardiovascular examination reveals a regular rate and rhythm, a valvular click was noted.  Skin: Extremities are without significant edema.  Neurologic Exam  Mental status: The patient is alert and oriented x 3 at the time of the examination. The patient has apparent normal recent and remote memory, with an apparently normal attention span and concentration ability. Mini-Mental Status Examination done today shows a total score 27/30.  Cranial nerves: Facial symmetry is present.  There is good sensation of the face to pinprick and soft touch bilaterally. The strength of the facial muscles and the muscles to head turning and shoulder shrug are normal bilaterally. Speech is well enunciated, no aphasia or dysarthria is noted. Extraocular movements are full. Visual fields are full. The tongue is midline, and the patient has symmetric elevation of the soft palate. No obvious hearing deficits are noted.  Motor: The motor testing reveals 5 over 5 strength of all 4 extremities. Good symmetric motor tone is noted throughout.  Sensory: Sensory testing is intact to pinprick, soft touch, vibration sensation, and position sense on all 4 extremities. No evidence of extinction is noted.  Coordination: Cerebellar testing reveals good finger-nose-finger and heel-to-shin bilaterally. The Nyan-Barrany procedure was done, this showed some mild horizontal nystagmus when looking to the left with the head turned to the right. The patient did report brief sensations of dizziness with this.  Gait and station: Gait is normal. Tandem gait is normal. Romberg is negative. No drift  is seen.  Reflexes: Deep tendon reflexes are symmetric and normal bilaterally. Toes are downgoing bilaterally.   Assessment/Plan:  1. Mild memory disturbance  2. Episodic vertigo, probable benign positional vertigo  The patient will be set up for MRI of the brain, this has been ordered by Dr. Constance Holster on 05/04/2017. The patient will be placed on Aricept taking 5 mg at night, she will call in one month if she is tolerating the drug and we will go up to 10 mg dosing at night. The patient have blood work today. She will follow-up in 6 months.  Jill Alexanders MD 04/29/2017 2:13 PM  [facility] [address] [place], [place] [address]  Phone [phone] Fax [phone]

## 2017-04-29 NOTE — Patient Instructions (Signed)
    We will start Aricept 5 mg at night for memory.  Begin Aricept (donepezil) at 5 mg at night for one month. If this medication is well-tolerated, please call our office and we will call in a prescription for the 10 mg tablets. Look out for side effects that may include nausea, diarrhea, weight loss, or stomach cramps. This medication will also cause a runny nose, therefore there is no need for allergy medications for this purpose.

## 2017-04-30 ENCOUNTER — Encounter: Payer: Self-pay | Admitting: Cardiology

## 2017-04-30 LAB — VITAMIN B12: VITAMIN B 12: 747 pg/mL (ref 232–1245)

## 2017-05-02 ENCOUNTER — Encounter: Payer: Self-pay | Admitting: Neurology

## 2017-05-02 ENCOUNTER — Encounter: Payer: Self-pay | Admitting: Cardiology

## 2017-05-02 ENCOUNTER — Encounter: Payer: Self-pay | Admitting: Obstetrics & Gynecology

## 2017-05-04 ENCOUNTER — Ambulatory Visit: Payer: PPO | Admitting: Family Medicine

## 2017-05-04 ENCOUNTER — Encounter: Payer: Self-pay | Admitting: Cardiology

## 2017-05-04 ENCOUNTER — Encounter: Payer: Self-pay | Admitting: Neurology

## 2017-05-04 ENCOUNTER — Encounter: Payer: Self-pay | Admitting: Physician Assistant

## 2017-05-04 ENCOUNTER — Ambulatory Visit
Admission: RE | Admit: 2017-05-04 | Discharge: 2017-05-04 | Disposition: A | Discharge status: Home / Self Care | Payer: PPO | Source: Ambulatory Visit | Attending: Otolaryngology | Admitting: Otolaryngology

## 2017-05-04 DIAGNOSIS — R413 Other amnesia: Secondary | ICD-10-CM | POA: Diagnosis not present

## 2017-05-04 DIAGNOSIS — R42 Dizziness and giddiness: Secondary | ICD-10-CM

## 2017-05-04 MED ORDER — GADOBENATE DIMEGLUMINE 529 MG/ML IV SOLN
13.0000 mL | Freq: Once | INTRAVENOUS | Status: AC | PRN
  Administered 2017-05-04: 13 mL via INTRAVENOUS

## 2017-05-05 MED ORDER — PRAVASTATIN SODIUM 80 MG PO TABS: 80.0000 mg | ORAL_TABLET | Freq: Every day | ORAL | 0 refills | Status: DC

## 2017-05-05 MED ORDER — PRIMIDONE 250 MG PO TABS: 250.0000 mg | ORAL_TABLET | Freq: Two times a day (BID) | ORAL | 1 refills | Status: DC

## 2017-05-07 ENCOUNTER — Encounter: Payer: Self-pay | Admitting: Cardiology

## 2017-05-11 ENCOUNTER — Ambulatory Visit: Payer: PPO | Admitting: Family Medicine

## 2017-05-13 ENCOUNTER — Other Ambulatory Visit: Payer: Self-pay | Admitting: Physician Assistant

## 2017-05-16 NOTE — Progress Notes (Signed)
Nicole Hess Date of Birth: 06-08-1946 Medical Record #[id number]  History of Present Illness: Nicole Hess is seen today for follow up s/p AVR. She  has a history of bicuspid aortic valve with aortic stenosis.She underwent AVR with a # 21 mm Edwards Magna-Ease bioprosthetic valve by Dr. Cyndia Bent on 02/26/15. She had an uneventful recovery.  On follow up today she is doing well. No  chest pain, SOB. She did have a couple of episodes of vertigo this spring. Evaluated by Dr. Constance Hess and given meclizine. She also had Neurocognitive testing with a Psychologist in May. Felt she had cognitive impairment and shouldn't drive. This really upset her. She was seen by Dr. Jannifer Hess more recently who felt she had mild dementia. MRI was OK. On aricept now.  She is walking daily when the weather is good. Really no  problems to report today.   Current Outpatient Prescriptions on File Prior to Visit  Medication Sig Dispense Refill  . Ascorbic Acid (VITAMIN C) 1000 MG tablet Take 1,000 mg by mouth daily.    Marland Kitchen aspirin 81 MG tablet Take 81 mg by mouth daily.    . Calcium Carbonate-Vitamin D (CALTRATE 600+D PO) Take 1 tablet by mouth 2 (two) times daily.    . clindamycin (CLEOCIN) 300 MG capsule Take 600 mg ( 2 caps ) One hour before dental procedure 6 capsule 3  . meclizine (ANTIVERT) 12.5 MG tablet Take 1 tablet (12.5 mg total) by mouth 3 (three) times daily as needed for dizziness. 15 tablet 0  . Multiple Vitamins-Minerals (MULTIVITAMIN WOMEN) TABS Take 1 tablet by mouth daily. One a day/Women's    . Omega-3 Fatty Acids (OMEGA-3 FISH OIL PO) Take 1 capsule by mouth 2 (two) times daily.     . pravastatin (PRAVACHOL) 80 MG tablet Take 1 tablet (80 mg total) by mouth daily. 30 tablet 0  . primidone (MYSOLINE) 250 MG tablet Take 1 tablet (250 mg total) by mouth 2 (two) times daily. 60 tablet 1  . Psyllium (METAMUCIL FIBER PO) Take 1 Dose by mouth daily.     No current facility-administered medications on file prior to visit.      Allergies  Allergen Reactions  . Dextromethorphan Other (See Comments)    contraindication  . Pseudoephedrine Other (See Comments)    contriindication  . Penicillins Rash    Has patient had a PCN reaction causing immediate rash, facial/tongue/throat swelling, SOB or lightheadedness with hypotension: No Has patient had a PCN reaction causing severe rash involving mucus membranes or skin necrosis: No Has patient had a PCN reaction that required hospitalization No Has patient had a PCN reaction occurring within the last 10 years: No If all of the above answers are "NO", then may proceed with Cephalosporin use.    Past Medical History:  Diagnosis Date  . Abnormal Pap smear   . Anemia   . Anxiety   . Aortic stenosis   . Bicuspid aortic valve   . Coronary artery disease   . Depression   . Gout   . Heart murmur   . Heart valve problem    will have follow up Echo-Dr Nicole Hess-will have replacement in a couple of years  . Leukopenia    Seen by Dr. Lamonte Hess, present since 2008, watchful waiting  . Memory difficulty 04/29/2017  . Osteopenia   . Seizures (Nicole Hess)    as a child  . Shortness of breath dyspnea   . Vertigo 04/29/2017    Past Surgical History:  Procedure  Laterality Date  . AORTIC VALVE REPLACEMENT N/A 02/26/2015   Procedure: AORTIC VALVE REPLACEMENT (AVR);  Surgeon: Gaye Pollack, MD;  Location: [facility];  Service: Open Heart Surgery;  Laterality: N/A;  . CARDIAC CATHETERIZATION N/A 01/29/2015   Procedure: Right/Left Heart Cath and Coronary Angiography;  Surgeon: Valena Ivanov M Martinique, MD;  Location: [facility];  Service: Cardiovascular;  Laterality: N/A;  . CERVICAL CONE BIOPSY    . COLONOSCOPY  2010  . DOPPLER ECHOCARDIOGRAPHY  3/14  . GUM SURGERY    . PARTIAL HYSTERECTOMY    . TEE WITHOUT CARDIOVERSION N/A 02/26/2015   Procedure: TRANSESOPHAGEAL ECHOCARDIOGRAM (TEE);  Surgeon: Gaye Pollack, MD;  Location: [facility];  Service: Open Heart Surgery;  Laterality: N/A;  . TUBAL  LIGATION    . VAGINAL HYSTERECTOMY     for dysplasia    History  Smoking Status  . Never Smoker  Smokeless Tobacco  . Never Used    History  Alcohol Use  . 4.2 oz/week  . 7 Glasses of wine per week    Comment: 1 glass of wine/cranberry juice a day    Family History  Problem Relation Age of Onset  . Multiple myeloma Mother   . Stroke Father   . Hypertension Father   . Heart disease Father 55  . Kidney disease Father   . Cancer Father   . Cancer Maternal Grandmother        renal  . Osteoarthritis Brother     Review of Systems: The review of systems is as noted in HPI.  All other systems were reviewed and are negative.  Physical Exam: BP 98/64 (BP Location: Left Arm, Patient Position: Sitting, Cuff Size: Normal)   Pulse 75   Ht 5' 8"  (1.727 m)   Wt 140 lb (63.5 kg)   LMP 09/15/1987   BMI 21.29 kg/m  She is a pleasant white female in no acute distress. HEENT: Normal Neck: No JVD, adenopathy, thyromegaly, or bruits. Lungs: Clear Cardiovascular: Regular rate and rhythm. Normal S1 and S2. Grade 1/6 systolic flow murmur heard best at right upper sternal border radiating to the carotids and apex. Abdomen: Soft and nontender. No masses or bruits. Bowel sounds positive. Extremities: No cyanosis or edema. Pulses are 2+ and symmetric. Skin: Warm and dry Neuro: Alert and oriented x3. Cranial nerves II through XII are intact. Is appropriate.  LABORATORY DATA: Lab Results  Component Value Date   WBC 4.2 12/19/2016   HGB 12.9 12/19/2016   HCT 38.4 12/19/2016   PLT 149 (L) 12/19/2016   GLUCOSE 112 (H) 01/05/2017   CHOL 189 11/09/2016   TRIG 100 11/09/2016   HDL 76 11/09/2016   LDLCALC 93 11/09/2016   ALT 22 01/05/2017   AST 34 01/05/2017   NA 133 (L) 01/05/2017   K 3.6 01/05/2017   CL 99 (L) 01/05/2017   CREATININE 0.51 01/05/2017   BUN 15 01/05/2017   CO2 24 01/05/2017   TSH 0.740 12/19/2016   INR 1.50 (H) 02/26/2015   HGBA1C 5.5 02/22/2015   Labs reviewed  dated 11/07/15: cholesterol 245, triglycerides 90, HDL 82, LDL 145.   Echo: 04/24/15 Study Conclusions  - Left ventricle: The cavity size was normal. Systolic function was normal. The estimated ejection fraction was in the range of 60% to 65%. Wall motion was normal; there were no regional wall motion abnormalities. - Aortic valve: A bioprosthesis was present and functioning normally. Peak velocity (S): 250 cm/s. Mean gradient (S): 14 mm  Hg. Valve area (VTI): 1.26 cm^2. Valve area (Vmax): 1.23 cm^2. Valve area (Vmean): 1.21 cm^2. - Mitral valve: Calcified annulus. Mildly thickened leaflets . There was mild regurgitation. - Right ventricle: The cavity size was mildly dilated. Wall thickness was normal.  Assessment / Plan: 1. Severe aortic stenosis with bicuspid aortic valve. S/p AVR in June 2016. She is doing very well.  Will continue ASA and pravastatin. Follow up Echo in 2016 was satisfactory. I will follow up in one year. Continue routine SBE prophylaxis.  2. Hypercholesterolemia. Well controlled on pravastatin.  3. Mild dementia.

## 2017-05-18 ENCOUNTER — Encounter: Payer: Self-pay | Admitting: Neurology

## 2017-05-19 ENCOUNTER — Encounter: Payer: Self-pay | Admitting: Cardiology

## 2017-05-19 ENCOUNTER — Ambulatory Visit (INDEPENDENT_AMBULATORY_CARE_PROVIDER_SITE_OTHER): Payer: PPO | Admitting: Cardiology

## 2017-05-19 VITALS — BP 98/64 | HR 75 | Ht 68.0 in | Wt 140.0 lb

## 2017-05-19 DIAGNOSIS — Q231 Congenital insufficiency of aortic valve: Secondary | ICD-10-CM | POA: Diagnosis not present

## 2017-05-19 DIAGNOSIS — I350 Nonrheumatic aortic (valve) stenosis: Secondary | ICD-10-CM

## 2017-05-19 DIAGNOSIS — Z952 Presence of prosthetic heart valve: Secondary | ICD-10-CM

## 2017-05-19 NOTE — Patient Instructions (Signed)
Continue your current therapy  I will see you in one year   

## 2017-05-21 ENCOUNTER — Other Ambulatory Visit: Payer: Self-pay | Admitting: Physician Assistant

## 2017-05-25 ENCOUNTER — Encounter: Payer: Self-pay | Admitting: Cardiology

## 2017-05-28 DIAGNOSIS — Z952 Presence of prosthetic heart valve: Secondary | ICD-10-CM | POA: Diagnosis not present

## 2017-05-28 DIAGNOSIS — E785 Hyperlipidemia, unspecified: Secondary | ICD-10-CM | POA: Diagnosis not present

## 2017-05-28 DIAGNOSIS — R569 Unspecified convulsions: Secondary | ICD-10-CM | POA: Diagnosis not present

## 2017-05-28 DIAGNOSIS — R413 Other amnesia: Secondary | ICD-10-CM | POA: Diagnosis not present

## 2017-05-28 DIAGNOSIS — R42 Dizziness and giddiness: Secondary | ICD-10-CM | POA: Diagnosis not present

## 2017-05-31 ENCOUNTER — Telehealth: Payer: Self-pay | Admitting: Neurology

## 2017-05-31 NOTE — Telephone Encounter (Signed)
I received a report of the MRI the brain done on 05/04/2017. This appeared to be relatively unremarkable with normal appearance of the temporal bones and posterior fossa. Minimal white matter changes were seen.   The patient is also provided a DMV form briefly filled out on 02/10/2017. This allows her to drive within 25 mile radius of the home with corrective lenses, daytime driving only.

## 2017-06-01 ENCOUNTER — Encounter: Payer: Self-pay | Admitting: Cardiology

## 2017-06-05 ENCOUNTER — Encounter: Payer: Self-pay | Admitting: Cardiology

## 2017-06-06 ENCOUNTER — Encounter: Payer: Self-pay | Admitting: Neurology

## 2017-06-06 ENCOUNTER — Encounter: Payer: Self-pay | Admitting: Cardiology

## 2017-06-10 ENCOUNTER — Encounter: Payer: Self-pay | Admitting: Cardiology

## 2017-06-13 ENCOUNTER — Encounter: Payer: Self-pay | Admitting: Cardiology

## 2017-06-14 ENCOUNTER — Encounter: Payer: Self-pay | Admitting: Cardiology

## 2017-06-20 ENCOUNTER — Encounter: Payer: Self-pay | Admitting: Cardiology

## 2017-06-23 ENCOUNTER — Encounter: Payer: Self-pay | Admitting: Neurology

## 2017-06-23 ENCOUNTER — Other Ambulatory Visit: Payer: Self-pay | Admitting: Neurology

## 2017-06-29 ENCOUNTER — Encounter: Payer: Self-pay | Admitting: Cardiology

## 2017-06-29 ENCOUNTER — Telehealth: Payer: Self-pay | Admitting: Cardiology

## 2017-06-29 NOTE — Telephone Encounter (Signed)
Pt notified to take Coricidin NOT any Nyquil

## 2017-06-29 NOTE — Telephone Encounter (Signed)
New message    Pt is calling asking for a call back. She wants to know if she can take nyquil severe cold and flu. Please call

## 2017-07-04 ENCOUNTER — Encounter: Payer: Self-pay | Admitting: Cardiology

## 2017-07-06 ENCOUNTER — Encounter (HOSPITAL_COMMUNITY): Payer: Self-pay

## 2017-07-06 ENCOUNTER — Emergency Department (HOSPITAL_COMMUNITY)
Admission: EM | Admit: 2017-07-06 | Discharge: 2017-07-06 | Disposition: A | Discharge status: Home / Self Care | Payer: PPO | Attending: Emergency Medicine | Admitting: Emergency Medicine

## 2017-07-06 DIAGNOSIS — F0390 Unspecified dementia without behavioral disturbance: Secondary | ICD-10-CM | POA: Insufficient documentation

## 2017-07-06 DIAGNOSIS — Z23 Encounter for immunization: Secondary | ICD-10-CM | POA: Insufficient documentation

## 2017-07-06 DIAGNOSIS — Z7982 Long term (current) use of aspirin: Secondary | ICD-10-CM | POA: Insufficient documentation

## 2017-07-06 DIAGNOSIS — Z79899 Other long term (current) drug therapy: Secondary | ICD-10-CM | POA: Insufficient documentation

## 2017-07-06 DIAGNOSIS — R2242 Localized swelling, mass and lump, left lower limb: Secondary | ICD-10-CM | POA: Diagnosis present

## 2017-07-06 DIAGNOSIS — L03116 Cellulitis of left lower limb: Secondary | ICD-10-CM | POA: Insufficient documentation

## 2017-07-06 DIAGNOSIS — Y999 Unspecified external cause status: Secondary | ICD-10-CM | POA: Insufficient documentation

## 2017-07-06 DIAGNOSIS — W5501XA Bitten by cat, initial encounter: Secondary | ICD-10-CM | POA: Diagnosis not present

## 2017-07-06 DIAGNOSIS — Z7902 Long term (current) use of antithrombotics/antiplatelets: Secondary | ICD-10-CM | POA: Insufficient documentation

## 2017-07-06 DIAGNOSIS — Y929 Unspecified place or not applicable: Secondary | ICD-10-CM | POA: Diagnosis not present

## 2017-07-06 DIAGNOSIS — I2510 Atherosclerotic heart disease of native coronary artery without angina pectoris: Secondary | ICD-10-CM | POA: Insufficient documentation

## 2017-07-06 DIAGNOSIS — Y939 Activity, unspecified: Secondary | ICD-10-CM | POA: Diagnosis not present

## 2017-07-06 LAB — I-STAT CG4 LACTIC ACID, ED: LACTIC ACID, VENOUS: 1.37 mmol/L (ref 0.5–1.9)

## 2017-07-06 LAB — CBC WITH DIFFERENTIAL/PLATELET
Basophils Absolute: 0 10*3/uL (ref 0.0–0.1)
Basophils Relative: 0 %
EOS PCT: 1 %
Eosinophils Absolute: 0.1 10*3/uL (ref 0.0–0.7)
HEMATOCRIT: 37.9 % (ref 36.0–46.0)
Hemoglobin: 12.5 g/dL (ref 12.0–15.0)
LYMPHS ABS: 1.4 10*3/uL (ref 0.7–4.0)
LYMPHS PCT: 32 %
MCH: 31.6 pg (ref 26.0–34.0)
MCHC: 33 g/dL (ref 30.0–36.0)
MCV: 95.7 fL (ref 78.0–100.0)
MONO ABS: 0.3 10*3/uL (ref 0.1–1.0)
MONOS PCT: 8 %
Neutro Abs: 2.5 10*3/uL (ref 1.7–7.7)
Neutrophils Relative %: 59 %
Platelets: 135 10*3/uL — ABNORMAL LOW (ref 150–400)
RBC: 3.96 MIL/uL (ref 3.87–5.11)
RDW: 13.3 % (ref 11.5–15.5)
WBC: 4.3 10*3/uL (ref 4.0–10.5)

## 2017-07-06 LAB — COMPREHENSIVE METABOLIC PANEL
ALBUMIN: 3.7 g/dL (ref 3.5–5.0)
ALT: 23 U/L (ref 14–54)
AST: 36 U/L (ref 15–41)
Alkaline Phosphatase: 117 U/L (ref 38–126)
Anion gap: 8 (ref 5–15)
BUN: 16 mg/dL (ref 6–20)
CHLORIDE: 104 mmol/L (ref 101–111)
CO2: 27 mmol/L (ref 22–32)
CREATININE: 0.78 mg/dL (ref 0.44–1.00)
Calcium: 9 mg/dL (ref 8.9–10.3)
GFR calc Af Amer: >60 mL/min (ref >60)
GFR calc non Af Amer: >60 mL/min (ref >60)
GLUCOSE: 95 mg/dL (ref 65–99)
POTASSIUM: 3.9 mmol/L (ref 3.5–5.1)
Sodium: 139 mmol/L (ref 135–145)
Total Bilirubin: 0.5 mg/dL (ref 0.3–1.2)
Total Protein: 6.9 g/dL (ref 6.5–8.1)

## 2017-07-06 MED ORDER — MECLIZINE HCL 25 MG PO TABS
12.5000 mg | ORAL_TABLET | Freq: Once | ORAL | Status: AC
  Administered 2017-07-06: 12.5 mg via ORAL
  Filled 2017-07-06: qty 1

## 2017-07-06 MED ORDER — METRONIDAZOLE 500 MG PO TABS: 500.0000 mg | ORAL_TABLET | Freq: Three times a day (TID) | ORAL | 0 refills | Status: AC

## 2017-07-06 MED ORDER — METRONIDAZOLE 500 MG PO TABS
500.0000 mg | ORAL_TABLET | Freq: Once | ORAL | Status: AC
  Administered 2017-07-06: 500 mg via ORAL
  Filled 2017-07-06: qty 1

## 2017-07-06 MED ORDER — DOXYCYCLINE HYCLATE 100 MG PO TABS
100.0000 mg | ORAL_TABLET | Freq: Once | ORAL | Status: AC
  Administered 2017-07-06: 100 mg via ORAL
  Filled 2017-07-06: qty 1

## 2017-07-06 MED ORDER — DOXYCYCLINE HYCLATE 100 MG PO CAPS: 100.0000 mg | ORAL_CAPSULE | Freq: Two times a day (BID) | ORAL | 0 refills | Status: AC

## 2017-07-06 MED ORDER — TETANUS-DIPHTH-ACELL PERTUSSIS 5-2.5-18.5 LF-MCG/0.5 IM SUSP
0.5000 mL | Freq: Once | INTRAMUSCULAR | Status: AC
  Administered 2017-07-06: 0.5 mL via INTRAMUSCULAR
  Filled 2017-07-06: qty 0.5

## 2017-07-06 NOTE — ED Notes (Signed)
Pt departed in NAD, refused use of wheelchair.  

## 2017-07-06 NOTE — ED Triage Notes (Signed)
Pt arrives from [facility] internal medicine at [place] after NP recommended she come here. Pt states she went there to be seen for cat scratch/bite to left knee but is unsure why they wanted her to come to ED. Scratch to left lower leg and knee appears to be scabbed over, reddened with minimal swelling.  Pt reports her cat is up to date on immunizations.

## 2017-07-06 NOTE — ED Notes (Signed)
Patient presents to desk in NAD sts she takes meclizine 12.5mg  TID for prevention of vertigo symptoms. Pt sts she does not have her meds with her and is due her medication and is concerned her symptoms will begin and cause significant distress. Verbal order given by Dr. Regenia Skeeter

## 2017-07-06 NOTE — Discharge Instructions (Signed)
A cat bite is a high risk wound for infection. Given your history of allergic reaction to penicillins, you have been prescribed Flagyl and doxycycline. You need to take both of these medications as prescribed. The wound redness should not expand past the marked lines after 2 or 3 days of antibiotics. Follow-up with your primary care doctor in 2-3 days for a wound check. Return to the ED for evaluation if you develop fevers, chills, worsening redness past marked line.

## 2017-07-06 NOTE — ED Provider Notes (Signed)
[facility]ER[facility] DEPARTMENT Provider Note   CSN: [id number] Arrival date & time: 07/06/17  1228     History   Chief Complaint Chief Complaint  Patient presents with  . Cellulitis    HPI Nicole Hess is a 71 y.o. female presents to the ED from urgent care for evaluation of cat bite to left lower leg sustained less than a week ago. Patient reports mild tenderness locally. Redness has slightly worsened, but pain is tolerable. No fevers, chills. Was evaluated by outside provider and told to come to ED. No other complaints today. She owns the cat who is healthy and UTD on all vaccines including rabies.   HPI  Past Medical History:  Diagnosis Date  . Abnormal Pap smear   . Anemia   . Anxiety   . Aortic stenosis   . Bicuspid aortic valve   . Coronary artery disease   . Depression   . Gout   . Heart murmur   . Heart valve problem    will have follow up Echo-Dr Jordan-will have replacement in a couple of years  . Leukopenia    Seen by Dr. Lamonte Sakai, present since 2008, watchful waiting  . Memory difficulty 04/29/2017  . Osteopenia   . Seizures ([place])    as a child  . Shortness of breath dyspnea   . Vertigo 04/29/2017    Patient Active Problem List   Diagnosis Date Noted  . Vertigo 04/29/2017  . Memory difficulty 04/29/2017  . Dementia without behavioral disturbance 02/06/2017  . 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 History of colonic polyps 11/09/2016  . S/P AVR (aortic valve replacement) 04/02/2015  . Aortic stenosis due to bicuspid aortic valve 02/26/2015  . Severe aortic stenosis 05/02/2014  . Precordial pain 01/26/2013  . Depression 03/30/2012  . Hyperlipidemia 03/30/2012    Past Surgical History:  Procedure Laterality Date  . AORTIC VALVE REPLACEMENT N/A 02/26/2015   Procedure: AORTIC VALVE REPLACEMENT (AVR);  Surgeon: Gaye Pollack, MD;  Location: [facility];  Service: Open Heart  Surgery;  Laterality: N/A;  . CARDIAC CATHETERIZATION N/A 01/29/2015   Procedure: Right/Left Heart Cath and Coronary Angiography;  Surgeon: Peter M Martinique, MD;  Location: [facility];  Service: Cardiovascular;  Laterality: N/A;  . CERVICAL CONE BIOPSY    . COLONOSCOPY  2010  . DOPPLER ECHOCARDIOGRAPHY  3/14  . GUM SURGERY    . PARTIAL HYSTERECTOMY    . TEE WITHOUT CARDIOVERSION N/A 02/26/2015   Procedure: TRANSESOPHAGEAL ECHOCARDIOGRAM (TEE);  Surgeon: Gaye Pollack, MD;  Location: [facility];  Service: Open Heart Surgery;  Laterality: N/A;  . TUBAL LIGATION    . VAGINAL HYSTERECTOMY     for dysplasia    OB History    Gravida Para Term Preterm AB Living   0 0 0 0 0 0   SAB TAB Ectopic Multiple Live Births   0 0 0 0         Home Medications    Prior to Admission medications   Medication Sig Start Date End Date Taking? Authorizing Provider  Ascorbic Acid (VITAMIN C) 1000 MG tablet Take 1,000 mg by mouth daily.    [provider]  aspirin 81 MG tablet Take 81 mg by mouth daily.    [provider]  Calcium Carbonate-Vitamin D (CALTRATE 600+D PO) Take 1 tablet by mouth 2 (two) times daily.    [provider]  clindamycin (  CLEOCIN) 300 MG capsule Take 600 mg ( 2 caps ) One hour before dental procedure 12/16/16   Martinique, Peter M, MD  donepezil (ARICEPT) 5 MG tablet TAKE ONE TABLET AT BEDTIME. 06/23/17   Kathrynn Ducking, MD  doxycycline (VIBRAMYCIN) 100 MG capsule Take 1 capsule (100 mg total) by mouth 2 (two) times daily. 07/06/17 07/13/17  Kinnie Feil, PA-C  meclizine (ANTIVERT) 12.5 MG tablet Take 1 tablet (12.5 mg total) by mouth 3 (three) times daily as needed for dizziness. 04/20/17   Brunetta Jeans, PA-C  metroNIDAZOLE (FLAGYL) 500 MG tablet Take 1 tablet (500 mg total) by mouth 3 (three) times daily. 07/06/17 07/13/17  Kinnie Feil, PA-C  Multiple Vitamins-Minerals (MULTIVITAMIN WOMEN) TABS Take 1 tablet by mouth daily. One a day/Women's     [provider]  Omega-3 Fatty Acids (OMEGA-3 FISH OIL PO) Take 1 capsule by mouth 2 (two) times daily.     [provider]  pravastatin (PRAVACHOL) 80 MG tablet Take 1 tablet (80 mg total) by mouth daily. 05/05/17   Brunetta Jeans, PA-C  primidone (MYSOLINE) 250 MG tablet Take 1 tablet (250 mg total) by mouth 2 (two) times daily. 05/05/17   Brunetta Jeans, PA-C  Psyllium (METAMUCIL FIBER PO) Take 1 Dose by mouth daily.    [provider]    Family History Family History  Problem Relation Age of Onset  . Multiple myeloma Mother   . Stroke Father   . Hypertension Father   . Heart disease Father 33  . Kidney disease Father   . Cancer Father   . Cancer Maternal Grandmother        renal  . Osteoarthritis Brother     Social History Social History  Substance Use Topics  . Smoking status: Never Smoker  . Smokeless tobacco: Never Used  . Alcohol use 4.2 oz/week    7 Glasses of wine per week     Comment: 1 glass of wine/cranberry juice a day     Allergies   Dextromethorphan; Pseudoephedrine; and Penicillins   Review of Systems Review of Systems  Constitutional: Negative for chills and fever.  Musculoskeletal: Negative for arthralgias and myalgias.  Skin: Positive for wound.     Physical Exam Updated Vital Signs BP 130/63 (BP Location: Left Arm)   Pulse 65   Temp 98.2 F (36.8 C) (Oral)   Resp 14   Ht 5' 8"  (1.727 m)   Wt 63.5 kg (140 lb)   LMP 09/15/1987   SpO2 100%   BMI 21.29 kg/m   Physical Exam  Constitutional: She is oriented to person, place, and time. She appears well-developed and well-nourished. No distress.  NAD.  HENT:  Head: Normocephalic and atraumatic.  Right Ear: External ear normal.  Left Ear: External ear normal.  Nose: Nose normal.  Eyes: Conjunctivae and EOM are normal. No scleral icterus.  Neck: Normal range of motion. Neck supple.  Cardiovascular: Normal rate, regular rhythm and normal heart sounds.   No  murmur heard. Pulmonary/Chest: Effort normal and breath sounds normal. She has no wheezes.  Musculoskeletal: Normal range of motion. She exhibits no deformity.  Full PROM of left knee and ankle w/o pain  Neurological: She is alert and oriented to person, place, and time.  Skin: Skin is warm and dry. Capillary refill takes less than 2 seconds. There is erythema.  4.5 x 5 cm area of erythema, mild edema and warmth to left medial tibia, mildly tender. Area has  3 pustular lesions in the center.   Psychiatric: She has a normal mood and affect. Her behavior is normal. Judgment and thought content normal.  Nursing note and vitals reviewed.    ED Treatments / Results  Labs (all labs ordered are listed, but only abnormal results are displayed) Labs Reviewed  CBC WITH DIFFERENTIAL/PLATELET - Abnormal; Notable for the following:       Result Value   Platelets 135 (*)    All other components within normal limits  COMPREHENSIVE METABOLIC PANEL  I-STAT CG4 LACTIC ACID, ED  I-STAT CG4 LACTIC ACID, ED    EKG  EKG Interpretation None       Radiology No results found.  Procedures Procedures (including critical care time)  Medications Ordered in ED Medications  meclizine (ANTIVERT) tablet 12.5 mg (12.5 mg Oral Given 07/06/17 1453)  Tdap (BOOSTRIX) injection 0.5 mL (0.5 mLs Intramuscular Given 07/06/17 1811)  doxycycline (VIBRA-TABS) tablet 100 mg (100 mg Oral Given 07/06/17 1811)  metroNIDAZOLE (FLAGYL) tablet 500 mg (500 mg Oral Given 07/06/17 1812)     Initial Impression / Assessment and Plan / ED Course  I have reviewed the triage vital signs and the nursing notes.  Pertinent labs & imaging results that were available during my care of the patient were reviewed by me and considered in my medical decision making (see chart for details).    71 year old female presents to ED for evaluation of cat bite to left leg. She owns the cat who is healthy and up-to-date on all vaccines  including rabies. Exam she has a small area of erythema, mild edema and tenderness with 3 pustular lesions consistent with mild cellulitis. This does not involve the nearby joints. She has no fevers or chills. Lab work or imaging not indicated today. We'll discharge with antibiotics, modified due to penicillin allergy. I spoke to pharmacist who assisted with antibiotic agents. Area was marked with pen. She is to follow-up with PCP in 2-3 days for wound check. Discussed signs and symptoms that would warrant return to the ED. Patient verbalized understanding and is agreeable with plan. Final Clinical Impressions(s) / ED Diagnoses   Final diagnoses:  Cat bite, initial encounter  Left leg cellulitis    New Prescriptions Discharge Medication List as of 07/06/2017  6:52 PM    START taking these medications   Details  doxycycline (VIBRAMYCIN) 100 MG capsule Take 1 capsule (100 mg total) by mouth 2 (two) times daily., Starting Tue 07/06/2017, Until Tue 07/13/2017, Print    metroNIDAZOLE (FLAGYL) 500 MG tablet Take 1 tablet (500 mg total) by mouth 3 (three) times daily., Starting Tue 07/06/2017, Until Tue 07/13/2017, Print         Kinnie Feil, PA-C 07/06/17 2135    Fredia Sorrow, MD 07/08/17 [phone]

## 2017-07-07 ENCOUNTER — Encounter: Payer: Self-pay | Admitting: Cardiology

## 2017-07-08 ENCOUNTER — Encounter: Payer: Self-pay | Admitting: Cardiology

## 2017-07-14 DIAGNOSIS — S81852S Open bite, left lower leg, sequela: Secondary | ICD-10-CM | POA: Diagnosis not present

## 2017-07-14 DIAGNOSIS — W5501XS Bitten by cat, sequela: Secondary | ICD-10-CM | POA: Diagnosis not present

## 2017-07-14 DIAGNOSIS — Z952 Presence of prosthetic heart valve: Secondary | ICD-10-CM | POA: Diagnosis not present

## 2017-07-30 ENCOUNTER — Encounter: Payer: Self-pay | Admitting: Neurology

## 2017-08-02 ENCOUNTER — Telehealth: Payer: Self-pay | Admitting: Neurology

## 2017-08-02 ENCOUNTER — Encounter: Payer: Self-pay | Admitting: Neurology

## 2017-08-02 NOTE — Telephone Encounter (Signed)
I called the patient.  The patient has passed the DMV road test, she does have a memory disorder, I have already indicated that she can drive within 25 mile radius of her home with corrective lenses.  The patient has brought by a DMV form, we will get this filled out.

## 2017-08-03 ENCOUNTER — Telehealth: Payer: Self-pay | Admitting: *Deleted

## 2017-08-03 ENCOUNTER — Encounter: Payer: Self-pay | Admitting: Neurology

## 2017-08-03 DIAGNOSIS — Z0289 Encounter for other administrative examinations: Secondary | ICD-10-CM

## 2017-08-03 NOTE — Telephone Encounter (Signed)
Pt DMV paper work on [employer]

## 2017-08-04 ENCOUNTER — Other Ambulatory Visit: Payer: Self-pay | Admitting: Neurology

## 2017-08-04 ENCOUNTER — Encounter: Payer: Self-pay | Admitting: Neurology

## 2017-08-04 MED ORDER — PRIMIDONE 250 MG PO TABS: 250.0000 mg | ORAL_TABLET | Freq: Two times a day (BID) | ORAL | 5 refills | Status: DC

## 2017-08-04 NOTE — Telephone Encounter (Signed)
Gave completed DMV paperwork back to medical records to process for patient.

## 2017-08-06 ENCOUNTER — Encounter: Payer: Self-pay | Admitting: Neurology

## 2017-08-12 DIAGNOSIS — H524 Presbyopia: Secondary | ICD-10-CM | POA: Diagnosis not present

## 2017-08-12 DIAGNOSIS — H35373 Puckering of macula, bilateral: Secondary | ICD-10-CM | POA: Diagnosis not present

## 2017-08-12 DIAGNOSIS — H18421 Band keratopathy, right eye: Secondary | ICD-10-CM | POA: Diagnosis not present

## 2017-08-12 DIAGNOSIS — H2513 Age-related nuclear cataract, bilateral: Secondary | ICD-10-CM | POA: Diagnosis not present

## 2017-08-12 DIAGNOSIS — S0502XA Injury of conjunctiva and corneal abrasion without foreign body, left eye, initial encounter: Secondary | ICD-10-CM | POA: Diagnosis not present

## 2017-08-12 DIAGNOSIS — H04123 Dry eye syndrome of bilateral lacrimal glands: Secondary | ICD-10-CM | POA: Diagnosis not present

## 2017-08-15 ENCOUNTER — Encounter: Payer: Self-pay | Admitting: Cardiology

## 2017-08-16 ENCOUNTER — Encounter: Payer: Self-pay | Admitting: Cardiology

## 2017-08-31 DIAGNOSIS — E785 Hyperlipidemia, unspecified: Secondary | ICD-10-CM | POA: Diagnosis not present

## 2017-08-31 DIAGNOSIS — Z952 Presence of prosthetic heart valve: Secondary | ICD-10-CM | POA: Diagnosis not present

## 2017-08-31 DIAGNOSIS — R413 Other amnesia: Secondary | ICD-10-CM | POA: Diagnosis not present

## 2017-08-31 DIAGNOSIS — Z0000 Encounter for general adult medical examination without abnormal findings: Secondary | ICD-10-CM | POA: Diagnosis not present

## 2017-08-31 DIAGNOSIS — R42 Dizziness and giddiness: Secondary | ICD-10-CM | POA: Diagnosis not present

## 2017-08-31 DIAGNOSIS — Z1389 Encounter for screening for other disorder: Secondary | ICD-10-CM | POA: Diagnosis not present

## 2017-09-06 ENCOUNTER — Encounter: Payer: Self-pay | Admitting: Cardiology

## 2017-09-16 ENCOUNTER — Encounter: Payer: Self-pay | Admitting: Cardiology

## 2017-09-17 ENCOUNTER — Encounter: Payer: Self-pay | Admitting: Cardiology

## 2017-09-22 ENCOUNTER — Telehealth: Payer: Self-pay | Admitting: Neurology

## 2017-09-22 NOTE — Telephone Encounter (Signed)
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  I am not sure where this information came from, the patient does not have a prior DWI or a history of cerebral palsy.  I discussed this with the patient.

## 2017-09-24 ENCOUNTER — Encounter: Payer: Self-pay | Admitting: Neurology

## 2017-09-24 ENCOUNTER — Other Ambulatory Visit: Payer: Self-pay | Admitting: Neurology

## 2017-09-24 MED ORDER — PRIMIDONE 250 MG PO TABS: 250.0000 mg | ORAL_TABLET | Freq: Two times a day (BID) | ORAL | 5 refills | Status: DC

## 2017-09-24 MED ORDER — DONEPEZIL HCL 5 MG PO TABS: 5.0000 mg | ORAL_TABLET | Freq: Every day | ORAL | 4 refills | Status: DC

## 2017-09-29 ENCOUNTER — Encounter: Payer: Self-pay | Admitting: Podiatry

## 2017-09-29 ENCOUNTER — Ambulatory Visit: Payer: PPO | Admitting: Podiatry

## 2017-09-29 DIAGNOSIS — M2042 Other hammer toe(s) (acquired), left foot: Secondary | ICD-10-CM | POA: Diagnosis not present

## 2017-09-29 DIAGNOSIS — B351 Tinea unguium: Secondary | ICD-10-CM | POA: Diagnosis not present

## 2017-09-29 NOTE — Progress Notes (Signed)
   Subjective:    Patient ID: Nicole Hess, female    DOB: 07-15-1946, 72 y.o.   MRN: [id number]  HPI    Review of Systems  All other systems reviewed and are negative.      Objective:   Physical Exam        Assessment & Plan:

## 2017-09-30 NOTE — Progress Notes (Signed)
Subjective:   Patient ID: Nicole Hess, female   DOB: 72 y.o.   MRN: [id number]   HPI Patient presents stating of had discoloration of my left big toenail and I am concerned I might have injured it.  Patient does not smoke and likes to be active   Review of Systems  All other systems reviewed and are negative.       Objective:  Physical Exam  Constitutional: She appears well-developed and well-nourished.  Cardiovascular: Intact distal pulses.  Pulmonary/Chest: Effort normal.  Musculoskeletal: Normal range of motion.  Neurological: She is alert.  Skin: Skin is warm.  Nursing note and vitals reviewed.   Neurovascular status intact muscle strength was adequate range of motion within normal limits with distal two thirds of the nail left being discolored and mild looseness of the nail bed with no drainage or proximal indications of pathology     Assessment:  Probable trauma to the left hallux nail with distal mycotic nail infection     Plan:  H&P condition reviewed and advised on topical medications and discussed oral medicines and also laser.  We will give this 4 months see how it grows out decide what else may be appropriate

## 2017-10-04 ENCOUNTER — Encounter: Payer: Self-pay | Admitting: Cardiology

## 2017-10-04 ENCOUNTER — Telehealth: Payer: Self-pay | Admitting: Neurology

## 2017-10-04 NOTE — Telephone Encounter (Signed)
The patient has been allowed to drive with restrictions, she has to drive during daylight hours only within a 35 mile radius from her home.  She must use corrective lenses.

## 2017-10-11 ENCOUNTER — Encounter: Payer: Self-pay | Admitting: Cardiology

## 2017-10-11 ENCOUNTER — Encounter: Payer: Self-pay | Admitting: Obstetrics & Gynecology

## 2017-10-12 ENCOUNTER — Encounter: Payer: Self-pay | Admitting: Cardiology

## 2017-10-14 ENCOUNTER — Encounter: Payer: Self-pay | Admitting: Neurology

## 2017-10-14 ENCOUNTER — Encounter: Payer: Self-pay | Admitting: Cardiology

## 2017-10-14 ENCOUNTER — Telehealth: Payer: Self-pay

## 2017-10-14 NOTE — Telephone Encounter (Signed)
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

## 2017-10-15 ENCOUNTER — Encounter: Payer: Self-pay | Admitting: Cardiology

## 2017-10-17 ENCOUNTER — Encounter: Payer: Self-pay | Admitting: Neurology

## 2017-10-21 ENCOUNTER — Telehealth: Payer: Self-pay | Admitting: Cardiology

## 2017-10-21 NOTE — Telephone Encounter (Signed)
Did not need this encounter °

## 2017-10-22 ENCOUNTER — Encounter: Payer: Self-pay | Admitting: Obstetrics & Gynecology

## 2017-10-22 ENCOUNTER — Encounter: Payer: Self-pay | Admitting: Cardiology

## 2017-10-22 ENCOUNTER — Encounter: Payer: Self-pay | Admitting: Neurology

## 2017-10-23 ENCOUNTER — Encounter: Payer: Self-pay | Admitting: Neurology

## 2017-10-23 ENCOUNTER — Encounter: Payer: Self-pay | Admitting: Podiatry

## 2017-10-23 ENCOUNTER — Encounter: Payer: Self-pay | Admitting: Obstetrics & Gynecology

## 2017-10-24 ENCOUNTER — Encounter: Payer: Self-pay | Admitting: Obstetrics & Gynecology

## 2017-10-24 ENCOUNTER — Encounter: Payer: Self-pay | Admitting: Cardiology

## 2017-10-27 ENCOUNTER — Encounter: Payer: Self-pay | Admitting: Cardiology

## 2017-10-28 ENCOUNTER — Encounter: Payer: Self-pay | Admitting: Neurology

## 2017-10-28 ENCOUNTER — Encounter: Payer: Self-pay | Admitting: Cardiology

## 2017-11-02 ENCOUNTER — Encounter: Payer: Self-pay | Admitting: Neurology

## 2017-11-02 ENCOUNTER — Telehealth: Payer: Self-pay | Admitting: Cardiology

## 2017-11-02 ENCOUNTER — Ambulatory Visit (INDEPENDENT_AMBULATORY_CARE_PROVIDER_SITE_OTHER): Payer: PPO | Admitting: Neurology

## 2017-11-02 VITALS — BP 113/67 | HR 63 | Ht 68.0 in | Wt 145.5 lb

## 2017-11-02 DIAGNOSIS — Z5181 Encounter for therapeutic drug level monitoring: Secondary | ICD-10-CM

## 2017-11-02 DIAGNOSIS — R413 Other amnesia: Secondary | ICD-10-CM

## 2017-11-02 NOTE — Progress Notes (Signed)
Reason for visit: Memory disorder  Nicole Hess is an 72 y.o. female  History of present illness:  Nicole Hess is a 72 year old right-handed white female with a history of a mild memory disturbance.  The patient has been on Aricept, she is tolerating the medication well.  She has a long-standing history of seizures as a child, she remains on Mysoline, she has not had any seizures in many years.  The patient is operating a motor vehicle, she is doing well with this.  She reports no issues with directions.  The patient sleeps well at night.  She denies any significant problems with balance.  She returns to the office today for an evaluation.  She has undergone a recent MRI of the brain that was unremarkable.  Past Medical History:  Diagnosis Date  . Abnormal Pap smear   . Anemia   . Anxiety   . Aortic stenosis   . Bicuspid aortic valve   . Coronary artery disease   . Depression   . Gout   . Heart murmur   . Heart valve problem    will have follow up Echo-Dr Jordan-will have replacement in a couple of years  . Leukopenia    Seen by Dr. Lamonte Sakai, present since 2008, watchful waiting  . Memory difficulty 04/29/2017  . Osteopenia   . Seizures ([place])    as a child  . Shortness of breath dyspnea   . Vertigo 04/29/2017    Past Surgical History:  Procedure Laterality Date  . AORTIC VALVE REPLACEMENT N/A 02/26/2015   Procedure: AORTIC VALVE REPLACEMENT (AVR);  Surgeon: Gaye Pollack, MD;  Location: [facility];  Service: Open Heart Surgery;  Laterality: N/A;  . CARDIAC CATHETERIZATION N/A 01/29/2015   Procedure: Right/Left Heart Cath and Coronary Angiography;  Surgeon: Peter M Martinique, MD;  Location: [facility];  Service: Cardiovascular;  Laterality: N/A;  . CERVICAL CONE BIOPSY    . COLONOSCOPY  2010  . DOPPLER ECHOCARDIOGRAPHY  3/14  . GUM SURGERY    . PARTIAL HYSTERECTOMY    . TEE WITHOUT CARDIOVERSION N/A 02/26/2015   Procedure: TRANSESOPHAGEAL ECHOCARDIOGRAM (TEE);  Surgeon:  Gaye Pollack, MD;  Location: [facility];  Service: Open Heart Surgery;  Laterality: N/A;  . TUBAL LIGATION    . VAGINAL HYSTERECTOMY     for dysplasia    Family History  Problem Relation Age of Onset  . Multiple myeloma Mother   . Stroke Father   . Hypertension Father   . Heart disease Father 81  . Kidney disease Father   . Cancer Father   . Cancer Maternal Grandmother        renal  . Osteoarthritis Brother     Social history:  reports that  has never smoked. she has never used smokeless tobacco. She reports that she drinks about 4.2 oz of alcohol per week. She reports that she does not use drugs.    Allergies  Allergen Reactions  . Dextromethorphan Other (See Comments)    contraindication  . Pseudoephedrine Other (See Comments)    contriindication  . Penicillins Rash    Has patient had a PCN reaction causing immediate rash, facial/tongue/throat swelling, SOB or lightheadedness with hypotension: No Has patient had a PCN reaction causing severe rash involving mucus membranes or skin necrosis: No Has patient had a PCN reaction that required hospitalization No Has patient had a PCN reaction occurring within the last 10 years: No If all of the above answers  are "NO", then may proceed with Cephalosporin use.    Medications:  Prior to Admission medications   Medication Sig Start Date End Date Taking? Authorizing Provider  Ascorbic Acid (VITAMIN C) 1000 MG tablet Take 1,000 mg by mouth daily.   Yes [provider]  aspirin 81 MG tablet Take 81 mg by mouth daily.   Yes [provider]  Calcium Carbonate-Vitamin D (CALTRATE 600+D PO) Take 1 tablet by mouth 2 (two) times daily.   Yes [provider]  clindamycin (CLEOCIN) 300 MG capsule Take 600 mg ( 2 caps ) One hour before dental procedure 12/16/16  Yes Martinique, Peter M, MD  donepezil (ARICEPT) 5 MG tablet Take 1 tablet (5 mg total) by mouth at bedtime. 09/24/17  Yes Kathrynn Ducking, MD  meclizine (ANTIVERT)  12.5 MG tablet Take 1 tablet (12.5 mg total) by mouth 3 (three) times daily as needed for dizziness. 04/20/17  Yes Brunetta Jeans, PA-C  Multiple Vitamins-Minerals (MULTIVITAMIN WOMEN) TABS Take 1 tablet by mouth daily. One a day/Women's   Yes [provider]  Omega-3 Fatty Acids (OMEGA-3 FISH OIL PO) Take 1 capsule by mouth 2 (two) times daily.    Yes [provider]  pravastatin (PRAVACHOL) 80 MG tablet Take 1 tablet (80 mg total) by mouth daily. 05/05/17  Yes Brunetta Jeans, PA-C  primidone (MYSOLINE) 250 MG tablet Take 1 tablet (250 mg total) by mouth 2 (two) times daily. 09/24/17  Yes Kathrynn Ducking, MD  Psyllium (METAMUCIL FIBER PO) Take 1 Dose by mouth daily.   Yes [provider]    ROS:  Out of a complete 14 system review of symptoms, the patient complains only of the following symptoms, and all other reviewed systems are negative.  Dizziness Ringing in the ears Eye redness Cough Bruising easily Agitation  Blood pressure 113/67, pulse 63, height 5' 8"  (1.727 m), weight 145 lb 8 oz (66 kg), last menstrual period 09/15/1987.  Physical Exam  General: The patient is alert and cooperative at the time of the examination.  Skin: No significant peripheral edema is noted.   Neurologic Exam  Mental status: The patient is alert and oriented x 3 at the time of the examination. The Mini-Mental status examination done today shows a total score of 24/30.   Cranial nerves: Facial symmetry is present. Speech is normal, no aphasia or dysarthria is noted. Extraocular movements are full. Visual fields are full.  Motor: The patient has good strength in all 4 extremities.  Sensory examination: Soft touch sensation is symmetric on the face, arms, and legs.  Coordination: The patient has good finger-nose-finger and heel-to-shin bilaterally.  Gait and station: The patient has a normal gait. Tandem gait is slightly unsteady. Romberg is negative. No drift is  seen.  Reflexes: Deep tendon reflexes are symmetric.   Assessment/Plan:  1.  Mild memory disturbance  2.  History of seizures  The patient will continue the Mysoline and Aricept for now, blood work will be done today.  She will follow-up in 6 months.  The patient denies any changes in memory since last seen.  Jill Alexanders MD 11/02/2017 10:56 AM  [facility] [address] [place], [place] [address]  Phone [phone] Fax [phone]

## 2017-11-02 NOTE — Telephone Encounter (Signed)
Nicole Hess is asking if you can give her a call please . Thank you

## 2017-11-02 NOTE — Telephone Encounter (Signed)
Returned call to patient.She stated she is having I Pad problems and she wanted to make sure I did not message her.Advised I have not sent her any messages.Stated she is doing good.

## 2017-11-03 ENCOUNTER — Telehealth: Payer: Self-pay | Admitting: *Deleted

## 2017-11-03 LAB — CBC WITH DIFFERENTIAL/PLATELET
BASOS: 1 %
Basophils Absolute: 0 10*3/uL (ref 0.0–0.2)
EOS (ABSOLUTE): 0.1 10*3/uL (ref 0.0–0.4)
Eos: 2 %
Hematocrit: 38.8 % (ref 34.0–46.6)
Hemoglobin: 12.8 g/dL (ref 11.1–15.9)
Immature Grans (Abs): 0 10*3/uL (ref 0.0–0.1)
Immature Granulocytes: 0 %
Lymphocytes Absolute: 1.4 10*3/uL (ref 0.7–3.1)
Lymphs: 39 %
MCH: 31.4 pg (ref 26.6–33.0)
MCHC: 33 g/dL (ref 31.5–35.7)
MCV: 95 fL (ref 79–97)
MONOS ABS: 0.3 10*3/uL (ref 0.1–0.9)
Monocytes: 10 %
NEUTROS ABS: 1.7 10*3/uL (ref 1.4–7.0)
NEUTROS PCT: 48 %
PLATELETS: 162 10*3/uL (ref 150–379)
RBC: 4.08 x10E6/uL (ref 3.77–5.28)
RDW: 13.3 % (ref 12.3–15.4)
WBC: 3.5 10*3/uL (ref 3.4–10.8)

## 2017-11-03 LAB — PRIMIDONE, SERUM
PRIMIDONE LVL: 15.8 ug/mL — AB (ref 5.0–12.0)
Phenobarbital, Serum: 37 ug/mL (ref 15–40)

## 2017-11-03 NOTE — Telephone Encounter (Signed)
-----   Message from Kathrynn Ducking, MD sent at 11/03/2017  1:49 PM EST ----- Blood work is unremarkable with a normal CBC, phenobarbital is in the therapeutic range, Primidone level is slightly high, the patient is tolerating well, no change in dosing.  Please call the patient. ----- Message ----- From: Lavone Neri Lab Results In Sent: 11/03/2017   7:41 AM To: Kathrynn Ducking, MD

## 2017-11-03 NOTE — Telephone Encounter (Signed)
Called and spoke with patient about lab results per CW,MD note. Pt verbalized understanding and appreciation for call.

## 2017-11-11 ENCOUNTER — Telehealth: Payer: Self-pay | Admitting: Cardiology

## 2017-11-11 DIAGNOSIS — S51851A Open bite of right forearm, initial encounter: Secondary | ICD-10-CM | POA: Diagnosis not present

## 2017-11-11 DIAGNOSIS — W5501XA Bitten by cat, initial encounter: Secondary | ICD-10-CM | POA: Diagnosis not present

## 2017-11-11 NOTE — Telephone Encounter (Signed)
Patient calling states that she needs Dr. Doug Sou approval for medication Doxycyline. This medication is for an infection for kitten that tried bite her ankle.

## 2017-11-11 NOTE — Telephone Encounter (Signed)
New message ° °Pt verbalized that she is calling for the RN °

## 2017-11-11 NOTE — Telephone Encounter (Addendum)
Pt called in as she saw her PCP as her cat scratched her and she got new medications sent to the pharmacy. She wasn't sure what the new medication was and wants to know if Dr. Martinique will review and tell her if okay to take. Told her that the PharmD will review all of her medications with her when she picks it up. She was excited to know that the Pharm D could help her.

## 2017-11-11 NOTE — Telephone Encounter (Signed)
Pt made aware okay for her to take the prescribed medication from her PCP. Pt verbalized understanding.

## 2017-11-13 ENCOUNTER — Other Ambulatory Visit: Payer: Self-pay | Admitting: Neurology

## 2017-12-06 DIAGNOSIS — S51801A Unspecified open wound of right forearm, initial encounter: Secondary | ICD-10-CM | POA: Diagnosis not present

## 2017-12-06 DIAGNOSIS — W5501XA Bitten by cat, initial encounter: Secondary | ICD-10-CM | POA: Diagnosis not present

## 2017-12-29 DIAGNOSIS — G40309 Generalized idiopathic epilepsy and epileptic syndromes, not intractable, without status epilepticus: Secondary | ICD-10-CM | POA: Diagnosis not present

## 2017-12-29 DIAGNOSIS — R413 Other amnesia: Secondary | ICD-10-CM | POA: Diagnosis not present

## 2017-12-29 DIAGNOSIS — F0280 Dementia in other diseases classified elsewhere without behavioral disturbance: Secondary | ICD-10-CM | POA: Diagnosis not present

## 2017-12-29 DIAGNOSIS — G309 Alzheimer's disease, unspecified: Secondary | ICD-10-CM | POA: Diagnosis not present

## 2018-01-19 ENCOUNTER — Emergency Department (HOSPITAL_COMMUNITY)
Admission: EM | Admit: 2018-01-19 | Discharge: 2018-01-19 | Disposition: A | Discharge status: Home / Self Care | Payer: PPO | Attending: Emergency Medicine | Admitting: Emergency Medicine

## 2018-01-19 ENCOUNTER — Other Ambulatory Visit: Payer: Self-pay

## 2018-01-19 ENCOUNTER — Ambulatory Visit (HOSPITAL_COMMUNITY)
Admission: EM | Admit: 2018-01-19 | Discharge: 2018-01-19 | Disposition: A | Discharge status: Other Acute Inpatient Hospital | Payer: PPO

## 2018-01-19 ENCOUNTER — Encounter (HOSPITAL_COMMUNITY): Payer: Self-pay | Admitting: Emergency Medicine

## 2018-01-19 DIAGNOSIS — Y999 Unspecified external cause status: Secondary | ICD-10-CM | POA: Insufficient documentation

## 2018-01-19 DIAGNOSIS — Z79899 Other long term (current) drug therapy: Secondary | ICD-10-CM | POA: Insufficient documentation

## 2018-01-19 DIAGNOSIS — W5503XA Scratched by cat, initial encounter: Secondary | ICD-10-CM | POA: Diagnosis not present

## 2018-01-19 DIAGNOSIS — Y939 Activity, unspecified: Secondary | ICD-10-CM | POA: Insufficient documentation

## 2018-01-19 DIAGNOSIS — I2510 Atherosclerotic heart disease of native coronary artery without angina pectoris: Secondary | ICD-10-CM | POA: Insufficient documentation

## 2018-01-19 DIAGNOSIS — S70312A Abrasion, left thigh, initial encounter: Secondary | ICD-10-CM | POA: Diagnosis not present

## 2018-01-19 DIAGNOSIS — Y929 Unspecified place or not applicable: Secondary | ICD-10-CM | POA: Insufficient documentation

## 2018-01-19 DIAGNOSIS — S80812A Abrasion, left lower leg, initial encounter: Secondary | ICD-10-CM | POA: Insufficient documentation

## 2018-01-19 DIAGNOSIS — F0390 Unspecified dementia without behavioral disturbance: Secondary | ICD-10-CM | POA: Diagnosis not present

## 2018-01-19 DIAGNOSIS — Z7982 Long term (current) use of aspirin: Secondary | ICD-10-CM | POA: Insufficient documentation

## 2018-01-19 MED ORDER — DOXYCYCLINE HYCLATE 100 MG PO CAPS: 100.0000 mg | ORAL_CAPSULE | Freq: Two times a day (BID) | ORAL | 0 refills | Status: DC

## 2018-01-19 NOTE — ED Triage Notes (Signed)
Pt reports her cat bit and scratched her. She reports that the cat has not been vaccinated. Small abrasions noted to the left and right thigh no streaking.

## 2018-01-19 NOTE — ED Provider Notes (Signed)
[facility] EMERGENCY DEPARTMENT Provider Note   CSN: [id number] Arrival date & time: 01/19/18  1012     History   Chief Complaint Chief Complaint  Patient presents with  . Animal Bite    HPI Nicole Hess is a 72 y.o. female.  HPI   72 year old female presents today status post animal bite.  Patient notes she adopted a small cat from the animal shelter.  She notes she has had this cat for ever weeks.  She notes that the cat has all its appropriate vaccinations.  She notes the cat came to her yesterday and was on her lap and then scratched her and ran away.  Notes the cat continued to act normal after the incident.  This is an indoor cat no other animals in the house.  Patient notes minor bleeding to the wounds.  Patient denies any history of diabetes.  She notes her tetanus is up-to-date.  No signs of infection.  Past Medical History:  Diagnosis Date  . Abnormal Pap smear   . Anemia   . Anxiety   . Aortic stenosis   . Bicuspid aortic valve   . Coronary artery disease   . Depression   . Gout   . Heart murmur   . Heart valve problem    will have follow up Echo-Dr Jordan-will have replacement in a couple of years  . Leukopenia    Seen by Dr. Lamonte Sakai, present since 2008, watchful waiting  . Memory difficulty 04/29/2017  . Osteopenia   . Seizures ([place])    as a child  . Shortness of breath dyspnea   . Vertigo 04/29/2017    Patient Active Problem List   Diagnosis Date Noted  . Vertigo 04/29/2017  . Memory difficulty 04/29/2017  . Dementia without behavioral disturbance 02/06/2017  . 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 History of colonic polyps 11/09/2016  . S/P AVR (aortic valve replacement) 04/02/2015  . Aortic stenosis due to bicuspid aortic valve 02/26/2015  . Severe aortic stenosis 05/02/2014  . Precordial pain 01/26/2013  . Depression 03/30/2012  . Hyperlipidemia 03/30/2012    Past  Surgical History:  Procedure Laterality Date  . AORTIC VALVE REPLACEMENT N/A 02/26/2015   Procedure: AORTIC VALVE REPLACEMENT (AVR);  Surgeon: Gaye Pollack, MD;  Location: [facility];  Service: Open Heart Surgery;  Laterality: N/A;  . CARDIAC CATHETERIZATION N/A 01/29/2015   Procedure: Right/Left Heart Cath and Coronary Angiography;  Surgeon: Peter M Martinique, MD;  Location: [facility];  Service: Cardiovascular;  Laterality: N/A;  . CERVICAL CONE BIOPSY    . COLONOSCOPY  2010  . DOPPLER ECHOCARDIOGRAPHY  3/14  . GUM SURGERY    . PARTIAL HYSTERECTOMY    . TEE WITHOUT CARDIOVERSION N/A 02/26/2015   Procedure: TRANSESOPHAGEAL ECHOCARDIOGRAM (TEE);  Surgeon: Gaye Pollack, MD;  Location: [facility];  Service: Open Heart Surgery;  Laterality: N/A;  . TUBAL LIGATION    . VAGINAL HYSTERECTOMY     for dysplasia     OB History    Gravida  0   Para  0   Term  0   Preterm  0   AB  0   Living  0     SAB  0   TAB  0   Ectopic  0   Multiple  0   Live Births  Home Medications    Prior to Admission medications   Medication Sig Start Date End Date Taking? Authorizing Provider  Ascorbic Acid (VITAMIN C) 1000 MG tablet Take 1,000 mg by mouth daily.    [provider]  aspirin 81 MG tablet Take 81 mg by mouth daily.    [provider]  Calcium Carbonate-Vitamin D (CALTRATE 600+D PO) Take 1 tablet by mouth 2 (two) times daily.    [provider]  clindamycin (CLEOCIN) 300 MG capsule Take 600 mg ( 2 caps ) One hour before dental procedure 12/16/16   Martinique, Peter M, MD  donepezil (ARICEPT) 5 MG tablet Take 1 tablet (5 mg total) by mouth at bedtime. 09/24/17   Kathrynn Ducking, MD  doxycycline (VIBRAMYCIN) 100 MG capsule Take 1 capsule (100 mg total) by mouth 2 (two) times daily. 01/19/18   Debara Kamphuis, Dellis Filbert, PA-C  meclizine (ANTIVERT) 12.5 MG tablet Take 1 tablet (12.5 mg total) by mouth 3 (three) times daily as needed for dizziness. 04/20/17   Brunetta Jeans, PA-C  Multiple Vitamins-Minerals (MULTIVITAMIN WOMEN) TABS Take 1 tablet by mouth daily. One a day/Women's    [provider]  Omega-3 Fatty Acids (OMEGA-3 FISH OIL PO) Take 1 capsule by mouth 2 (two) times daily.     [provider]  pravastatin (PRAVACHOL) 80 MG tablet Take 1 tablet (80 mg total) by mouth daily. 05/05/17   Brunetta Jeans, PA-C  primidone (MYSOLINE) 250 MG tablet Take 1 tablet (250 mg total) by mouth 2 (two) times daily. 09/24/17   Kathrynn Ducking, MD  Psyllium (METAMUCIL FIBER PO) Take 1 Dose by mouth daily.    [provider]    Family History Family History  Problem Relation Age of Onset  . Multiple myeloma Mother   . Stroke Father   . Hypertension Father   . Heart disease Father 31  . Kidney disease Father   . Cancer Father   . Cancer Maternal Grandmother        renal  . Osteoarthritis Brother     Social History Social History   Tobacco Use  . Smoking status: Never Smoker  . Smokeless tobacco: Never Used  Substance Use Topics  . Alcohol use: Yes    Alcohol/week: 4.2 oz    Types: 7 Glasses of wine per week    Comment: 1 glass of wine/cranberry juice a day  . Drug use: No     Allergies   Dextromethorphan; Pseudoephedrine; and Penicillins   Review of Systems Review of Systems  All other systems reviewed and are negative.    Physical Exam Updated Vital Signs BP 121/64 (BP Location: Right Arm)   Pulse 70   Temp 99 F (37.2 C) (Oral)   Resp 18   Ht 5' 9"  (1.753 m)   Wt 68 kg (150 lb)   LMP 09/15/1987   SpO2 98%   BMI 22.15 kg/m   Physical Exam  Constitutional: She is oriented to person, place, and time. She appears well-developed and well-nourished.  HENT:  Head: Normocephalic and atraumatic.  Eyes: Pupils are equal, round, and reactive to light. Conjunctivae are normal. Right eye exhibits no discharge. Left eye exhibits no discharge. No scleral icterus.  Neck: Normal range of motion. No JVD  present. No tracheal deviation present.  Pulmonary/Chest: Effort normal. No stridor.  Musculoskeletal:  Several superficial scratches noted to the left thigh, no active bleeding, no surrounding redness, no discharge, no open wounds or obvious foreign bodies  Neurological: She is alert and oriented to person, place, and time. Coordination normal.  Psychiatric: She has a normal mood and affect. Her behavior is normal. Judgment and thought content normal.  Nursing note and vitals reviewed.   ED Treatments / Results  Labs (all labs ordered are listed, but only abnormal results are displayed) Labs Reviewed - No data to display  EKG None  Radiology No results found.  Procedures Procedures (including critical care time)  Medications Ordered in ED Medications - No data to display   Initial Impression / Assessment and Plan / ED Course  I have reviewed the triage vital signs and the nursing notes.  Pertinent labs & imaging results that were available during my care of the patient were reviewed by me and considered in my medical decision making (see chart for details).     Labs:   Imaging:  Consults:  Therapeutics:  Discharge Meds: Doxycycline  Assessment/Plan: 72 year old female presents today with cat scratch.  She has no signs of infection, she will be started on phylactic antibiotics, tetanus is up-to-date.  Patient is unable to take Augmentin secondary to penicillin allergy.  I do have lower suspicion for the likelihood of infection given these are scratches with 1 day history and no signs of infection.  I do not feel patient would benefit from dual therapy including doxycycline and clindamycin given her age.  Patient will be watched closely.  animals vaccinations up-to-date per age.  Strict return precautions given, she verbalized understanding and agreement to today's plan had no further questions or concerns.   Final Clinical Impressions(s) / ED Diagnoses   Final  diagnoses:  Cat scratch    ED Discharge Orders        Ordered    doxycycline (VIBRAMYCIN) 100 MG capsule  2 times daily     01/19/18 1331       Francee Gentile 01/19/18 1333    Carmin Muskrat, MD 01/20/18 2034

## 2018-01-19 NOTE — ED Notes (Signed)
Sign pad not working verbalized understanding of discharge instructions.  

## 2018-01-19 NOTE — Discharge Instructions (Addendum)
Please read attached information. If you experience any new or worsening signs or symptoms please return to the emergency room for evaluation. Please follow-up with your primary care provider or specialist as discussed. Please use medication prescribed only as directed and discontinue taking if you have any concerning signs or symptoms.   °

## 2018-01-22 ENCOUNTER — Encounter (HOSPITAL_COMMUNITY): Payer: Self-pay | Admitting: *Deleted

## 2018-01-22 ENCOUNTER — Emergency Department (HOSPITAL_COMMUNITY)
Admission: EM | Admit: 2018-01-22 | Discharge: 2018-01-22 | Disposition: A | Discharge status: Home / Self Care | Payer: PPO | Attending: Emergency Medicine | Admitting: Emergency Medicine

## 2018-01-22 ENCOUNTER — Other Ambulatory Visit: Payer: Self-pay

## 2018-01-22 DIAGNOSIS — Z2914 Encounter for prophylactic rabies immune globin: Secondary | ICD-10-CM | POA: Insufficient documentation

## 2018-01-22 DIAGNOSIS — S80872A Other superficial bite, left lower leg, initial encounter: Secondary | ICD-10-CM | POA: Insufficient documentation

## 2018-01-22 DIAGNOSIS — W5501XA Bitten by cat, initial encounter: Secondary | ICD-10-CM | POA: Diagnosis not present

## 2018-01-22 DIAGNOSIS — Y939 Activity, unspecified: Secondary | ICD-10-CM | POA: Diagnosis not present

## 2018-01-22 DIAGNOSIS — Y929 Unspecified place or not applicable: Secondary | ICD-10-CM | POA: Insufficient documentation

## 2018-01-22 DIAGNOSIS — Y998 Other external cause status: Secondary | ICD-10-CM | POA: Insufficient documentation

## 2018-01-22 DIAGNOSIS — Z952 Presence of prosthetic heart valve: Secondary | ICD-10-CM | POA: Insufficient documentation

## 2018-01-22 DIAGNOSIS — Z7982 Long term (current) use of aspirin: Secondary | ICD-10-CM | POA: Diagnosis not present

## 2018-01-22 DIAGNOSIS — Z79899 Other long term (current) drug therapy: Secondary | ICD-10-CM | POA: Diagnosis not present

## 2018-01-22 DIAGNOSIS — I2510 Atherosclerotic heart disease of native coronary artery without angina pectoris: Secondary | ICD-10-CM | POA: Diagnosis not present

## 2018-01-22 DIAGNOSIS — Z23 Encounter for immunization: Secondary | ICD-10-CM | POA: Insufficient documentation

## 2018-01-22 DIAGNOSIS — Z8679 Personal history of other diseases of the circulatory system: Secondary | ICD-10-CM | POA: Diagnosis not present

## 2018-01-22 DIAGNOSIS — F0390 Unspecified dementia without behavioral disturbance: Secondary | ICD-10-CM | POA: Insufficient documentation

## 2018-01-22 MED ORDER — RABIES IMMUNE GLOBULIN 150 UNIT/ML IM INJ
20.0000 [IU]/kg | INJECTION | Freq: Once | INTRAMUSCULAR | Status: AC
  Administered 2018-01-22: 1350 [IU] via INTRAMUSCULAR
  Filled 2018-01-22: qty 9

## 2018-01-22 MED ORDER — RABIES VACCINE, PCEC IM SUSR
1.0000 mL | Freq: Once | INTRAMUSCULAR | Status: AC
  Administered 2018-01-22: 1 mL via INTRAMUSCULAR
  Filled 2018-01-22: qty 1

## 2018-01-22 NOTE — ED Notes (Signed)
Explained to pt in detail concerning her follow up to complete the series of rabies IG injections.  Pt verbalized understanding and voiced no further concerns.

## 2018-01-22 NOTE — ED Provider Notes (Signed)
[facility] EMERGENCY DEPARTMENT Provider Note   CSN: [id number] Arrival date & time: 01/22/18  3976     History   Chief Complaint Chief Complaint  Patient presents with  . Animal Bite    HPI KAO BERKHEIMER is a 72 y.o. female with history of CAD, anxiety, depression who presents for rabies vaccination from a cat bite that occurred 2 days ago.  Patient was evaluated and sent home with doxycycline.  She found out later that the kitten that bit her was not updated on its rabies vaccination yet.  [place] and infection control advised her to come to the emergency department to begin rabies series while the cat is being quarantined.  She reports her bites and scratches on her left leg are not painful.  They are mildly purple, she reports.  She denies any fevers.  She has been taking doxycycline as prescribed.  HPI  Past Medical History:  Diagnosis Date  . Abnormal Pap smear   . Anemia   . Anxiety   . Aortic stenosis   . Bicuspid aortic valve   . Coronary artery disease   . Depression   . Gout   . Heart murmur   . Heart valve problem    will have follow up Echo-Dr Jordan-will have replacement in a couple of years  . Leukopenia    Seen by Dr. Lamonte Sakai, present since 2008, watchful waiting  . Memory difficulty 04/29/2017  . Osteopenia   . Seizures ([place])    as a child  . Shortness of breath dyspnea   . Vertigo 04/29/2017    Patient Active Problem List   Diagnosis Date Noted  . Vertigo 04/29/2017  . Memory difficulty 04/29/2017  . Dementia without behavioral disturbance 02/06/2017  . 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 History of colonic polyps 11/09/2016  . S/P AVR (aortic valve replacement) 04/02/2015  . Aortic stenosis due to bicuspid aortic valve 02/26/2015  . Severe aortic stenosis 05/02/2014  . Precordial pain 01/26/2013  . Depression 03/30/2012  . Hyperlipidemia  03/30/2012    Past Surgical History:  Procedure Laterality Date  . AORTIC VALVE REPLACEMENT N/A 02/26/2015   Procedure: AORTIC VALVE REPLACEMENT (AVR);  Surgeon: Gaye Pollack, MD;  Location: [facility];  Service: Open Heart Surgery;  Laterality: N/A;  . CARDIAC CATHETERIZATION N/A 01/29/2015   Procedure: Right/Left Heart Cath and Coronary Angiography;  Surgeon: Peter M Martinique, MD;  Location: [facility];  Service: Cardiovascular;  Laterality: N/A;  . CERVICAL CONE BIOPSY    . COLONOSCOPY  2010  . DOPPLER ECHOCARDIOGRAPHY  3/14  . GUM SURGERY    . PARTIAL HYSTERECTOMY    . TEE WITHOUT CARDIOVERSION N/A 02/26/2015   Procedure: TRANSESOPHAGEAL ECHOCARDIOGRAM (TEE);  Surgeon: Gaye Pollack, MD;  Location: [facility];  Service: Open Heart Surgery;  Laterality: N/A;  . TUBAL LIGATION    . VAGINAL HYSTERECTOMY     for dysplasia     OB History    Gravida  0   Para  0   Term  0   Preterm  0   AB  0   Living  0     SAB  0   TAB  0   Ectopic  0   Multiple  0   Live Births               Home Medications    Prior to  Admission medications   Medication Sig Start Date End Date Taking? Authorizing Provider  Ascorbic Acid (VITAMIN C) 1000 MG tablet Take 1,000 mg by mouth daily.   Yes [provider]  aspirin 81 MG tablet Take 81 mg by mouth daily.   Yes [provider]  Calcium Carbonate-Vitamin D (CALTRATE 600+D PO) Take 1 tablet by mouth 2 (two) times daily.   Yes [provider]  donepezil (ARICEPT) 5 MG tablet Take 1 tablet (5 mg total) by mouth at bedtime. 09/24/17  Yes Kathrynn Ducking, MD  doxycycline (VIBRAMYCIN) 100 MG capsule Take 1 capsule (100 mg total) by mouth 2 (two) times daily. 01/19/18  Yes Hedges, Dellis Filbert, PA-C  meclizine (ANTIVERT) 12.5 MG tablet Take 1 tablet (12.5 mg total) by mouth 3 (three) times daily as needed for dizziness. 04/20/17  Yes Brunetta Jeans, PA-C  Multiple Vitamins-Minerals (MULTIVITAMIN WOMEN) TABS Take 1 tablet  by mouth daily. One a day/Women's   Yes [provider]  Omega-3 Fatty Acids (OMEGA-3 FISH OIL PO) Take 1 capsule by mouth 2 (two) times daily.    Yes [provider]  pravastatin (PRAVACHOL) 80 MG tablet Take 1 tablet (80 mg total) by mouth daily. 05/05/17  Yes Brunetta Jeans, PA-C  primidone (MYSOLINE) 250 MG tablet Take 1 tablet (250 mg total) by mouth 2 (two) times daily. 09/24/17  Yes Kathrynn Ducking, MD  Psyllium (METAMUCIL FIBER PO) Take 1 Dose by mouth daily.   Yes [provider]  clindamycin (CLEOCIN) 300 MG capsule Take 600 mg ( 2 caps ) One hour before dental procedure 12/16/16   Martinique, Peter M, MD    Family History Family History  Problem Relation Age of Onset  . Multiple myeloma Mother   . Stroke Father   . Hypertension Father   . Heart disease Father 41  . Kidney disease Father   . Cancer Father   . Cancer Maternal Grandmother        renal  . Osteoarthritis Brother     Social History Social History   Tobacco Use  . Smoking status: Never Smoker  . Smokeless tobacco: Never Used  Substance Use Topics  . Alcohol use: Not Currently    Alcohol/week: 4.2 oz    Types: 7 Glasses of wine per week    Comment: 1 glass of wine/cranberry juice a day  . Drug use: No     Allergies   Dextromethorphan; Pseudoephedrine; and Penicillins   Review of Systems Review of Systems  Constitutional: Negative for fever.  Skin: Positive for wound.     Physical Exam Updated Vital Signs BP (!) 136/59 (BP Location: Right Arm)   Pulse 65   Temp 98.3 F (36.8 C) (Oral)   Resp 20   LMP 09/15/1987   SpO2 99%   Physical Exam  Constitutional: She appears well-developed and well-nourished. No distress.  HENT:  Head: Normocephalic and atraumatic.  Eyes: Conjunctivae are normal. Right eye exhibits no discharge. Left eye exhibits no discharge. No scleral icterus.  Neck: Normal range of motion.  Cardiovascular: Normal rate, regular rhythm, normal heart  sounds and intact distal pulses. Exam reveals no gallop and no friction rub.  No murmur heard. Pulmonary/Chest: Effort normal and breath sounds normal. No stridor. No respiratory distress. She has no wheezes. She has no rales.  Abdominal: Soft. Bowel sounds are normal. She exhibits no distension. There is no tenderness. There is no rebound and no guarding.  Musculoskeletal: She exhibits no edema.  Neurological: She is alert. Coordination normal.  Skin: Skin is warm and dry. No rash noted. She is not diaphoretic. No pallor.  Well-healing linear, 4 cm wound with surrounding mild ecchymosis, nontender, no erythema or drainage; several small surrounding scratches with scabs,  well-healing  Psychiatric: She has a normal mood and affect.  Nursing note and vitals reviewed.    ED Treatments / Results  Labs (all labs ordered are listed, but only abnormal results are displayed) Labs Reviewed - No data to display  EKG None  Radiology No results found.  Procedures Procedures (including critical care time)  Medications Ordered in ED Medications  rabies immune globulin (HYPERAB/KEDRAB) injection 1,350 Units (1,350 Units Intramuscular Given 01/22/18 1033)  rabies vaccine (RABAVERT) injection 1 mL (1 mL Intramuscular Given 01/22/18 1033)     Initial Impression / Assessment and Plan / ED Course  I have reviewed the triage vital signs and the nursing notes.  Pertinent labs & imaging results that were available during my care of the patient were reviewed by me and considered in my medical decision making (see chart for details).     Patient presenting for rabies vaccination.  Patient was bit by a kitten that was unvaccinated 2 days ago.  She has been taking doxycycline and has had no pain or problems with the area of the bite and scratches.  She was given rabies immunoglobulin and rabies vaccine in the ED.  She is given information about follow-up for the rest of her rabies series.  Return  precautions discussed.  Patient understands and agrees with plan.  Patient vitals stable throughout ED course and discharged in satisfactory condition.  Patient also evaluated by Dr. Gilford Raid who agrees with plan.  Final Clinical Impressions(s) / ED Diagnoses   Final diagnoses:  Cat bite, initial encounter  Need for rabies vaccination    ED Discharge Orders    None       Frederica Kuster, PA-C 01/22/18 1049    Isla Pence, MD 01/22/18 1058

## 2018-01-22 NOTE — Discharge Instructions (Signed)
Please go to the designated location on the day as outlined in your rabies information paperwork.  Please return to the emergency department if you develop any new or worsening symptoms including pain, drainage, or redness to your wounds, or fever.

## 2018-01-22 NOTE — ED Notes (Signed)
Patient is resting gave patient a warm blanket

## 2018-01-22 NOTE — ED Triage Notes (Signed)
Pt is here after being bite 2 days ago from a kitten that has not been vaccinated for rabies yet.  The bite is to the left leg and now has a discoloration of purple, no pain.  She has some smaller bites on right upper leg

## 2018-01-25 ENCOUNTER — Ambulatory Visit (HOSPITAL_COMMUNITY)
Admission: EM | Admit: 2018-01-25 | Discharge: 2018-01-25 | Disposition: A | Discharge status: Home / Self Care | Payer: PPO | Attending: Family Medicine | Admitting: Family Medicine

## 2018-01-25 ENCOUNTER — Encounter (HOSPITAL_COMMUNITY): Payer: Self-pay | Admitting: Emergency Medicine

## 2018-01-25 DIAGNOSIS — Z203 Contact with and (suspected) exposure to rabies: Secondary | ICD-10-CM

## 2018-01-25 DIAGNOSIS — Z23 Encounter for immunization: Secondary | ICD-10-CM | POA: Diagnosis not present

## 2018-01-25 MED ORDER — RABIES VACCINE, PCEC IM SUSR
INTRAMUSCULAR | Status: AC
  Filled 2018-01-25: qty 1

## 2018-01-25 MED ORDER — RABIES VACCINE, PCEC IM SUSR
1.0000 mL | Freq: Once | INTRAMUSCULAR | Status: AC
  Administered 2018-01-25: 1 mL via INTRAMUSCULAR

## 2018-01-25 NOTE — ED Triage Notes (Signed)
Pt here for day 3 rabies injection; given in right arm

## 2018-01-29 ENCOUNTER — Ambulatory Visit (HOSPITAL_COMMUNITY)
Admission: EM | Admit: 2018-01-29 | Discharge: 2018-01-29 | Disposition: A | Discharge status: Home / Self Care | Payer: PPO | Attending: Internal Medicine | Admitting: Internal Medicine

## 2018-01-29 ENCOUNTER — Encounter (HOSPITAL_COMMUNITY): Payer: Self-pay | Admitting: *Deleted

## 2018-01-29 ENCOUNTER — Other Ambulatory Visit: Payer: Self-pay

## 2018-01-29 DIAGNOSIS — Z203 Contact with and (suspected) exposure to rabies: Secondary | ICD-10-CM | POA: Diagnosis not present

## 2018-01-29 DIAGNOSIS — Z23 Encounter for immunization: Secondary | ICD-10-CM | POA: Diagnosis not present

## 2018-01-29 MED ORDER — RABIES VACCINE, PCEC IM SUSR
1.0000 mL | Freq: Once | INTRAMUSCULAR | Status: AC
  Administered 2018-01-29: 1 mL via INTRAMUSCULAR

## 2018-01-29 MED ORDER — RABIES VACCINE, PCEC IM SUSR
INTRAMUSCULAR | Status: AC
  Filled 2018-01-29: qty 1

## 2018-01-29 NOTE — ED Notes (Addendum)
Patient came into office to get her 4th Rabies injection. Medication was given in pt Left Deltoid. Patient verbalized understanding. Patient tolerated medication.

## 2018-01-29 NOTE — ED Triage Notes (Addendum)
Patient is here for her 4th Rabies injection.

## 2018-02-05 ENCOUNTER — Ambulatory Visit (HOSPITAL_COMMUNITY)
Admission: EM | Admit: 2018-02-05 | Discharge: 2018-02-05 | Disposition: A | Discharge status: Home / Self Care | Payer: PPO | Attending: Family Medicine | Admitting: Family Medicine

## 2018-02-05 ENCOUNTER — Encounter (HOSPITAL_COMMUNITY): Payer: Self-pay | Admitting: Emergency Medicine

## 2018-02-05 DIAGNOSIS — Z23 Encounter for immunization: Secondary | ICD-10-CM

## 2018-02-05 DIAGNOSIS — Z203 Contact with and (suspected) exposure to rabies: Secondary | ICD-10-CM | POA: Diagnosis not present

## 2018-02-05 MED ORDER — RABIES VACCINE, PCEC IM SUSR
1.0000 mL | Freq: Once | INTRAMUSCULAR | Status: AC
  Administered 2018-02-05: 1 mL via INTRAMUSCULAR

## 2018-02-05 MED ORDER — RABIES VACCINE, PCEC IM SUSR
INTRAMUSCULAR | Status: AC
  Filled 2018-02-05: qty 1

## 2018-02-05 NOTE — ED Triage Notes (Signed)
Pt here for last rabies injection 

## 2018-02-09 ENCOUNTER — Telehealth: Payer: Self-pay | Admitting: Cardiology

## 2018-02-09 NOTE — Telephone Encounter (Signed)
Returned call to patient Dr.Jordan's recommendation given. 

## 2018-02-09 NOTE — Telephone Encounter (Signed)
New Message   Patient is calling because where she lives they are putting out aerosol cans that spray fragrances. She is concerned that its not safe for her heart. She is requesting a letter to advise that she can not be exposed to the chemicals.  Please call to discuss.

## 2018-02-09 NOTE — Telephone Encounter (Signed)
Talk to primary care about that. I don't see any problem from a cardiac standpoint.   Desiraye Rolfson Martinique MD, [facility]

## 2018-02-17 DIAGNOSIS — H04123 Dry eye syndrome of bilateral lacrimal glands: Secondary | ICD-10-CM | POA: Diagnosis not present

## 2018-02-17 DIAGNOSIS — H35373 Puckering of macula, bilateral: Secondary | ICD-10-CM | POA: Diagnosis not present

## 2018-02-17 DIAGNOSIS — H524 Presbyopia: Secondary | ICD-10-CM | POA: Diagnosis not present

## 2018-02-17 DIAGNOSIS — H18421 Band keratopathy, right eye: Secondary | ICD-10-CM | POA: Diagnosis not present

## 2018-02-17 DIAGNOSIS — H2513 Age-related nuclear cataract, bilateral: Secondary | ICD-10-CM | POA: Diagnosis not present

## 2018-03-01 DIAGNOSIS — H04123 Dry eye syndrome of bilateral lacrimal glands: Secondary | ICD-10-CM | POA: Diagnosis not present

## 2018-03-01 DIAGNOSIS — H15002 Unspecified scleritis, left eye: Secondary | ICD-10-CM | POA: Diagnosis not present

## 2018-03-03 ENCOUNTER — Telehealth: Payer: Self-pay | Admitting: Cardiology

## 2018-03-03 NOTE — Telephone Encounter (Signed)
These eye drops are fine  Shantae Vantol Martinique MD, [facility]

## 2018-03-03 NOTE — Telephone Encounter (Signed)
FORWARD TO DR Martinique AND [facility]

## 2018-03-03 NOTE — Telephone Encounter (Signed)
Spoke to patient. Information given voiced understanding

## 2018-03-03 NOTE — Telephone Encounter (Signed)
New Message:       Pt c/o medication issue:  1. Name of Medication: prednisolone 1%  2. How are you currently taking this medication (dosage and times per day)? 2 times aday  3. Are you having a reaction (difficulty breathing--STAT)? No  4. What is your medication issue? Pt would like to know if these eye drops will conflict with any of the medications she is taking currently

## 2018-03-08 DIAGNOSIS — H8120 Vestibular neuronitis, unspecified ear: Secondary | ICD-10-CM | POA: Diagnosis not present

## 2018-03-09 DIAGNOSIS — R413 Other amnesia: Secondary | ICD-10-CM | POA: Diagnosis not present

## 2018-03-09 DIAGNOSIS — E785 Hyperlipidemia, unspecified: Secondary | ICD-10-CM | POA: Diagnosis not present

## 2018-03-09 DIAGNOSIS — R42 Dizziness and giddiness: Secondary | ICD-10-CM | POA: Diagnosis not present

## 2018-03-18 ENCOUNTER — Encounter

## 2018-03-18 ENCOUNTER — Ambulatory Visit: Payer: PPO | Admitting: Obstetrics & Gynecology

## 2018-03-30 ENCOUNTER — Telehealth: Payer: Self-pay | Admitting: Cardiology

## 2018-03-30 NOTE — Telephone Encounter (Signed)
New Message:       Pt states she is needing the Dr. Ailene Ravel to be able to go outside during the day.

## 2018-03-30 NOTE — Telephone Encounter (Signed)
Called patient back. Informed patient that she should take precautions when being out in the heat. Informed patient to stay hydrated when outside. Informed patient that she should go walking inside somewhere like a mall if it's too hot outside. Patient verbalized understanding.

## 2018-03-31 ENCOUNTER — Telehealth: Payer: Self-pay | Admitting: Obstetrics & Gynecology

## 2018-03-31 NOTE — Telephone Encounter (Signed)
Message not needed. °

## 2018-04-05 ENCOUNTER — Ambulatory Visit (INDEPENDENT_AMBULATORY_CARE_PROVIDER_SITE_OTHER): Payer: PPO | Admitting: Obstetrics & Gynecology

## 2018-04-05 ENCOUNTER — Other Ambulatory Visit: Payer: Self-pay | Admitting: Neurology

## 2018-04-05 ENCOUNTER — Other Ambulatory Visit: Payer: Self-pay

## 2018-04-05 ENCOUNTER — Encounter: Payer: Self-pay | Admitting: Obstetrics & Gynecology

## 2018-04-05 ENCOUNTER — Telehealth: Payer: Self-pay | Admitting: Cardiology

## 2018-04-05 VITALS — BP 120/70 | HR 72 | Resp 14 | Ht 68.0 in | Wt 149.1 lb

## 2018-04-05 DIAGNOSIS — Z01419 Encounter for gynecological examination (general) (routine) without abnormal findings: Secondary | ICD-10-CM | POA: Diagnosis not present

## 2018-04-05 NOTE — Telephone Encounter (Signed)
Called patient, she states she noticed slight swelling in ankles at her OB appointment. Patient has no SOB, and no other symptoms. Patient was advised to monitor her fluid intake, and to elevate her legs at home when sitting. Patient verbalized understanding and will call back if swelling does not improve.

## 2018-04-05 NOTE — Telephone Encounter (Signed)
New Message    Pt c/o swelling: STAT is pt has developed SOB within 24 hours  1) How much weight have you gained and in what time span? None    2) If swelling, where is the swelling located? Swelling in feet   3) Are you currently taking a fluid pill? no  4) Are you currently SOB? No   Do you have a log of your daily weights (if so, list)? No  Have you gained 3 pounds in a day or 5 pounds in a week? No  5) Have you traveled recently?    Patient is calling because she went to see obgyn today and her feet was swollen. She contributed to her shoes but the obgyn wanted her to contact the cardiologist.

## 2018-04-05 NOTE — Progress Notes (Signed)
72 y.o. G0P0000 SingleCaucasianF here for annual exam.  Doing well.  Had an urgent care in May after getting a cat.  Had to go through rabies injections after being clawed up and down.  Denies vaginal bleeding.    Patient's last menstrual period was 09/15/1987.          Sexually active: Yes.    The current method of family planning is status post hysterectomy.    Exercising: Yes.    walking Smoker:  no  Health Maintenance: Pap:  09/17/15 Neg  04/20/13 Neg  History of abnormal Pap:  Yes, remote hx of dysplasia  MMG:  06/03/16 BIRADS2:Benign  Colonoscopy:  10/23/13 polyp. F/u 5 years BMD:   10/14/16 osteopenia  TDaP:  2018 Pneumonia vaccine(s):  Has completed both Shingrix:  Zostavax 2017 Hep C testing: 05/13/16 Neg  Screening Labs: PCP, Hb today: PCP, Urine today: not collected   reports that she has never smoked. She has never used smokeless tobacco. She reports that she drank about 4.2 oz of alcohol per week. She reports that she does not use drugs.  Past Medical History:  Diagnosis Date  . Abnormal Pap smear   . Anemia   . Anxiety   . Aortic stenosis   . Bicuspid aortic valve   . Coronary artery disease   . Depression   . Gout   . Heart murmur   . Heart valve problem    will have follow up Echo-Dr Jordan-will have replacement in a couple of years  . Leukopenia    Seen by Dr. Lamonte Sakai, present since 2008, watchful waiting  . Memory difficulty 04/29/2017  . Osteopenia   . Seizures ([place])    as a child  . Shortness of breath dyspnea   . Vertigo 04/29/2017    Past Surgical History:  Procedure Laterality Date  . AORTIC VALVE REPLACEMENT N/A 02/26/2015   Procedure: AORTIC VALVE REPLACEMENT (AVR);  Surgeon: Gaye Pollack, MD;  Location: [facility];  Service: Open Heart Surgery;  Laterality: N/A;  . CARDIAC CATHETERIZATION N/A 01/29/2015   Procedure: Right/Left Heart Cath and Coronary Angiography;  Surgeon: Peter M Martinique, MD;  Location: [facility];  Service: Cardiovascular;   Laterality: N/A;  . CERVICAL CONE BIOPSY    . COLONOSCOPY  2010  . DOPPLER ECHOCARDIOGRAPHY  3/14  . GUM SURGERY    . PARTIAL HYSTERECTOMY    . TEE WITHOUT CARDIOVERSION N/A 02/26/2015   Procedure: TRANSESOPHAGEAL ECHOCARDIOGRAM (TEE);  Surgeon: Gaye Pollack, MD;  Location: [facility];  Service: Open Heart Surgery;  Laterality: N/A;  . TUBAL LIGATION    . VAGINAL HYSTERECTOMY     for dysplasia    Current Outpatient Medications  Medication Sig Dispense Refill  . Ascorbic Acid (VITAMIN C) 1000 MG tablet Take 1,000 mg by mouth daily.    Marland Kitchen aspirin 81 MG tablet Take 81 mg by mouth daily.    . Calcium Carbonate-Vitamin D (CALTRATE 600+D PO) Take 1 tablet by mouth 2 (two) times daily.    . clindamycin (CLEOCIN) 300 MG capsule Take 600 mg ( 2 caps ) One hour before dental procedure 6 capsule 3  . donepezil (ARICEPT) 5 MG tablet Take 1 tablet (5 mg total) by mouth at bedtime. 30 tablet 4  . doxycycline (VIBRAMYCIN) 100 MG capsule Take 1 capsule (100 mg total) by mouth 2 (two) times daily. 10 capsule 0  . meclizine (ANTIVERT) 12.5 MG tablet Take 1 tablet (12.5 mg total) by mouth 3 (three)  times daily as needed for dizziness. 15 tablet 0  . Multiple Vitamins-Minerals (MULTIVITAMIN WOMEN) TABS Take 1 tablet by mouth daily. One a day/Women's    . Omega-3 Fatty Acids (OMEGA-3 FISH OIL PO) Take 1 capsule by mouth 2 (two) times daily.     . pravastatin (PRAVACHOL) 80 MG tablet Take 1 tablet (80 mg total) by mouth daily. 30 tablet 0  . primidone (MYSOLINE) 250 MG tablet Take 1 tablet (250 mg total) by mouth 2 (two) times daily. 60 tablet 5  . Psyllium (METAMUCIL FIBER PO) Take 1 Dose by mouth daily.     No current facility-administered medications for this visit.     Family History  Problem Relation Age of Onset  . Multiple myeloma Mother   . Stroke Father   . Hypertension Father   . Heart disease Father 63  . Kidney disease Father   . Cancer Father   . Cancer Maternal Grandmother        renal  .  Osteoarthritis Brother     Review of Systems  All other systems reviewed and are negative.   Exam:   Vitals:   04/05/18 1231  BP: 120/70  Pulse: 72  Resp: 14    General appearance: alert, cooperative and appears stated age Head: Normocephalic, without obvious abnormality, atraumatic Neck: no adenopathy, supple, symmetrical, trachea midline and thyroid normal to inspection and palpation Lungs: clear to auscultation bilaterally Breasts: normal appearance, no masses or tenderness Heart: regular rate and rhythm Abdomen: soft, non-tender; bowel sounds normal; no masses,  no organomegaly Extremities: extremities normal, atraumatic, no cyanosis or edema Skin: Skin color, texture, turgor normal. No rashes or lesions Lymph nodes: Cervical, supraclavicular, and axillary nodes normal. No abnormal inguinal nodes palpated Neurologic: Grossly normal   Pelvic: External genitalia:  no lesions              Urethra:  normal appearing urethra with no masses, tenderness or lesions              Bartholins and Skenes: normal                 Vagina: normal appearing vagina with normal color and discharge, no lesions              Cervix: absent              Pap taken: No. Bimanual Exam:  Uterus:  uterus absent              Adnexa: no mass, fullness, tenderness               Rectovaginal: Confirms               Anus:  normal sphincter tone, no lesions  Chaperone was present for exam.  A:  Well Woman with normal exam PMP, no HRT Remote hx of cervical dysplasia, S/p TVH H/O laparoscopy BSO 908 Osteopenia H/O gout LE edema, bilateral H/O aortic valve repair 6/16  P:   Mammogram guidelines reviewed.  Pt will do this year in September.  Guidelines reviewed. pap smear not indicated Pt is going to follow up with Dr. Martinique regarding LE edema return annually or prn

## 2018-04-06 ENCOUNTER — Telehealth: Payer: Self-pay

## 2018-04-06 NOTE — Telephone Encounter (Signed)
Patient came in as a walk in appointment for a nurse to look at her swollen ankles. I escorted patient to room, checked BP it was 122/74, no pitting edema, and swelling noted in feet, legs, or arms. Slight swelling noted in ankles.   Advised patient to watch salt intake, to elevate legs in the evenings, and to try compression stockings especially when she begins to walk again.   Patient was advised to recheck swelling in about a week and to call back if worse or no improvement with the above recommendations and we would try to get her an appointment to be seen.  Patient verbalized understanding, no questions or concerns.

## 2018-04-27 ENCOUNTER — Telehealth: Payer: Self-pay | Admitting: Obstetrics & Gynecology

## 2018-04-27 ENCOUNTER — Encounter: Payer: Self-pay | Admitting: Certified Nurse Midwife

## 2018-04-27 ENCOUNTER — Other Ambulatory Visit: Payer: Self-pay

## 2018-04-27 ENCOUNTER — Ambulatory Visit: Payer: Self-pay | Admitting: Certified Nurse Midwife

## 2018-04-27 ENCOUNTER — Ambulatory Visit (INDEPENDENT_AMBULATORY_CARE_PROVIDER_SITE_OTHER): Payer: PPO | Admitting: Certified Nurse Midwife

## 2018-04-27 VITALS — BP 110/70 | HR 68 | Temp 97.4°F | Resp 16 | Ht 68.0 in | Wt 147.0 lb

## 2018-04-27 DIAGNOSIS — R350 Frequency of micturition: Secondary | ICD-10-CM | POA: Diagnosis not present

## 2018-04-27 DIAGNOSIS — N390 Urinary tract infection, site not specified: Secondary | ICD-10-CM | POA: Diagnosis not present

## 2018-04-27 DIAGNOSIS — H04123 Dry eye syndrome of bilateral lacrimal glands: Secondary | ICD-10-CM | POA: Diagnosis not present

## 2018-04-27 DIAGNOSIS — N3941 Urge incontinence: Secondary | ICD-10-CM | POA: Diagnosis not present

## 2018-04-27 DIAGNOSIS — H5213 Myopia, bilateral: Secondary | ICD-10-CM | POA: Diagnosis not present

## 2018-04-27 DIAGNOSIS — H02052 Trichiasis without entropian right lower eyelid: Secondary | ICD-10-CM | POA: Diagnosis not present

## 2018-04-27 DIAGNOSIS — H35373 Puckering of macula, bilateral: Secondary | ICD-10-CM | POA: Diagnosis not present

## 2018-04-27 LAB — POCT URINALYSIS DIPSTICK
Bilirubin, UA: NEGATIVE
GLUCOSE UA: NEGATIVE
Ketones, UA: NEGATIVE
LEUKOCYTES UA: NEGATIVE
Nitrite, UA: NEGATIVE
PH UA: 5 (ref 5.0–8.0)
Protein, UA: NEGATIVE
RBC UA: NEGATIVE
UROBILINOGEN UA: NEGATIVE U/dL — AB

## 2018-04-27 MED ORDER — NITROFURANTOIN MONOHYD MACRO 100 MG PO CAPS: ORAL_CAPSULE | ORAL | 0 refills | Status: DC

## 2018-04-27 NOTE — Telephone Encounter (Signed)
Spoke with patient. Patient reports increased urinary frequency for the last wk. States she always drinks a lot of water, but never experienced the frequency that she is having. Denies any other GYN symptoms, lower back pain, fever/chills.   OV recommended for further evaluation, patient aware [name] is out of the office, agreeable to scheduling with covering provider. OV scheduled for today at 2pm with Melvia Heaps, CNM. Patient verbalizes understanding and is agreeable. Encounter closed.

## 2018-04-27 NOTE — Telephone Encounter (Signed)
Patient is having an issue with urine frequency that recently started.

## 2018-04-27 NOTE — Patient Instructions (Signed)

## 2018-04-27 NOTE — Telephone Encounter (Signed)
Patient returned call, states she has another appt at 3:30pm today. OV rescheduled to earlier time, 12:45pm today with Melvia Heaps, CNM.

## 2018-04-27 NOTE — Progress Notes (Signed)
72 y.o. Single Caucasian female G0P0000 here with complaint of UTI, with onset  on 2 days ago.. Patient complaining of urinary frequency/urgency/  with urination. Patient denies fever, chills, nausea or back pain. No new personal products. Denies any vaginal symptoms.. Menopausal with vaginal dryness. Patient is drinking adequate water intake. Patient aware she is having urinary leakage at times, and wears 2 pads throughout day and night. Does not change often. Denies vaginal bleeding or pain. No other concerns today.  Review of Systems  Constitutional: Negative.   HENT: Negative.   Eyes: Negative.   Respiratory: Negative.   Cardiovascular: Negative.   Gastrointestinal: Negative.   Genitourinary: Positive for frequency.  Musculoskeletal: Negative.   Skin: Negative.   Neurological: Negative.   Endo/Heme/Allergies: Negative.   Psychiatric/Behavioral: Negative.     O: Healthy female WDWN Affect: Normal, orientation x 3,  Skin : warm and dry CVAT: negative bilateral Abdomen: positive for suprapubic tenderness  Pelvic exam: External genital area: normal, no lesions, atrophic in appearance Bladder,Urethra tender, Urethral meatus: tender, red and prominent, strong urine odor noted Vagina: normal  vaginal discharge, atrophic appearance   Cervix: absent Uterus: absent Adnexa: normal non tender, no fullness or masses   A: UTI per exam Normal pelvic exam Urinary incontinence at times poct urine-neg, strong odor  P: Reviewed findings of UTI and need for treatment. Discussed also wearing pads for long periods of time can increase risk of UTI. Questions addressed NH:[id number] see order with instructions FXO:VANVB:, culture  Reviewed warning signs and symptoms of UTI and need to advise if occurring. Encouraged to limit soda, tea, and coffee and be sure to increase water intake. Start on cranberry capsule daily to help with bladder health after finishes antibiotic. Notify if leaking  increases.Discussed possible depend use at hs for better coverage.   RV prn

## 2018-04-28 ENCOUNTER — Telehealth: Payer: Self-pay | Admitting: Obstetrics & Gynecology

## 2018-04-28 LAB — URINE CULTURE

## 2018-04-28 NOTE — Telephone Encounter (Signed)
Patient left voicemail during lunch. Patient has questions regarding medication. Patient speaking in broken sentences and difficult to understand message.

## 2018-04-28 NOTE — Telephone Encounter (Signed)
Spoke with patient. Advised patient of message as seen below from Nicole Hess. Patient states she has had a hard time reaching her primary. Will call PCP tomorrow if she is still having problems following taking her last pill tonight. If she is unable to reach her PCP will call our office if she is having worsening symptoms. Advised awaiting culture results and will contact her as soon as these return. Patient verbalizes understanding and seems less confused this phone call. Is able to repeat back recommendations and verbalizes understanding.  Routing to provider for final review. Patient agreeable to disposition. Will close encounter.

## 2018-04-28 NOTE — Telephone Encounter (Signed)
Spoke with patient. Patient was seen on 04/27/2018 with Nicole Hess CNM. Patient was placed on Macrobid 100 MG BID x 3 days. For urinary frequency/urgency x 2 days. Patient states she has 1 tablet of Macrobid left and is still having symptoms. Advised patient that she should still have 4 tablets left as she is to take this BID. Should have taken one last night and one this morning. Patient has been taking 2 at a time and is unclear when she took the medication. Has increased her water intake. Urine was sent for culture and has not returned yet. Patient appears confused. Advised will review with Dr.Jertson and return call.

## 2018-04-28 NOTE — Telephone Encounter (Signed)
If the patient seems confused, she should be seen by her primary MD.  Per office CMA, the patient is often confused.   She can take the one macrobid tonight and f/u with primary in the am. If she has fevers, seems very confused, significant pain she needs to be seen in the ER.  If you are worried about the patient, call her emergency contact.

## 2018-04-29 ENCOUNTER — Telehealth: Payer: Self-pay | Admitting: Obstetrics & Gynecology

## 2018-04-29 ENCOUNTER — Other Ambulatory Visit: Payer: Self-pay | Admitting: Certified Nurse Midwife

## 2018-04-29 ENCOUNTER — Other Ambulatory Visit: Payer: Self-pay | Admitting: Emergency Medicine

## 2018-04-29 ENCOUNTER — Telehealth: Payer: Self-pay | Admitting: Emergency Medicine

## 2018-04-29 ENCOUNTER — Telehealth: Payer: Self-pay | Admitting: Certified Nurse Midwife

## 2018-04-29 DIAGNOSIS — N390 Urinary tract infection, site not specified: Secondary | ICD-10-CM

## 2018-04-29 DIAGNOSIS — R413 Other amnesia: Secondary | ICD-10-CM | POA: Diagnosis not present

## 2018-04-29 DIAGNOSIS — R300 Dysuria: Secondary | ICD-10-CM | POA: Diagnosis not present

## 2018-04-29 NOTE — Telephone Encounter (Signed)
Incoming call from patient. She has seen her PCP this morning at [facility] but feels she did not get enough time to discuss urinary frequency.   Office visit with [name] scheduled and patient very happy with this plan as she would like to discuss her concerns with [name].   We discussed again the message from Dr. Quincy Simmonds regarding negative urine culture and avoiding bladder irritants. She verbalizes understanding of instructions and will call back if any concerns prior to appointment.

## 2018-04-29 NOTE — Telephone Encounter (Signed)
-----   Message from Nunzio Cobbs, MD sent at 04/29/2018  4:19 AM EDT ----- Please check on patient today and report negative urine culture results.  She called in yesterday with concerns.  I recommend that the patient stop her Macrobid, if she has any remaining.  For frequency and urgency, stop bladder irritants - anything with caffeine (coffee, tea, sodas, chocolate), carbonated beverages, acidic or spicy foods and beverages. If her symptoms persist, office visit with [name].

## 2018-04-29 NOTE — Telephone Encounter (Signed)
Patient is still having urinary issues. Patient states she phoned her primary care and is still waiting on a response. Patient is asking to talk with Dr.Miller's nurse.

## 2018-04-29 NOTE — Telephone Encounter (Signed)
Spoke with patient and message from Dr. Quincy Simmonds given. Pt verbalized understnading.  She is seeing her PCP at [facility] today. She will call back if any concerns or if needs an office visit with [name].  Encounter closed.

## 2018-04-29 NOTE — Telephone Encounter (Signed)
Patient calling for urine results.

## 2018-04-29 NOTE — Telephone Encounter (Signed)
Return call to patient. Discussed with patient again results and plan of care. Office visit on Tuesday with [name].  The patient states "well what am I supposed to do in the mean time, its embarrassing to go out."  She has been seen by her PCP this morning and was sent home.  Discussed with patient using depends or briefs for coverage at night and while out of the home, but not to wear them when she is at home and able to void independently.   Discussed her caregivers, she states she lives alone and she does not need anyone to assist her.  I advised that someone should check on her over the weekend to ensure she is hydrating appropriately.  She states she will call her "caregiver" [name] who the patient states usually calls her later at night.  I asked about her other emergency contact Adria Dill who patient states that she is her cousin but is a Sports coach in [place]. Since the patient is contacting [name] I asked if it was okay If I contacted [name] (on designated party release form) and leave her a message to ask if someone could check on her over the week. Ms. Rasco agreed to this.   I called [name] on patients designated party release form and left a detailed message and advised that Ms. Ertle had been complaining of urinary frequency and that her testing here at our office was negative and that she has a follow up appointment this week with [name] and wanted to see if someone could check on her over the weekend. Left my contact information for follow up.   Will route to Dr. Quincy Simmonds as covering physician and close encounter.

## 2018-05-03 ENCOUNTER — Encounter: Payer: Self-pay | Admitting: Obstetrics & Gynecology

## 2018-05-03 ENCOUNTER — Other Ambulatory Visit: Payer: Self-pay

## 2018-05-03 ENCOUNTER — Emergency Department (HOSPITAL_COMMUNITY)
Admission: EM | Admit: 2018-05-03 | Discharge: 2018-05-03 | Disposition: A | Discharge status: Home / Self Care | Payer: PPO | Attending: Emergency Medicine | Admitting: Emergency Medicine

## 2018-05-03 ENCOUNTER — Emergency Department (HOSPITAL_COMMUNITY): Payer: PPO

## 2018-05-03 ENCOUNTER — Encounter (HOSPITAL_COMMUNITY): Payer: Self-pay | Admitting: Emergency Medicine

## 2018-05-03 ENCOUNTER — Ambulatory Visit (INDEPENDENT_AMBULATORY_CARE_PROVIDER_SITE_OTHER): Payer: PPO | Admitting: Obstetrics & Gynecology

## 2018-05-03 VITALS — BP 118/80 | HR 64 | Resp 16 | Ht 68.0 in | Wt 144.6 lb

## 2018-05-03 DIAGNOSIS — Z7982 Long term (current) use of aspirin: Secondary | ICD-10-CM | POA: Diagnosis not present

## 2018-05-03 DIAGNOSIS — Z79899 Other long term (current) drug therapy: Secondary | ICD-10-CM | POA: Insufficient documentation

## 2018-05-03 DIAGNOSIS — F0390 Unspecified dementia without behavioral disturbance: Secondary | ICD-10-CM | POA: Insufficient documentation

## 2018-05-03 DIAGNOSIS — R410 Disorientation, unspecified: Secondary | ICD-10-CM

## 2018-05-03 DIAGNOSIS — R4182 Altered mental status, unspecified: Secondary | ICD-10-CM | POA: Diagnosis not present

## 2018-05-03 DIAGNOSIS — I2510 Atherosclerotic heart disease of native coronary artery without angina pectoris: Secondary | ICD-10-CM | POA: Insufficient documentation

## 2018-05-03 DIAGNOSIS — R404 Transient alteration of awareness: Secondary | ICD-10-CM | POA: Diagnosis not present

## 2018-05-03 DIAGNOSIS — R3915 Urgency of urination: Secondary | ICD-10-CM

## 2018-05-03 LAB — URINALYSIS, ROUTINE W REFLEX MICROSCOPIC
BILIRUBIN URINE: NEGATIVE
Bacteria, UA: NONE SEEN
GLUCOSE, UA: NEGATIVE mg/dL
KETONES UR: NEGATIVE mg/dL
Leukocytes, UA: NEGATIVE
NITRITE: NEGATIVE
PH: 7 (ref 5.0–8.0)
Protein, ur: NEGATIVE mg/dL
SPECIFIC GRAVITY, URINE: 1.011 (ref 1.005–1.030)

## 2018-05-03 LAB — POCT URINALYSIS DIPSTICK
Bilirubin, UA: NEGATIVE
GLUCOSE UA: NEGATIVE
KETONES UA: NEGATIVE
Leukocytes, UA: NEGATIVE
Nitrite, UA: NEGATIVE
PH UA: 7 (ref 5.0–8.0)
Protein, UA: NEGATIVE
RBC UA: NEGATIVE
UROBILINOGEN UA: 0.2 U/dL

## 2018-05-03 LAB — COMPREHENSIVE METABOLIC PANEL
ALBUMIN: 3.9 g/dL (ref 3.5–5.0)
ALK PHOS: 141 U/L — AB (ref 38–126)
ALT: 39 U/L (ref 0–44)
ANION GAP: 13 (ref 5–15)
AST: 57 U/L — AB (ref 15–41)
BILIRUBIN TOTAL: 0.6 mg/dL (ref 0.3–1.2)
BUN: 19 mg/dL (ref 8–23)
CALCIUM: 9.5 mg/dL (ref 8.9–10.3)
CO2: 26 mmol/L (ref 22–32)
Chloride: 104 mmol/L (ref 98–111)
Creatinine, Ser: 0.83 mg/dL (ref 0.44–1.00)
GFR calc Af Amer: >60 mL/min (ref >60)
GLUCOSE: 84 mg/dL (ref 70–99)
POTASSIUM: 4.1 mmol/L (ref 3.5–5.1)
Sodium: 143 mmol/L (ref 135–145)
TOTAL PROTEIN: 7.5 g/dL (ref 6.5–8.1)

## 2018-05-03 LAB — CBC
HEMATOCRIT: 42.6 % (ref 36.0–46.0)
Hemoglobin: 13.5 g/dL (ref 12.0–15.0)
MCH: 31.6 pg (ref 26.0–34.0)
MCHC: 31.7 g/dL (ref 30.0–36.0)
MCV: 99.8 fL (ref 78.0–100.0)
Platelets: 134 10*3/uL — ABNORMAL LOW (ref 150–400)
RBC: 4.27 MIL/uL (ref 3.87–5.11)
RDW: 12.8 % (ref 11.5–15.5)
WBC: 3.8 10*3/uL — AB (ref 4.0–10.5)

## 2018-05-03 LAB — TSH: TSH: 1.358 u[IU]/mL (ref 0.350–4.500)

## 2018-05-03 LAB — CBG MONITORING, ED: GLUCOSE-CAPILLARY: 77 mg/dL (ref 70–99)

## 2018-05-03 NOTE — Progress Notes (Signed)
CSW consulted as staff concerned as pt presented with AMS from [facility] reached out to pt's [place] [phone]. Marsena expressed that she and pt's cousin have both been assisting with pt at best. CSW spoke with pt and POA at bedside to discuss possible options to get more assistance for pt as needed. CSW was informed that POA would be looking into Private Care services for pt as pt is wanting to stay in the home. POA has agreed to tae pt back home and assist as needed. At this time there are no further CSW needs. CSW will sign off.   Virgie Dad. Shaquetta Arcos, [name], [facility] Clinical Social Worker [phone]

## 2018-05-03 NOTE — Progress Notes (Signed)
GYNECOLOGY  VISIT  CC:   Urinary frequency  HPI: 72 y.o. G0P0000 Single Caucasian female here for urinary urgency that has been bothering her for about a week.  She was seen on 8/14 by Melvia Heaps, CNM, for complaint about urinary symptoms.  Urine culture was obtained.    Dr. She went to [facility] last week and pt reports she was told that she needed to see her neurologist and the provider "did nothing for my bladder issues".  Reports she had a lot of blood work done that day.  I do not have access to these records at this time.  She is newly having urinary leakage.  She was seen about a month ago and she did not have these complaints at that time.  She is now purchasing pads for this concern.  Pt is clearly confused today.  She was seen here by mid level provider and her PCP the same day and she does not recall these dates exactly.  She also saw me about a month ago and she states this was last week.  She denies headache, visual changes, walking/gait issues.  Denies pain.  Upon asking my staff about her arrival today, she was found wandering the hall outside our office before coming into the office.  States she needed to do something on another floor in our building and went upstairs for this.  She called four times on Friday for this appointment.  My triage nurse was concerend enough about her that her emergency contact was called.  This person never called back my office.  I was not in the office on Friday due to planned vacation and am just now aware of this.  She knows her name but did not get her DOB correct or today's date correct.    GYNECOLOGIC HISTORY: Patient's last menstrual period was 09/15/1987. Contraception: hysterectomy  Menopausal hormone therapy: none  Patient Active Problem List   Diagnosis Date Noted  . Vertigo 04/29/2017  . Memory difficulty 04/29/2017  . Dementia without behavioral disturbance 02/06/2017  . 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 History of colonic polyps 11/09/2016  . S/P AVR (aortic valve replacement) 04/02/2015  . Aortic stenosis due to bicuspid aortic valve 02/26/2015  . Severe aortic stenosis 05/02/2014  . Precordial pain 01/26/2013  . Depression 03/30/2012  . Hyperlipidemia 03/30/2012    Past Medical History:  Diagnosis Date  . Abnormal Pap smear   . Anemia   . Anxiety   . Aortic stenosis   . Bicuspid aortic valve   . Coronary artery disease   . Depression   . Gout   . Heart murmur   . Heart valve problem    will have follow up Echo-Dr Jordan-will have replacement in a couple of years  . Leukopenia    Seen by Dr. Lamonte Sakai, present since 2008, watchful waiting  . Memory difficulty 04/29/2017  . Osteopenia   . Seizures ([place])    as a child  . Shortness of breath dyspnea   . Vertigo 04/29/2017  . Vertigo     Past Surgical History:  Procedure Laterality Date  . AORTIC VALVE REPLACEMENT N/A 02/26/2015   Procedure: AORTIC VALVE REPLACEMENT (AVR);  Surgeon: Gaye Pollack, MD;  Location: [facility];  Service: Open Heart Surgery;  Laterality: N/A;  . CARDIAC CATHETERIZATION N/A 01/29/2015   Procedure: Right/Left Heart Cath and Coronary Angiography;  Surgeon: Peter M Martinique, MD;  Location: [facility];  Service: Cardiovascular;  Laterality: N/A;  . CERVICAL CONE BIOPSY    . COLONOSCOPY  2010  . DOPPLER ECHOCARDIOGRAPHY  3/14  . GUM SURGERY    . PARTIAL HYSTERECTOMY    . TEE WITHOUT CARDIOVERSION N/A 02/26/2015   Procedure: TRANSESOPHAGEAL ECHOCARDIOGRAM (TEE);  Surgeon: Gaye Pollack, MD;  Location: [facility];  Service: Open Heart Surgery;  Laterality: N/A;  . TUBAL LIGATION    . VAGINAL HYSTERECTOMY     for dysplasia    MEDS:   Current Outpatient Medications on File Prior to Visit  Medication Sig Dispense Refill  . Ascorbic Acid (VITAMIN C) 1000 MG tablet Take 1,000 mg by mouth daily.    Marland Kitchen aspirin 81 MG tablet Take 81 mg by mouth daily.    . Calcium  Carbonate-Vitamin D (CALTRATE 600+D PO) Take 1 tablet by mouth 2 (two) times daily.    Marland Kitchen donepezil (ARICEPT) 5 MG tablet TAKE ONE TABLET AT BEDTIME. 30 tablet 5  . Multiple Vitamins-Minerals (MULTIVITAMIN WOMEN) TABS Take 1 tablet by mouth daily. One a day/Women's    . Omega-3 Fatty Acids (OMEGA-3 FISH OIL PO) Take 1 capsule by mouth 2 (two) times daily.     . pravastatin (PRAVACHOL) 80 MG tablet Take 1 tablet (80 mg total) by mouth daily. 30 tablet 0  . primidone (MYSOLINE) 250 MG tablet Take 1 tablet (250 mg total) by mouth 2 (two) times daily. 60 tablet 5  . Psyllium (METAMUCIL FIBER PO) Take 1 Dose by mouth daily.    . clindamycin (CLEOCIN) 300 MG capsule Take 600 mg ( 2 caps ) One hour before dental procedure (Patient not taking: Reported on 05/03/2018) 6 capsule 3   No current facility-administered medications on file prior to visit.     ALLERGIES: Dextromethorphan; Pseudoephedrine; and Penicillins  Family History  Problem Relation Age of Onset  . Multiple myeloma Mother   . Stroke Father   . Hypertension Father   . Heart disease Father 48  . Kidney disease Father   . Cancer Father   . Cancer Maternal Grandmother        renal  . Osteoarthritis Brother     SH:  single  Review of Systems  Genitourinary: Positive for urgency.  All other systems reviewed and are negative.   PHYSICAL EXAMINATION:    BP 118/80 (BP Location: Right Arm, Patient Position: Sitting, Cuff Size: Normal)   Pulse 64   Resp 16   Ht _0  (1.727 m)   Wt 144 lb 9.6 oz (65.6 kg)   LMP 09/15/1987   BMI 21.99 kg/m     General appearance: alert, cooperative and appears stated age CV:  Regular rate and rhythm Lungs:  clear to auscultation, no wheezes, rales or rhonchi, symmetric air entry  Assessment: Cognitive impairment of new onset as pt was seen 04/05/18 by me personally  Plan: Will send pt non-emergently to the ER.  Have contact EMS for this.  Am currently on hold with ER to communicate my  concerns for awareness with transfer.     ~25 minutes spent with patient >50% of time was in face to face discussion of above.

## 2018-05-03 NOTE — ED Notes (Signed)
Lunch Tray Ordered @ 1406-per RN-called by Levada Dy

## 2018-05-03 NOTE — ED Triage Notes (Addendum)
Patient presents to the ED from GYN office for complaints of AMS. Patient alert and oriented to person and time not place on arrival. Which GYN MD states not baseline. Patient ambulated to the restroom with EMS. Patient able to follow commands. EMS reports patient went to see GYN for urinary frequency but has had two negative UTIs. Patient has history of Dementia

## 2018-05-03 NOTE — ED Notes (Signed)
Discharge instructions discussed with Pt. Pt verbalized understanding. Pt stable and leaving via WC.    

## 2018-05-03 NOTE — ED Notes (Signed)
Social Work

## 2018-05-03 NOTE — ED Provider Notes (Signed)
[facility] EMERGENCY DEPARTMENT Provider Note   CSN: [id number] Arrival date & time: 05/03/18  1204     History   Chief Complaint Chief Complaint  Patient presents with  . Altered Mental Status    HPI Nicole Hess is a 72 y.o. female.  The history is provided by the patient.  Altered Mental Status   This is a chronic problem. The current episode started more than 1 week ago. The problem has not changed since onset.Associated symptoms include confusion. Pertinent negatives include no somnolence, no seizures, no unresponsiveness, no weakness, no agitation, no delusions, no hallucinations, no self-injury and no violence. Risk factors: Patient new diagnosis of dementia. Her past medical history is significant for dementia (recent diagnosis of dementia per PCP note). Her past medical history does not include hypertension.    Past Medical History:  Diagnosis Date  . Abnormal Pap smear   . Anemia   . Anxiety   . Aortic stenosis   . Bicuspid aortic valve   . Coronary artery disease   . Depression   . Gout   . Heart murmur   . Heart valve problem    will have follow up Echo-Dr Jordan-will have replacement in a couple of years  . Leukopenia    Seen by Dr. Lamonte Sakai, present since 2008, watchful waiting  . Memory difficulty 04/29/2017  . Osteopenia   . Seizures ([place])    as a child  . Shortness of breath dyspnea   . Vertigo 04/29/2017  . Vertigo     Patient Active Problem List   Diagnosis Date Noted  . Vertigo 04/29/2017  . Memory difficulty 04/29/2017  . Dementia without behavioral disturbance 02/06/2017  . 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 History of colonic polyps 11/09/2016  . S/P AVR (aortic valve replacement) 04/02/2015  . Aortic stenosis due to bicuspid aortic valve 02/26/2015  . Severe aortic stenosis 05/02/2014  . Precordial pain 01/26/2013  . Depression 03/30/2012  .  Hyperlipidemia 03/30/2012    Past Surgical History:  Procedure Laterality Date  . AORTIC VALVE REPLACEMENT N/A 02/26/2015   Procedure: AORTIC VALVE REPLACEMENT (AVR);  Surgeon: Gaye Pollack, MD;  Location: [facility];  Service: Open Heart Surgery;  Laterality: N/A;  . CARDIAC CATHETERIZATION N/A 01/29/2015   Procedure: Right/Left Heart Cath and Coronary Angiography;  Surgeon: Peter M Martinique, MD;  Location: [facility];  Service: Cardiovascular;  Laterality: N/A;  . CERVICAL CONE BIOPSY    . COLONOSCOPY  2010  . DOPPLER ECHOCARDIOGRAPHY  3/14  . GUM SURGERY    . PARTIAL HYSTERECTOMY    . TEE WITHOUT CARDIOVERSION N/A 02/26/2015   Procedure: TRANSESOPHAGEAL ECHOCARDIOGRAM (TEE);  Surgeon: Gaye Pollack, MD;  Location: [facility];  Service: Open Heart Surgery;  Laterality: N/A;  . TUBAL LIGATION    . VAGINAL HYSTERECTOMY     for dysplasia     OB History    Gravida  0   Para  0   Term  0   Preterm  0   AB  0   Living  0     SAB  0   TAB  0   Ectopic  0   Multiple  0   Live Births               Home Medications    Prior to Admission medications   Medication Sig Start Date End Date Taking? Authorizing Provider  Ascorbic Acid (VITAMIN C) 1000 MG tablet Take 1,000 mg by mouth daily.   Yes [provider]  aspirin 81 MG tablet Take 81 mg by mouth daily.   Yes [provider]  Calcium Carbonate-Vitamin D (CALTRATE 600+D PO) Take 1 tablet by mouth 2 (two) times daily.   Yes [provider]  donepezil (ARICEPT) 5 MG tablet TAKE ONE TABLET AT BEDTIME. Patient taking differently: Take 5 mg by mouth at bedtime.  04/05/18  Yes Kathrynn Ducking, MD  Multiple Vitamins-Minerals (MULTIVITAMIN WOMEN) TABS Take 1 tablet by mouth daily. One a day/Women's   Yes [provider]  Omega-3 Fatty Acids (OMEGA-3 FISH OIL PO) Take 1 capsule by mouth 2 (two) times daily.    Yes [provider]  pravastatin (PRAVACHOL) 80 MG tablet Take 1 tablet  (80 mg total) by mouth daily. 05/05/17  Yes Brunetta Jeans, PA-C  primidone (MYSOLINE) 250 MG tablet Take 1 tablet (250 mg total) by mouth 2 (two) times daily. 09/24/17  Yes Kathrynn Ducking, MD  Psyllium (METAMUCIL FIBER PO) Take 1 Dose by mouth daily.   Yes [provider]    Family History Family History  Problem Relation Age of Onset  . Multiple myeloma Mother   . Stroke Father   . Hypertension Father   . Heart disease Father 58  . Kidney disease Father   . Cancer Father   . Cancer Maternal Grandmother        renal  . Osteoarthritis Brother     Social History Social History   Tobacco Use  . Smoking status: Never Smoker  . Smokeless tobacco: Never Used  Substance Use Topics  . Alcohol use: Not Currently  . Drug use: No     Allergies   Dextromethorphan; Pseudoephedrine; and Penicillins   Review of Systems Review of Systems  Constitutional: Negative for chills and fever.  HENT: Negative for ear pain and sore throat.   Eyes: Negative for pain and visual disturbance.  Respiratory: Negative for cough and shortness of breath.   Cardiovascular: Negative for chest pain and palpitations.  Gastrointestinal: Negative for abdominal pain and vomiting.  Genitourinary: Negative for dysuria and hematuria.  Musculoskeletal: Negative for arthralgias and back pain.  Skin: Negative for color change and rash.  Neurological: Negative for seizures, syncope and weakness.  Psychiatric/Behavioral: Positive for confusion. Negative for agitation, hallucinations and self-injury.  All other systems reviewed and are negative.    Physical Exam Updated Vital Signs BP 127/87   Pulse 71   Temp 97.8 F (36.6 C) (Oral)   Resp 14   Ht 5' 8"  (1.727 m)   Wt 65.5 kg   LMP 09/15/1987   SpO2 100%   BMI 21.96 kg/m   Physical Exam  Constitutional: She is oriented to person, place, and time. She appears well-developed and well-nourished. No distress.  HENT:  Head: Normocephalic  and atraumatic.  Eyes: Pupils are equal, round, and reactive to light. Conjunctivae and EOM are normal.  Neck: Normal range of motion. Neck supple.  Cardiovascular: Normal rate, regular rhythm, normal heart sounds and intact distal pulses.  No murmur heard. Pulmonary/Chest: Effort normal and breath sounds normal. No respiratory distress. She has no wheezes.  Abdominal: Soft. There is no tenderness.  Musculoskeletal: Normal range of motion. She exhibits no edema.  Neurological: She is alert and oriented to person, place, and time. No cranial nerve deficit or sensory deficit. She exhibits normal muscle tone. Coordination normal.  5+5 out of  strength throughout, normal sensation, no drift, normal gait, normal finger-to-nose finger, patient with some confusion answering questions  Skin: Skin is warm and dry. Capillary refill takes less than 2 seconds.  Psychiatric: She has a normal mood and affect.  Nursing note and vitals reviewed.    ED Treatments / Results  Labs (all labs ordered are listed, but only abnormal results are displayed) Labs Reviewed  COMPREHENSIVE METABOLIC PANEL - Abnormal; Notable for the following components:      Result Value   AST 57 (*)    Alkaline Phosphatase 141 (*)    All other components within normal limits  CBC - Abnormal; Notable for the following components:   WBC 3.8 (*)    Platelets 134 (*)    All other components within normal limits  URINALYSIS, ROUTINE W REFLEX MICROSCOPIC - Abnormal; Notable for the following components:   Hgb urine dipstick SMALL (*)    All other components within normal limits  TSH  CBG MONITORING, ED    EKG EKG Interpretation  Date/Time:  Tuesday May 03 2018 12:13:46 EDT Ventricular Rate:  61 PR Interval:    QRS Duration: 95 QT Interval:  441 QTC Calculation: 445 R Axis:   0 Text Interpretation:  Sinus rhythm Confirmed by Lennice Sites [phone]) on 05/03/2018 12:50:06 PM   Radiology Ct Head Wo Contrast  Result  Date: 05/03/2018 CLINICAL DATA:  New onset confusion. EXAM: CT HEAD WITHOUT CONTRAST TECHNIQUE: Contiguous axial images were obtained from the base of the skull through the vertex without intravenous contrast. COMPARISON:  MRI 05/04/2017 FINDINGS: Brain: No evidence of acute infarction, hemorrhage, hydrocephalus, extra-axial collection or mass lesion/mass effect. Vascular: No hyperdense vessel or unexpected calcification. Skull: Normal. Negative for fracture or focal lesion. Sinuses/Orbits: No acute finding. Other: None. IMPRESSION: Negative exam. Electronically Signed   By: Nolon Nations M.D.   On: 05/03/2018 13:27    Procedures Procedures (including critical care time)  Medications Ordered in ED Medications - No data to display   Initial Impression / Assessment and Plan / ED Course  I have reviewed the triage vital signs and the nursing notes.  Pertinent labs & imaging results that were available during my care of the patient were reviewed by me and considered in my medical decision making (see chart for details).     KIMIYE STRATHMAN is a 72 year old female history of seizures, CAD, dementia who presents to the ED with confusion.  Patient with unremarkable vitals.  No fever.  Patient sent from gynecology office as they were concerned about her confusion.  Patient overall well-appearing.  Denies any symptoms.  She does appear to be slightly confused answering questions but is overall neurologically intact on exam.  Upon chart review it appears that patient has had some memory problems over the last several months and is possibly being diagnosed with Alzheimer's dementia.  She does appear still have the right to drive her car about 25 to 30 miles from home.  She does live on her own.  Patient denies any chest pain, shortness of breath.  She has had some urinary urgency but otherwise has no symptoms.  No abdominal pain on exam.  Clear breath sounds.  EKG shows sinus rhythm with no signs of  ischemic changes.  Patient had head CT that showed no acute findings.  History and physical is not consistent with an acute stroke.  Patient otherwise no significant anemia, electrolyte abnormality, kidney injury.  Urinalysis shows no signs of infection.  Contacted social  work to help seek family help for further care as patient does live by herself but today it does not appear there are any acute changes.   Social work was able to get in touch with patient's power of attorney who came to the ED and safely take the patient back home.  This chart was dictated using voice recognition software.  Despite best efforts to proofread,  errors can occur which can change the documentation meaning.    Final Clinical Impressions(s) / ED Diagnoses   Final diagnoses:  Altered mental status, unspecified altered mental status type    ED Discharge Orders    None       Lennice Sites, DO 05/03/18 1448

## 2018-05-04 ENCOUNTER — Telehealth: Payer: Self-pay | Admitting: Obstetrics & Gynecology

## 2018-05-04 ENCOUNTER — Ambulatory Visit (HOSPITAL_COMMUNITY)
Admission: EM | Admit: 2018-05-04 | Discharge: 2018-05-04 | Disposition: A | Discharge status: Home / Self Care | Payer: PPO | Attending: Family Medicine | Admitting: Family Medicine

## 2018-05-04 DIAGNOSIS — R4182 Altered mental status, unspecified: Secondary | ICD-10-CM

## 2018-05-04 DIAGNOSIS — R350 Frequency of micturition: Secondary | ICD-10-CM

## 2018-05-04 NOTE — ED Notes (Signed)
Patient is unable to void at this time 

## 2018-05-04 NOTE — ED Provider Notes (Signed)
[facility]   CC: urinary frequency  SUBJECTIVE:  Nicole Hess is a 72 y.o. female hx significant for seizures, CAD, and dementia who complains of urinary frequency for the past  week.  Was seen in the ED yesterday for AMS.  Patient was transported by EMS to ED from PCP office due to new onset AMS. CT negative.  Denies fever, chills, nausea, vomiting, chest pain, SOB, seizures, somnolence, unresponsiveness, weakness, agitation, delusions, hallucinations, suicidal or homicidal ideation.    LMP: Patient's last menstrual period was 09/15/1987.  ROS: As in HPI.  Past Medical History:  Diagnosis Date  . Abnormal Pap smear   . Anemia   . Anxiety   . Aortic stenosis   . Bicuspid aortic valve   . Coronary artery disease   . Depression   . Gout   . Heart murmur   . Heart valve problem    will have follow up Echo-Dr Jordan-will have replacement in a couple of years  . Leukopenia    Seen by Dr. Lamonte Sakai, present since 2008, watchful waiting  . Memory difficulty 04/29/2017  . Osteopenia   . Seizures ([place])    as a child  . Shortness of breath dyspnea   . Vertigo 04/29/2017  . Vertigo    Past Surgical History:  Procedure Laterality Date  . AORTIC VALVE REPLACEMENT N/A 02/26/2015   Procedure: AORTIC VALVE REPLACEMENT (AVR);  Surgeon: Gaye Pollack, MD;  Location: [facility];  Service: Open Heart Surgery;  Laterality: N/A;  . CARDIAC CATHETERIZATION N/A 01/29/2015   Procedure: Right/Left Heart Cath and Coronary Angiography;  Surgeon: Peter M Martinique, MD;  Location: [facility];  Service: Cardiovascular;  Laterality: N/A;  . CERVICAL CONE BIOPSY    . COLONOSCOPY  2010  . DOPPLER ECHOCARDIOGRAPHY  3/14  . GUM SURGERY    . PARTIAL HYSTERECTOMY    . TEE WITHOUT CARDIOVERSION N/A 02/26/2015   Procedure: TRANSESOPHAGEAL ECHOCARDIOGRAM (TEE);  Surgeon: Gaye Pollack, MD;  Location: [facility];  Service: Open Heart Surgery;  Laterality: N/A;  . TUBAL LIGATION    . VAGINAL HYSTERECTOMY       for dysplasia   Allergies  Allergen Reactions  . Dextromethorphan Other (See Comments)    contraindication  . Pseudoephedrine Other (See Comments)    contriindication  . Penicillins Rash    Has patient had a PCN reaction causing immediate rash, facial/tongue/throat swelling, SOB or lightheadedness with hypotension: No Has patient had a PCN reaction causing severe rash involving mucus membranes or skin necrosis: No Has patient had a PCN reaction that required hospitalization No Has patient had a PCN reaction occurring within the last 10 years: No If all of the above answers are "NO", then may proceed with Cephalosporin use.   No current facility-administered medications on file prior to encounter.    Current Outpatient Medications on File Prior to Encounter  Medication Sig Dispense Refill  . Ascorbic Acid (VITAMIN C) 1000 MG tablet Take 1,000 mg by mouth daily.    Marland Kitchen aspirin 81 MG tablet Take 81 mg by mouth daily.    . Calcium Carbonate-Vitamin D (CALTRATE 600+D PO) Take 1 tablet by mouth 2 (two) times daily.    Marland Kitchen donepezil (ARICEPT) 5 MG tablet TAKE ONE TABLET AT BEDTIME. (Patient taking differently: Take 5 mg by mouth at bedtime. ) 30 tablet 5  . Multiple Vitamins-Minerals (MULTIVITAMIN WOMEN) TABS Take 1 tablet by mouth daily. One a day/Women's    . Omega-3 Fatty  Acids (OMEGA-3 FISH OIL PO) Take 1 capsule by mouth 2 (two) times daily.     . pravastatin (PRAVACHOL) 80 MG tablet Take 1 tablet (80 mg total) by mouth daily. 30 tablet 0  . primidone (MYSOLINE) 250 MG tablet Take 1 tablet (250 mg total) by mouth 2 (two) times daily. 60 tablet 5  . Psyllium (METAMUCIL FIBER PO) Take 1 Dose by mouth daily.     Social History   Socioeconomic History  . Marital status: Single    Spouse name: Not on file  . Number of children: Not on file  . Years of education: 77  . Highest education level: Not on file  Occupational History  . 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  Financial resource strain: Not on file  . Food insecurity:    Worry: Not on file    Inability: Not on file  . Transportation needs:    Medical: Not on file    Non-medical: Not on file  Tobacco Use  . Smoking status: Never Smoker  . Smokeless tobacco: Never Used  Substance and Sexual Activity  . Alcohol use: Not Currently  . Drug use: No  . Sexual activity: Not Currently    Partners: Male    Birth control/protection: Surgical    Comment: TVH  Lifestyle  . Physical activity:    Days per week: Not on file    Minutes per session: Not on file  . Stress: Not on file  Relationships  . Social connections:    Talks on phone: Not on file    Gets together: Not on file    Attends religious service: Not on file    Active member of club or organization: Not on file    Attends meetings of clubs or organizations: Not on file    Relationship status: Not on file  . Intimate partner violence:    Fear of current or ex partner: Not on file    Emotionally abused: Not on file    Physically abused: Not on file    Forced sexual activity: Not on file  Other Topics Concern  . Not on file  Social History Narrative   Lives   Caffeine HUD:JSHFWY every morning   Family History  Problem Relation Age of Onset  . Multiple myeloma Mother   . Stroke Father   . Hypertension Father   . Heart disease Father 75  . Kidney disease Father   . Cancer Father   . Cancer Maternal Grandmother        renal  . Osteoarthritis Brother     OBJECTIVE:  Vitals:   05/04/18 1508 05/04/18 1510  BP:  101/62  Pulse: 75   Resp: 16   Temp: 98 F (36.7 C)   SpO2: 100%   Weight:  150 lb (68 kg)   General appearance: AO to person, place, date, and DOB; active and alert HEENT: NCAT.  PERRL.  Oropharynx clear.  Lungs: clear to auscultation bilaterally without adventitious breath sounds Heart: Murmur present.  Radial pulses 2+ symmetrical bilaterally Abdomen: soft; non-distended; no tenderness; bowel sounds  present; no masses or organomegaly; no guarding or rebound tenderness Extremities: no edema; symmetrical with no gross deformities Skin: warm and dry Neurologic: CN2-12 grossly intact; strength and sensation about the UE and LE intact; negative pronator drift; finger to nose without difficulty; Ambulates from chair to exam table without difficulty Psychological: cooperative; confused; walking up and down hallway  [place]  ASSESSMENT & PLAN:  Discussed patient case with Dr. Mannie Stabile.  No immediate threat to life.  Hx anf PE unremarkable.  Full work-up in the ED on 05/03/18.  Patient discharged in stable condition with care taker, POA.  Will follow up with PCP for further evaluation and management.  Appointment with neurology on 05/10/18.    1. Altered mental status, unspecified altered mental status type   2. Urinary frequency     No orders of the defined types were placed in this encounter.  Unable to give urine sample today. Follow up with PCP for further evaluation and management POA called to pick up patient due to AMS.  Patient aware and in agreement with this plan.   Appt with neurology on 05/10/18.     Outlined signs and symptoms indicating need for more acute intervention. Patient verbalized understanding. After Visit Summary given.     Lestine Box, PA-C 05/04/18 1921

## 2018-05-04 NOTE — Telephone Encounter (Signed)
Patient left message over lunch requesting appointment.

## 2018-05-04 NOTE — ED Notes (Signed)
Spoke to Shannon friend per medical record.  Explained concerns for patient's safety.  Will come by 6pm.  Notified dr hagler and brittany, pa

## 2018-05-04 NOTE — Telephone Encounter (Signed)
I will defer to [name] for care of the patients urinary symptoms.  She may decide if the patient would benefit from consutation by urology.   Cc- [name]

## 2018-05-04 NOTE — Telephone Encounter (Signed)
Spoke with patient. She states that her urinary frequency continues. Not worse, but not better.   She states she is unable to leave home due to urinary frequency and urge incontinence.   She does not complain of increased thirst, hunger or fatigue. Has not been increasing fluids. No pain.  Seen yesterday by [name] and sent to ED change in neuro status.   Pt has appointment next week in Neurology for follow up.    Advised will send message to Dr. Miller/covering MD for advice on urinary frequency and urge incontinence.

## 2018-05-04 NOTE — Discharge Instructions (Addendum)
Unable to give urine sample today. Follow up with PCP for further evaluation and management POA called to pick up patient due to AMS.  Patient aware and in agreement with this plan.

## 2018-05-04 NOTE — Telephone Encounter (Signed)
Patient called and requested an appointment for continued problems with urinary frequency. She was seen yesterday by [name]. Routing to triage for further assessment.

## 2018-05-04 NOTE — ED Triage Notes (Signed)
uti x 1 week or more

## 2018-05-05 DIAGNOSIS — N3281 Overactive bladder: Secondary | ICD-10-CM | POA: Diagnosis not present

## 2018-05-06 ENCOUNTER — Encounter: Payer: Self-pay | Admitting: Obstetrics & Gynecology

## 2018-05-06 MED ORDER — MIRABEGRON ER 25 MG PO TB24: 25.0000 mg | ORAL_TABLET | Freq: Every day | ORAL | 0 refills | Status: DC

## 2018-05-06 NOTE — Telephone Encounter (Signed)
Pt returned call and confirms she has a prescription for myrbetriq 25 mg from [facility].   She state she continues to have frequency and this RN encouraged patient to continue with prescription as written.   She would like to see [name] in follow up. Appointment scheduled for 05/17/2018.   Encounter to [name] for update and closed.

## 2018-05-06 NOTE — Telephone Encounter (Signed)
Spoke with patient and she is driving. Requested call back from her to discuss message from [name].   She states she has been seen by [facility] this week and was started on medication for urinary frequency. Unsure what medication it is. Called [facility] on [address] they confirm that patient has a prescription for myrbetriq.  Spoke with pharmacist and DC order from [name].   Pt will call back when she gets home to talk with RN.

## 2018-05-06 NOTE — Progress Notes (Signed)
1530: Patient walked into office requesting to speak with this RN.  She states she was at [facility] urology to obtain appointment information and wanted to give [place] this information so [name] can know when she is seen by [facility].   Pt has her appointment card and this is reviewed with patient. She is seeing Azucena Fallen NP at [facility] on 05/11/18 at 1030 and again on 06/02/18 at 1315.   Pt asks where to buy adult diapers and states she has kotex pads but wants more coverage as we have discussed.   I advised Ms. Jonsson on how to obtain adult incontinence items and she states she will obtain them.   I called her emergency contact Marsena (a [place]) and left a message.   Ms. Hibbard left ambulatory without issue and knows she can call our office if any problems. Reinforced need to continue with Myrbetriq as prescribed by [facility] and may take a few days to see a difference in frequency.   [name] called back at 1610 and stated she was out of town but that she is aware of Ms. Clermont's condition. Reports that she does fine on her own and states that she can get "fixated" on something and won't leave it alone.  She states that the patients medical power of attorney who lives in [place] should be going to her neurology appointment next week.   Left message for [place] assistant to call me back for further care coordination.   Will route update to [name] and close encounter.

## 2018-05-06 NOTE — Telephone Encounter (Signed)
Pt has several normal urine tests and/or cultures in the past two weeks.  This may be a medication side effect with the aricept.  However, ok to start Myrbetriq 25mg  daily to see if will help.  Recheck 1 week.

## 2018-05-08 ENCOUNTER — Ambulatory Visit (HOSPITAL_COMMUNITY)
Admission: EM | Admit: 2018-05-08 | Discharge: 2018-05-08 | Disposition: A | Discharge status: Home / Self Care | Payer: PPO | Attending: Internal Medicine | Admitting: Internal Medicine

## 2018-05-08 ENCOUNTER — Encounter (HOSPITAL_COMMUNITY): Payer: Self-pay | Admitting: *Deleted

## 2018-05-08 DIAGNOSIS — G40409 Other generalized epilepsy and epileptic syndromes, not intractable, without status epilepticus: Secondary | ICD-10-CM | POA: Diagnosis not present

## 2018-05-08 DIAGNOSIS — E785 Hyperlipidemia, unspecified: Secondary | ICD-10-CM | POA: Diagnosis not present

## 2018-05-08 DIAGNOSIS — R350 Frequency of micturition: Secondary | ICD-10-CM | POA: Diagnosis not present

## 2018-05-08 DIAGNOSIS — Z952 Presence of prosthetic heart valve: Secondary | ICD-10-CM | POA: Insufficient documentation

## 2018-05-08 DIAGNOSIS — Z7982 Long term (current) use of aspirin: Secondary | ICD-10-CM | POA: Diagnosis not present

## 2018-05-08 DIAGNOSIS — M8580 Other specified disorders of bone density and structure, unspecified site: Secondary | ICD-10-CM | POA: Diagnosis not present

## 2018-05-08 DIAGNOSIS — Z880 Allergy status to penicillin: Secondary | ICD-10-CM | POA: Insufficient documentation

## 2018-05-08 DIAGNOSIS — M109 Gout, unspecified: Secondary | ICD-10-CM | POA: Diagnosis not present

## 2018-05-08 DIAGNOSIS — R4182 Altered mental status, unspecified: Secondary | ICD-10-CM | POA: Insufficient documentation

## 2018-05-08 DIAGNOSIS — M1611 Unilateral primary osteoarthritis, right hip: Secondary | ICD-10-CM | POA: Diagnosis not present

## 2018-05-08 DIAGNOSIS — Z90711 Acquired absence of uterus with remaining cervical stump: Secondary | ICD-10-CM | POA: Insufficient documentation

## 2018-05-08 DIAGNOSIS — I2510 Atherosclerotic heart disease of native coronary artery without angina pectoris: Secondary | ICD-10-CM | POA: Diagnosis not present

## 2018-05-08 DIAGNOSIS — R42 Dizziness and giddiness: Secondary | ICD-10-CM | POA: Insufficient documentation

## 2018-05-08 DIAGNOSIS — Z79899 Other long term (current) drug therapy: Secondary | ICD-10-CM | POA: Insufficient documentation

## 2018-05-08 DIAGNOSIS — R358 Other polyuria: Secondary | ICD-10-CM | POA: Diagnosis not present

## 2018-05-08 DIAGNOSIS — F0390 Unspecified dementia without behavioral disturbance: Secondary | ICD-10-CM | POA: Insufficient documentation

## 2018-05-08 DIAGNOSIS — F329 Major depressive disorder, single episode, unspecified: Secondary | ICD-10-CM | POA: Insufficient documentation

## 2018-05-08 DIAGNOSIS — F419 Anxiety disorder, unspecified: Secondary | ICD-10-CM | POA: Diagnosis not present

## 2018-05-08 DIAGNOSIS — R3589 Other polyuria: Secondary | ICD-10-CM

## 2018-05-08 LAB — POCT URINALYSIS DIP (DEVICE)
Bilirubin Urine: NEGATIVE
GLUCOSE, UA: NEGATIVE mg/dL
Hgb urine dipstick: NEGATIVE
Ketones, ur: NEGATIVE mg/dL
Nitrite: NEGATIVE
PROTEIN: NEGATIVE mg/dL
Specific Gravity, Urine: 1.015 (ref 1.005–1.030)
Urobilinogen, UA: 0.2 mg/dL (ref 0.0–1.0)
pH: 7.5 (ref 5.0–8.0)

## 2018-05-08 NOTE — ED Triage Notes (Signed)
C/O polyuria x 2 wks.  States "went to [facility] - they couldn't find anything; went to [facility] yesterday - they didn't help me, and I called today and they said not to call back".

## 2018-05-08 NOTE — ED Notes (Signed)
Spoke to sara-out of town.  Will attempt to make arrangements for a ride home for patient.  Explained staff not comfortable with patient driving at this point.

## 2018-05-08 NOTE — Discharge Instructions (Signed)
Please continue with Myrbetriq as prescribed by urology.  Continue to follow up with urology as well as neurology.  Urine has been sent for culture, no indications of infection today.  Will notify you of any positive findings and if any changes to treatment are needed.

## 2018-05-08 NOTE — ED Notes (Signed)
Called marsena-listed in medical record is out of town.  Instructed to get sara's number from patient.  Patient gave [phone] as sara's number.  Have called this number, left message

## 2018-05-08 NOTE — ED Notes (Signed)
"  Nicole Hess" arranged for uber driver.  uber driver aware of patient's name and address.  Patient had information related to what uber driver was driving.  Patient agreeable to getting in car.  Driver had patient's address.  He quoted address and patient verified

## 2018-05-08 NOTE — ED Provider Notes (Signed)
[place]    CSN: [id number] Arrival date & time: 05/08/18  1619     History   Chief Complaint Chief Complaint  Patient presents with  . Polyuria    HPI Nicole Hess is a 72 y.o. female.   [name] presents today with complaints of urinary frequency which has been ongoing for weeks. Has not worsened. Has been seeing her [facility]necologist, [facility] as well as has been to UC and Er all in the past 10 days without specific findings. Was started on Myrbetriq from [facility]. Patient has been found to have altered mental status, had an entire work up for this in the Er 8/20, which was negative. Has two POA's, but sounds like does reside alone. No apparent bruising or injury. She is alert, oriented to time and place. Very forgetful, and poor historian. She states she drove to the clinic today. Has appointment with neurology this week. Hx of anxiety, aortic valve, cad, gout, osteopenia, seizures, vertigo.    ROS per HPI.      Past Medical History:  Diagnosis Date  . Abnormal Pap smear   . Anemia   . Anxiety   . Aortic stenosis   . Bicuspid aortic valve   . Coronary artery disease   . Depression   . Gout   . Heart murmur   . Heart valve problem    will have follow up Echo-Dr Jordan-will have replacement in a couple of years  . Leukopenia    Seen by Dr. Lamonte Sakai, present since 2008, watchful waiting  . Memory difficulty 04/29/2017  . Osteopenia   . Seizures ([place])    as a child  . Shortness of breath dyspnea   . Vertigo 04/29/2017  . Vertigo     Patient Active Problem List   Diagnosis Date Noted  . Vertigo 04/29/2017  . Memory difficulty 04/29/2017  . Dementia without behavioral disturbance 02/06/2017  . 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 History of colonic polyps 11/09/2016  . S/P AVR (aortic valve replacement) 04/02/2015  . Aortic stenosis due to bicuspid aortic valve 02/26/2015  .  Severe aortic stenosis 05/02/2014  . Precordial pain 01/26/2013  . Depression 03/30/2012  . Hyperlipidemia 03/30/2012    Past Surgical History:  Procedure Laterality Date  . AORTIC VALVE REPLACEMENT N/A 02/26/2015   Procedure: AORTIC VALVE REPLACEMENT (AVR);  Surgeon: Gaye Pollack, MD;  Location: [place];  Service: Open Heart Surgery;  Laterality: N/A;  . CARDIAC CATHETERIZATION N/A 01/29/2015   Procedure: Right/Left Heart Cath and Coronary Angiography;  Surgeon: Peter M Martinique, MD;  Location: [place] [facility];  Service: Cardiovascular;  Laterality: N/A;  . CERVICAL CONE BIOPSY    . COLONOSCOPY  2010  . DOPPLER ECHOCARDIOGRAPHY  3/14  . GUM SURGERY    . PARTIAL HYSTERECTOMY    . TEE WITHOUT CARDIOVERSION N/A 02/26/2015   Procedure: TRANSESOPHAGEAL ECHOCARDIOGRAM (TEE);  Surgeon: Gaye Pollack, MD;  Location: [place];  Service: Open Heart Surgery;  Laterality: N/A;  . TUBAL LIGATION    . VAGINAL HYSTERECTOMY     for dysplasia    OB History    Gravida  0   Para  0   Term  0   Preterm  0   AB  0   Living  0     SAB  0   TAB  0   Ectopic  0   Multiple  0  Live Births               Home Medications    Prior to Admission medications   Medication Sig Start Date End Date Taking? Authorizing Provider  Ascorbic Acid (VITAMIN C) 1000 MG tablet Take 1,000 mg by mouth daily.   Yes [provider]  aspirin 81 MG tablet Take 81 mg by mouth daily.   Yes [provider]  Calcium Carbonate-Vitamin D (CALTRATE 600+D PO) Take 1 tablet by mouth 2 (two) times daily.   Yes [provider]  donepezil (ARICEPT) 5 MG tablet TAKE ONE TABLET AT BEDTIME. Patient taking differently: Take 5 mg by mouth at bedtime.  04/05/18  Yes Kathrynn Ducking, MD  Multiple Vitamins-Minerals (MULTIVITAMIN WOMEN) TABS Take 1 tablet by mouth daily. One a day/Women's   Yes [provider]  Omega-3 Fatty Acids (OMEGA-3 FISH OIL PO) Take 1 capsule by mouth 2 (two)  times daily.    Yes [provider]  pravastatin (PRAVACHOL) 80 MG tablet Take 1 tablet (80 mg total) by mouth daily. 05/05/17  Yes Brunetta Jeans, PA-C  primidone (MYSOLINE) 250 MG tablet Take 1 tablet (250 mg total) by mouth 2 (two) times daily. 09/24/17  Yes Kathrynn Ducking, MD  Psyllium (METAMUCIL FIBER PO) Take 1 Dose by mouth daily.   Yes [provider]  mirabegron ER (MYRBETRIQ) 25 MG TB24 tablet Take 25 mg by mouth daily.    [provider]    Family History Family History  Problem Relation Age of Onset  . Multiple myeloma Mother   . Stroke Father   . Hypertension Father   . Heart disease Father 65  . Kidney disease Father   . Cancer Father   . Cancer Maternal Grandmother        renal  . Osteoarthritis Brother     Social History Social History   Tobacco Use  . Smoking status: Never Smoker  . Smokeless tobacco: Never Used  Substance Use Topics  . Alcohol use: Not Currently  . Drug use: No     Allergies   Dextromethorphan; Pseudoephedrine; and Penicillins   Review of Systems Review of Systems   Physical Exam Triage Vital Signs ED Triage Vitals  Enc Vitals Group     BP 05/08/18 1713 (!) 141/66     Pulse Rate 05/08/18 1713 (!) 59     Resp 05/08/18 1713 18     Temp 05/08/18 1713 98.1 F (36.7 C)     Temp Source 05/08/18 1713 Oral     SpO2 05/08/18 1713 100 %     Weight --      Height --      Head Circumference --      Peak Flow --      Pain Score 05/08/18 1715 0     Pain Loc --      Pain Edu? --      Excl. in [place]? --    No data found.  Updated Vital Signs BP (!) 141/66   Pulse (!) 59   Temp 98.1 F (36.7 C) (Oral)   Resp 18   LMP 09/15/1987   SpO2 100%    Physical Exam  Constitutional: She appears well-developed and well-nourished. No distress.  HENT:  Head: Normocephalic and atraumatic.  Right Ear: Tympanic membrane, external ear and ear canal normal.  Left Ear: Tympanic membrane, external ear and ear  canal normal.  Nose: Nose normal.  Mouth/Throat: Uvula is midline, oropharynx  is clear and moist and mucous membranes are normal. No tonsillar exudate.  Eyes: Pupils are equal, round, and reactive to light. Conjunctivae and EOM are normal.  Cardiovascular: Normal rate and regular rhythm.  Pulmonary/Chest: Effort normal and breath sounds normal.  Abdominal: Soft. There is no tenderness. There is no rigidity, no rebound, no guarding and no CVA tenderness.  Did note urinary frequency in clinic today, in presence of forgetfulness   Neurological: She is alert. No cranial nerve deficit.  Oriented to place and time, unable to recall president; after using the restroom multiple times asked again where restroom is; unable to recall events of recent visits she has had or medications she is taking   Skin: Skin is warm and dry.     UC Treatments / Results  Labs (all labs ordered are listed, but only abnormal results are displayed) Labs Reviewed  POCT URINALYSIS DIP (DEVICE) - Abnormal; Notable for the following components:      Result Value   Leukocytes, UA TRACE (*)    All other components within normal limits  URINE CULTURE    EKG None  Radiology No results found.  Procedures Procedures (including critical care time)  Medications Ordered in UC Medications - No data to display  Initial Impression / Assessment and Plan / UC Course  I have reviewed the triage vital signs and the nursing notes.  Pertinent labs & imaging results that were available during my care of the patient were reviewed by me and considered in my medical decision making (see chart for details).     Urine culture pending. This is an ongoing issue for this patient and has been seen multiple times, is following with urology. Full exam in ER just 5 days ago negative. She does not appear toxic however does remain to be confused. Do not feel she is safe to be driving. Called and spoke with first POA who deferred to second  POA. POA [name] is also not local and would be coming but could not be here immediately, asked if she would be able to have Uber to pick up patient. Nicole Hess confirmed patient name and address, patient able to confirm address. POA sara set up Nicole Hess and aware of pick and drop off, aware of follow up. Encouraged to continue to follow up with neurology. May need further home needs due to mental status changes and confusion. Patient and POA sara verbalized understanding.  Final Clinical Impressions(s) / UC Diagnoses   Final diagnoses:  Altered mental status, unspecified altered mental status type  Polyuria     Discharge Instructions     Please continue with Myrbetriq as prescribed by urology.  Continue to follow up with urology as well as neurology.  Urine has been sent for culture, no indications of infection today.  Will notify you of any positive findings and if any changes to treatment are needed.     ED Prescriptions    None     Controlled Substance Prescriptions [place] Controlled Substance Registry consulted? Not Applicable   Zigmund Gottron, NP 05/08/18 2043

## 2018-05-09 ENCOUNTER — Other Ambulatory Visit: Payer: Self-pay

## 2018-05-09 NOTE — Patient Outreach (Signed)
[facility]) Care Management  05/09/2018  Nicole Hess [birth year] [id number]  TELEPHONE SCREENING Referral date: 05/06/18 Referral source:  HTA utilization management Referral reason: confusion regarding medication administration. ED visits,  Per urgent care note: altered mental status.    Call from patients GYN office regarding altered mental status Insurance: Health team advantage  Telephone call to patient regarding regarding health team advantage referral and call from patients GYN office.    HIPAA verified with patient. Patient was able to states name, date of birth, and name of condo development. Patient states she live in the [place].  Patient states he caregiver, Alleen Borne is out of town at a conference.  Patient states Roseanne Reno does not provide direct care for her.  Patient states, "  I run things past her."   Patient reported upcoming scheduled appointments with her doctors. Patient states she has a follow up appointment with her neurologist, Dr. Jannifer Franklin on 05/10/18, follow up with [facility] on 05/11/18 and an appointment with a [facility] practice on 07/01/18 for a new primary MD.  Patient states she does not want to continue to go to her previous primary MD.  Patient would not state name of previous primary care provider.  Patient reports she is having urine issues. Patient states she has to urinate every 15 minutes.  Patient states she saw the urologist who prescribed Myrbetriq for her. Patient states she does not feel this is working well. Patient states she is taking the medication daily as prescribed. She states she has seen her GYN doctor, [name] for this as well. Patient states she has been to the emergency room and urgent care several times within the past 2 weeks for this problem.  Patient states she had heart valve replacement approximately 3 years ago.  She states Dr. Martinique is her cardiologist.  Patient discussed her currently medications. She  states she is taking her medications as prescribed.  Patient reports her doctor has asked her not to drive.  She states she is unsure why.   RNCM discussed and offered [facility] care management services. Patient states she did not see a need for the services at this time.    RNCM returned call to [facility] with Dr. Ammie Ferrier office who stated patient has been a long established patient with [name].  She states patient has had multiple ED visits recently due to her mental status.  She states [name] is concerned about patient and her living alone .  She states their office called 911 on 05/03/18 due to patients altered mental status. Olivia Mackie states she contacted and was able to speak with patients listed designated party release, Alleen Borne on Friday 05/06/18 regarding patients altered mental status.  She states she attempted to contact another one of the patients designated party release Adria Dill but has not heard back.  ASSESSMENT:  Patient was alert to person, place, and time at time of outreach call.  She was able to give medical history information and state her caregiver/ contact person. Patient was able to discuss her medications and upcoming scheduled appointments with her doctors.   PLAN: RNCM will follow up with patient within 3 business days.   Quinn Plowman RN,BSN,CCM [facility] Telephonic  [phone]

## 2018-05-10 ENCOUNTER — Telehealth: Payer: Self-pay | Admitting: Obstetrics & Gynecology

## 2018-05-10 ENCOUNTER — Ambulatory Visit: Payer: PPO | Admitting: Adult Health

## 2018-05-10 LAB — URINE CULTURE: Culture: <10000 — AB

## 2018-05-10 NOTE — Telephone Encounter (Signed)
Patient says the medication she was prescribed is not working.

## 2018-05-11 ENCOUNTER — Other Ambulatory Visit: Payer: Self-pay

## 2018-05-11 NOTE — Telephone Encounter (Signed)
Call to patient and unable to leave voicemail.   Noted Neurology appointment moved to 05/19/18 with Dr. Jannifer Franklin.

## 2018-05-11 NOTE — Telephone Encounter (Signed)
Patient called again and states she is concerned that she cannot go to the store without having to go to the bathroom.   Advised patient that she is not supposed to be driving according to our last conversation. She states she went to [employer] this morning and purchased food but that her family is not aware.   She again states she is voiding about every 3 hours. She is wearing "long pads" in case of urinary leakage. We talked again about her voiding pattern.  We discussed her daily water intake and she explains that she may be drinking 6 bottles of 12 oz of water each day and one cup of coffee. Discussed trying to decrease water intake and talked about thirst symptoms. Encouraged her for good hydration but not to drink too much water as this may be contributing to her frequency.

## 2018-05-11 NOTE — Telephone Encounter (Signed)
Patient returned call. She states she went out this morning and missed my phone call.   States she is voiding q 3 hours. Advised patient that is a normal voiding pattern and that myrbetriq may be helping her, even if she isn't seeing an immediate difference.   Asked patient if she is going to her [facility] appointment today that we discussed last week and she states that her POA has asked her not to drive. She then states that she went to urgent care over the weekend and that "everyone was upset that I was driving and I had to take an [place]."  I advised patient that I agreed with her not driving and that while her independence was limited her providers concerns were only about her safety and we want her to be safe and not drive somewhere and get confused and not be able to get home. She states she agreed to this reasoning.   Appointment at urology was at 1000 today and [facility] to [facility] was given so she could cancel her appointment today since she was not allowed per her POA to drive.   Advised will send message to [name] and return call if any new instructions. Pt verbalizes understanding.

## 2018-05-11 NOTE — Patient Outreach (Signed)
[facility]) Care Management  05/11/2018  DEZAREE TRACEY Jun 02, 1946 [id number]  TELEPHONE SCREENING Referral date: 05/06/18 Referral source:  HTA utilization management Referral reason: confusion regarding medication administration. ED visits,  Per urgent care note: altered mental status.    Call from patients GYN office regarding altered mental status Insurance: Health team advantage Attempt #1   Telephone call to patient regarding referral. Unable to reach patient. HIPAA compliant voice message left with call back phone number.   PLAN: RNCM will attempt 2nd telephone call to patient within 4 business days. RNCM will send outreach letter.   Quinn Plowman RN,BSN,CCM [facility] Telephonic  [phone]

## 2018-05-12 ENCOUNTER — Other Ambulatory Visit: Payer: Self-pay

## 2018-05-12 DIAGNOSIS — Z1231 Encounter for screening mammogram for malignant neoplasm of breast: Secondary | ICD-10-CM | POA: Diagnosis not present

## 2018-05-12 NOTE — Telephone Encounter (Signed)
Nicole Hess calling from [place]. Patient was there today for mammogram and was very disoriented. Came out of dressing room completely undressed except for MMG cape.  Thought she was there for urinary frequency. Got lost in building and was unable to find her car.  [name] has called her and confirmed she made it home.  They wanted to let us know she has talked constantly about the urinary leaking and urgency as well as level of confusion.  Advised of new diagnosis and aware of urinary symptoms.

## 2018-05-12 NOTE — Telephone Encounter (Signed)
Patient is calling stating that she is very confused as to why she had a mammogram done today at her appointment, when she thought she was going to have "something done about all this urine leakage."

## 2018-05-12 NOTE — Telephone Encounter (Addendum)
Return call to patient. She states is driving to Dr. Ammie Ferrier office.  Advised patient that she did not need to come to Dr. Ammie Ferrier office and that she should not be driving. She is reminded that she has a mammogram appointment today, but no appointment with [name].  Patient states she will turn around and go home. Advised patient to be very safe while driving and especially do not answer phone while driving.   Call to emergency contact Adria Dill and left message to return my call. Attempting to coordinate appointments with patient and POA and patient on same day as neurology with [name].

## 2018-05-12 NOTE — Telephone Encounter (Signed)
Call to patient. She states she has a mammogram today and has been calling local taxi's and states they are costly.  Advised patient to call the health team advantage number on her insurance card to see if they can assist with transportation to and from her appointments. Pt is very pleased with this plan.   States her frequency is "still there" reports not worsening. Will review with [name].

## 2018-05-12 NOTE — Telephone Encounter (Signed)
Patient left message over lunch requesting a call back from [place].

## 2018-05-12 NOTE — Telephone Encounter (Signed)
Routing to [name] with update. However, will leave encounter open in case emergency contact Adria Dill returns call.

## 2018-05-12 NOTE — Telephone Encounter (Signed)
Patient left voicemail returning call to [place].

## 2018-05-12 NOTE — Patient Outreach (Addendum)
[place] [facility]) Care Management  05/12/2018  CAMAYA GANNETT 10/28/1945 [id number]  TELEPHONE SCREENING Referral date:05/06/18 Referral source:HTA utilization management Referral reason:confusion regarding medication administration. ED visits, Per urgent care note: altered mental status.  Call from patients GYN office regarding altered mental status Insurance:Health team advantage Attempt #2  Telephone call to patient regarding referral. Unable to reach patient. HIPAA compliant voice message left with call back phone number.   PLAN: RNCM will attempt 3rd telephone call to patient within 4 business days.    Quinn Plowman RN,BSN,CCM [facility] Telephonic  [phone]

## 2018-05-12 NOTE — Telephone Encounter (Signed)
Reviewed with Dr Sabra Heck. Call to to [facility] for non-emergent wellness check and requested assessment of driving ability since patient continues to drive.

## 2018-05-12 NOTE — Telephone Encounter (Signed)
Patient says she missed a call from our office.

## 2018-05-13 ENCOUNTER — Other Ambulatory Visit: Payer: Self-pay | Admitting: Physician Assistant

## 2018-05-13 ENCOUNTER — Other Ambulatory Visit: Payer: Self-pay | Admitting: Neurology

## 2018-05-13 ENCOUNTER — Telehealth: Payer: Self-pay | Admitting: Obstetrics & Gynecology

## 2018-05-13 ENCOUNTER — Other Ambulatory Visit: Payer: Self-pay

## 2018-05-13 ENCOUNTER — Telehealth: Payer: Self-pay | Admitting: *Deleted

## 2018-05-13 MED ORDER — OXYBUTYNIN CHLORIDE 5 MG PO TABS: 2.5000 mg | ORAL_TABLET | Freq: Three times a day (TID) | ORAL | 0 refills | Status: DC

## 2018-05-13 NOTE — Telephone Encounter (Addendum)
Spoke with patient. She is calling to advise that she picked up AZO from [place]. This is a bladder control type called Go-Less. Would like to try to take this. States "Ill try anything to make this stop."  Discussed medication change from [name]. Can stop Mybetriq and tomorrow start Ditropan 2.5 mg TID. Advised patient she should not be driving and patient states that she does get her medications delivered from [facility] and they are bringing her other medications so she would like to try the new medication.   She states she does not want to be home and states everyone is telling me "I can't do this and I can't do that." I advised Ms. Lesure that it is not that we do not want to her to not go anywhere, but we want her to be safe when she does as she is easily confused about location and it is not safe for her to drive anywhere. She verbalizes understanding and states that she knows she is to call her cousin [name] when she goes anywhere.   Patient has follow up appointment with [name] on 05/17/18 and will update [place] on Tuesday how she is doing with the new medication. She was able to repeat back instructions on how to stop Myrbetriq and start Detrol 1/2 tablet po tid.   Routing to [name] and will close encounter.

## 2018-05-13 NOTE — Telephone Encounter (Signed)
Update to Dr Sabra Heck.  Follow-up call to [place] Quinn Plowman.  Update provided regarding incident yesterday at [place] where patient was lost in building, confused regarding where she was and reason for appointment, unaware that she was inappropriately undressed in waiting area ( only in [place]) and then unable to locate car in parking lot.  Following all of this, patient drove herself home. [facility]) contacted our office to report concern regarding this incident.  [place] PD made wellness check and patient was oriented and appropriate so nothing was done. Per Lamar Blinks, patient declined need for [facility] services. She advised me to call APS as soon as possible to protect patient from unintentional harm to self.  Call placed to [facility] APS at 4pm and case opened.   Routing to provider for review. Encounter closed.

## 2018-05-13 NOTE — Telephone Encounter (Signed)
[place] PD called back approximately 640 pm 05-12-18 to report no significantly abnormal behavior during wellness check.   Call to [employer] Quinn Plowman, Left message on confidential voice mail with concerns regarding continued driving with periodic confusion.

## 2018-05-13 NOTE — Patient Outreach (Signed)
[facility]) Care Management  05/13/2018  [name] 11/27/1945 [id number]  Care coordination:   Returned call to Nicole Hess with Dr. Ammie Hess office.  Ms.Yeakly states patient is continually showing signs of altered  Mental status and is still driving after being advised not to. She states patient has been diagnosed with early onset dementia.    Ms. Nicole Hess states patient reportedly had an event at the lab and mammography appointments on yesterday where she became lost within the buildings and was unable to find her car.  She reports patient also walked into the mammography lobby area  Completely undressed with only a mammography cape. Ms. Nicole Hess states patients POA has been called but they have been unsuccessful at reaching her.  RNCM informed Ms. Nicole Hess that patient has been contacted by [facility] care management and offered services.  Patient declined.  RNCM advised Ms. Nicole Hess that APS should be contacted and informed of patients situation.  Ms. Nicole Hess states she would like to know about options to have patient not drive due to concerns for patients safety.  RNCM informed Ms. Nicole Hess she would speak with a [facility] Education officer, museum for advisement.  RNCM contacted Dr. Ammie Hess office and spoke with Nicole Hess.  Nicole Hess that the department of motor vehicles can be contacted and a report made to have patients license placed under administrative review.   RNCM advised Nicole Hess to have [name] discuss patients altered mental status situation with patients neurologist due to upcoming appointment with neurologist next week.    PLAN:  RNCM will attempt 3rd telephone outreach to patient within 4 business days.   Nicole Plowman RN,BSN,CCM [facility] Telephonic  [phone]

## 2018-05-13 NOTE — Telephone Encounter (Signed)
Patient called to speak with the nurse. She bought AZO over the counter to help her with bladder control leakage.

## 2018-05-17 ENCOUNTER — Other Ambulatory Visit: Payer: Self-pay

## 2018-05-17 ENCOUNTER — Ambulatory Visit: Payer: Self-pay | Admitting: Obstetrics & Gynecology

## 2018-05-17 NOTE — Telephone Encounter (Signed)
This encounter was created in error - please disregard.

## 2018-05-17 NOTE — Patient Outreach (Signed)
[facility]) Care Management  05/17/2018  Nicole Hess [birth year] [id number] TELEPHONE SCREENING/ Nurse call line referral Referral date:05/06/18 Referral source:[facility] utilization management Referral reason:confusion regarding medication administration. ED visits, Per urgent care note: altered mental status.  Call from patients GYN office regarding altered mental status Insurance:Health team advantage  Telephone call to patient regarding regarding nurse call line referral.  Unable to reach patient or leave voice message.  Message states, wireless customer is not available.  Attempted call to patients listed emergency contact, Nicole Hess.  HIPAA verified for patient.  Explained reason for call. Nicole Hess states patient is a friend of the family.  She states patient has a cousin, Nicole Hess who she communicates with regarding patient. She states Nicole Hess should be consulted regarding patients care.  Nicole Hess states patient has a tendency to become fixated on things.   She states patient has become fixated on bladder issue.and has been in the urgent care several times for this.  She reports she received 22 emails from patient on yesterday. She states this is an example of how patient becomes fixated on things.   Nicole Hess states patient has a restricted license with a radius of 25-30 miles. She states patient has a upcoming follow up appointment with Dr. Jannifer Franklin the neurologist.  [facility] gave Nicole Hess contact name and phone number.  Requested Nicole Hess give contact information to patient and have her call RNCM.  Nicole Hess verbalized understanding.  RNCM attempted to contact Nicole Hess.  HIPAA compliant message left with call back phone number  ASSESSMENT; Per chart review, appointment with Dr. Jannifer Franklin, neurologist is scheduled for 05/19/18.    PLAN: RNCM will attempt 2nd telephone outreach to patient and/ or Nicole Hess [facility] will send patient [facility] care management  outreach letter.   Nicole Plowman RN,BSN,CCM [facility] Telephonic  [phone]

## 2018-05-17 NOTE — Patient Outreach (Signed)
[facility]) Care Management  05/17/2018  Nicole Hess Nov 22, 1945 [id number]   TELEPHONE SCREENING/ Nurse call line referral Referral date:05/06/18 Referral source:[facility] utilization management Referral reason:confusion regarding medication administration. ED visits, Per urgent care note: altered mental status.  Call from patients GYN office regarding altered mental status Insurance:Health team advantage  Received incoming call from patient. HIPAA verified. Patient states she received [facility] contact information from Ms. Linus Orn.  Patient states she needs transportation assistance for her day to day needs such as getting groceries, hair appointment etc.  Patient states she drives her car to her doctor appointment. Patient states her doctor appointments are close.  RNCM discussed and offered [facility] care management services. Patient verbally agreed to [place] work follow up regarding transportation assistance.   PLAN; RNCM will refer patient to [facility] social worker.   Quinn Plowman RN,BSN,CCM [facility] Telephonic  [phone]

## 2018-05-18 ENCOUNTER — Encounter: Payer: Self-pay | Admitting: Family Medicine

## 2018-05-18 ENCOUNTER — Ambulatory Visit (INDEPENDENT_AMBULATORY_CARE_PROVIDER_SITE_OTHER): Payer: PPO | Admitting: Family Medicine

## 2018-05-18 ENCOUNTER — Other Ambulatory Visit: Payer: Self-pay

## 2018-05-18 VITALS — BP 116/70 | HR 64 | Temp 98.5°F | Ht 68.5 in | Wt 144.0 lb

## 2018-05-18 DIAGNOSIS — Z7689 Persons encountering health services in other specified circumstances: Secondary | ICD-10-CM | POA: Diagnosis not present

## 2018-05-18 DIAGNOSIS — J301 Allergic rhinitis due to pollen: Secondary | ICD-10-CM

## 2018-05-18 DIAGNOSIS — Z23 Encounter for immunization: Secondary | ICD-10-CM | POA: Diagnosis not present

## 2018-05-18 DIAGNOSIS — N3281 Overactive bladder: Secondary | ICD-10-CM

## 2018-05-18 MED ORDER — FLUTICASONE PROPIONATE 50 MCG/ACT NA SUSP: 1.0000 | Freq: Every day | NASAL | 12 refills | Status: DC

## 2018-05-18 NOTE — Progress Notes (Signed)
    Subjective:    Patient ID: Nicole Hess, female    DOB: 06-Jun-1946, 72 y.o.   MRN: [id number]   CC: establish care, urinary frequency  Patient coming in today to establish care. She was being seen prior by [facility] primary care but states "I do not like the way they were doing things".   She is concerned about urinary frequency which "came on suddenly" and has really been bothering her. She reports she has to run to the bathroom every 20 minutes or so but only urinates a small amount. She has been seen several times for this. She does not have any pain or burning with urination. She was referred to urology who started her on myrbetriq but she felt it didn't work so she didn't take it anymore. She then went to see her gynecologist who switched her to oxybutynin. She got this and started it yesterday, took it as directed 3 times and reports "things are better than they were". She has an appointment with her gynecologist Monday 9/9.   She also has history of seizure disorder and follows with neurology. She sees them tomorrow. She denies recent seizures. She was recently diagnosed with dementia and takes aricept. She reports she ran out of this medication recently and wants me to refill it.   Social- no tobacco, never married, used to occasionally drink wine. Lives alone. Contact person is a nurse at the hospital.  Review of Systems- see HPI additionally no changes in weight, no fevers, chills, flank pain, abdominal pain, focal weakness, confusion   Objective:  BP 116/70   Pulse 64   Temp 98.5 F (36.9 C) (Oral)   Ht 5' 8.5" (1.74 m)   Wt 144 lb (65.3 kg)   LMP 09/15/1987   SpO2 98%   BMI 21.58 kg/m  Vitals and nursing note reviewed  General: well nourished, in no acute distress HEENT: normocephalic, MMM. Boggy erythematous nasal turbinates bilaterally Neck: supple, non-tender, without lymphadenopathy Cardiac: RRR, clear S1 and S2, no murmurs, rubs, or gallops Respiratory: clear  to auscultation bilaterally, no increased work of breathing Extremities: no edema or cyanosis.  Skin: warm and dry, no rashes noted Neuro: alert and oriented, no focal deficits   Assessment & Plan:    1. Encounter to establish care Reviewed PMH and medications with patient. She is here alone and drove to this appointment. She seems to have difficulty with short term memory but can relay her medications and PMH well to me. She was recently diagnosed with dementia. I raised some concern with her about living alone and driving which upset her. She has [facility] following. Will continue to address living situation and driving safety at future visits. I plan to discuss her with [facility].   2. Overactive bladder Managed by several specialists, urology, ob-gyn. She has follow up w/ ob-gyn on Monday. Will not make any changes. She has the [place] with her and can relay how to take it without issues to me. She reports some improvement with it.   3. Seasonal allergic rhinitis due to pollen Patient with a lot of nasal congestion in office. Will start flonase for her for symptom control.    4. Need for immunization against influenza Flu shot given today. - Flu Vaccine QUAD 36+ mos IM   Return in about 6 months (around 11/16/2018).   [name], DO Family Medicine Resident PGY-3

## 2018-05-18 NOTE — Patient Instructions (Signed)
  It was nice to meet you today! Please follow up with your neurologist as scheduled tomorrow 9/5. Please follow up with your OB/gyn as schedule on Monday 9/9.  Please let me know if I can be of any help to you. We're happy to see you any time.   If you have questions or concerns please do not hesitate to call at [phone].  [name], DO PGY-3, [facility] 05/18/2018 3:19 PM

## 2018-05-19 ENCOUNTER — Other Ambulatory Visit: Payer: Self-pay | Admitting: Licensed Clinical Social Worker

## 2018-05-19 ENCOUNTER — Encounter: Payer: Self-pay | Admitting: Neurology

## 2018-05-19 ENCOUNTER — Telehealth: Payer: Self-pay

## 2018-05-19 ENCOUNTER — Ambulatory Visit: Payer: Self-pay

## 2018-05-19 ENCOUNTER — Ambulatory Visit (INDEPENDENT_AMBULATORY_CARE_PROVIDER_SITE_OTHER): Payer: PPO | Admitting: Neurology

## 2018-05-19 VITALS — BP 116/66 | HR 70 | Ht 68.0 in | Wt 142.5 lb

## 2018-05-19 DIAGNOSIS — R413 Other amnesia: Secondary | ICD-10-CM

## 2018-05-19 MED ORDER — MEMANTINE HCL 5 MG PO TABS: ORAL_TABLET | ORAL | 0 refills | Status: DC

## 2018-05-19 NOTE — Telephone Encounter (Signed)
Received fax from [facility]. Patient is expecting an antibiotic to be sent in from yesterday's visit for a UTI.  Patient call back is [phone].  Danley Danker, RN [facility] RN)

## 2018-05-19 NOTE — Telephone Encounter (Signed)
Contacted patient and informed her to follow up as scheduled with her GYN on Monday, to address her urinary issues, as they have been following this for a month and we did not check her urine yesterday. Pt was agreeable with plan.

## 2018-05-19 NOTE — Progress Notes (Signed)
Faxed printed/signed rx memantine 5mg  tab to [facility] at [phone]. Received fax confirmation.

## 2018-05-19 NOTE — Telephone Encounter (Signed)
We did not discuss adding an antibiotic for her. I did not make any medication changes for this patient.

## 2018-05-19 NOTE — Progress Notes (Signed)
Reason for visit: Memory disturbance  Nicole Hess is an 72 y.o. female  History of present illness:  Nicole Hess is a 73 year old right-handed white female with a history of a mild memory disturbance.  The patient lives alone, she is able to operate a motor vehicle, she comes in today with a caretaker who indicates that her driving abilities have been well maintained, she is maintaining good safety.  The patient drives short distances to familiar places.  The patient unfortunately has been in the emergency room on several occasions recently, on the 20th, 21st, and 25 of August.  The patient had some problems with confusion, initially she was felt to have a urinary tract infection but this never was confirmed.  The patient underwent a CT scan of the brain that did not show any acute changes.  She remains on low-dose Aricept taking 5 mg at night.  She has been switched off of Myrbetriq to oxybutynin on 30 August.  The patient denies any hallucinations.  She is sleeping well.  The caretaker with her today indicates that she is not acting any differently.  The patient is now being followed through [place].  She will have access to [facility] for transportation.  The patient comes back to this office for an evaluation.  The patient has a history of seizures and is on Mysoline, no recurrent seizures have been noted.  Past Medical History:  Diagnosis Date  . Abnormal Pap smear   . Anemia   . Anxiety   . Aortic stenosis   . Bicuspid aortic valve   . Coronary artery disease   . Depression   . Gout   . Heart murmur   . Heart valve problem    will have follow up Echo-Dr Jordan-will have replacement in a couple of years  . Leukopenia    Seen by Dr. Lamonte Sakai, present since 2008, watchful waiting  . Memory difficulty 04/29/2017  . Osteopenia   . Seizures ([place])    as a child  . Shortness of breath dyspnea   . Vertigo 04/29/2017  . Vertigo     Past Surgical History:  Procedure Laterality Date  .  AORTIC VALVE REPLACEMENT N/A 02/26/2015   Procedure: AORTIC VALVE REPLACEMENT (AVR);  Surgeon: Gaye Pollack, MD;  Location: [place];  Service: Open Heart Surgery;  Laterality: N/A;  . CARDIAC CATHETERIZATION N/A 01/29/2015   Procedure: Right/Left Heart Cath and Coronary Angiography;  Surgeon: Peter M Martinique, MD;  Location: [place] [facility];  Service: Cardiovascular;  Laterality: N/A;  . CERVICAL CONE BIOPSY    . COLONOSCOPY  2010  . DOPPLER ECHOCARDIOGRAPHY  3/14  . GUM SURGERY    . PARTIAL HYSTERECTOMY    . TEE WITHOUT CARDIOVERSION N/A 02/26/2015   Procedure: TRANSESOPHAGEAL ECHOCARDIOGRAM (TEE);  Surgeon: Gaye Pollack, MD;  Location: [place];  Service: Open Heart Surgery;  Laterality: N/A;  . TUBAL LIGATION    . VAGINAL HYSTERECTOMY     for dysplasia    Family History  Problem Relation Age of Onset  . Multiple myeloma Mother   . Stroke Father   . Hypertension Father   . Heart disease Father 27  . Kidney disease Father   . Cancer Father   . Cancer Maternal Grandmother        renal  . Osteoarthritis Brother     Social history:  reports that she has never smoked. She has never used smokeless tobacco. She reports that she drank  alcohol. She reports that she does not use drugs.    Allergies  Allergen Reactions  . Dextromethorphan Other (See Comments)    contraindication  . Pseudoephedrine Other (See Comments)    contriindication  . Penicillins Rash    Has patient had a PCN reaction causing immediate rash, facial/tongue/throat swelling, SOB or lightheadedness with hypotension: No Has patient had a PCN reaction causing severe rash involving mucus membranes or skin necrosis: No Has patient had a PCN reaction that required hospitalization No Has patient had a PCN reaction occurring within the last 10 years: No If all of the above answers are "NO", then may proceed with Cephalosporin use.    Medications:  Prior to Admission medications   Medication Sig Start Date End Date  Taking? Authorizing Provider  Ascorbic Acid (VITAMIN C) 1000 MG tablet Take 1,000 mg by mouth daily.   Yes [provider]  aspirin 81 MG tablet Take 81 mg by mouth daily.   Yes [provider]  Calcium Carbonate-Vitamin D (CALTRATE 600+D PO) Take 1 tablet by mouth 2 (two) times daily.   Yes [provider]  donepezil (ARICEPT) 5 MG tablet TAKE ONE TABLET AT BEDTIME. 04/05/18  Yes Kathrynn Ducking, MD  fluticasone [facility]) 50 MCG/ACT nasal spray Place 1 spray into both nostrils daily. 1 spray in each nostril every day 05/18/18  Yes Riccio, Gardiner Rhyme, DO  Multiple Vitamins-Minerals (MULTIVITAMIN WOMEN) TABS Take 1 tablet by mouth daily. One a day/Women's   Yes [provider]  Omega-3 Fatty Acids (OMEGA-3 FISH OIL PO) Take 1 capsule by mouth 2 (two) times daily.    Yes [provider]  oxybutynin (DITROPAN) 5 MG tablet Take 0.5 tablets (2.5 mg total) by mouth 3 (three) times daily. 05/13/18 06/12/18 Yes Megan Salon, MD  pravastatin (PRAVACHOL) 80 MG tablet Take 1 tablet (80 mg total) by mouth daily. 05/05/17  Yes Brunetta Jeans, PA-C  primidone (MYSOLINE) 250 MG tablet TAKE 1 TABLET BY MOUTH TWICE DAILY. 05/17/18  Yes Kathrynn Ducking, MD  Psyllium (METAMUCIL FIBER PO) Take 1 Dose by mouth daily.   Yes [provider]  Pumpkin Seed-Soy Germ (AZO BLADDER CONTROL/GO-LESS) CAPS Take 1 tablet by mouth daily.   Yes [provider]  memantine (NAMENDA) 5 MG tablet Take 1 tablet daily for one week, then take 1 tablet twice daily for one week, then take 1 tablet in the morning and 2 in the evening for one week, then take 2 tablets twice daily 05/19/18   Kathrynn Ducking, MD    ROS:  Out of a complete 14 system review of symptoms, the patient complains only of the following symptoms, and all other reviewed systems are negative.  Decreased activity Ringing in the ears Eye itching Incontinence of the bladder, urinary urgency Memory  loss Agitation, behavior problem, confusion, decreased concentration  Blood pressure 116/66, pulse 70, height 5' 8"  (1.727 m), weight 142 lb 8 oz (64.6 kg), last menstrual period 09/15/1987.  Physical Exam  General: The patient is alert and cooperative at the time of the examination.  Skin: No significant peripheral edema is noted.   Neurologic Exam  Mental status: The patient is alert and oriented x 3 at the time of the examination. The Mini-Mental status examination done today shows a total score 23/30.   Cranial nerves: Facial symmetry is present. Speech is normal, no aphasia or dysarthria is noted. Extraocular movements are full. Visual fields are full.  Motor: The patient has  good strength in all 4 extremities.  Sensory examination: Soft touch sensation is symmetric on the face, arms, and legs.  Coordination: The patient has good finger-nose-finger and heel-to-shin bilaterally.  Gait and station: The patient has a normal gait. Tandem gait is normal. Romberg is negative. No drift is seen.  Reflexes: Deep tendon reflexes are symmetric.   Assessment/Plan:  1.  Mild memory disturbance  The etiology of the recent problems with confusion are not clear.  The patient has been switched over to oxybutynin which is highly anticholinergic, and may worsen memory and increase confusion.  I would recommended that she go back to the Myrbetriq.  The patient will be placed on low-dose Namenda, they will call if she is able to tolerate the full dose.  The patient will follow-up through this office in 6 months.  I do not wish to increase the Aricept dosing given the history of seizures.  The driving issue need to be scrutinized closely.  Jill Alexanders MD 05/19/2018 1:54 PM  [facility] [address] [place], [place] [address]  Phone [phone] Fax [phone]

## 2018-05-19 NOTE — Patient Outreach (Signed)
[facility]) Care Management  05/19/2018  Nicole Hess [birth year] [id number]  [name] received new referral on patient with the request for a social worker to make a home visit and assess patient's safety and to assist with transportation. [facility] RNCM has received calls from patients GYN doctor's office regarding patient exhibiting behaviors related to New onset dementia.Patient is currently driving to her doctor appointments that she reports are close to her home. Patient is requesting transportation assistance for her daily needs such as for groceries, hair appointment etc.Patient has periods of being lucid and confused. [name] has been concerned about patients safety and unusual behavior. Patient needs face to face to assess needs and discuss transportation assistance. [facility] CSW completed initial outreach call to patient and was able to reach her successfully by phone. HIPPA verifications received. [facility] CSW introduced self, reason for call and of [facility] social work services. Patient is agreeable to social work support at this time as she is interested in gaining SC[facility] services. Patient reports that she has already contacted [employer] herself and they have mailed her a SCAT application but she hasn't had the chance to fill out. [facility] CSW scheduled home visit for tomorrow on 05/20/18. Patient admits that she has had recent episodes of confusion and memory loss but reports overall that she is doing well. [facility] CSW will send involvement letter to PCP at this time and will complete home visit tomorrow.  Nicole Hess, [name], [name], [email or web address] Phone: [phone] Fax: [phone]

## 2018-05-19 NOTE — Patient Instructions (Signed)
Stop oxybutinin and go back on the Myrbetriq for the bladder.  We will start namenda for the memory.

## 2018-05-20 ENCOUNTER — Telehealth: Payer: Self-pay

## 2018-05-20 ENCOUNTER — Other Ambulatory Visit: Payer: Self-pay | Admitting: Licensed Clinical Social Worker

## 2018-05-20 DIAGNOSIS — J301 Allergic rhinitis due to pollen: Secondary | ICD-10-CM | POA: Insufficient documentation

## 2018-05-20 DIAGNOSIS — N3281 Overactive bladder: Secondary | ICD-10-CM | POA: Insufficient documentation

## 2018-05-20 NOTE — Telephone Encounter (Signed)
[name], LCSW with [facility], called PCP regarding patient. Just had home visit with patient today. Please call her back at [phone].  Danley Danker, RN [facility] RN)

## 2018-05-20 NOTE — Patient Outreach (Signed)
[place] [facility]) Care Management  [facility] Social Work  05/20/2018  Nicole Hess Jul 13, 1946 [id number]  Encounter Medications:  Outpatient Encounter Medications as of 05/20/2018  Medication Sig Note  . Ascorbic Acid (VITAMIN C) 1000 MG tablet Take 1,000 mg by mouth daily.   Marland Kitchen aspirin 81 MG tablet Take 81 mg by mouth daily.   . Calcium Carbonate-Vitamin D (CALTRATE 600+D PO) Take 1 tablet by mouth 2 (two) times daily.   Marland Kitchen donepezil (ARICEPT) 5 MG tablet TAKE ONE TABLET AT BEDTIME.   . fluticasone (FLONASE) 50 MCG/ACT nasal spray Place 1 spray into both nostrils daily. 1 spray in each nostril every day   . memantine (NAMENDA) 5 MG tablet Take 1 tablet daily for one week, then take 1 tablet twice daily for one week, then take 1 tablet in the morning and 2 in the evening for one week, then take 2 tablets twice daily 05/19/2018: Faxed printed/signed rx memantine 5mg  tab to [facility] at [phone]. Received fax confirmation.    . Multiple Vitamins-Minerals (MULTIVITAMIN WOMEN) TABS Take 1 tablet by mouth daily. One a day/Women's   . Omega-3 Fatty Acids (OMEGA-3 FISH OIL PO) Take 1 capsule by mouth 2 (two) times daily.    Marland Kitchen oxybutynin (DITROPAN) 5 MG tablet Take 0.5 tablets (2.5 mg total) by mouth 3 (three) times daily.   . pravastatin (PRAVACHOL) 80 MG tablet Take 1 tablet (80 mg total) by mouth daily.   . primidone (MYSOLINE) 250 MG tablet TAKE 1 TABLET BY MOUTH TWICE DAILY.   Marland Kitchen Psyllium (METAMUCIL FIBER PO) Take 1 Dose by mouth daily.   . Pumpkin Seed-Soy Germ (AZO BLADDER CONTROL/GO-LESS) CAPS Take 1 tablet by mouth daily.    No facility-administered encounter medications on file as of 05/20/2018.     Functional Status:  In your present state of health, do you have any difficulty performing the following activities: 05/20/2018  Hearing? N  Vision? Y  Difficulty concentrating or making decisions? Y  Walking or climbing stairs? N  Dressing or bathing? N  Doing  errands, shopping? Y  Some recent data might be hidden    Fall/Depression Screening:  PHQ 2/9 Scores 05/20/2018 05/19/2018 05/18/2018 05/17/2018 05/09/2018 01/08/2017 11/06/2016  PHQ - 2 Score 0 0 0 0 0 0 0  PHQ- 9 Score - - - - - 0 -    Assessment: [facility] CSW arrived at patient's residence to complete initial home visit. Patient answered door and stated that she thought appointment was at a different time than scheduled even though patient contacted [facility] CSW several times yesterday to confirm appointment date and time. Patient successfully provided HIPPA verifications. Patient agreeable to [place] at this time. Patient lives alone in her residence at [facility]. Patient has a 24 hour help desk downstairs on the first level and patient reports being familiar with front desk workers. Patient reports this being her only support in the home and denies having a private pay caregiver. Patient shares that she had a neurologist appointment with Dr. Jannifer Franklin yesterday and she went with her caretaker [name]. Dr Jannifer Franklin informed her that she is still able to drive and operate a motor vehicle but encouraged that this should be monitored closely. Patient reports only driving short distances and only to familiar places as she is aware that she can get lost and confused in unfamiliar areas. Per Dr. Tobey Grim note, "Patient unfortunately has been in the emergency room on several occasions recently, on the 20th, 21st, and  78 of August. The patient had some problems with confusion, initially she was felt to have a urinary tract infection but this never was confirmed. The patient underwent a CT scan of the brain that did not show any acute changes. She remains on low-dose Aricept taking 5 mg at night. She has been switched off of Myrbetriq to oxybutynin on 30 August. The patient denies any hallucinations. She is sleeping well. The caretaker with her today indicates that she is not acting any differently. She will have access to  [employer] for transportation. The patient comes back to this office for an evaluation. The etiology of the recent problems with confusion are not clear. The patient has been switched over to oxybutynin which is highly anticholinergic, and may worsen memory and increase confusion. I would recommended that she go back to the Myrbetriq. The patient will be placed on low-dose Namenda, they will call if she is able to tolerate the full dose. The patient will follow-up through this office in 6 months. I do not wish to increase the Aricept dosing given the history of seizures. The driving issue need to be scrutinized closely." Patient reports that she is interested in gaining SCAT services so that she can use this service instead of driving herself whenever needed. Patient was educated on transportation resources and patient has chosen [facility]. [facility] CSW completed entire SCAT application and successfully faxed it to SC[facility] office on 05/20/18. Patient was educated on the eligibility process for SCAT services and understands that she will receive a phone call from [employer] to schedule an in person interview once application has been reviewed. Patient does exhibit periods of memory loss and confusion during session as she repeated questions and had to be prompted several times to sign consent form. Patient shares that she does not want LTC placement and wishes to reside where she is at now. Patient reports being retired for over 3 years now.  Patient has been established with a new PCP, Dr. [name] and saw her for the an initial visit on 05/18/18. Per PCP note, "She seems to have difficulty with short term memory but can relay her medications and PMH well to me." Patient reports filing her own medications. Patient became tearful/upset when [facility] CSW questioned the safety of her living alone given her recent diagnosis of early onset dementia. Patient reports that she was not aware she had this but can admit she has experienced some memory  loss recently. Patient reports no interest in hiring an aide or to relocate to a higher level of care or an independent living community. Patient reports only being agreeable to receiving transportation assistance at this time but is appreciative of education and community resources. [facility] CSW provided education on available memory care resources within the local community such as: [facility] Adult day services and Group respite which are for adults 71 and older that have a diagnosis of dementia. Patient was provided an handout with these resources which also include the [place] which helps to support an adult's personal independence and promote social, physical and emotional well-being.  Patient reports that socialization is important to her but she does not feel that she is in need of these services at this time. Patient agreeable to next outreach follow up within two weeks.  [facility] CSW received in basket message from [place] that stated that patient had contacted her today and left a voice message about a question regarding something that was discussed  during home visit today. [facility] CSW completed call to patient and patient answered. Patient reports that she wanted to make sure that she signed everything she needed to in order to complete the SCAT application as well as the [place] Consent Form. Patient was informed that she has already filled out all of the required signature sections. Patient appreciative of call back.   [facility] CSW received voice message from patient's PCP, Dr. [name] on 05/20/18, who wishes to speak to [facility] CSW in regards to patient's memory loss and safety concerns within the home as she lives alone. [facility] CSW completed call back to doctor's office and left a message for PCP, requesting a return call in order to discuss patient's social work needs.  [facility] CM Care Plan Problem One     Most Recent Value  Care Plan Problem One  Lack  of support within the home and lack of community resource knowledge  Role Documenting the Problem One  Clinical Social Worker  Care Plan for Problem One  Active  [facility] Long Term Goal   PT will gain stable transportation with SCAT services within 90 days as patient's physician wishes for patient to discontinue driving due to memory loss and confusion  [facility] Long Term Goal Start Date  05/20/18  Interventions for Problem One Long Term Goal  [facility] CSW successfully completed home visit and provided education on available transportation resources. [facility] CSW completed SCAT application and successfully faxed documen to SCAT office on 05/20/18. [facility] CSW monitor process very closely.     Plan: [facility] CSW will route encounter to PCP at this time. [facility] CSW has faxed completed SCAT application to SCAT office. [facility] CSW will follow up with patient within two weeks and will await to hear back from PCP.  [name], [name], [name], [email or web address] Phone: [phone] Fax: [phone]

## 2018-05-23 ENCOUNTER — Encounter: Payer: Self-pay | Admitting: Obstetrics & Gynecology

## 2018-05-23 ENCOUNTER — Other Ambulatory Visit: Payer: Self-pay | Admitting: Licensed Clinical Social Worker

## 2018-05-23 ENCOUNTER — Telehealth: Payer: Self-pay | Admitting: Obstetrics & Gynecology

## 2018-05-23 ENCOUNTER — Ambulatory Visit (INDEPENDENT_AMBULATORY_CARE_PROVIDER_SITE_OTHER): Payer: PPO | Admitting: Obstetrics & Gynecology

## 2018-05-23 ENCOUNTER — Other Ambulatory Visit: Payer: Self-pay

## 2018-05-23 VITALS — BP 102/56 | HR 84 | Resp 18 | Ht 68.0 in | Wt 144.2 lb

## 2018-05-23 DIAGNOSIS — R3915 Urgency of urination: Secondary | ICD-10-CM | POA: Diagnosis not present

## 2018-05-23 LAB — POCT URINALYSIS DIPSTICK
Bilirubin, UA: NEGATIVE
Blood, UA: NEGATIVE
Glucose, UA: NEGATIVE
Ketones, UA: NEGATIVE
Leukocytes, UA: NEGATIVE
Nitrite, UA: NEGATIVE
PH UA: 7 (ref 5.0–8.0)
Protein, UA: NEGATIVE
UROBILINOGEN UA: 0.2 U/dL

## 2018-05-23 NOTE — Patient Outreach (Addendum)
[facility]) Care Management  05/23/2018  Nicole Hess 1945-11-12 [id number]  [facility] CSW received incoming return call from patient's PCP Dr. Vanetta Shawl and coordinated care as well as discussed patient's safety concerns with her staying in the community and living alone. [facility] CSW informed PCP that she will file an APS report today and will include her contact information to the APS report as well in order to be contacted for additional information. [facility] CSW also added patient's OBGYN [name], patient's neurologist Dr. Jannifer Franklin and patient's family to report. [facility] CSW successfully filed APS report on patient today on 05/23/18.  [name], [name], [name], [email or web address] Phone: [phone] Fax: [phone]

## 2018-05-23 NOTE — Progress Notes (Signed)
GYNECOLOGY  VISIT  CC:   Urinary urgency  HPI: 72 y.o. G0P0000 Single Caucasian female here for continued urinary urgency.  Has seen seen in the office several times for this.  Urine culture has been negative.  She has been to the ER twice for this as well without any evidence of UTI.  Desires urine culture again.  Will repeat this for pt but reassured her at this point, there is no evidence to suggest that she has a UTI.    D/w pt likely OAB is the cause of her symptoms.  She has been on Myrbetriq without any improvement in symptoms and maybe worsening symptoms because she was also leaking urine while on this.  She was switched to oxybutynin 2.39m up to TID.  Leaking has stopped.  She is still having urinary frequency, however.  I was aware of CNS side effects but felt a change was needed due to the number of phone calls and visits for the same issue in the past month.  She has experienced a change in the last month (from my perspsective) in regards to her cognitive function.  She, at times, has not been able to tell me her date of birth or the actual date.  She always knows the day of the week.  She is able to voice that she does get confused some.  She did see Dr. WJannifer Franklinlast week due to her continue memory issues.  She was advised her to stop the oxybutynin.  Nemenda was also added.  Aricept, 55mdaily, was continued.  Ms. RuBarbaas a bag of medications with her today.  There are several bottles of multivitamins as well as three bottles of aspirin.  She has three ziploc bag containing bottle of medications.  She states this is all she taking that are prescriptions.  One bag contains an open bottle of oxybutynin.  She states she took one this morning.  A second bag contains an open bottle of pravastatin with two different shaped pills.  I looked these up and both are genetics of pravastatin--one is round and one is oval shaped.  This bag also has an empty bottle of Primidone in it.  The last bag has  an open bottle of primidone in it as well as several of the oval shaped pravastatin in it.  She states she thought the oval shaped pills were the primidone so she put them into that bag.  She cannot tell me exactly what pills she took this morning.  She does state she has 3 pill packs at home but gets confused about what pack she needs to take from each day.  She also states that she is trying hard to get this right because person helping her "will be mad at me".      Ms. RuHittles aware she may lose her driver's license.  She did drive here today.  [facility]  [facility] She states the last time she indicates any pharmacy to pick up prescriptions, the bag that should have gained her prescriptions had an apple and it.  She states she went back indicates that he pharmacy and there was some sort of confrontation about her prescriptions.  She wants to change her pharmacy to something else very close to her home.  She states she is not sure how to do this.  Also, I asked her why the medication bottles were open and pills  loose in a Ziploc bag.  She admits she cannot get the safety cap off the top of the medication bottle.    I reorganize her medications for her today.  I asked if I could dispose of the oxybutynin.  She does not have her Aricept or the Namenda today.  The instructions for the Namenda are to take 1 tablet daily for 1 week, then increase to 1 tablet twice daily for 1 week, then 1 tablet in the morning and 2 in the evening for 1 week and then 2 tablets twice daily.  This is written down her medication sheet and I reviewed this with her.  She states this is too confusing for her and that she does not think she can do this correctly.  I advised her I will contact [facility] as I think she needs a pharmacy consultation and for her medications to be dosed in packets that she would take in the morning in the evening so all she needs to his open  a packet of medication.  I also advised her I could help her transfer all her [facility] a local [facility] and we did this while she was in my office.  She is confused about [facility] transportation.  I believe this has been initiated but she is unsure.  I will follow-up on this and communicate back with her once I know where she is in this process.  GYNECOLOGIC HISTORY: Patient's last menstrual period was 09/15/1987. Contraception: hysterectomy  Menopausal hormone therapy: none  Patient Active Problem List   Diagnosis Date Noted  . Overactive bladder 05/20/2018  . Seasonal allergic rhinitis due to pollen 05/20/2018  . Vertigo 04/29/2017  . Memory difficulty 04/29/2017  . Dementia without behavioral disturbance 02/06/2017  . 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 History of colonic polyps 11/09/2016  . S/P AVR (aortic valve replacement) 04/02/2015  . Aortic stenosis due to bicuspid aortic valve 02/26/2015  . Severe aortic stenosis 05/02/2014  . Precordial pain 01/26/2013  . Depression 03/30/2012  . Hyperlipidemia 03/30/2012    Past Medical History:  Diagnosis Date  . Abnormal Pap smear   . Anemia   . Anxiety   . Aortic stenosis   . Bicuspid aortic valve   . Coronary artery disease   . Depression   . Gout   . Heart murmur   . Heart valve problem    will have follow up Echo-Dr Jordan-will have replacement in a couple of years  . Leukopenia    Seen by Dr. Lamonte Sakai, present since 2008, watchful waiting  . Memory difficulty 04/29/2017  . Osteopenia   . Seizures ([place])    as a child  . Shortness of breath dyspnea   . Vertigo 04/29/2017  . Vertigo     Past Surgical History:  Procedure Laterality Date  . AORTIC VALVE REPLACEMENT N/A 02/26/2015   Procedure: AORTIC VALVE REPLACEMENT (AVR);  Surgeon: Gaye Pollack, MD;  Location: [facility];  Service: Open Heart Surgery;  Laterality: N/A;  . CARDIAC CATHETERIZATION N/A  01/29/2015   Procedure: Right/Left Heart Cath and Coronary Angiography;  Surgeon: Peter M Martinique, MD;  Location: [facility];  Service: Cardiovascular;  Laterality: N/A;  . CERVICAL CONE BIOPSY    . COLONOSCOPY  2010  . DOPPLER ECHOCARDIOGRAPHY  3/14  . GUM SURGERY    . PARTIAL HYSTERECTOMY    . TEE WITHOUT CARDIOVERSION N/A 02/26/2015   Procedure: TRANSESOPHAGEAL ECHOCARDIOGRAM (TEE);  Surgeon: Gaye Pollack, MD;  Location: [facility];  Service: Open Heart Surgery;  Laterality: N/A;  . TUBAL LIGATION    . VAGINAL HYSTERECTOMY     for dysplasia    MEDS:   Current Outpatient Medications on File Prior to Visit  Medication Sig Dispense Refill  . aspirin 81 MG tablet Take 81 mg by mouth daily.    . Calcium Carbonate-Vitamin D (CALTRATE 600+D PO) Take 1 tablet by mouth 2 (two) times daily.    . Multiple Vitamins-Minerals (MULTIVITAMIN WOMEN) TABS Take 1 tablet by mouth daily. One a day/Women's    . Omega-3 Fatty Acids (OMEGA-3 FISH OIL PO) Take 1 capsule by mouth 2 (two) times daily.     . phenazopyridine (PYRIDIUM) 95 MG tablet Take 95 mg by mouth 3 (three) times daily as needed for pain.    . pravastatin (PRAVACHOL) 80 MG tablet Take 1 tablet (80 mg total) by mouth daily. 30 tablet 0  . primidone (MYSOLINE) 250 MG tablet TAKE 1 TABLET BY MOUTH TWICE DAILY. 60 tablet 1  . Psyllium (METAMUCIL FIBER PO) Take 1 Dose by mouth daily.    . Pumpkin Seed-Soy Germ (AZO BLADDER CONTROL/GO-LESS) CAPS Take 1 tablet by mouth daily.    . Ascorbic Acid (VITAMIN C) 1000 MG tablet Take 1,000 mg by mouth daily.    Marland Kitchen donepezil (ARICEPT) 5 MG tablet TAKE ONE TABLET AT BEDTIME. (Patient not taking: Reported on 05/23/2018) 30 tablet 5  . fluticasone (FLONASE) 50 MCG/ACT nasal spray Place 1 spray into both nostrils daily. 1 spray in each nostril every day (Patient not taking: Reported on 05/23/2018) 16 g 12  . memantine (NAMENDA) 5 MG tablet Take 1 tablet daily for one week, then take 1 tablet twice daily for one  week, then take 1 tablet in the morning and 2 in the evening for one week, then take 2 tablets twice daily (Patient not taking: Reported on 05/23/2018) 70 tablet 0  . oxybutynin (DITROPAN) 5 MG tablet Take 0.5 tablets (2.5 mg total) by mouth 3 (three) times daily. (Patient not taking: Reported on 05/23/2018) 45 tablet 0   No current facility-administered medications on file prior to visit.     ALLERGIES: Dextromethorphan; Pseudoephedrine; and Penicillins  Family History  Problem Relation Age of Onset  . Multiple myeloma Mother   . Stroke Father   . Hypertension Father   . Heart disease Father 42  . Kidney disease Father   . Cancer Father   . Cancer Maternal Grandmother        renal  . Osteoarthritis Brother     SH:  Single, non smoker  Review of Systems  Genitourinary: Positive for frequency.  All other systems reviewed and are negative.   PHYSICAL EXAMINATION:    BP (!) 102/56 (BP Location: Right Arm, Patient Position: Sitting, Cuff Size: Large)   Pulse 84   Resp 18   Ht 5' 8"  (1.727 m)   Wt 144 lb 3.2 oz (65.4 kg)   LMP 09/15/1987   BMI 21.93 kg/m     General appearance: alert, cooperative and appears stated age No exam performed today.  Assessment: Urinary urgency, likely OAB, possible related to her Aricept as this is a known side effect for this medication.   Confusion    Plan: Urinary leakage improved with Oxybutynin but based on Dr. Jannifer Franklin' recommendations, advised her to stop this today.  She asked me to discard of this medication so I did. Will communicate with [facility] social  work about my concerns today with incorrect medications and mixed up medications in a bag as well as several bottles of aspirin that she is taking.   Urine culture repeated.  Suspect this will be negative.   ~30 minutes spent with patient >50% of time was in face to face discussion of above.  Additional note:  Spoke with Jettie Booze, LCSW, for [facility].  Advised of interaction with Ms. Bonfiglio  and concerns for medication confusion and risks to self.  Also, concerned about confusion and driving as well as safety to self and others.  APS has been contacted and is going to do an evaluation.  Also, pharmacy consultation through [facility] will be done.    Lastly, Ms. Runnion called back one day later as she was unsure about what happened with her prescriptions.  She was reminded that prescriptions were changed to [place] by my CMA, Lowell Bouton, and pt was in the office when this was done as she needed to confirm that she indeed did want all prescriptions transferred.

## 2018-05-23 NOTE — Progress Notes (Signed)
[employer] on [address], spoke with Jenny Reichmann. They will request transfer of her medications from [facility], Pravastatin, Primidone and Aricept.  Informed patient has some difficulty with memory and she needs consistency on shapes and colors for generic medicines and no safety caps so she can use her medicine bottles effectively.

## 2018-05-23 NOTE — Telephone Encounter (Signed)
Left message on voicemail over the weekend to let Dr Sabra Heck know the medication is working. Confirmed with patient her appointment this afternoon at 3:15.

## 2018-05-23 NOTE — Telephone Encounter (Signed)
Returned call to [employer] w/ [facility] about Ms. Nicole Hess. We agreed we're worried about her safety as she lives alone, drives herself to appointments, and seems to have severe memory deficits. Ultimately we decided to go ahead with opening APS case. [name] will initiate this and keep me informed. Will continue to follow along for any additional needs for the patient.

## 2018-05-23 NOTE — Telephone Encounter (Signed)
Noted message from patient. Has appointment today. Will close encounter.

## 2018-05-24 ENCOUNTER — Ambulatory Visit: Payer: PPO | Admitting: Licensed Clinical Social Worker

## 2018-05-24 ENCOUNTER — Encounter: Payer: Self-pay | Admitting: Obstetrics & Gynecology

## 2018-05-24 ENCOUNTER — Telehealth: Payer: Self-pay | Admitting: Obstetrics & Gynecology

## 2018-05-24 ENCOUNTER — Other Ambulatory Visit: Payer: Self-pay | Admitting: Licensed Clinical Social Worker

## 2018-05-24 ENCOUNTER — Telehealth: Payer: Self-pay | Admitting: *Deleted

## 2018-05-24 NOTE — Telephone Encounter (Signed)
Patient calling with concerns about still having bladder leakage. Last seen yesterday 05/23/18.

## 2018-05-24 NOTE — Telephone Encounter (Signed)
Call from [place] with  Adult Protective Services.  She is calling to follow-up on report previously placed.  Patient does not meet criteria. Has no current allegation and although has memory issues, has no clear disability to cause harm.  Advised Brandy that we called [facility] again yesterday after patients visit here. Patient clearly has good and bad periods but we are concerned for risk to self or others.  Reviewed events that occurred at [place] on 05-10-18 with patient's clear confusion followed by her proceeding to drive herself home. ( See phone note from 05-10-18)  Reviewed that she arrived in office yesterday  Came in am for pm appointment) with baggies of medications, some opened and mixed together. Dr Sabra Heck sorted these and attempted to clarify instructions.  At one point patient reported she was not able to follow the regimen that involved the incremental dosing. ( too complicated for patient to follow). Dr Sabra Heck identified that patient has issues opening lids and we requested pharmacy discontinue child proof caps.  Call has been placed to [facility] requesting pharmacy consult. Patient is at risk for medication error, under or over dosing different medications, including over the counter vitamins.  Also advised that patient may have periods of less confusion but she also has periods of increased confsuion and is driving a car during this time.  Advised Brandy Dr Sabra Heck will speak directly with her to review her own physical findings.  Theadora Rama will re-open case and call back to discuss with Dr Sabra Heck this afternoon ( Dr Sabra Heck in surgery. )   Routing to Dr Sabra Heck for review.

## 2018-05-24 NOTE — Telephone Encounter (Signed)
Called pt because we received notice from [facility] that she wanted to know what other tablet she needed to take with aricept. I called pt and advised [facility]. She verified she picked this up. I went over instructions to taper up to maintenance dose. Asked her to call when she is almost done with bottle and we will call in new rx for her. Nothing further needed.

## 2018-05-24 NOTE — Telephone Encounter (Signed)
Of note this is patient's third call to the office today.   Returned call to [phone] and no answer. Wireless customer not available per voicemail.
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
## 2018-05-24 NOTE — Telephone Encounter (Signed)
Patient says she need a refill on a medication for urine frequency.
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
## 2018-05-24 NOTE — Telephone Encounter (Signed)
Patient called requesting to confirm her medication changes from yesterday.

## 2018-05-24 NOTE — Telephone Encounter (Signed)
Faxed notice to [facility] that we resolved pt questions and she picked up rx memantine already from [facility]. Nothing further needed.
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
## 2018-05-24 NOTE — Telephone Encounter (Signed)
Patient called to ask if our office sent any prescriptions to [facility] yesterday. Advised yes, she was assisted in transferring her prescriptions to [facility] yesterday. She does not recall events from yesterday where she requested her prescriptions be transferred all to [facility]. Advised patient to return call with any further questions and she is agreeable. States she is glad to have all prescriptions at [facility] now. Reviewed with RN Lamont Snowball as patient has known memory issues. Will ask [name] to review note.  Encounter to [name] and will close.

## 2018-05-25 ENCOUNTER — Telehealth: Payer: Self-pay | Admitting: *Deleted

## 2018-05-25 ENCOUNTER — Ambulatory Visit: Payer: Self-pay | Admitting: Pharmacist

## 2018-05-25 ENCOUNTER — Other Ambulatory Visit: Payer: Self-pay | Admitting: Pharmacist

## 2018-05-25 ENCOUNTER — Telehealth: Payer: Self-pay | Admitting: Neurology

## 2018-05-25 LAB — URINE CULTURE: Organism ID, Bacteria: NO GROWTH

## 2018-05-25 NOTE — Telephone Encounter (Signed)
Pt has called stating that after picking up her memantine (NAMENDA) 5 MG tablet and donepezil (ARICEPT) 5 MG tablet  She is looking at both sets of pills which look exactly alike and this has her upset and concerned.  Pt is asking for a call back from RN Terrence Dupont to discuss

## 2018-05-25 NOTE — Telephone Encounter (Signed)
Jonathan/[facility] [phone] the patient thinks she has rx for phenobarbital but he said no one is able to locate it. He needs to know if she has one thru Dr Jannifer Franklin. Please call to advise

## 2018-05-25 NOTE — Telephone Encounter (Signed)
Pt called back said she wanted to talk with RN about a medication she thought she was getting. I was on the phone with the pt for about 7 minutes before she realized the medication she was talking about is primidone.  Please call to touch base with her for verification. She continues to talk about a white pill, a blue pill and Dr Jannifer Franklin wanted to add another pill to boost something or she needed a pill. Pt said someone does her pill boxes for her. I asked her if she could call her and have her come check her pill box today, she said she would.

## 2018-05-25 NOTE — Telephone Encounter (Signed)
I tried to call the patient.  Unable to leave a message.  The patient is on Mysoline, not phenobarbital, Mysoline breaks down into phenobarbital.  The patient was just recently given a prescription for Mysoline.

## 2018-05-25 NOTE — Telephone Encounter (Signed)
Dr. Jannifer Franklin- can you check behind me? I reviewed her chart but I do not see where you have ever prescribed phenobarbital for her. Thanks

## 2018-05-25 NOTE — Patient Outreach (Signed)
[place] [facility]) Care Management  05/25/2018  Nicole Hess [birth year] [id number]   72 year old female referred to Nicole Hess via Nicole Hess for medication management.  PMH significant for: HLD, early onset dementia, overactive bladder, depression.    Per patient's care team in Nicole Hess, patient is at risk for medication error, under or over dosing different medications, including over the counter vitamins. Per MD office notes, "patient came in with baggies of medications, some opened and mixed together.  Nicole Hess sorted these and attempted to clarify instructions.  At one point patient reported she was not able to follow the regimen that involved the incremental dosing. (too complicated Nicole Hess identified that patient has issues opening lids and we requested pharmacy discontinue child proof caps.    Successful repeat call to Nicole Hess with HIPAA identifiers verified. Patient has visitor earlier and was unable to talk.   Due to cognitive status, did not review all medications telephonically.  Patient was hesitant at first but then agreed to home visit tomorrow @11am .  PLAN: -[place] home visit scheduled for tomorrow @11am  to determine medication management strategies.  Nicole Hess, PharmD, [place]  [phone]

## 2018-05-25 NOTE — Telephone Encounter (Signed)
Please refer to prior telephone note from today.

## 2018-05-25 NOTE — Telephone Encounter (Signed)
Call from [facility], at [facility]. Referral received and requested basic information. Will fax request for clinical information. Provided phone number for [facility] case manager Jettie Booze.   Routing to Dr Sabra Heck for update.

## 2018-05-25 NOTE — Telephone Encounter (Signed)
I called pt. I explained I reached out to [facility]) and she is going to get in touch with her to go over her medications and makes sure everything is ok. I asked she not mess with her medications until she can go through it with [facility] since she is the one to set up her medications with her. She verbalized understanding and appreciation.

## 2018-05-25 NOTE — Telephone Encounter (Signed)
Events noted

## 2018-05-25 NOTE — Telephone Encounter (Signed)
May need to contact [facility] on [facility] to clear up confusion about her medications. Pt reported to [place] that she helps manage her medications.

## 2018-05-25 NOTE — Telephone Encounter (Signed)
Dr. Jannifer Franklin- I wanted you to be aware of what was going on. I called Marcena at [phone]. Verified she is one of pt POA and she helps manage her medications. She states she was just there and got her pillbox ready through 06/04/18. She explained to pt that she should not mess with her medications and no need to call the pharmacy for refills. She should wait until medications are delivered to her home. Roseanne Reno thinks her memory has worsened and she is getting more confused about taking her medications even though she labels each one with dates. They have explored [facility]/social worker to see what their options are for her. They are thinking about possible assisted living to better manage her and her medications. She does not have immediate family to help out. I asked that she check on her and make sure her medications are still ok and no mix up occurred. She will do this. Advised I will send message to Dr. Jannifer Franklin.

## 2018-05-26 ENCOUNTER — Other Ambulatory Visit: Payer: Self-pay | Admitting: Licensed Clinical Social Worker

## 2018-05-26 ENCOUNTER — Other Ambulatory Visit: Payer: Self-pay | Admitting: Pharmacist

## 2018-05-26 ENCOUNTER — Ambulatory Visit: Payer: Self-pay | Admitting: Pharmacist

## 2018-05-26 NOTE — Telephone Encounter (Signed)
Records request reviewed by Dr Sabra Heck. Medical records faxed.   Marland KitchenRouting to provider. Will close encounter.

## 2018-05-26 NOTE — Patient Outreach (Signed)
[place] [facility]) Care Management  05/26/2018  Nicole Hess 1946/09/02 [id number]   [place] home visit performed today with HIPAA identifiers verified.  Per chart, patient has been having memory loss issues.  Patient continued to ask who I was during the visit.  After reviewing her medications, she remains confused about them.  She states her caregiver Nicole Hess comes to fill her pill box for her.  [facility] Pharmacist checked them thoroughly and they were appropriately filled.  Patient remains very confused about colors of medications.  Patient continues to speak about her PMH and how she has had issues since a child with her bladder.  Medications have been reviewed per patient's list and pillboxes.  All are in order.  Namenda has been recently added to the Aricept.  Patient is frustrated and confused that her new pharmacy [facility] has a different generic brand of Aricept and it is a different color.  [facility] Pharmacist ensure patient that it was the same medication and that [facility] will sometimes get different brands in to keep costs lower.  During this visit, [facility] Pharmacist is truly concerned for patient's well being and her ability to care for herself.  Discussed patient safety concerns with Nicole Hess and representative from APS  Medications Reviewed Today    Reviewed by Nicole Hess, [facility] (Pharmacist) on 05/26/18 at 2200  Med List Status: <None>  Medication Order Taking? Sig Documenting Provider Last Dose Status Informant  aspirin 81 MG tablet [id number] Yes Take 81 mg by mouth daily. [provider] Taking Active Self  Calcium Carbonate-Vitamin D (CALTRATE 600+D PO) [id number] Yes Take 1 tablet by mouth 2 (two) times daily. [provider] Taking Active Self  donepezil (ARICEPT) 5 MG tablet [id number] Yes TAKE ONE TABLET AT BEDTIME. Kathrynn Ducking, MD Taking Active Self  memantine [facility]) 5 MG tablet [id number] Yes Take 1 tablet daily for one  week, then take 1 tablet twice daily for one week, then take 1 tablet in the morning and 2 in the evening for one week, then take 2 tablets twice daily Kathrynn Ducking, MD Taking Active            Med Note Nicole Hess, Nicole Hess   Thu May 19, 2018  3:17 PM) Faxed printed/signed rx memantine 5mg  tab to [employer] at [phone]. Received fax confirmation.    Multiple Vitamins-Minerals (MULTIVITAMIN WOMEN) TABS [id number] Yes Take 1 tablet by mouth daily. One a day/Women's [provider] Taking Active Self  Omega-3 Fatty Acids (OMEGA-3 FISH OIL PO) [id number] Yes Take 1 capsule by mouth 2 (two) times daily.  [provider] Taking Active Self  phenazopyridine (PYRIDIUM) 95 MG tablet [id number] Yes Take 95 mg by mouth 3 (three) times daily as needed for pain. [provider] Taking Active   pravastatin (PRAVACHOL) 80 MG tablet [id number] Yes Take 1 tablet (80 mg total) by mouth daily. Nicole Jeans, PA-C Taking Active Self  primidone (MYSOLINE) 250 MG tablet [id number] Yes TAKE 1 TABLET BY MOUTH TWICE DAILY. Kathrynn Ducking, MD Taking Active   Psyllium [facility] FIBER PO) [id number] Yes Take 1 Dose by mouth daily. [provider] Taking Active Self  Pumpkin Seed-Soy Germ (AZO BLADDER CONTROL/GO-LESS) CAPS [id number] Yes Take 1 tablet by mouth daily. [provider] Taking Active           PLAN: -Will attempt to find blue generic donepezil -Will f/u with patient within 2 wks.  Nicole Hess, PharmD, BCPS  [place]  [phone]

## 2018-05-26 NOTE — Patient Outreach (Signed)
[place] [facility]) Care Management  05/26/2018  Nicole Hess 05-Sep-1946 [id number]  [facility] CSW received missed call and voice message from [name]. [facility] CSW completed return call back but was unable to reach her successfully. Voice message left encouraging a return call in order to provide additional information and coordinate care.  [name], [name], [name], [email or web address] Phone: [phone] Fax: [phone]

## 2018-05-30 ENCOUNTER — Other Ambulatory Visit: Payer: Self-pay | Admitting: Licensed Clinical Social Worker

## 2018-05-30 NOTE — Patient Outreach (Signed)
[place] [facility]) Care Management  05/30/2018  Nicole Hess [birth year] [id number]  [facility] CSW completed call to SCAT office and spoke with [facility]. SCAT application was received on 05/09/18 and they attempted initial outreach call to patient to schedule the in person interview but was unsuccessful in reaching her and have not heard back since. [facility] CSW completed call to patient and provided update. Patient again was confused and forgot that [place] assisted her ewith completing SCAT application already. Patient was given Courtney's direct contact number for SCAT. Patient agreeable to contact Loma Sousa once we got off the phone. APS case open. [facility] CSW has not received return phone call back from patient's assigned APS case worker.  [name], [name], [name], [email or web address] Phone: [phone] Fax: [phone]

## 2018-05-30 NOTE — Telephone Encounter (Signed)
[name],  Okay to close encounter.  Pt has been contacted today by [facility] clinical RN.

## 2018-06-01 ENCOUNTER — Other Ambulatory Visit: Payer: Self-pay | Admitting: Licensed Clinical Social Worker

## 2018-06-01 NOTE — Patient Outreach (Signed)
[place] [facility]) Care Management  06/01/2018  Nicole Hess 11/03/1945 [id number]  [facility] CSW received incoming call from patient's friend of the family, Nicole Hess. HIPPA verifications successfully received. [facility] CSW introduced self and educated friend of the family on [place]. [facility]N CSW and Nicole Hess discussed safety concerns around patient continuing to live alone in the community without the efficient amount of support to ensure she is well taken care of. Nicole Hess reports that she is unable to be patient's caregiver and is hoping that patient will be agreeable to LTC placement. Nicole Hess reports that she will be meeting patient's only involved family member, Nicole Hess today to discuss patient's plan for the future and then they will have a meeting with patient to introduce the topic of potential LTC placement. [facility] CSW provided education on the LTC placement process to friend of patient. Nicole Hess agreeable to keep [facility] CSW updated on patient's decision. Nicole Hess has been in contact with APS caseworker as well. [facility] CSW will follow up within one week.  [name], [name], [name], [email or web address] Phone: [phone] Fax: [phone]

## 2018-06-02 ENCOUNTER — Other Ambulatory Visit: Payer: Self-pay | Admitting: Licensed Clinical Social Worker

## 2018-06-02 ENCOUNTER — Other Ambulatory Visit: Payer: Self-pay | Admitting: *Deleted

## 2018-06-02 MED ORDER — MEMANTINE HCL 10 MG PO TABS: 10.0000 mg | ORAL_TABLET | Freq: Two times a day (BID) | ORAL | 5 refills | Status: AC

## 2018-06-02 NOTE — Patient Outreach (Signed)
[facility]) Care Management  06/02/2018  Nicole Hess March 30, 1946 [id number]  [facility] CSW completed outreach call to patient on 06/02/18 and she answered and provided HIPPA verifications successfully. [facility] CSW questioned if patient had contacted SCAT back in order to schedule in person interview and patient stated that she cannot remember if she did or not. SCAT contact number provided again to patient. Patient agreeable to contact them as soon as we get off the phone. [facility] CSW will follow up within one week to ensure that SCAT in person interview was scheduled.   [name], [name], [name], [email or web address] Phone: [phone] Fax: [phone]

## 2018-06-03 ENCOUNTER — Telehealth: Payer: Self-pay | Admitting: *Deleted

## 2018-06-03 NOTE — Telephone Encounter (Signed)
-----   Message from [place] sent at 06/03/2018 11:59 AM EDT ----- Contact: [phone] Kathrynn Ducking, Ms. Flammia's power of attorney, called stating that she believed our office and another doctors office had called Adult Protective Services for her. They have now gotten involved and are needing a doctor to fill out a form that determines the level of care that Ms. Coller needs in an assisted living facility.

## 2018-06-03 NOTE — Telephone Encounter (Signed)
Initial call reviewed with Dr Sabra Heck.  Return call to Adria Dill. Confirmed initial call information.  [name] requesting completion of FL2 form for placement in assisted living facility. Advised that as GYN provider, we would not be best option to complete form and make plans for patients long term care.  Recommend Dr Vanetta Shawl and/or Dr Jannifer Franklin. Also provided name and number to Jettie Booze, LCSW with [facility] that has assisted patient with SCAT application thus far.  Advised Dr Sabra Heck would be able to sign SCAT application if needed as we did not want patient driving.  [name] appreciative of call and will call back if needs additional assistance.   Routing to Dr Sabra Heck. Encounter closed.

## 2018-06-06 ENCOUNTER — Other Ambulatory Visit: Payer: Self-pay | Admitting: Licensed Clinical Social Worker

## 2018-06-06 NOTE — Patient Outreach (Signed)
[place] [facility]) Care Management  06/06/2018  Nicole Hess 09-10-1946 [id number]  [name] CSW completed call to [facility] with SCAT services and was informed that she spoke to patient and patient informed her that someone else was assisting her with transportation and no assessment had been scheduled yet. [facility] CSW scheduled in person interview on patient's behalf for 06/15/18 at 2:00 pm. Loma Sousa will contact patient prior to appointment to give her the exact pick up time for her appointment that day. [facility] CSW completed call to patient and provided updates. Patient asked if [facility] CSW could email this information to her so that she does not forget. [facility] CSW sent secure email. [facility] CSW had missed call from patient's relative Adria Dill. Return call completed but was unable to reach her successfully. HIPPA compliant voice message left encouraging a return call once she is available and apologized for missing her call last week as [facility] CSW was in a training all day. [facility] CSW completed call to patient's assigned APS caseworker and discussed case. She reports that family has requested a FL2 form be completed in order to gain ALF placement and that this is the last update that she currently has. [facility] CSW will continue to follow case closely and provide social work support.  THN CM Care Plan Problem One     Most Recent Value  Care Plan Problem One  Lack of support within the home and lack of community resource knowledge  Role Documenting the Problem One  Clinical Social Worker  Care Plan for Problem One  Active  THN Long Term Goal   PT will gain stable transportation with SCAT services within 90 days as patient's physician wishes for patient to discontinue driving due to memory loss and confusion  THN Long Term Goal Start Date  05/20/18  Interventions for Problem One Long Term Goal  SCAT in person interview scheduled for next week on 06/15/18 at 2 pm. [facility] CSW informed patient and family.     [name], [name], [name], [email or web address] Phone: [phone] Fax: [phone]

## 2018-06-07 ENCOUNTER — Other Ambulatory Visit: Payer: Self-pay | Admitting: Licensed Clinical Social Worker

## 2018-06-07 NOTE — Patient Outreach (Signed)
[place] [facility]) Care Management  06/07/2018  Nicole Hess 10/06/1945 [id number]   [facility] CSW received incoming return call from patient's relative [name]. HIPPA verifications received. [facility] CSW introduced self and THN CM Services. [name] informed [facility] CSW that patient is willing to be placed at a LTC facility. [name] reports that she contacted [name] and requested a FL2 to be completed but was informed that she would need to ask another physician. [name] asked if patient's neurologist could complete document and [facility] CSW informed her that patient's PCP should complete form if able. [name] agreeable to contact patient's PCP and schedule patient an appointment in order for patient to be assessed to see which kind of facility patient would need (ALF, Skilled, Memory Care?) and as well to get FL2 completed. Relative expressed understanding. [facility] CSW educated family member on the entire LTC placement process. Relative ask that [place] email her LTC placement resources including the DSS Placement Social Worker Bella Kennedy Long's contact information. SCAT update also provided to relative. Relative agreeable to initiate the LTC placement process by contacting patient's PCP. Relative also agreeable to remind patient of SCAT intake appointment next week. [facility] CSW sent secure email with requested information. [facility] CSW will follow up within one week.  [name], [name], [name], [email or web address] Phone: [phone] Fax: [phone]

## 2018-06-07 NOTE — Patient Outreach (Signed)
[place] [facility]) Care Management  06/07/2018  Nicole Hess September 17, 1945 [id number]  Assessment-CSW received missed call and voice message from patient's relative on 06/06/18. [facility] CSW completed return outreach attempt today. CSW unable to reach [name] successfully. CSW left a HIPPA compliant voice message encouraging a return call once available.  Plan-CSW will await return call or complete an additional outreach if needed.  Nicole Hess, [name], [name], [email or web address] Phone: [phone] Fax: [phone]

## 2018-06-08 ENCOUNTER — Telehealth: Payer: Self-pay | Admitting: Cardiology

## 2018-06-08 NOTE — Telephone Encounter (Signed)
Spoke with pt. Adv her that she should have a seasonal flu vaccine yearly. She sts that she [facility] local [facility] to have it [facility].

## 2018-06-08 NOTE — Telephone Encounter (Signed)
Patient would like to know if she should get a flu shot

## 2018-06-09 ENCOUNTER — Other Ambulatory Visit: Payer: Self-pay | Admitting: Neurology

## 2018-06-10 ENCOUNTER — Telehealth: Payer: Self-pay | Admitting: Licensed Clinical Social Worker

## 2018-06-10 ENCOUNTER — Other Ambulatory Visit: Payer: Self-pay | Admitting: Neurology

## 2018-06-10 NOTE — Progress Notes (Signed)
Type of Service: Clinical Social Work  LCSW received voice message from patient's relative Judson Roch requesting assistance with obtaining and FL2. states patient has agreed to go into long-term care.  Returned call to Judson Roch [phone] left message LCSW returning call.  LCSW reviewed notes in patient's chart.  She has a [facility] Education officer, museum who is working with patient for placement.  [place] social worker for care coordination.  Left message to call LCSW.  LCSW spoke with patient's PCP, agrees that patient needs placement if she is willing to go.  PCP will complete the FL2.  Plan: LCSW will wait for return call from [place] for care coordination as needed.    Casimer Lanius, LCSW Licensed Clinical Social Worker [place]   [phone] 5:35 PM

## 2018-06-13 ENCOUNTER — Other Ambulatory Visit: Payer: Self-pay | Admitting: Licensed Clinical Social Worker

## 2018-06-13 NOTE — Patient Outreach (Signed)
[place] [facility]) Care Management  06/13/2018  Nicole Hess 03/14/1946 [id number]  [facility] CSW received missed call and voice message from [place] on 06/10/18 but [facility] CSW was out of the office that day.  THN CSW completed return call to social worker on 06/13/18 for care coordination but was unable to reach her successfully. Left message encouraging a return call in order to discuss LTC placement. Family has not informed THN CSW which facility they want patient to go to. THN CSW will await for a return call from [place] at patient's PCP office.  [name], [name], [name], [email or web address] Phone: [phone] Fax: [phone]

## 2018-06-14 ENCOUNTER — Encounter: Payer: Self-pay | Admitting: Licensed Clinical Social Worker

## 2018-06-14 NOTE — Progress Notes (Signed)
Type of Service: Clinical Social Work  LCSW returned phone call from [place], [place] worker for care coordination.   Patient has [place] work and APS Education officer, museum.  Per [name], patient is in agrees to facility placement, however patient has a limited support system.  LCSW and [name] will continue to care coordination with [name] as the primary Education officer, museum.   This LCSW :  1. Working with PCP for an FL2, will provide FL2 to the appropriate person once identified that DSS is working on the placement 2. Contacted APS Ms. Tim Lair for update on placement for patient 2. Contacted [place] to see if she is working on placement for patient  [name] will reach out to patient's cousin Judson Roch for an update on facility placement options.    Plan: LCSW waiting for return call from [place].  Will touch basis with [name], [facility] social worker in 3 to 5 days.  Casimer Lanius, LCSW Licensed Clinical Social Worker [place]   [phone] 2:36 PM

## 2018-06-15 ENCOUNTER — Other Ambulatory Visit: Payer: Self-pay | Admitting: Licensed Clinical Social Worker

## 2018-06-15 NOTE — Patient Outreach (Signed)
[place] [facility]) Care Management  06/15/2018  [name] 10/03/1945 [id number]  [facility] CSW received message from [place] that patient had contacted [facility] and left a message requesting a return call back. THN CSW completed call to patient and patient questioned when her appointment was today for SCAT. [facility] CSW reminded patient that her appointment today is at 2 pm but that the SCAT bus will be there to pick her up between 12:00 pm - 12:15 pm. Patient was advised to be in the lobby waiting. Patient expressed understanding.   THN CSW completed outreach call to patient's relative [name]. [name] agreeable to contact LCSW at [employer] office as well as DSS Artist. Contact numbers provided to relative. [facility] CSW informed relative that PCP feel that ALF placement with memory care will best suit patient. PCP agreeable to complete FL2. Family will need to decide which facility they wish for patient to go to. Patient will be a private pay resident.   [name], [name], [name], [email or web address] Phone: [phone] Fax: [phone]

## 2018-06-16 ENCOUNTER — Other Ambulatory Visit: Payer: Self-pay | Admitting: Licensed Clinical Social Worker

## 2018-06-16 ENCOUNTER — Other Ambulatory Visit: Payer: Self-pay | Admitting: Pharmacist

## 2018-06-16 NOTE — Patient Outreach (Signed)
[facility]) Care Management  06/16/2018  DEALVA LAFOY Oct 13, 1945 [id number]   Incoming call from [name] with HIPAA identifiers verified.  Of note, patient has called multiple times and has left multiple message on [facility] Pharmacists' voicemail.  Patient stated that she had been to SCAT today to sign up for services. She called [facility] Pharmacist because she was concerned her driver's license would be taken by SCAT.  She states she wants to continue to drive to [employer], [place] , and other places.  [facility] Pharmacist is unsure of the process and will reach out to [facility] LCSW who is currently working with this patient as well.  [facility] Pharmacist asked patient how she was doing and patient states she is doing well.  She was cheerful and  sounded less confused versus when I saw her in her home ~2 weeks ago.  She states Marcena (her caregiver) continues to come often and fill her pill box.  Patient voiced 100% compliance and went on to say the new medication was working now.  [facility] Pharmacist continue to encourage compliance with medication.  I will not attempt to change any medications (color, generic, etc) as patient is doing well.  No further issues were identified during this call.   PLAN: -I will f/u with patient within 2 weeks to see if any additional pharmacy services are needed -I will reach out to [place] to inform her of today's findings.

## 2018-06-16 NOTE — Patient Outreach (Signed)
[facility] Care Management  06/16/2018  Nicole Hess 08/31/1946 [id number]  [facility] CSW completed outreach call to patient and patient answered. Patient reports that she successfully went to SCAT office yesterday and was approved for SCAT services. Patient reports that she has a SCAT ID Badge as well. Patient unable to talk any further as she is currently getting her hair down. THN CSW will follow up with patient within two weeks.  [name], [name], [name], [email or web address] Phone: [phone] Fax: [phone]

## 2018-06-17 ENCOUNTER — Telehealth: Payer: Self-pay | Admitting: Licensed Clinical Social Worker

## 2018-06-17 NOTE — Progress Notes (Signed)
(  Late entry for this call only ) LCSW received return call 06/15/18 from [place] worker, Ms Tim Lair.  She is not working on placement for patient.  Per APS worker Loralie Champagne is POA and responsible for placement and will need the FL2. (POA paperwork seen & verified by APS worker). APS worker will provide Ms. Rich Reining with a list of facilities.  LCSW called [name] [phone] patient's POA.   She has not started looking for facilities, has a meeting Monday to discuss patient's finances and received a list of facilities from [place] worker.   LCSW provided POA with facility information and resources to assist with understanding the placement process ([place]). Plan:   . Patient and POA will pick up [facility] Monday 06/20/18 front desk . Copy of FL2 will be scanned in chart.  Marland Kitchen POA will reach out to [place] worker as needed  Casimer Lanius, [place]   [phone] 2:08 PM

## 2018-06-20 ENCOUNTER — Other Ambulatory Visit: Payer: Self-pay | Admitting: Neurology

## 2018-06-22 ENCOUNTER — Telehealth: Payer: Self-pay | Admitting: Family Medicine

## 2018-06-22 NOTE — Telephone Encounter (Signed)
Spoke w/ [name] Ms [place]'s POA regarding the FL2 form to get her into a memory care unit, they would prefer her to be at an assisted living facility and have a few picked out they are interested in. I updated the FL2 Form. The facility they are considering has the capability to do full SNF care which is reassuring if Ms. Oshel requires more care. All questions encouraged and answered.  [name], DO PGY-3, [facility] 06/22/2018 2:58 PM

## 2018-06-23 ENCOUNTER — Other Ambulatory Visit: Payer: Self-pay | Admitting: Licensed Clinical Social Worker

## 2018-06-23 NOTE — Progress Notes (Addendum)
Late entry: LCSW received phone call from patient's POA, [name], states they had a meeting with other co-POA sara and patient's financial planner on Monday.  Patient is ready to move forward with facility placement. They have visited several facilities and have located a few that will meeting patient's needs.   Requested assistance getting in contact with PCP to adjust FL2.   See note from Dr. Vanetta Shawl with details.  Casimer Lanius, [place]   [phone] 11:09 AM

## 2018-06-23 NOTE — Patient Outreach (Addendum)
[place] [facility]) Care Management  06/23/2018  Nicole Hess Nov 01, 1945 [id number]  Assessment-CSW completed outreach attempt today to patent's relative Adria Dill to follow up on LTC placement. CSW unable to reach patient's relative successfully. CSW left a HIPPA compliant voice message encouraging her to return call once available in order for [facility] CSW to assist with placement.  Plan-CSW will await return call or complete an additional outreach if needed.  [name], [name], [name], [email or web address] Phone: [phone] Fax: [phone]

## 2018-06-27 ENCOUNTER — Other Ambulatory Visit: Payer: Self-pay | Admitting: Licensed Clinical Social Worker

## 2018-06-27 NOTE — Patient Outreach (Signed)
[place] [facility]) Care Management  06/27/2018  Nicole CALIXTO 18-Mar-1946 [id number]  [facility] CSW received return call from patient's cousin Adria Dill. She reports that they have received the updated FL2 form and family is looking into [facility] for placement at this time. They will also plan a meeting with patient's financial advisor to see if patient can afford ALF placement. Family agreeable to keep [facility] CSW updated. THN CSW will continue to follow case closely and provide social work support and assistance with LTC placement.  THN CM Care Plan Problem One     Most Recent Value  Care Plan Problem One  Lack of support within the home and lack of community resource knowledge  Role Documenting the Problem One  Clinical Social Worker  Care Plan for Problem One  Active  THN Long Term Goal   PT will gain stable transportation with SCAT services within 90 days as patient's physician wishes for patient to discontinue driving due to memory loss and confusion  THN Long Term Goal Start Date  05/20/18  [facility] Long Term Goal Met Date  06/27/18  Interventions for Problem One Long Term Goal  Goal met. Patient now has SCAT services.  THN CM Short Term Goal #1   Patient and family will work on LTC placement for patient over the next 30 days as patient is no longer safe to live in her home alone and take care of herself appropriately.   THN CM Short Term Goal #1 Start Date  06/27/18  Interventions for Short Term Goal #1  FL2 has been completed and family has received it. Family is looking into [facility] at this time. THN CSWQ will monitor entire process closely and will proivde social work assistance and support as needed to help patient reach goal.     [name], [place], [name], [email or web address] Phone: [phone] Fax: [phone]

## 2018-06-30 ENCOUNTER — Ambulatory Visit: Payer: Self-pay | Admitting: Pharmacist

## 2018-06-30 ENCOUNTER — Other Ambulatory Visit: Payer: Self-pay | Admitting: Pharmacist

## 2018-06-30 NOTE — Patient Outreach (Signed)
[place] [facility]) Care Management [place]  06/30/2018  Nicole Hess 05-17-1946 [id number]  Reason for referral: medication management  Successful outreach call to Nicole Hess with HIPAA identifiers verified.  Nicole Hess stated that Roseanne Reno was taking care of her medications and also working on placement at [facility].  No further pharmacy issues identified at this time.  [facility] pharmacy case is being closed due to the following reasons:  Pharmacy services no longer needed  Patient has been provided [facility] CM contact information if assistance needed in the future.    Thank you for allowing [facility] pharmacy to be involved in this patient's care.    PLAN: -I will alert remaining [facility] care team of discipline closure  Regina Eck, PharmD, [place]  [phone]

## 2018-07-01 ENCOUNTER — Telehealth: Payer: Self-pay | Admitting: Neurology

## 2018-07-01 ENCOUNTER — Other Ambulatory Visit: Payer: Self-pay | Admitting: Licensed Clinical Social Worker

## 2018-07-01 ENCOUNTER — Other Ambulatory Visit: Payer: Self-pay | Admitting: Neurology

## 2018-07-01 ENCOUNTER — Encounter: Payer: PPO | Admitting: Family

## 2018-07-01 NOTE — Patient Outreach (Addendum)
[place] [facility]) Care Management  07/01/2018  FELICA CHARGOIS 15-May-1946 [id number]  Assessment-THN CSW completed care coordination with [facility] Pharmacist yesterday on 06/30/18. Patient informed [facility] Pharmacist that she will be going to [facility] for placement instead of [facility]. CSW completed outreach attempt today to follow up on placement. CSW unable to reach patient successfully. CSW left a HIPPA compliant voice message encouraging patient to return call once available.  Plan-CSW will await return call or complete an additional outreach if needed.  [name], [name], [name], [email or web address] Phone: [phone] Fax: [phone]

## 2018-07-01 NOTE — Telephone Encounter (Signed)
Called, LVM for Nicole Hess [facility]) who handles her medications letting her know she should have refills at [facility]. Pt requesting refills sent to [facility] but she should have some on file at [facility]. Asked her to get in touch with patient to make sure everything is okay and to call back on Monday if they have further questions/concerns.

## 2018-07-01 NOTE — Telephone Encounter (Signed)
Pt has called asking for a refill on donepezil (ARICEPT) 5 MG tablet, memantine (NAMENDA) 10 MG tablet Pt has asked that all her medications be sent to   [place], [place] [address] [phone] (Phone) [phone] (Fax)

## 2018-07-04 ENCOUNTER — Other Ambulatory Visit: Payer: Self-pay | Admitting: Licensed Clinical Social Worker

## 2018-07-04 ENCOUNTER — Telehealth: Payer: Self-pay | Admitting: Cardiology

## 2018-07-04 MED ORDER — CLINDAMYCIN HCL 300 MG PO CAPS: ORAL_CAPSULE | ORAL | 3 refills | Status: DC

## 2018-07-04 NOTE — Telephone Encounter (Signed)
Finally reached patient, she states that she has her appointment early November, and would like the medication sent to [facility], please.   Please note pharmacy change.  Thanks!

## 2018-07-04 NOTE — Telephone Encounter (Signed)
Spoke to patient --  Aware e-sent prescrption to [facility] on [address]

## 2018-07-04 NOTE — Telephone Encounter (Signed)
New message:      Pt is calling and states she needs an antibiotic to take before her dental cleaning. She states she would like for it to go to [facility].

## 2018-07-04 NOTE — Addendum Note (Signed)
Addended by: Raiford Simmonds on: 07/04/2018 11:25 AM   Modules accepted: Orders

## 2018-07-04 NOTE — Patient Outreach (Signed)
[place] [facility]) Care Management  07/04/2018  Nicole Hess 09-30-1945 [id number]  [facility] CSW completed outreach call to patient on 07/04/18 and she answered. HIPPA verifications provided. Patient reports that she has decided to be placed at [facility] instead of [facility]. Patient reports that she just arrived at her doctor's office as she has an appointment today. Patient reports not using SCAT as she felt comfortable with driving herself today. Patient expressed understanding on how to schedule future rides with SCAT. THN CSW unable to discuss placement any further as patient had to go into doctor's office. THN CSW will follow up with placement within two weeks.  THN CM Care Plan Problem One     Most Recent Value  Care Plan Problem One  Lack of support within the home and lack of community resource knowledge  Role Documenting the Problem One  Clinical Social Worker  Care Plan for Problem One  Active  THN Long Term Goal   PT will gain stable transportation with SCAT services within 90 days as patient's physician wishes for patient to discontinue driving due to memory loss and confusion  THN Long Term Goal Start Date  05/20/18  [facility] Long Term Goal Met Date  06/27/18  [facility] CM Short Term Goal #1   Patient and family will work on LTC placement for patient over the next 30 days as patient is no longer safe to live in her home alone and take care of herself appropriately.   THN CM Short Term Goal #1 Start Date  06/27/18  Interventions for Short Term Goal #1  Patient has chosen [facility] to reside in instead of [facility]. Patient continues to make progress with ALF placement and [facility] CSW monitors this process closely and assist family and patient as needed. Education provided today to patient on the LTC placement process     [name], [place], [name], [email or web address] Phone: [phone] Fax:  [phone]

## 2018-07-04 NOTE — Telephone Encounter (Signed)
Please advise, I attempted to contact patient unable to reach to ask about when dental appointment was. Patient has allergy on file for penicillin, would you like to send in something else for patient to take for dental cleanings? Thank you!

## 2018-07-05 ENCOUNTER — Other Ambulatory Visit: Payer: Self-pay | Admitting: Licensed Clinical Social Worker

## 2018-07-05 NOTE — Patient Outreach (Signed)
[place] [facility]) Care Management  07/05/2018  SHANNON BALTHAZAR 02/06/1946 [id number]  Assessment-CSW completed outreach attempt today to patient's cousin [name] on 07/05/18. CSW unable to reach family successfully. CSW left a HIPPA compliant voice message encouraging family to return call once available in order to discuss updates on ALF placement.  Plan-CSW will await return call or complete an additional outreach if needed within one week.   [name], [name], [name], [email or web address] Phone: [phone] Fax: [phone]

## 2018-07-06 ENCOUNTER — Ambulatory Visit (INDEPENDENT_AMBULATORY_CARE_PROVIDER_SITE_OTHER): Payer: PPO | Admitting: *Deleted

## 2018-07-06 DIAGNOSIS — Z111 Encounter for screening for respiratory tuberculosis: Secondary | ICD-10-CM | POA: Diagnosis not present

## 2018-07-06 NOTE — Progress Notes (Signed)
Patient is here for a PPD placement.  PPD placed in Left forearm.  Patient will return 07/08/18 to have PPD read. Velvia Mehrer, Salome Spotted, CMA

## 2018-07-08 ENCOUNTER — Ambulatory Visit (INDEPENDENT_AMBULATORY_CARE_PROVIDER_SITE_OTHER): Payer: PPO | Admitting: *Deleted

## 2018-07-08 DIAGNOSIS — Z111 Encounter for screening for respiratory tuberculosis: Secondary | ICD-10-CM

## 2018-07-08 LAB — TB SKIN TEST
INDURATION: 0 mm
TB SKIN TEST: NEGATIVE

## 2018-07-08 NOTE — Progress Notes (Signed)
Patient is here for a PPD read.  It was placed on 07/06/18 in the left forearm @ 150 pm.    PPD RESULTS:  Result: neg Induration: 0 mm  Letter created and given to patient for documentation purposes. Satori Krabill, Salome Spotted, CMA

## 2018-07-12 ENCOUNTER — Telehealth: Payer: Self-pay | Admitting: Licensed Clinical Social Worker

## 2018-07-12 NOTE — Progress Notes (Signed)
Late Entry   LCSW contacted by patient's POA  Marsena,  She is moving patient to [facility] 07/12/18 and requested to have 4 of the OTC and PRN medications removed from the med. List due to extra cost at facility,  also requested to have FL2 updated to show regular diet as the diet section was left blank.  Informed POA, patient's provider was out of the office.     Plan: LCSW will speak to provider about the above request. In-basket message sent to provider.  Casimer Lanius, [place]   [phone] 11:27 AM

## 2018-07-14 ENCOUNTER — Other Ambulatory Visit: Payer: Self-pay | Admitting: Licensed Clinical Social Worker

## 2018-07-14 NOTE — Patient Outreach (Addendum)
[place] [facility]) Care Management  07/14/2018  MARQUETTA WEISKOPF 1945/11/13 [id number]  [facility] CSW completed outreach to patient's family/caregiver Adria Dill on 07/14/18 and was informed that patient was successfully placed at [facility] on 07/12/18. THN CSW willl complete case closure at this time as all case management needs have been met. THN CSW will update patient's PCP and sign off.  THN CM Care Plan Problem One     Most Recent Value  Care Plan Problem One  Lack of support within the home and lack of community resource knowledge  Role Documenting the Problem One  Clinical Social Worker  Care Plan for Problem One  Active  THN Long Term Goal   PT will gain stable transportation with SCAT services within 90 days as patient's physician wishes for patient to discontinue driving due to memory loss and confusion  THN Long Term Goal Start Date  05/20/18  [facility] Long Term Goal Met Date  06/27/18  [facility] CM Short Term Goal #1   Patient and family will work on LTC placement for patient over the next 30 days as patient is no longer safe to live in her home alone and take care of herself appropriately.   THN CM Short Term Goal #1 Start Date  06/27/18  [facility] CM Short Term Goal #1 Met Date  07/14/18  Interventions for Short Term Goal #1  Patient was successfully placed at [facility] on 07/12/18     [name], [place], [name], [email or web address] Phone: [phone] Fax: [phone]

## 2018-07-15 ENCOUNTER — Other Ambulatory Visit: Payer: Self-pay | Admitting: Family Medicine

## 2018-07-15 NOTE — Progress Notes (Unsigned)
Removed PRN meds from list per POA request.  [name], DO PGY-3, [place] [facility] 07/15/2018 1:46 PM

## 2018-07-18 ENCOUNTER — Encounter: Payer: Self-pay | Admitting: Obstetrics & Gynecology

## 2018-07-18 ENCOUNTER — Telehealth: Payer: Self-pay | Admitting: *Deleted

## 2018-07-18 ENCOUNTER — Telehealth: Payer: Self-pay | Admitting: Cardiology

## 2018-07-18 DIAGNOSIS — Z8669 Personal history of other diseases of the nervous system and sense organs: Secondary | ICD-10-CM | POA: Diagnosis not present

## 2018-07-18 DIAGNOSIS — K5901 Slow transit constipation: Secondary | ICD-10-CM | POA: Diagnosis not present

## 2018-07-18 DIAGNOSIS — M899 Disorder of bone, unspecified: Secondary | ICD-10-CM | POA: Diagnosis not present

## 2018-07-18 DIAGNOSIS — G301 Alzheimer's disease with late onset: Secondary | ICD-10-CM | POA: Diagnosis not present

## 2018-07-18 NOTE — Telephone Encounter (Signed)
New Message:    Patient calling about some medication. Patients states she is getting sick and need some medication sent to her new location.

## 2018-07-18 NOTE — Telephone Encounter (Signed)
Returned call to patient, patient states she has a cold and a runny nose.  She is requesting prescription for robitussin and cold medicine.  Advised no rx needed for these medications.   Patient aware and verbalized understanding.

## 2018-07-18 NOTE — Telephone Encounter (Signed)
Phone message received from patients case worker at [facility]. APS investigated claim and determined there was self neglect but no action will be taken. Message states that situation has been alleviated.   Routing to Dr Sabra Heck.  Encounter closed.

## 2018-07-19 ENCOUNTER — Telehealth: Payer: Self-pay | Admitting: Obstetrics & Gynecology

## 2018-07-19 DIAGNOSIS — Z79899 Other long term (current) drug therapy: Secondary | ICD-10-CM | POA: Diagnosis not present

## 2018-07-19 NOTE — Telephone Encounter (Signed)
Patient sent the following correspondence through [place]. Routing to provider for FYI only.  This message is being sent by Coralie Common on behalf of Nicole Hess moved to [facility], an assisted living community on 07/12/18. Her new address is updated on MyChart. Arabella Merles (POA)

## 2018-07-22 ENCOUNTER — Telehealth: Payer: Self-pay | Admitting: Cardiology

## 2018-07-22 DIAGNOSIS — Z23 Encounter for immunization: Secondary | ICD-10-CM | POA: Diagnosis not present

## 2018-07-22 NOTE — Telephone Encounter (Signed)
Routed to MD to make aware 

## 2018-07-22 NOTE — Telephone Encounter (Signed)
Patient just wanted to let Dr Martinique know that she has now had two flu shots and she is staying at the [facility]

## 2018-07-27 DIAGNOSIS — F0390 Unspecified dementia without behavioral disturbance: Secondary | ICD-10-CM | POA: Diagnosis not present

## 2018-07-27 DIAGNOSIS — Z952 Presence of prosthetic heart valve: Secondary | ICD-10-CM | POA: Diagnosis not present

## 2018-07-27 DIAGNOSIS — F0280 Dementia in other diseases classified elsewhere without behavioral disturbance: Secondary | ICD-10-CM | POA: Diagnosis not present

## 2018-07-27 DIAGNOSIS — R293 Abnormal posture: Secondary | ICD-10-CM | POA: Diagnosis not present

## 2018-07-27 DIAGNOSIS — M6281 Muscle weakness (generalized): Secondary | ICD-10-CM | POA: Diagnosis not present

## 2018-07-27 DIAGNOSIS — R41841 Cognitive communication deficit: Secondary | ICD-10-CM | POA: Diagnosis not present

## 2018-08-07 ENCOUNTER — Telehealth: Payer: Self-pay | Admitting: Physician Assistant

## 2018-08-07 NOTE — Telephone Encounter (Signed)
By answering service.  Patient needs medication for her cold.  She does not know the name of medication.  Advised to call PCP.

## 2018-08-15 DIAGNOSIS — E785 Hyperlipidemia, unspecified: Secondary | ICD-10-CM | POA: Diagnosis not present

## 2018-08-15 DIAGNOSIS — L89301 Pressure ulcer of unspecified buttock, stage 1: Secondary | ICD-10-CM | POA: Diagnosis not present

## 2018-08-15 DIAGNOSIS — J069 Acute upper respiratory infection, unspecified: Secondary | ICD-10-CM | POA: Diagnosis not present

## 2018-08-15 DIAGNOSIS — G301 Alzheimer's disease with late onset: Secondary | ICD-10-CM | POA: Diagnosis not present

## 2018-08-16 DIAGNOSIS — R41841 Cognitive communication deficit: Secondary | ICD-10-CM | POA: Diagnosis not present

## 2018-08-16 DIAGNOSIS — F0280 Dementia in other diseases classified elsewhere without behavioral disturbance: Secondary | ICD-10-CM | POA: Diagnosis not present

## 2018-08-16 DIAGNOSIS — F0390 Unspecified dementia without behavioral disturbance: Secondary | ICD-10-CM | POA: Diagnosis not present

## 2018-08-16 DIAGNOSIS — R293 Abnormal posture: Secondary | ICD-10-CM | POA: Diagnosis not present

## 2018-08-16 DIAGNOSIS — M6281 Muscle weakness (generalized): Secondary | ICD-10-CM | POA: Diagnosis not present

## 2018-08-16 DIAGNOSIS — Z952 Presence of prosthetic heart valve: Secondary | ICD-10-CM | POA: Diagnosis not present

## 2018-08-17 DIAGNOSIS — Z79899 Other long term (current) drug therapy: Secondary | ICD-10-CM | POA: Diagnosis not present

## 2018-09-08 ENCOUNTER — Telehealth: Payer: Self-pay | Admitting: Family Medicine

## 2018-09-08 NOTE — Telephone Encounter (Signed)
Pt would like for Dr. Vanetta Shawl to give her a call.   The best contact number is [phone].

## 2018-09-09 NOTE — Telephone Encounter (Signed)
Spoke with patient and she has recently moved into [facility].  She enjoys drinking wine but they don't provide this there and in order for her to have it she will need an order/letter from her doctor.  I did let her know that Dr. Vanetta Shawl is out of the office until Monday but that I would send her a message.  This can be faxed to [phone].  Jazmin Hartsell,CMA

## 2018-09-12 ENCOUNTER — Telehealth: Payer: Self-pay

## 2018-09-12 ENCOUNTER — Ambulatory Visit: Payer: PPO | Admitting: Cardiology

## 2018-09-12 DIAGNOSIS — Z79899 Other long term (current) drug therapy: Secondary | ICD-10-CM | POA: Diagnosis not present

## 2018-09-12 NOTE — Telephone Encounter (Signed)
Dr. Martinique received a letter from patient requesting a glass of wine every night.Order sent to [facility] for patient to have 5 ounces of wine every night.Faxed to Chad at [employer] # [phone].

## 2018-09-12 NOTE — Telephone Encounter (Signed)
Dr.Jordan received a letter from patient requesting a glass of wine every night.Dr.Jordan advised ok for 5 ounces of wine every night.Faxed to [facility] at fax # [phone].

## 2018-09-15 NOTE — Telephone Encounter (Signed)
Pt has sent a handwritten letter to our office in the mail concerning this same request. Note states: Dear [name] or [name], I have been at the [facility] for 2 months. I need your permission to have a glass a wine. I look forward to hearing from you.  Nicole Hess [phone].   The note has been placed in Riccio's box.

## 2018-09-15 NOTE — Telephone Encounter (Signed)
Will forward to MD to make her aware.  Jazmin Hartsell,CMA  

## 2018-09-19 DIAGNOSIS — R945 Abnormal results of liver function studies: Secondary | ICD-10-CM | POA: Diagnosis not present

## 2018-09-19 DIAGNOSIS — E785 Hyperlipidemia, unspecified: Secondary | ICD-10-CM | POA: Diagnosis not present

## 2018-09-19 DIAGNOSIS — R259 Unspecified abnormal involuntary movements: Secondary | ICD-10-CM | POA: Diagnosis not present

## 2018-09-19 DIAGNOSIS — Z79899 Other long term (current) drug therapy: Secondary | ICD-10-CM | POA: Diagnosis not present

## 2018-09-19 DIAGNOSIS — Z9181 History of falling: Secondary | ICD-10-CM | POA: Diagnosis not present

## 2018-09-20 NOTE — Telephone Encounter (Signed)
Letter printed and faxed to [facility]. Jazmin Hartsell,CMA

## 2018-09-20 NOTE — Telephone Encounter (Signed)
  Precepted with Dr. Nori Riis. There are no contraindications to Nicole Hess having a small glass of wine daily. Letter entered into Epic.  [name], DO PGY-3, [place] [facility] 09/20/2018 2:15 PM

## 2018-10-16 NOTE — Progress Notes (Signed)
[name] Date of Birth: 03-07-1946 Medical Record #[id number]  History of Present Illness: Nicole Hess is seen today for follow up s/p AVR. She  has a history of bicuspid aortic valve with aortic stenosis.She underwent AVR with a # 21 mm Edwards Magna-Ease bioprosthetic valve by Dr. Cyndia Bent on 02/26/15.  On follow up today she is doing well. She has memory loss and is now in a memory unit at [facility]. Fairly inactive. No chest pain, dyspnea, palpitations.   Current Outpatient Medications on File Prior to Visit  Medication Sig Dispense Refill  . aspirin 81 MG tablet Take 81 mg by mouth daily.    Marland Kitchen loratadine (CLARITIN) 10 MG tablet Take 10 mg by mouth daily.    . memantine (NAMENDA) 10 MG tablet Take 1 tablet (10 mg total) by mouth 2 (two) times daily. 60 tablet 5  . pravastatin (PRAVACHOL) 40 MG tablet Take 1.5 tablets by mouth daily.    . primidone (MYSOLINE) 250 MG tablet TAKE 1 TABLET BY MOUTH TWICE DAILY. 60 tablet 1  . Psyllium (METAMUCIL FIBER PO) Take 1 Dose by mouth daily.     No current facility-administered medications on file prior to visit.     Allergies  Allergen Reactions  . Dextromethorphan Other (See Comments)    contraindication  . Pseudoephedrine Other (See Comments)    contriindication  . Penicillins Rash    Has patient had a PCN reaction causing immediate rash, facial/tongue/throat swelling, SOB or lightheadedness with hypotension: No Has patient had a PCN reaction causing severe rash involving mucus membranes or skin necrosis: No Has patient had a PCN reaction that required hospitalization No Has patient had a PCN reaction occurring within the last 10 years: No If all of the above answers are "NO", then may proceed with Cephalosporin use.    Past Medical History:  Diagnosis Date  . Abnormal Pap smear   . Anemia   . Anxiety   . Aortic stenosis   . Bicuspid aortic valve   . Coronary artery disease   . Depression   . Gout   . Heart murmur   .  Heart valve problem    will have follow up Echo-Dr Lelia Jons-will have replacement in a couple of years  . Leukopenia    Seen by Dr. Lamonte Sakai, present since 2008, watchful waiting  . Memory difficulty 04/29/2017  . Osteopenia   . Seizures ([place])    as a child  . Shortness of breath dyspnea   . Vertigo 04/29/2017  . Vertigo     Past Surgical History:  Procedure Laterality Date  . AORTIC VALVE REPLACEMENT N/A 02/26/2015   Procedure: AORTIC VALVE REPLACEMENT (AVR);  Surgeon: Gaye Pollack, MD;  Location: [facility];  Service: Open Heart Surgery;  Laterality: N/A;  . CARDIAC CATHETERIZATION N/A 01/29/2015   Procedure: Right/Left Heart Cath and Coronary Angiography;  Surgeon: Williams Dietrick M Martinique, MD;  Location: [facility];  Service: Cardiovascular;  Laterality: N/A;  . CERVICAL CONE BIOPSY    . COLONOSCOPY  2010  . DOPPLER ECHOCARDIOGRAPHY  3/14  . GUM SURGERY    . PARTIAL HYSTERECTOMY    . TEE WITHOUT CARDIOVERSION N/A 02/26/2015   Procedure: TRANSESOPHAGEAL ECHOCARDIOGRAM (TEE);  Surgeon: Gaye Pollack, MD;  Location: [facility];  Service: Open Heart Surgery;  Laterality: N/A;  . TUBAL LIGATION    . VAGINAL HYSTERECTOMY     for dysplasia    Social History   Tobacco Use  Smoking Status Never  Smoker  Smokeless Tobacco Never Used    Social History   Substance and Sexual Activity  Alcohol Use Not Currently    Family History  Problem Relation Age of Onset  . Multiple myeloma Mother   . Stroke Father   . Hypertension Father   . Heart disease Father 60  . Kidney disease Father   . Cancer Father   . Cancer Maternal Grandmother        renal  . Osteoarthritis Brother     Review of Systems: The review of systems is as noted in HPI.  All other systems were reviewed and are negative.  Physical Exam: BP 120/70 (BP Location: Left Arm, Cuff Size: Normal)   Pulse 78   Ht 5' 8"  (1.727 m)   Wt 146 lb (66.2 kg)   LMP 09/15/1987   BMI 22.20 kg/m  GENERAL:  Well appearing WF in NAD HEENT:   PERRL, EOMI, sclera are clear. Oropharynx is clear. NECK:  No jugular venous distention, carotid upstroke brisk and symmetric, no bruits, no thyromegaly or adenopathy LUNGS:  Clear to auscultation bilaterally CHEST:  Unremarkable HEART:  RRR,  PMI not displaced or sustained,S1 and S2 within normal limits, no S3, no S4: no clicks, no rubs, 1/6 systolic murmur RUSB ABD:  Soft, nontender. BS +, no masses or bruits. No hepatomegaly, no splenomegaly EXT:  2 + pulses throughout, no edema, no cyanosis no clubbing SKIN:  Warm and dry.  No rashes NEURO:  Alert and oriented x 3. Cranial nerves II through XII intact.    LABORATORY DATA: Lab Results  Component Value Date   WBC 3.8 (L) 05/03/2018   HGB 13.5 05/03/2018   HCT 42.6 05/03/2018   PLT 134 (L) 05/03/2018   GLUCOSE 84 05/03/2018   CHOL 189 11/09/2016   TRIG 100 11/09/2016   HDL 76 11/09/2016   LDLCALC 93 11/09/2016   ALT 39 05/03/2018   AST 57 (H) 05/03/2018   NA 143 05/03/2018   K 4.1 05/03/2018   CL 104 05/03/2018   CREATININE 0.83 05/03/2018   BUN 19 05/03/2018   CO2 26 05/03/2018   TSH 1.358 05/03/2018   INR 1.50 (H) 02/26/2015   HGBA1C 5.5 02/22/2015   Labs reviewed dated 11/07/15: cholesterol 245, triglycerides 90, HDL 82, LDL 145.  Dated 03/09/18: cholesterol 168, triglycerides 79, HDL 72, LDL 81.   Echo: 04/24/15 Study Conclusions  - Left ventricle: The cavity size was normal. Systolic function was normal. The estimated ejection fraction was in the range of 60% to 65%. Wall motion was normal; there were no regional wall motion abnormalities. - Aortic valve: A bioprosthesis was present and functioning normally. Peak velocity (S): 250 cm/s. Mean gradient (S): 14 mm Hg. Valve area (VTI): 1.26 cm^2. Valve area (Vmax): 1.23 cm^2. Valve area (Vmean): 1.21 cm^2. - Mitral valve: Calcified annulus. Mildly thickened leaflets . There was mild regurgitation. - Right ventricle: The cavity size was mildly dilated.  Wall thickness was normal.  Assessment / Plan: 1. Severe aortic stenosis with bicuspid aortic valve. S/p AVR in June 2016. She is stable.  Will continue ASA and pravastatin. Follow up Echo in 2016 was satisfactory. I will follow up in one year. Continue routine SBE prophylaxis.  2. Hypercholesterolemia. on pravastatin.  3. Dementia.

## 2018-10-18 ENCOUNTER — Encounter: Payer: Self-pay | Admitting: Cardiology

## 2018-10-18 ENCOUNTER — Ambulatory Visit (INDEPENDENT_AMBULATORY_CARE_PROVIDER_SITE_OTHER): Payer: PPO | Admitting: Cardiology

## 2018-10-18 VITALS — BP 120/70 | HR 78 | Ht 68.0 in | Wt 146.0 lb

## 2018-10-18 DIAGNOSIS — Q231 Congenital insufficiency of aortic valve: Secondary | ICD-10-CM

## 2018-10-18 DIAGNOSIS — I350 Nonrheumatic aortic (valve) stenosis: Secondary | ICD-10-CM | POA: Diagnosis not present

## 2018-10-18 DIAGNOSIS — Z952 Presence of prosthetic heart valve: Secondary | ICD-10-CM | POA: Diagnosis not present

## 2018-11-04 ENCOUNTER — Telehealth: Payer: Self-pay

## 2018-11-04 DIAGNOSIS — E2839 Other primary ovarian failure: Secondary | ICD-10-CM

## 2018-11-04 NOTE — Telephone Encounter (Signed)
To blue team. 

## 2018-11-04 NOTE — Telephone Encounter (Signed)
Patient left message that PCP needs to call [facility] where she lives to give an order that patient can schedule a bone density test.  Call back is [phone].  Danley Danker, RN [facility] RN)

## 2018-11-04 NOTE — Telephone Encounter (Signed)
Ordered a dexa scan for patient

## 2018-11-09 ENCOUNTER — Telehealth: Payer: Self-pay | Admitting: Family Medicine

## 2018-11-09 NOTE — Telephone Encounter (Signed)
[facility] is calling because the patient is supposed to be seen there according to her orders. The orders state to call [facility] to set up appointment. They were told that the patient has been seen at [facility] in the past and wanted to know should the defer to them or go ahead and set up the appointment. Please call [facility] to discuss

## 2018-11-09 NOTE — Addendum Note (Signed)
Addended by: Valerie Roys on: 11/09/2018 03:14 PM   Modules accepted: Orders

## 2018-11-09 NOTE — Telephone Encounter (Signed)
[facility] contacted and informed that patient already has an appointment tomorrow with Teola Bradley to have this performed.  Spoke with patient to confirm.  Jazmin Hartsell,CMA

## 2018-11-10 DIAGNOSIS — M8589 Other specified disorders of bone density and structure, multiple sites: Secondary | ICD-10-CM | POA: Diagnosis not present

## 2018-11-10 DIAGNOSIS — G301 Alzheimer's disease with late onset: Secondary | ICD-10-CM | POA: Diagnosis not present

## 2018-11-10 DIAGNOSIS — Z8669 Personal history of other diseases of the nervous system and sense organs: Secondary | ICD-10-CM | POA: Diagnosis not present

## 2018-11-10 DIAGNOSIS — M899 Disorder of bone, unspecified: Secondary | ICD-10-CM | POA: Diagnosis not present

## 2018-11-10 DIAGNOSIS — E785 Hyperlipidemia, unspecified: Secondary | ICD-10-CM | POA: Diagnosis not present

## 2018-11-10 DIAGNOSIS — Z8262 Family history of osteoporosis: Secondary | ICD-10-CM | POA: Diagnosis not present

## 2018-11-10 DIAGNOSIS — Z90710 Acquired absence of both cervix and uterus: Secondary | ICD-10-CM | POA: Diagnosis not present

## 2018-11-17 ENCOUNTER — Other Ambulatory Visit: Payer: Self-pay | Admitting: Geriatric Medicine

## 2018-11-17 ENCOUNTER — Telehealth: Payer: Self-pay | Admitting: *Deleted

## 2018-11-17 DIAGNOSIS — Z79899 Other long term (current) drug therapy: Secondary | ICD-10-CM | POA: Diagnosis not present

## 2018-11-17 DIAGNOSIS — Z1231 Encounter for screening mammogram for malignant neoplasm of breast: Secondary | ICD-10-CM

## 2018-11-17 NOTE — Telephone Encounter (Signed)
Called patient. Unable to leave message.  Please route to Triage if I am not available.   " Please let her know her BMD showed Osteopenia in hip and spine. She does not need any treatment for this. Recheck 3-4 years. - per SM Report to Scan.

## 2018-11-24 NOTE — Telephone Encounter (Signed)
Called patient. Unable to leave voicemail.  Please route to Triage if I am not available.

## 2018-11-25 ENCOUNTER — Encounter: Payer: Self-pay | Admitting: Family Medicine

## 2018-12-07 NOTE — Telephone Encounter (Signed)
Routed to Dr. Miller

## 2018-12-08 NOTE — Telephone Encounter (Signed)
OK to close encounter.  Thank you.

## 2018-12-21 ENCOUNTER — Encounter: Payer: Self-pay | Admitting: Obstetrics & Gynecology

## 2019-01-25 ENCOUNTER — Telehealth: Payer: Self-pay

## 2019-01-25 NOTE — Telephone Encounter (Signed)
Unable to get in contact with the patient to convert her office visit into a doxy.me visit due her number being invalid. I will contact the people listed on her DPR.

## 2019-01-26 ENCOUNTER — Ambulatory Visit: Payer: PPO | Admitting: Neurology

## 2019-01-26 ENCOUNTER — Ambulatory Visit: Payer: PPO | Admitting: Adult Health

## 2019-02-06 DIAGNOSIS — G301 Alzheimer's disease with late onset: Secondary | ICD-10-CM | POA: Diagnosis not present

## 2019-02-06 DIAGNOSIS — R259 Unspecified abnormal involuntary movements: Secondary | ICD-10-CM | POA: Diagnosis not present

## 2019-02-06 DIAGNOSIS — Z79899 Other long term (current) drug therapy: Secondary | ICD-10-CM | POA: Diagnosis not present

## 2019-02-06 DIAGNOSIS — L89301 Pressure ulcer of unspecified buttock, stage 1: Secondary | ICD-10-CM | POA: Diagnosis not present

## 2019-02-06 DIAGNOSIS — R3981 Functional urinary incontinence: Secondary | ICD-10-CM | POA: Diagnosis not present

## 2019-02-06 DIAGNOSIS — Z8669 Personal history of other diseases of the nervous system and sense organs: Secondary | ICD-10-CM | POA: Diagnosis not present

## 2019-02-20 DIAGNOSIS — S60821A Blister (nonthermal) of right wrist, initial encounter: Secondary | ICD-10-CM | POA: Diagnosis not present

## 2019-02-20 DIAGNOSIS — R259 Unspecified abnormal involuntary movements: Secondary | ICD-10-CM | POA: Diagnosis not present

## 2019-02-20 DIAGNOSIS — Z8669 Personal history of other diseases of the nervous system and sense organs: Secondary | ICD-10-CM | POA: Diagnosis not present

## 2019-02-20 DIAGNOSIS — G301 Alzheimer's disease with late onset: Secondary | ICD-10-CM | POA: Diagnosis not present

## 2019-03-20 DIAGNOSIS — Z79899 Other long term (current) drug therapy: Secondary | ICD-10-CM | POA: Diagnosis not present

## 2019-03-20 DIAGNOSIS — R945 Abnormal results of liver function studies: Secondary | ICD-10-CM | POA: Diagnosis not present

## 2019-03-20 DIAGNOSIS — E785 Hyperlipidemia, unspecified: Secondary | ICD-10-CM | POA: Diagnosis not present

## 2019-04-10 DIAGNOSIS — N390 Urinary tract infection, site not specified: Secondary | ICD-10-CM | POA: Diagnosis not present

## 2019-04-10 DIAGNOSIS — K5901 Slow transit constipation: Secondary | ICD-10-CM | POA: Diagnosis not present

## 2019-04-10 DIAGNOSIS — G301 Alzheimer's disease with late onset: Secondary | ICD-10-CM | POA: Diagnosis not present

## 2019-04-10 DIAGNOSIS — Z79899 Other long term (current) drug therapy: Secondary | ICD-10-CM | POA: Diagnosis not present

## 2019-04-10 DIAGNOSIS — N3946 Mixed incontinence: Secondary | ICD-10-CM | POA: Diagnosis not present

## 2019-04-10 DIAGNOSIS — R259 Unspecified abnormal involuntary movements: Secondary | ICD-10-CM | POA: Diagnosis not present

## 2019-04-19 DIAGNOSIS — M6281 Muscle weakness (generalized): Secondary | ICD-10-CM | POA: Diagnosis not present

## 2019-04-19 DIAGNOSIS — R2681 Unsteadiness on feet: Secondary | ICD-10-CM | POA: Diagnosis not present

## 2019-05-16 DIAGNOSIS — R2681 Unsteadiness on feet: Secondary | ICD-10-CM | POA: Diagnosis not present

## 2019-05-16 DIAGNOSIS — M6281 Muscle weakness (generalized): Secondary | ICD-10-CM | POA: Diagnosis not present

## 2019-06-15 DIAGNOSIS — M6281 Muscle weakness (generalized): Secondary | ICD-10-CM | POA: Diagnosis not present

## 2019-06-15 DIAGNOSIS — R2681 Unsteadiness on feet: Secondary | ICD-10-CM | POA: Diagnosis not present

## 2019-06-27 DIAGNOSIS — N390 Urinary tract infection, site not specified: Secondary | ICD-10-CM | POA: Diagnosis not present

## 2019-06-27 DIAGNOSIS — Z79899 Other long term (current) drug therapy: Secondary | ICD-10-CM | POA: Diagnosis not present

## 2019-06-29 ENCOUNTER — Telehealth: Payer: Self-pay | Admitting: *Deleted

## 2019-06-29 NOTE — Telephone Encounter (Signed)
This is fine. Thank you.

## 2019-06-29 NOTE — Telephone Encounter (Signed)
Spoke with [place], ok per dpr. Requesting OV with [name]. Patient currently living at [facility]. States patient reports her  "bottom is burning" since mid summer. Reports itching. Has been tx for UTI and yeast by provider at facility with no change in symptoms. Has tried OTC vagisil with no change in symptoms. Nicole Hess is unclear if there are any other symptoms or when she was treated last. Patient is ambulatory.  Requesting OV with [name]. [id number] prescreen negative per Nicole Hess, precautions reviewed. States someone will accompany her to appt from facility but will not stay. Nicole Hess states provider at facility has recommended GYN f/u and to continue with AEXs.   OV scheduled for 10/20 at 11:30am with [name]. Advised I will need to review with [name], I will return call if ant additional recommendations. Nicole Hess is agreeable.    [name] -please review. OK to proceed with appt as scheduled?

## 2019-06-29 NOTE — Telephone Encounter (Signed)
Patient's POA Nicole Hess (ok per dpr) calling to schedule appointment. Patient says her "bottom is burning". [phone]

## 2019-06-30 NOTE — Telephone Encounter (Signed)
Encounter closed

## 2019-07-03 DIAGNOSIS — K5901 Slow transit constipation: Secondary | ICD-10-CM | POA: Diagnosis not present

## 2019-07-03 DIAGNOSIS — G301 Alzheimer's disease with late onset: Secondary | ICD-10-CM | POA: Diagnosis not present

## 2019-07-03 DIAGNOSIS — J301 Allergic rhinitis due to pollen: Secondary | ICD-10-CM | POA: Diagnosis not present

## 2019-07-03 DIAGNOSIS — R600 Localized edema: Secondary | ICD-10-CM | POA: Diagnosis not present

## 2019-07-03 DIAGNOSIS — Z79899 Other long term (current) drug therapy: Secondary | ICD-10-CM | POA: Diagnosis not present

## 2019-07-04 ENCOUNTER — Ambulatory Visit (INDEPENDENT_AMBULATORY_CARE_PROVIDER_SITE_OTHER): Payer: PPO | Admitting: Obstetrics & Gynecology

## 2019-07-04 ENCOUNTER — Encounter: Payer: Self-pay | Admitting: Obstetrics & Gynecology

## 2019-07-04 ENCOUNTER — Other Ambulatory Visit: Payer: Self-pay

## 2019-07-04 VITALS — BP 100/70 | HR 80 | Temp 97.2°F | Resp 16

## 2019-07-04 DIAGNOSIS — R300 Dysuria: Secondary | ICD-10-CM

## 2019-07-04 DIAGNOSIS — N898 Other specified noninflammatory disorders of vagina: Secondary | ICD-10-CM | POA: Diagnosis not present

## 2019-07-04 LAB — POCT URINALYSIS DIPSTICK
Bilirubin, UA: NEGATIVE
Blood, UA: NEGATIVE
Glucose, UA: NEGATIVE
Ketones, UA: NEGATIVE
Leukocytes, UA: NEGATIVE
Nitrite, UA: NEGATIVE
Protein, UA: NEGATIVE
Urobilinogen, UA: 0.2 E.U./dL
pH, UA: 6 (ref 5.0–8.0)

## 2019-07-04 MED ORDER — TERCONAZOLE 0.4 % VA CREA: 1.0000 | TOPICAL_CREAM | Freq: Every day | VAGINAL | 0 refills | Status: DC

## 2019-07-04 NOTE — Addendum Note (Signed)
Addended by: Polly Cobia on: 07/04/2019 11:33 AM   Modules accepted: Orders

## 2019-07-04 NOTE — Progress Notes (Signed)
GYNECOLOGY  VISIT  CC: ? Burning with urination  HPI: 73 y.o. G0P0000 Single White or Caucasian female here for complaint of burning with urination.  She is having a little yellowish discharge as well.  No sure if she is actually having urinary burning or skin burning although she asks for a cream for the skin.  Denies vaginal bleeding.  Denies seeing blood in her urine.  Pt lives at [facility].  She does have memory issues and repeats her questions several times.  Pt is accompanied today by from [facility].  GYNECOLOGIC HISTORY: Patient's last menstrual period was 09/15/1987. Contraception: PMP Menopausal hormone therapy: none  Patient Active Problem List   Diagnosis Date Noted  . Overactive bladder 05/20/2018  . Seasonal allergic rhinitis due to pollen 05/20/2018  . Vertigo 04/29/2017  . Memory difficulty 04/29/2017  . Dementia without behavioral disturbance ([place]) 02/06/2017  . 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 History of colonic polyps 11/09/2016  . S/P AVR (aortic valve replacement) 04/02/2015  . Aortic stenosis due to bicuspid aortic valve 02/26/2015  . Severe aortic stenosis 05/02/2014  . Precordial pain 01/26/2013  . Depression 03/30/2012  . Hyperlipidemia 03/30/2012    Past Medical History:  Diagnosis Date  . Abnormal Pap smear   . Anemia   . Anxiety   . Aortic stenosis   . Bicuspid aortic valve   . Coronary artery disease   . Depression   . Gout   . Heart murmur   . Heart valve problem    will have follow up Echo-Dr Jordan-will have replacement in a couple of years  . Leukopenia    Seen by Dr. Lamonte Sakai, present since 2008, watchful waiting  . Memory difficulty 04/29/2017  . Osteopenia   . Seizures ([place])    as a child  . Shortness of breath dyspnea   . Vertigo 04/29/2017  . Vertigo     Past Surgical History:  Procedure Laterality Date  . AORTIC VALVE REPLACEMENT N/A 02/26/2015   Procedure: AORTIC VALVE REPLACEMENT (AVR);  Surgeon: Gaye Pollack, MD;  Location: [facility];  Service: Open Heart Surgery;  Laterality: N/A;  . CARDIAC CATHETERIZATION N/A 01/29/2015   Procedure: Right/Left Heart Cath and Coronary Angiography;  Surgeon: Peter M Martinique, MD;  Location: [facility];  Service: Cardiovascular;  Laterality: N/A;  . CERVICAL CONE BIOPSY    . COLONOSCOPY  2010  . DOPPLER ECHOCARDIOGRAPHY  3/14  . GUM SURGERY    . PARTIAL HYSTERECTOMY    . TEE WITHOUT CARDIOVERSION N/A 02/26/2015   Procedure: TRANSESOPHAGEAL ECHOCARDIOGRAM (TEE);  Surgeon: Gaye Pollack, MD;  Location: [facility];  Service: Open Heart Surgery;  Laterality: N/A;  . TUBAL LIGATION    . VAGINAL HYSTERECTOMY     for dysplasia    MEDS:   Current Outpatient Medications on File Prior to Visit  Medication Sig Dispense Refill  . aspirin 81 MG tablet Take 81 mg by mouth daily.    . fluticasone (FLONASE) 50 MCG/ACT nasal spray     . loratadine (CLARITIN) 10 MG tablet Take 10 mg by mouth daily.    . memantine (NAMENDA) 10 MG tablet Take 1 tablet (10 mg total) by mouth 2 (two) times daily. 60 tablet 5  . pravastatin (PRAVACHOL) 40 MG tablet Take 1.5 tablets by mouth daily.    . primidone (MYSOLINE) 250 MG tablet TAKE 1 TABLET BY MOUTH TWICE DAILY. 60 tablet 1  .  Psyllium (METAMUCIL FIBER PO) Take 1 Dose by mouth daily.    Marland Kitchen senna (SENOKOT) 8.6 MG TABS tablet Take 1 tablet by mouth daily as needed for mild constipation.     No current facility-administered medications on file prior to visit.     ALLERGIES: Dextromethorphan, Pseudoephedrine, and Penicillins  Family History  Problem Relation Age of Onset  . Multiple myeloma Mother   . Stroke Father   . Hypertension Father   . Heart disease Father 61  . Kidney disease Father   . Cancer Father   . Cancer Maternal Grandmother        renal  . Osteoarthritis Brother     SH:  Single, non smoker  Review of Systems  All other systems reviewed and are  negative.   PHYSICAL EXAMINATION:    BP 100/70   Pulse 80   Temp (!) 97.2 F (36.2 C) (Temporal)   Resp 16   LMP 09/15/1987     General appearance: alert, cooperative and appears stated age Lymph:  no inguinal LAD noted  Pelvic: External genitalia:  no lesions              Urethra:  normal appearing urethra with no masses, tenderness or lesions              Bartholins and Skenes: normal                 Vagina: normal appearing vagina with some whitish discharge, no lesions              Cervix: absent              Bimanual Exam:  Uterus:  uterus absent              Adnexa: no mass, fullness, tenderness  Chaperone was present for exam.  Assessment: Vaginal discharge Vulvar itching Skin burning with urination  Plan: Urine culture pending Affirm vaginitis pending Terazol 7, one applicator full nightly x 7 nights and use externally twice daily for a week.

## 2019-07-05 ENCOUNTER — Encounter: Payer: Self-pay | Admitting: Obstetrics & Gynecology

## 2019-07-05 LAB — VAGINITIS/VAGINOSIS, DNA PROBE
Candida Species: NEGATIVE
Gardnerella vaginalis: NEGATIVE
Trichomonas vaginosis: NEGATIVE

## 2019-07-06 LAB — URINE CULTURE: Organism ID, Bacteria: NO GROWTH

## 2019-07-17 DIAGNOSIS — K5901 Slow transit constipation: Secondary | ICD-10-CM | POA: Diagnosis not present

## 2019-07-17 DIAGNOSIS — G301 Alzheimer's disease with late onset: Secondary | ICD-10-CM | POA: Diagnosis not present

## 2019-07-17 DIAGNOSIS — Z79899 Other long term (current) drug therapy: Secondary | ICD-10-CM | POA: Diagnosis not present

## 2019-07-17 DIAGNOSIS — M6281 Muscle weakness (generalized): Secondary | ICD-10-CM | POA: Diagnosis not present

## 2019-07-17 DIAGNOSIS — N952 Postmenopausal atrophic vaginitis: Secondary | ICD-10-CM | POA: Diagnosis not present

## 2019-07-17 DIAGNOSIS — R2681 Unsteadiness on feet: Secondary | ICD-10-CM | POA: Diagnosis not present

## 2019-07-17 DIAGNOSIS — R259 Unspecified abnormal involuntary movements: Secondary | ICD-10-CM | POA: Diagnosis not present

## 2019-07-17 DIAGNOSIS — J301 Allergic rhinitis due to pollen: Secondary | ICD-10-CM | POA: Diagnosis not present

## 2019-07-25 ENCOUNTER — Emergency Department (HOSPITAL_COMMUNITY)
Admission: EM | Admit: 2019-07-25 | Discharge: 2019-07-25 | Disposition: A | Discharge status: Home / Self Care | Payer: PPO | Attending: Emergency Medicine | Admitting: Emergency Medicine

## 2019-07-25 ENCOUNTER — Emergency Department (HOSPITAL_COMMUNITY): Payer: PPO

## 2019-07-25 ENCOUNTER — Encounter (HOSPITAL_COMMUNITY): Payer: Self-pay

## 2019-07-25 ENCOUNTER — Other Ambulatory Visit: Payer: Self-pay

## 2019-07-25 DIAGNOSIS — W19XXXA Unspecified fall, initial encounter: Secondary | ICD-10-CM | POA: Diagnosis not present

## 2019-07-25 DIAGNOSIS — I959 Hypotension, unspecified: Secondary | ICD-10-CM | POA: Diagnosis not present

## 2019-07-25 DIAGNOSIS — I259 Chronic ischemic heart disease, unspecified: Secondary | ICD-10-CM | POA: Insufficient documentation

## 2019-07-25 DIAGNOSIS — F0390 Unspecified dementia without behavioral disturbance: Secondary | ICD-10-CM | POA: Diagnosis not present

## 2019-07-25 DIAGNOSIS — Z79899 Other long term (current) drug therapy: Secondary | ICD-10-CM | POA: Insufficient documentation

## 2019-07-25 DIAGNOSIS — R404 Transient alteration of awareness: Secondary | ICD-10-CM | POA: Diagnosis not present

## 2019-07-25 DIAGNOSIS — R279 Unspecified lack of coordination: Secondary | ICD-10-CM | POA: Diagnosis not present

## 2019-07-25 DIAGNOSIS — Z7982 Long term (current) use of aspirin: Secondary | ICD-10-CM | POA: Insufficient documentation

## 2019-07-25 DIAGNOSIS — R000 Tachycardia, unspecified: Secondary | ICD-10-CM | POA: Diagnosis not present

## 2019-07-25 DIAGNOSIS — S199XXA Unspecified injury of neck, initial encounter: Secondary | ICD-10-CM | POA: Diagnosis not present

## 2019-07-25 DIAGNOSIS — R29810 Facial weakness: Secondary | ICD-10-CM | POA: Diagnosis not present

## 2019-07-25 DIAGNOSIS — S0990XA Unspecified injury of head, initial encounter: Secondary | ICD-10-CM | POA: Diagnosis not present

## 2019-07-25 DIAGNOSIS — R410 Disorientation, unspecified: Secondary | ICD-10-CM | POA: Diagnosis not present

## 2019-07-25 DIAGNOSIS — Z743 Need for continuous supervision: Secondary | ICD-10-CM | POA: Diagnosis not present

## 2019-07-25 DIAGNOSIS — R531 Weakness: Secondary | ICD-10-CM | POA: Diagnosis not present

## 2019-07-25 NOTE — ED Notes (Signed)
[facility] contacted.

## 2019-07-25 NOTE — Discharge Instructions (Signed)
As discussed, your evaluation today has been largely reassuring.  But, it is important that you monitor your condition carefully, and do not hesitate to return to the ED if you develop new, or concerning changes in your condition. ? ?Otherwise, please follow-up with your physician for appropriate ongoing care. ? ?

## 2019-07-25 NOTE — ED Provider Notes (Signed)
[facility] DEPT Provider Note   CSN: [id number] Arrival date & time: 07/25/19  1927     History   Chief Complaint Chief Complaint  Patient presents with   Fall    Possible pt found sitting on the floor.    HPI Nicole Hess is a 73 y.o. female.     HPI Patient presents after a possible fall. Patient has dementia, limits the history somewhat, level 5 caveat secondary to this cognitive limitation. Per nursing home report the patient was found on the floor, without having obvious fall, trauma, exited by staff.  No reported change in interactivity, nor other remarkable findings. The patient himself seems to recall falling, but cannot describe how, why, what. She denies any pain, weakness, but again history is limited secondary to the patient's cognitive impairment. Past Medical History:  Diagnosis Date   Abnormal Pap smear    Anemia    Anxiety    Aortic stenosis    Bicuspid aortic valve    Coronary artery disease    Depression    Gout    Heart murmur    Heart valve problem    will have follow up Echo-Dr Jordan-will have replacement in a couple of years   Leukopenia    Seen by Dr. 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   History of colonic polyps 11/09/2016   S/P AVR (aortic valve replacement) 04/02/2015   Aortic stenosis due to bicuspid aortic valve 02/26/2015   Severe aortic stenosis 05/02/2014   Precordial pain  01/26/2013   Depression 03/30/2012   Hyperlipidemia 03/30/2012    Past Surgical History:  Procedure Laterality Date   AORTIC VALVE REPLACEMENT N/A 02/26/2015   Procedure: AORTIC VALVE REPLACEMENT (AVR);  Surgeon: Gaye Pollack, MD;  Location: [facility];  Service: Open Heart Surgery;  Laterality: N/A;   CARDIAC CATHETERIZATION N/A 01/29/2015   Procedure: Right/Left Heart Cath and Coronary Angiography;  Surgeon: Peter M Martinique, MD;  Location: [facility];  Service: Cardiovascular;  Laterality: N/A;   CERVICAL CONE BIOPSY     COLONOSCOPY  2010   DOPPLER ECHOCARDIOGRAPHY  3/14   GUM SURGERY     PARTIAL HYSTERECTOMY     TEE WITHOUT CARDIOVERSION N/A 02/26/2015   Procedure: TRANSESOPHAGEAL ECHOCARDIOGRAM (TEE);  Surgeon: Gaye Pollack, MD;  Location: [facility];  Service: Open Heart Surgery;  Laterality: N/A;   TUBAL LIGATION     VAGINAL HYSTERECTOMY     for dysplasia     OB History    Gravida  0   Para  0   Term  0   Preterm  0   AB  0   Living  0     SAB  0   TAB  0   Ectopic  0   Multiple  0   Live Births               Home Medications    Prior to  Admission medications   Medication Sig Start Date End Date Taking? Authorizing Provider  aspirin 81 MG tablet Take 81 mg by mouth daily.   Yes [provider]  fluticasone (FLONASE) 50 MCG/ACT nasal spray Place 1 spray into both nostrils daily.  05/11/19  Yes [provider]  memantine (NAMENDA) 10 MG tablet Take 1 tablet (10 mg total) by mouth 2 (two) times daily. 06/02/18  Yes Kathrynn Ducking, MD  pravastatin (PRAVACHOL) 40 MG tablet Take 1.5 tablets by mouth daily. 09/26/18  Yes [provider]  primidone (MYSOLINE) 250 MG tablet TAKE 1 TABLET BY MOUTH TWICE DAILY. Patient taking differently: Take 250 mg by mouth 2 (two) times daily.  05/17/18  Yes Kathrynn Ducking, MD  Psyllium (METAMUCIL FIBER PO) Take 1 Dose by mouth daily.   Yes [provider]  senna (SENOKOT)  8.6 MG TABS tablet Take 1 tablet by mouth daily as needed for mild constipation.   Yes [provider]  terconazole (TERAZOL 7) 0.4 % vaginal cream Place 1 applicator vaginally at bedtime. Apply small amount of cream to inner labia major twice daily.  Use vaginally and topically for 7 days. Patient not taking: Reported on 07/25/2019 07/04/19   Megan Salon, MD    Family History Family History  Problem Relation Age of Onset   Multiple myeloma Mother    Stroke Father    Hypertension Father    Heart disease Father 70   Kidney disease Father    Cancer Father    Cancer Maternal Grandmother        renal   Osteoarthritis Brother     Social History Social History   Tobacco Use   Smoking status: Never Smoker   Smokeless tobacco: Never Used  Substance Use Topics   Alcohol use: Not Currently   Drug use: No     Allergies   Dextromethorphan, Pseudoephedrine, and Penicillins   Review of Systems Review of Systems  Unable to perform ROS: Dementia     Physical Exam Updated Vital Signs BP 118/65 (BP Location: Right Arm)    Pulse 68    Temp 98.5 F (36.9 C) (Oral)    Resp 13    LMP 09/15/1987    SpO2 98%   Physical Exam Vitals signs and nursing note reviewed.  Constitutional:      General: She is not in acute distress.    Appearance: She is well-developed.  HENT:     Head: Normocephalic and atraumatic.  Eyes:     Conjunctiva/sclera: Conjunctivae normal.  Neck:     Comments: Cervical collar in place, no gross deformity. Cardiovascular:     Rate and Rhythm: Normal rate and regular rhythm.     Pulses: Normal pulses.  Pulmonary:     Effort: Pulmonary effort is normal. No respiratory distress.     Breath sounds: Normal breath sounds. No stridor.  Abdominal:     General: There is no distension.  Musculoskeletal:     Comments: No gross deformities, patient moves both hips, spontaneously, to command, without any discomfort, moves both arms spontaneously  as well, grip strength is equal bilaterally.  Skin:    General: Skin is warm and dry.  Neurological:     Mental Status: She is alert.     Cranial Nerves: No cranial nerve deficit.     Motor: Atrophy present.     Comments: Patient follows commands appropriately, briefly, speech is brief, the patient is withdrawn, and is seemingly appropriate.  Psychiatric:  Behavior: Behavior is withdrawn.        Cognition and Memory: Cognition is impaired.      ED Treatments / Results   Radiology Ct Head Wo Contrast  Result Date: 07/25/2019 CLINICAL DATA:  Fall and dementia EXAM: CT CERVICAL SPINE WITHOUT CONTRAST TECHNIQUE: Multidetector CT imaging of the cervical spine was performed without intravenous contrast. Multiplanar CT image reconstructions were also generated. COMPARISON:  May 03, 2018 FINDINGS: Brain: No evidence of acute territorial infarction, hemorrhage, hydrocephalus,extra-axial collection or mass lesion/mass effect. There is mild dilatation the ventricles and sulci consistent with age-related atrophy. Low-attenuation changes in the deep white matter consistent with small vessel ischemia. Vascular: No hyperdense vessel or unexpected calcification. Skull: The skull is intact. No fracture or focal lesion identified. Sinuses/Orbits: Mild mucosal thickening seen within the left maxillary sinus. The orbits and globes intact. Other: None Cervical spine: Alignment: There is straightening of the normal cervical lordosis. Skull base and vertebrae: Visualized skull base is intact. No atlanto-occipital dissociation. The vertebral body heights are well maintained. No fracture or pathologic osseous lesion seen. Soft tissues and spinal canal: The visualized paraspinal soft tissues are unremarkable. No prevertebral soft tissue swelling is seen. The spinal canal is grossly unremarkable, no large epidural collection or significant canal narrowing. Disc levels: There is mild disc height loss with anterior  osteophytes and minimal disc osteophyte complex at C5-C6. There is mild neural foraminal narrowing.6 Upper chest: The lung apices are clear. Thoracic inlet is within normal limits. Other: None IMPRESSION: 1.  No acute intracranial abnormality. 2. Findings consistent with mild age related atrophy and chronic small vessel ischemia 3. Mild left maxillary sinus mucosal thickening. 4. No acute fracture or malalignment of the spine. Electronically Signed   By: Prudencio Pair M.D.   On: 07/25/2019 21:13   Ct Cervical Spine Wo Contrast  Result Date: 07/25/2019 CLINICAL DATA:  Fall and dementia EXAM: CT CERVICAL SPINE WITHOUT CONTRAST TECHNIQUE: Multidetector CT imaging of the cervical spine was performed without intravenous contrast. Multiplanar CT image reconstructions were also generated. COMPARISON:  May 03, 2018 FINDINGS: Brain: No evidence of acute territorial infarction, hemorrhage, hydrocephalus,extra-axial collection or mass lesion/mass effect. There is mild dilatation the ventricles and sulci consistent with age-related atrophy. Low-attenuation changes in the deep white matter consistent with small vessel ischemia. Vascular: No hyperdense vessel or unexpected calcification. Skull: The skull is intact. No fracture or focal lesion identified. Sinuses/Orbits: Mild mucosal thickening seen within the left maxillary sinus. The orbits and globes intact. Other: None Cervical spine: Alignment: There is straightening of the normal cervical lordosis. Skull base and vertebrae: Visualized skull base is intact. No atlanto-occipital dissociation. The vertebral body heights are well maintained. No fracture or pathologic osseous lesion seen. Soft tissues and spinal canal: The visualized paraspinal soft tissues are unremarkable. No prevertebral soft tissue swelling is seen. The spinal canal is grossly unremarkable, no large epidural collection or significant canal narrowing. Disc levels: There is mild disc height loss with  anterior osteophytes and minimal disc osteophyte complex at C5-C6. There is mild neural foraminal narrowing.6 Upper chest: The lung apices are clear. Thoracic inlet is within normal limits. Other: None IMPRESSION: 1.  No acute intracranial abnormality. 2. Findings consistent with mild age related atrophy and chronic small vessel ischemia 3. Mild left maxillary sinus mucosal thickening. 4. No acute fracture or malalignment of the spine. Electronically Signed   By: Prudencio Pair M.D.   On: 07/25/2019 21:13    Procedures Procedures (including critical care time)  Medications Ordered in ED Medications - No data to display   Initial Impression / Assessment and Plan / ED Course  I have reviewed the triage vital signs and the nursing notes.  Pertinent labs & imaging results that were available during my care of the patient were reviewed by me and considered in my medical decision making (see chart for details).  This elderly female with dementia presents after a possible fall. Here she is awake, alert, hemodynamically unremarkable, afebrile. With the unclear circumstances around the fall, and her arrival in the cervical spine collar, consideration of occult injury is made, CT scans performed, both of which were reassuring. With no distal neurovascular compromise, the patient was discharged back to her nursing facility.  Final Clinical Impressions(s) / ED Diagnoses   Final diagnoses:  Fall, initial encounter     Carmin Muskrat, MD 07/25/19 2139

## 2019-07-25 NOTE — ED Triage Notes (Signed)
Per EMS, Pt is coming from [facility] assisted [facility]. Pt has a hx of dementia. Staff found pt sitting in bathroom floor. Unsure if there was fall, no evidence of trauma. Staff states that she has been steadily becoming more confused over the course several weeks now, but not an acute change and believed to be progression of dementia according to POA. No signs of neurological changes.

## 2019-07-31 DIAGNOSIS — Z9181 History of falling: Secondary | ICD-10-CM | POA: Diagnosis not present

## 2019-07-31 DIAGNOSIS — J301 Allergic rhinitis due to pollen: Secondary | ICD-10-CM | POA: Diagnosis not present

## 2019-07-31 DIAGNOSIS — N3946 Mixed incontinence: Secondary | ICD-10-CM | POA: Diagnosis not present

## 2019-07-31 DIAGNOSIS — G301 Alzheimer's disease with late onset: Secondary | ICD-10-CM | POA: Diagnosis not present

## 2019-08-01 ENCOUNTER — Other Ambulatory Visit: Payer: Self-pay

## 2019-08-01 ENCOUNTER — Ambulatory Visit (INDEPENDENT_AMBULATORY_CARE_PROVIDER_SITE_OTHER): Payer: PPO | Admitting: Obstetrics & Gynecology

## 2019-08-01 ENCOUNTER — Encounter: Payer: Self-pay | Admitting: Obstetrics & Gynecology

## 2019-08-01 VITALS — BP 110/70 | HR 72 | Temp 96.4°F | Resp 14 | Wt 131.6 lb

## 2019-08-01 DIAGNOSIS — Z01419 Encounter for gynecological examination (general) (routine) without abnormal findings: Secondary | ICD-10-CM

## 2019-08-01 NOTE — Progress Notes (Signed)
73 y.o. G0P0000 Single White or Caucasian female here for annual exam.  She is accompanied by a care giver from assisted living facility.  Denies vaginal bleeding.  Pt is very confused today.  Has not had any vaginal bleeding.  Is using estrogen cream.  Is not complaining of vaginal irritation/itching any longer.  Patient's last menstrual period was 09/15/1987.          Sexually active: No.  The current method of family planning is status post hysterectomy.    Exercising: No.  The patient does not participate in regular exercise at present. Smoker:  no  Health Maintenance: Pap:  09/17/15 Neg             04/20/13 Neg  History of abnormal Pap:  Yes, remote hx of dysplasia MMG:  August 2019 BIRADS 1 negative Colonoscopy:  10/23/13 polyp. F/u 5 years. BMD:   11/10/18 Osteopenia TDaP:  07/06/17 Pneumonia vaccine(s):  completed Shingrix:   Zostavax 2017 Hep C testing: 05/13/16 Neg Screening Labs: PCP   reports that she has never smoked. She has never used smokeless tobacco. She reports previous alcohol use. She reports that she does not use drugs.  Past Medical History:  Diagnosis Date  . Abnormal Pap smear   . Anemia   . Anxiety   . Aortic stenosis   . Bicuspid aortic valve   . Coronary artery disease   . Depression   . Gout   . Heart murmur   . Heart valve problem    will have follow up Echo-Dr Jordan-will have replacement in a couple of years  . Leukopenia    Seen by Dr. Lamonte Sakai, present since 2008, watchful waiting  . Memory difficulty 04/29/2017  . Osteopenia   . Seizures ([place])    as a child  . Shortness of breath dyspnea   . Vertigo 04/29/2017  . Vertigo     Past Surgical History:  Procedure Laterality Date  . AORTIC VALVE REPLACEMENT N/A 02/26/2015   Procedure: AORTIC VALVE REPLACEMENT (AVR);  Surgeon: Gaye Pollack, MD;  Location: [facility];  Service: Open Heart Surgery;  Laterality: N/A;  . CARDIAC CATHETERIZATION N/A 01/29/2015   Procedure: Right/Left Heart Cath and Coronary  Angiography;  Surgeon: Peter M Martinique, MD;  Location: [facility];  Service: Cardiovascular;  Laterality: N/A;  . CERVICAL CONE BIOPSY    . COLONOSCOPY  2010  . DOPPLER ECHOCARDIOGRAPHY  3/14  . GUM SURGERY    . PARTIAL HYSTERECTOMY    . TEE WITHOUT CARDIOVERSION N/A 02/26/2015   Procedure: TRANSESOPHAGEAL ECHOCARDIOGRAM (TEE);  Surgeon: Gaye Pollack, MD;  Location: [facility];  Service: Open Heart Surgery;  Laterality: N/A;  . TUBAL LIGATION    . VAGINAL HYSTERECTOMY     for dysplasia    Current Outpatient Medications  Medication Sig Dispense Refill  . aspirin 81 MG tablet Take 81 mg by mouth daily.    . fluticasone (FLONASE) 50 MCG/ACT nasal spray Place 1 spray into both nostrils daily.     Marland Kitchen loratadine (CLARITIN) 10 MG tablet Take 10 mg by mouth as needed for allergies.    . memantine (NAMENDA) 10 MG tablet Take 1 tablet (10 mg total) by mouth 2 (two) times daily. 60 tablet 5  . pravastatin (PRAVACHOL) 40 MG tablet Take 1.5 tablets by mouth daily.    . primidone (MYSOLINE) 250 MG tablet TAKE 1 TABLET BY MOUTH TWICE DAILY. (Patient taking differently: Take 250 mg by mouth 2 (two) times daily. )  60 tablet 1  . Psyllium (METAMUCIL FIBER PO) Take 1 Dose by mouth daily.    Marland Kitchen senna (SENOKOT) 8.6 MG TABS tablet Take 1 tablet by mouth daily as needed for mild constipation.    Marland Kitchen terconazole (TERAZOL 7) 0.4 % vaginal cream Place 1 applicator vaginally at bedtime. Apply small amount of cream to inner labia major twice daily.  Use vaginally and topically for 7 days. 45 g 0   No current facility-administered medications for this visit.     Family History  Problem Relation Age of Onset  . Multiple myeloma Mother   . Stroke Father   . Hypertension Father   . Heart disease Father 76  . Kidney disease Father   . Cancer Father   . Cancer Maternal Grandmother        renal  . Osteoarthritis Brother     Review of Systems  All other systems reviewed and are negative.   Exam:   BP 110/70  (BP Location: Left Arm, Patient Position: Sitting, Cuff Size: Normal)   Pulse 72   Temp (!) 96.4 F (35.8 C) (Temporal)   Resp 14   Wt 131 lb 9.6 oz (59.7 kg)   LMP 09/15/1987   BMI 20.01 kg/m   Height:      Ht Readings from Last 3 Encounters:  10/18/18 5' 8"  (1.727 m)  05/23/18 5' 8"  (1.727 m)  05/19/18 5' 8"  (1.727 m)    General appearance: alert, cooperative and appears stated age Head: Normocephalic, without obvious abnormality, atraumatic Neck: no adenopathy, supple, symmetrical, trachea midline and thyroid normal to inspection and palpation Lungs: clear to auscultation bilaterally Breasts: normal appearance, no masses or tenderness Heart: regular rate and rhythm Abdomen: soft, non-tender; bowel sounds normal; no masses,  no organomegaly Extremities: extremities normal, atraumatic, no cyanosis or edema Skin: Skin color, texture, turgor normal. No rashes or lesions Lymph nodes: Cervical, supraclavicular, and axillary nodes normal. No abnormal inguinal nodes palpated Neurologic: Grossly normal   Pelvic: External genitalia:  no lesions              Urethra:  normal appearing urethra with no masses, tenderness or lesions              Bartholins and Skenes: normal                 Vagina: normal appearing vagina with normal color and discharge, no lesions              Cervix: absent              Pap taken: No. Bimanual Exam:  Uterus:  uterus absent              Adnexa: no mass, fullness, tenderness               Rectovaginal: Confirms               Anus:  normal sphincter tone, no lesions  Chaperone was present for exam.  A:  Well Woman with normal exam PMP, no HRT Remote hx of cervical dysplasia, s/p TVH H/o laparoscopic BSO 9/08 Osteopenia H/o gout H/o aortic valve repair 6/16  P:   Mammogram guidelines reviewed.  I feel ok to stop MMGs at this point. pap smear not indicated Will communicate with POA about colonoscopy guidelines.  Do not feel this needs to be done  at this time No lab work obtained Continue small amt of estrogen vaginal cream in on inner labia  majors twice weekly. return annually or prn

## 2019-08-16 DIAGNOSIS — Z03818 Encounter for observation for suspected exposure to other biological agents ruled out: Secondary | ICD-10-CM | POA: Diagnosis not present

## 2019-08-21 ENCOUNTER — Encounter (HOSPITAL_COMMUNITY): Payer: Self-pay | Admitting: Emergency Medicine

## 2019-08-21 ENCOUNTER — Emergency Department (HOSPITAL_COMMUNITY): Payer: PPO

## 2019-08-21 ENCOUNTER — Other Ambulatory Visit: Payer: Self-pay

## 2019-08-21 ENCOUNTER — Emergency Department (HOSPITAL_COMMUNITY)
Admission: EM | Admit: 2019-08-21 | Discharge: 2019-08-21 | Disposition: A | Discharge status: Skilled Nursing Facility | Payer: PPO | Source: Home / Self Care | Attending: Emergency Medicine | Admitting: Emergency Medicine

## 2019-08-21 DIAGNOSIS — Z841 Family history of disorders of kidney and ureter: Secondary | ICD-10-CM | POA: Diagnosis not present

## 2019-08-21 DIAGNOSIS — R7989 Other specified abnormal findings of blood chemistry: Secondary | ICD-10-CM | POA: Diagnosis not present

## 2019-08-21 DIAGNOSIS — Z8249 Family history of ischemic heart disease and other diseases of the circulatory system: Secondary | ICD-10-CM | POA: Diagnosis not present

## 2019-08-21 DIAGNOSIS — R8271 Bacteriuria: Secondary | ICD-10-CM | POA: Diagnosis present

## 2019-08-21 DIAGNOSIS — R5383 Other fatigue: Secondary | ICD-10-CM | POA: Diagnosis not present

## 2019-08-21 DIAGNOSIS — R4182 Altered mental status, unspecified: Secondary | ICD-10-CM | POA: Diagnosis not present

## 2019-08-21 DIAGNOSIS — Z7401 Bed confinement status: Secondary | ICD-10-CM | POA: Diagnosis not present

## 2019-08-21 DIAGNOSIS — I2510 Atherosclerotic heart disease of native coronary artery without angina pectoris: Secondary | ICD-10-CM | POA: Diagnosis present

## 2019-08-21 DIAGNOSIS — Z7982 Long term (current) use of aspirin: Secondary | ICD-10-CM | POA: Diagnosis not present

## 2019-08-21 DIAGNOSIS — I959 Hypotension, unspecified: Secondary | ICD-10-CM | POA: Diagnosis not present

## 2019-08-21 DIAGNOSIS — M2550 Pain in unspecified joint: Secondary | ICD-10-CM | POA: Diagnosis not present

## 2019-08-21 DIAGNOSIS — R404 Transient alteration of awareness: Secondary | ICD-10-CM | POA: Diagnosis not present

## 2019-08-21 DIAGNOSIS — F0390 Unspecified dementia without behavioral disturbance: Secondary | ICD-10-CM | POA: Diagnosis present

## 2019-08-21 DIAGNOSIS — R1013 Epigastric pain: Secondary | ICD-10-CM | POA: Diagnosis present

## 2019-08-21 DIAGNOSIS — R0902 Hypoxemia: Secondary | ICD-10-CM | POA: Diagnosis not present

## 2019-08-21 DIAGNOSIS — E785 Hyperlipidemia, unspecified: Secondary | ICD-10-CM | POA: Diagnosis present

## 2019-08-21 DIAGNOSIS — E876 Hypokalemia: Secondary | ICD-10-CM | POA: Diagnosis present

## 2019-08-21 DIAGNOSIS — Z66 Do not resuscitate: Secondary | ICD-10-CM | POA: Diagnosis present

## 2019-08-21 DIAGNOSIS — U071 COVID-19: Secondary | ICD-10-CM | POA: Insufficient documentation

## 2019-08-21 DIAGNOSIS — R42 Dizziness and giddiness: Secondary | ICD-10-CM | POA: Diagnosis not present

## 2019-08-21 DIAGNOSIS — Z743 Need for continuous supervision: Secondary | ICD-10-CM | POA: Diagnosis not present

## 2019-08-21 DIAGNOSIS — R410 Disorientation, unspecified: Secondary | ICD-10-CM | POA: Diagnosis not present

## 2019-08-21 DIAGNOSIS — F29 Unspecified psychosis not due to a substance or known physiological condition: Secondary | ICD-10-CM | POA: Diagnosis not present

## 2019-08-21 DIAGNOSIS — M79674 Pain in right toe(s): Secondary | ICD-10-CM | POA: Diagnosis not present

## 2019-08-21 DIAGNOSIS — R05 Cough: Secondary | ICD-10-CM | POA: Diagnosis not present

## 2019-08-21 DIAGNOSIS — E8809 Other disorders of plasma-protein metabolism, not elsewhere classified: Secondary | ICD-10-CM | POA: Diagnosis present

## 2019-08-21 DIAGNOSIS — R600 Localized edema: Secondary | ICD-10-CM | POA: Diagnosis present

## 2019-08-21 DIAGNOSIS — R5381 Other malaise: Secondary | ICD-10-CM | POA: Diagnosis not present

## 2019-08-21 DIAGNOSIS — R52 Pain, unspecified: Secondary | ICD-10-CM | POA: Diagnosis not present

## 2019-08-21 DIAGNOSIS — R1084 Generalized abdominal pain: Secondary | ICD-10-CM | POA: Diagnosis not present

## 2019-08-21 DIAGNOSIS — D61818 Other pancytopenia: Secondary | ICD-10-CM | POA: Diagnosis present

## 2019-08-21 DIAGNOSIS — Z79899 Other long term (current) drug therapy: Secondary | ICD-10-CM | POA: Insufficient documentation

## 2019-08-21 DIAGNOSIS — G40909 Epilepsy, unspecified, not intractable, without status epilepticus: Secondary | ICD-10-CM | POA: Diagnosis present

## 2019-08-21 DIAGNOSIS — J1289 Other viral pneumonia: Secondary | ICD-10-CM | POA: Diagnosis present

## 2019-08-21 DIAGNOSIS — R209 Unspecified disturbances of skin sensation: Secondary | ICD-10-CM | POA: Diagnosis not present

## 2019-08-21 DIAGNOSIS — I248 Other forms of acute ischemic heart disease: Secondary | ICD-10-CM | POA: Diagnosis present

## 2019-08-21 DIAGNOSIS — F329 Major depressive disorder, single episode, unspecified: Secondary | ICD-10-CM | POA: Diagnosis not present

## 2019-08-21 DIAGNOSIS — Z9181 History of falling: Secondary | ICD-10-CM | POA: Diagnosis not present

## 2019-08-21 DIAGNOSIS — R0981 Nasal congestion: Secondary | ICD-10-CM

## 2019-08-21 DIAGNOSIS — R339 Retention of urine, unspecified: Secondary | ICD-10-CM | POA: Diagnosis present

## 2019-08-21 DIAGNOSIS — Z823 Family history of stroke: Secondary | ICD-10-CM | POA: Diagnosis not present

## 2019-08-21 DIAGNOSIS — Z952 Presence of prosthetic heart valve: Secondary | ICD-10-CM | POA: Diagnosis not present

## 2019-08-21 DIAGNOSIS — R279 Unspecified lack of coordination: Secondary | ICD-10-CM | POA: Diagnosis not present

## 2019-08-21 DIAGNOSIS — E872 Acidosis: Secondary | ICD-10-CM | POA: Diagnosis present

## 2019-08-21 DIAGNOSIS — M7989 Other specified soft tissue disorders: Secondary | ICD-10-CM | POA: Diagnosis not present

## 2019-08-21 LAB — SARS CORONAVIRUS 2 (TAT 6-24 HRS): SARS Coronavirus 2: POSITIVE — AB

## 2019-08-21 MED ORDER — FLUTICASONE PROPIONATE 50 MCG/ACT NA SUSP: 1.0000 | Freq: Every day | NASAL | 0 refills | Status: DC

## 2019-08-21 NOTE — ED Triage Notes (Signed)
Per EMS pt sent for evaluation for nasal congestion and cough; denies pain or other; pt hx of dementia. COVID exposure within facility.

## 2019-08-21 NOTE — ED Notes (Addendum)
Patient came from [facility] unit

## 2019-08-21 NOTE — ED Notes (Signed)
[facility] called for transport.  

## 2019-08-21 NOTE — ED Provider Notes (Signed)
[facility] DEPT Provider Note   CSN: [id number] Arrival date & time: 08/21/19  0849     History   Chief Complaint Chief Complaint  Patient presents with  . Cough  . Nasal Congestion    HPI GENELDA ROARK is a 73 y.o. female.     HPI   73 year old female with cough and nasal congestion.  She has a history of dementia and is on the most reliable historian.  She did not offer any specific complaints when asked and stated that she felt fine.  When specifically asked though she does say she has had a runny nose for couple days.  Denies any acute pain.  Denies feeling short of breath.  She does not feel like she has had a fever.  Apparently contacts at her facility that have tested positive for COVID.   Past Medical History:  Diagnosis Date  . Abnormal Pap smear   . Anemia   . Anxiety   . Aortic stenosis   . Bicuspid aortic valve   . Coronary artery disease   . Depression   . Gout   . Heart murmur   . Heart valve problem    will have follow up Echo-Dr Jordan-will have replacement in a couple of years  . Leukopenia    Seen by Dr. Lamonte Sakai, present since 2008, watchful waiting  . Memory difficulty 04/29/2017  . Osteopenia   . Seizures ( [employer])    as a child  . Shortness of breath dyspnea   . Vertigo 04/29/2017  . Vertigo    Patient Active Problem List   Diagnosis Date Noted  . Overactive bladder 05/20/2018  . Seasonal allergic rhinitis due to pollen 05/20/2018  . Vertigo 04/29/2017  . Memory difficulty 04/29/2017  . Dementia without behavioral disturbance ([place]) 02/06/2017  . 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 History of colonic polyps 11/09/2016  . S/P AVR (aortic valve replacement) 04/02/2015  . Aortic stenosis due to bicuspid aortic valve 02/26/2015  . Severe aortic stenosis 05/02/2014  . Precordial pain 01/26/2013  . Depression 03/30/2012  . Hyperlipidemia 03/30/2012    Past Surgical History:  Procedure Laterality Date  . AORTIC VALVE REPLACEMENT N/A 02/26/2015   Procedure: AORTIC VALVE REPLACEMENT (AVR);  Surgeon: Gaye Pollack, MD;  Location: [facility];  Service: Open Heart Surgery;  Laterality: N/A;  . CARDIAC CATHETERIZATION N/A 01/29/2015   Procedure: Right/Left Heart Cath and Coronary Angiography;  Surgeon: Peter M Martinique, MD;  Location: [facility];  Service: Cardiovascular;  Laterality: N/A;  . CERVICAL CONE BIOPSY    . COLONOSCOPY  2010  . DOPPLER ECHOCARDIOGRAPHY  3/14  . GUM SURGERY    . PARTIAL HYSTERECTOMY    . TEE WITHOUT CARDIOVERSION N/A 02/26/2015   Procedure: TRANSESOPHAGEAL ECHOCARDIOGRAM (TEE);  Surgeon: Gaye Pollack, MD;  Location: [facility];  Service: Open Heart Surgery;  Laterality: N/A;  . TUBAL LIGATION    . VAGINAL HYSTERECTOMY     for dysplasia    OB History    Gravida  0   Para  0   Term  0   Preterm  0   AB  0   Living  0     SAB  0   TAB  0   Ectopic  0   Multiple  0   Live Births             Home Medications  Prior to Admission medications   Medication Sig Start Date End Date Taking? Authorizing Provider  aspirin 81 MG tablet Take 81 mg by mouth daily.    [provider]  fluticasone (FLONASE) 50 MCG/ACT nasal spray Place 1 spray into both nostrils daily.  05/11/19   [provider]  loratadine (CLARITIN) 10 MG tablet Take 10 mg by mouth as needed for allergies.    [provider]  memantine (NAMENDA) 10 MG tablet Take 1 tablet (10 mg total) by mouth 2 (two) times daily. 06/02/18   Kathrynn Ducking, MD  pravastatin (PRAVACHOL) 40 MG tablet Take 1.5 tablets by mouth daily. 09/26/18   [provider]  primidone (MYSOLINE) 250 MG tablet TAKE 1 TABLET BY MOUTH TWICE DAILY. Patient taking differently: Take 250 mg by mouth 2 (two) times daily.  05/17/18   Kathrynn Ducking, MD  Psyllium (METAMUCIL FIBER PO) Take 1 Dose by mouth daily.    [provider]   senna (SENOKOT) 8.6 MG TABS tablet Take 1 tablet by mouth daily as needed for mild constipation.    [provider]  terconazole (TERAZOL 7) 0.4 % vaginal cream Place 1 applicator vaginally at bedtime. Apply small amount of cream to inner labia major twice daily.  Use vaginally and topically for 7 days. 07/04/19   Megan Salon, MD   Family History Family History  Problem Relation Age of Onset  . Multiple myeloma Mother   . Stroke Father   . Hypertension Father   . Heart disease Father 76  . Kidney disease Father   . Cancer Father   . Cancer Maternal Grandmother        renal  . Osteoarthritis Brother    Social History Social History   Tobacco Use  . Smoking status: Never Smoker  . Smokeless tobacco: Never Used  Substance Use Topics  . Alcohol use: Not Currently  . Drug use: No   Allergies   Dextromethorphan, Pseudoephedrine, and Penicillins  Review of Systems Review of Systems  All systems reviewed and negative, other than as noted in HPI.  Physical Exam Updated Vital Signs BP 100/64   Pulse 66   Temp 98.6 F (37 C) (Oral)   Resp 16   LMP 09/15/1987   SpO2 97%   Physical Exam Vitals signs and nursing note reviewed.  Constitutional:      General: She is not in acute distress.    Appearance: She is well-developed.  HENT:     Head: Normocephalic and atraumatic.     Mouth/Throat:     Comments: Some white material on lips. Wipes away. (dried toothpaste?) Oropharynx clear.  Eyes:     General:        Right eye: No discharge.        Left eye: No discharge.     Conjunctiva/sclera: Conjunctivae normal.  Neck:     Musculoskeletal: Neck supple.  Cardiovascular:     Rate and Rhythm: Normal rate and regular rhythm.     Heart sounds: Normal heart sounds. No murmur. No friction rub. No gallop.   Pulmonary:     Effort: Pulmonary effort is normal. No respiratory distress.     Breath sounds: Normal breath sounds.  Abdominal:     General: There is no  distension.     Palpations: Abdomen is soft.     Tenderness: There is no abdominal tenderness.  Musculoskeletal:        General: No tenderness.  Skin:  General: Skin is warm and dry.  Neurological:     Mental Status: She is alert.  Psychiatric:        Behavior: Behavior normal.        Thought Content: Thought content normal.      ED Treatments / Results  Labs (all labs ordered are listed, but only abnormal results are displayed) Labs Reviewed  SARS CORONAVIRUS 2 (TAT 6-24 HRS) - Abnormal; Notable for the following components:      Result Value   SARS Coronavirus 2 POSITIVE (*)    All other components within normal limits    EKG None  Radiology Dg Chest Portable 1 View  Result Date: 08/21/2019 CLINICAL DATA:  Weakness, cough, recent COVID-19 exposure EXAM: PORTABLE CHEST 1 VIEW COMPARISON:  Portable exam 0925 hours compared to 12/19/2016 FINDINGS: Normal heart size post AVR. Mediastinal contours and pulmonary vascularity normal. Lungs clear. No pleural effusion or pneumothorax. Bones demineralized. IMPRESSION: No acute abnormalities. Electronically Signed   By: Lavonia Dana M.D.   On: 08/21/2019 09:55    Procedures Procedures (including critical care time)  Medications Ordered in ED Medications - No data to display   Initial Impression / Assessment and Plan / ED Course  I have reviewed the triage vital signs and the nursing notes.  Pertinent labs & imaging results that were available during my care of the patient were reviewed by me and considered in my medical decision making (see chart for details).     73yF with congestion. She has little in terms of complaints but also doesn't seem like the greatest historian. Afebrile. Generally appears well. O2 sats fine on RA. CXR w/o acute process. I'm not too concerned about emergent etiology congestion. Sinusitis? In current climate though we will check COVID. Regardless, I think she is appropriate for DC.   ADITHI GAMMON was evaluated in Emergency Department on 08/21/2019 for the symptoms described in the history of present illness. She was evaluated in the context of the global COVID-19 pandemic, which necessitated consideration that the patient might be at risk for infection with the SARS-CoV-2 virus that causes COVID-19. Institutional protocols and algorithms that pertain to the evaluation of patients at risk for COVID-19 are in a state of rapid change based on information released by regulatory bodies including the CDC and federal and state organizations. These policies and algorithms were followed during the patient's care in the ED.   Final Clinical Impressions(s) / ED Diagnoses   Final diagnoses:  Nasal congestion    ED Discharge Orders    None       Virgel Manifold, MD 08/22/19 [id number]

## 2019-08-22 ENCOUNTER — Telehealth: Payer: Self-pay | Admitting: Unknown Physician Specialty

## 2019-08-22 NOTE — Telephone Encounter (Signed)
Called to discuss with patient about Covid symptoms and the use of bamlanivimab, a monoclonal antibody infusion for those with mild to moderate Covid symptoms and at a high risk of hospitalization.  Pt is qualified for this infusion at the [facility] infusion center due to Age > 65   Left message to call back

## 2019-08-23 ENCOUNTER — Emergency Department (HOSPITAL_COMMUNITY): Payer: PPO

## 2019-08-23 ENCOUNTER — Other Ambulatory Visit: Payer: Self-pay

## 2019-08-23 ENCOUNTER — Encounter (HOSPITAL_COMMUNITY): Payer: Self-pay | Admitting: Emergency Medicine

## 2019-08-23 ENCOUNTER — Inpatient Hospital Stay (HOSPITAL_COMMUNITY)
Admission: EM | Admit: 2019-08-23 | Discharge: 2019-08-30 | DRG: 177 | Disposition: A | Discharge status: Home Health | Payer: PPO | Attending: Family Medicine | Admitting: Family Medicine

## 2019-08-23 DIAGNOSIS — F32A Depression, unspecified: Secondary | ICD-10-CM | POA: Diagnosis present

## 2019-08-23 DIAGNOSIS — E876 Hypokalemia: Secondary | ICD-10-CM

## 2019-08-23 DIAGNOSIS — E872 Acidosis: Secondary | ICD-10-CM | POA: Diagnosis present

## 2019-08-23 DIAGNOSIS — Z823 Family history of stroke: Secondary | ICD-10-CM

## 2019-08-23 DIAGNOSIS — E8809 Other disorders of plasma-protein metabolism, not elsewhere classified: Secondary | ICD-10-CM | POA: Diagnosis present

## 2019-08-23 DIAGNOSIS — F0390 Unspecified dementia without behavioral disturbance: Secondary | ICD-10-CM

## 2019-08-23 DIAGNOSIS — Z8249 Family history of ischemic heart disease and other diseases of the circulatory system: Secondary | ICD-10-CM

## 2019-08-23 DIAGNOSIS — Z79899 Other long term (current) drug therapy: Secondary | ICD-10-CM

## 2019-08-23 DIAGNOSIS — R339 Retention of urine, unspecified: Secondary | ICD-10-CM | POA: Diagnosis present

## 2019-08-23 DIAGNOSIS — I2510 Atherosclerotic heart disease of native coronary artery without angina pectoris: Secondary | ICD-10-CM | POA: Diagnosis present

## 2019-08-23 DIAGNOSIS — Z952 Presence of prosthetic heart valve: Secondary | ICD-10-CM

## 2019-08-23 DIAGNOSIS — Z9181 History of falling: Secondary | ICD-10-CM

## 2019-08-23 DIAGNOSIS — I248 Other forms of acute ischemic heart disease: Secondary | ICD-10-CM | POA: Diagnosis present

## 2019-08-23 DIAGNOSIS — R1013 Epigastric pain: Secondary | ICD-10-CM | POA: Diagnosis present

## 2019-08-23 DIAGNOSIS — F329 Major depressive disorder, single episode, unspecified: Secondary | ICD-10-CM

## 2019-08-23 DIAGNOSIS — Z7982 Long term (current) use of aspirin: Secondary | ICD-10-CM

## 2019-08-23 DIAGNOSIS — R600 Localized edema: Secondary | ICD-10-CM | POA: Diagnosis present

## 2019-08-23 DIAGNOSIS — Z841 Family history of disorders of kidney and ureter: Secondary | ICD-10-CM

## 2019-08-23 DIAGNOSIS — D61818 Other pancytopenia: Secondary | ICD-10-CM | POA: Diagnosis present

## 2019-08-23 DIAGNOSIS — E785 Hyperlipidemia, unspecified: Secondary | ICD-10-CM

## 2019-08-23 DIAGNOSIS — R8271 Bacteriuria: Secondary | ICD-10-CM | POA: Diagnosis present

## 2019-08-23 DIAGNOSIS — Z807 Family history of other malignant neoplasms of lymphoid, hematopoietic and related tissues: Secondary | ICD-10-CM

## 2019-08-23 DIAGNOSIS — Z66 Do not resuscitate: Secondary | ICD-10-CM | POA: Diagnosis present

## 2019-08-23 DIAGNOSIS — J1289 Other viral pneumonia: Secondary | ICD-10-CM | POA: Diagnosis present

## 2019-08-23 DIAGNOSIS — Z880 Allergy status to penicillin: Secondary | ICD-10-CM

## 2019-08-23 DIAGNOSIS — U071 COVID-19: Principal | ICD-10-CM

## 2019-08-23 DIAGNOSIS — G40909 Epilepsy, unspecified, not intractable, without status epilepticus: Secondary | ICD-10-CM | POA: Diagnosis present

## 2019-08-23 LAB — COMPREHENSIVE METABOLIC PANEL
ALT: 13 U/L (ref 0–44)
AST: 31 U/L (ref 15–41)
Albumin: 2.4 g/dL — ABNORMAL LOW (ref 3.5–5.0)
Alkaline Phosphatase: 84 U/L (ref 38–126)
Anion gap: 9 (ref 5–15)
BUN: 17 mg/dL (ref 8–23)
CO2: 17 mmol/L — ABNORMAL LOW (ref 22–32)
Calcium: 6.1 mg/dL — CL (ref 8.9–10.3)
Chloride: 118 mmol/L — ABNORMAL HIGH (ref 98–111)
Creatinine, Ser: 0.4 mg/dL — ABNORMAL LOW (ref 0.44–1.00)
GFR calc Af Amer: >60 mL/min (ref >60)
GFR calc non Af Amer: >60 mL/min (ref >60)
Glucose, Bld: 68 mg/dL — ABNORMAL LOW (ref 70–99)
Potassium: 3.1 mmol/L — ABNORMAL LOW (ref 3.5–5.1)
Sodium: 144 mmol/L (ref 135–145)
Total Bilirubin: 0.8 mg/dL (ref 0.3–1.2)
Total Protein: 4.9 g/dL — ABNORMAL LOW (ref 6.5–8.1)

## 2019-08-23 LAB — CBC
HCT: 33.1 % — ABNORMAL LOW (ref 36.0–46.0)
Hemoglobin: 10.5 g/dL — ABNORMAL LOW (ref 12.0–15.0)
MCH: 31.4 pg (ref 26.0–34.0)
MCHC: 31.7 g/dL (ref 30.0–36.0)
MCV: 99.1 fL (ref 80.0–100.0)
Platelets: 123 10*3/uL — ABNORMAL LOW (ref 150–400)
RBC: 3.34 MIL/uL — ABNORMAL LOW (ref 3.87–5.11)
RDW: 13.8 % (ref 11.5–15.5)
WBC: 2.7 10*3/uL — ABNORMAL LOW (ref 4.0–10.5)
nRBC: 0 % (ref 0.0–0.2)

## 2019-08-23 LAB — URINALYSIS, ROUTINE W REFLEX MICROSCOPIC
Bilirubin Urine: NEGATIVE
Glucose, UA: NEGATIVE mg/dL
Hgb urine dipstick: NEGATIVE
Ketones, ur: 5 mg/dL — AB
Nitrite: NEGATIVE
Protein, ur: NEGATIVE mg/dL
Specific Gravity, Urine: 1.036 — ABNORMAL HIGH (ref 1.005–1.030)
pH: 5 (ref 5.0–8.0)

## 2019-08-23 LAB — I-STAT CREATININE, ED: Creatinine, Ser: 0.3 mg/dL — ABNORMAL LOW (ref 0.44–1.00)

## 2019-08-23 LAB — C-REACTIVE PROTEIN: CRP: 7.3 mg/dL — ABNORMAL HIGH (ref <1.0)

## 2019-08-23 LAB — LACTATE DEHYDROGENASE: LDH: 225 U/L — ABNORMAL HIGH (ref 98–192)

## 2019-08-23 LAB — PHOSPHORUS: Phosphorus: 2.9 mg/dL (ref 2.5–4.6)

## 2019-08-23 LAB — MAGNESIUM: Magnesium: 1.9 mg/dL (ref 1.7–2.4)

## 2019-08-23 LAB — FERRITIN: Ferritin: 320 ng/mL — ABNORMAL HIGH (ref 11–307)

## 2019-08-23 LAB — PROCALCITONIN: Procalcitonin: <0.1 ng/mL

## 2019-08-23 LAB — LIPASE, BLOOD: Lipase: 20 U/L (ref 11–51)

## 2019-08-23 LAB — TROPONIN I (HIGH SENSITIVITY)
Troponin I (High Sensitivity): 25 ng/L — ABNORMAL HIGH (ref <18)
Troponin I (High Sensitivity): 31 ng/L — ABNORMAL HIGH (ref <18)

## 2019-08-23 LAB — D-DIMER, QUANTITATIVE: D-Dimer, Quant: 1.24 ug/mL-FEU — ABNORMAL HIGH (ref 0.00–0.50)

## 2019-08-23 LAB — FIBRINOGEN: Fibrinogen: 526 mg/dL — ABNORMAL HIGH (ref 210–475)

## 2019-08-23 LAB — VITAMIN D 25 HYDROXY (VIT D DEFICIENCY, FRACTURES): Vit D, 25-Hydroxy: 30.2 ng/mL (ref 30–100)

## 2019-08-23 MED ORDER — POTASSIUM CHLORIDE 10 MEQ/100ML IV SOLN
10.0000 meq | INTRAVENOUS | Status: AC
  Administered 2019-08-23 (×2): 10 meq via INTRAVENOUS
  Filled 2019-08-23 (×2): qty 100

## 2019-08-23 MED ORDER — SODIUM CHLORIDE 0.9 % IV SOLN
200.0000 mg | Freq: Once | INTRAVENOUS | Status: AC
  Administered 2019-08-24: 01:00:00 200 mg via INTRAVENOUS
  Filled 2019-08-23: qty 40

## 2019-08-23 MED ORDER — SODIUM CHLORIDE 0.9% FLUSH
3.0000 mL | Freq: Once | INTRAVENOUS | Status: AC
  Administered 2019-08-23: 3 mL via INTRAVENOUS

## 2019-08-23 MED ORDER — LORATADINE 10 MG PO TABS: 10.0000 mg | ORAL_TABLET | Freq: Every day | ORAL | Status: DC | PRN

## 2019-08-23 MED ORDER — ACETAMINOPHEN 325 MG PO TABS
650.0000 mg | ORAL_TABLET | Freq: Four times a day (QID) | ORAL | Status: DC | PRN
  Administered 2019-08-25: 650 mg via ORAL
  Filled 2019-08-23: qty 2

## 2019-08-23 MED ORDER — SODIUM CHLORIDE 0.9 % IV SOLN
100.0000 mg | Freq: Every day | INTRAVENOUS | Status: AC
  Administered 2019-08-24 – 2019-08-27 (×4): 100 mg via INTRAVENOUS
  Filled 2019-08-23: qty 20
  Filled 2019-08-23: qty 100
  Filled 2019-08-23 (×2): qty 20

## 2019-08-23 MED ORDER — HYDROCODONE-ACETAMINOPHEN 5-325 MG PO TABS
1.0000 | ORAL_TABLET | ORAL | Status: DC | PRN
  Administered 2019-08-27: 1 via ORAL
  Filled 2019-08-23: qty 1

## 2019-08-23 MED ORDER — SODIUM CHLORIDE 0.9 % IV SOLN
100.0000 mg | INTRAVENOUS | Status: DC
  Filled 2019-08-23: qty 20

## 2019-08-23 MED ORDER — SODIUM CHLORIDE 0.9 % IV SOLN
INTRAVENOUS | Status: DC
  Administered 2019-08-24 – 2019-08-26 (×2): via INTRAVENOUS

## 2019-08-23 MED ORDER — SODIUM CHLORIDE 0.9 % IV BOLUS
1000.0000 mL | Freq: Once | INTRAVENOUS | Status: AC
  Administered 2019-08-23: 1000 mL via INTRAVENOUS

## 2019-08-23 MED ORDER — IOHEXOL 300 MG/ML  SOLN
100.0000 mL | Freq: Once | INTRAMUSCULAR | Status: AC | PRN
  Administered 2019-08-23: 100 mL via INTRAVENOUS

## 2019-08-23 MED ORDER — CALCIUM POLYCARBOPHIL 625 MG PO TABS
625.0000 mg | ORAL_TABLET | Freq: Every day | ORAL | Status: DC
  Administered 2019-08-24 – 2019-08-29 (×6): 625 mg via ORAL
  Filled 2019-08-23 (×9): qty 1

## 2019-08-23 MED ORDER — ENOXAPARIN SODIUM 40 MG/0.4ML ~~LOC~~ SOLN
40.0000 mg | SUBCUTANEOUS | Status: DC
  Administered 2019-08-24 – 2019-08-30 (×6): 40 mg via SUBCUTANEOUS
  Filled 2019-08-23 (×6): qty 0.4

## 2019-08-23 MED ORDER — PRIMIDONE 250 MG PO TABS
250.0000 mg | ORAL_TABLET | Freq: Two times a day (BID) | ORAL | Status: DC
  Administered 2019-08-24 – 2019-08-30 (×14): 250 mg via ORAL
  Filled 2019-08-23 (×16): qty 1

## 2019-08-23 MED ORDER — ONDANSETRON HCL 4 MG PO TABS: 4.0000 mg | ORAL_TABLET | Freq: Four times a day (QID) | ORAL | Status: DC | PRN

## 2019-08-23 MED ORDER — SODIUM CHLORIDE 0.9% FLUSH
3.0000 mL | Freq: Two times a day (BID) | INTRAVENOUS | Status: DC
  Administered 2019-08-26 – 2019-08-30 (×10): 3 mL via INTRAVENOUS

## 2019-08-23 MED ORDER — ASPIRIN 81 MG PO CHEW
81.0000 mg | CHEWABLE_TABLET | Freq: Every day | ORAL | Status: DC
  Administered 2019-08-24 – 2019-08-30 (×7): 81 mg via ORAL
  Filled 2019-08-23 (×8): qty 1

## 2019-08-23 MED ORDER — ONDANSETRON HCL 4 MG/2ML IJ SOLN: 4.0000 mg | Freq: Four times a day (QID) | INTRAMUSCULAR | Status: DC | PRN

## 2019-08-23 MED ORDER — SODIUM CHLORIDE 0.9 % IV SOLN
200.0000 mg | Freq: Once | INTRAVENOUS | Status: AC
  Administered 2019-08-24: 200 mg via INTRAVENOUS
  Filled 2019-08-23: qty 40

## 2019-08-23 MED ORDER — DEXAMETHASONE SODIUM PHOSPHATE 10 MG/ML IJ SOLN
6.0000 mg | INTRAMUSCULAR | Status: DC
  Administered 2019-08-24 – 2019-08-26 (×3): 6 mg via INTRAVENOUS
  Filled 2019-08-23 (×3): qty 1

## 2019-08-23 MED ORDER — MEMANTINE HCL 10 MG PO TABS
10.0000 mg | ORAL_TABLET | Freq: Two times a day (BID) | ORAL | Status: DC
  Administered 2019-08-24 – 2019-08-30 (×14): 10 mg via ORAL
  Filled 2019-08-23 (×17): qty 1

## 2019-08-23 MED ORDER — SODIUM CHLORIDE 0.9 % IV SOLN
1.0000 g | Freq: Once | INTRAVENOUS | Status: AC
  Administered 2019-08-23: 21:00:00 1 g via INTRAVENOUS
  Filled 2019-08-23: qty 10

## 2019-08-23 MED ORDER — PRAVASTATIN SODIUM 40 MG PO TABS
60.0000 mg | ORAL_TABLET | Freq: Every day | ORAL | Status: DC
  Administered 2019-08-24 – 2019-08-29 (×7): 60 mg via ORAL
  Filled 2019-08-23: qty 1
  Filled 2019-08-23 (×2): qty 2
  Filled 2019-08-23: qty 1
  Filled 2019-08-23 (×3): qty 2

## 2019-08-23 NOTE — ED Notes (Signed)
Stuck pt twice I was unable to get pt blood

## 2019-08-23 NOTE — ED Notes (Signed)
Labs attempted x2, phlebotomy notified.

## 2019-08-23 NOTE — ED Notes (Signed)
Phlebotomist unable to obtain labs, MD notified, ultrasound at bedside.

## 2019-08-23 NOTE — ED Triage Notes (Signed)
Pt to triage via [facility]> staff reported pt "breathing funny" since this morning.  Denies SOB.  C/o generalized abd pain, nausea, and dizziness.  Denies vomiting, diarrhea.

## 2019-08-23 NOTE — H&P (Signed)
Nicole Hess BJS:[id number] DOB: September 16, 1945 DOA: 08/23/2019     PCP: Reymundo Poll, MD   Outpatient Specialists:   CARDS:   Dr. Martinique    Oncology Dr. Lamonte Sakai    Patient arrived to ER on 08/23/19 at 1011  Patient coming from:   From facility  [facility]  Chief Complaint:   Chief Complaint  Patient presents with   Abdominal Pain   Dizziness    HPI: Nicole Hess is a 73 y.o. female with medical history significant of   COVID infection  aortic stenosis sp AVR, CAD, Depression, gout, Leukopenia, Dementia    Presented with   shortness of breath lightheadedness and nausea Someone has noticed that one of her toes has been discolored and that concerned him so they wanted the patient to be evaluated today in emergency department  she was seen in emergency department on 7 December at that time she was reporting fever and nasal congestion.  Was tested positive for Covid at her facility.  In emergency department repeat testing was also positive for Covid She showed no evidence of hypoxia or chest x-ray abnormality and was able to be discharged to her facility. MPOA reports she has rapidly deteriorated over the past few months She has not family but her Friend has MPOA  Today staff noted that patient was "breathing funny" patient has self unable to provide much history but has been reporting abdominal pain nausea and some lightheadedness she denied any diarrhea or vomiting  Infectious risk factors:  Reports shortness of breath, dry cough Nausea  KNOWN COVID POSITIVE     Lab Results  Component Value Date   [place] (A) 08/21/2019     Regarding pertinent Chronic problems:   Dementia - on Nemenda   Hyperlipidemia -  on statins pravachol     CAD  - On Aspirin, statin,                  -  followed by cardiology       Hx of Seizures as a child - on Primidone  While in ER: No evidence of hypoxia potassium noted to be slightly down to 3.1 Chest  x-ray without evidence of infiltrate Plain imaging of right foot showed soft tissue swelling  CT abdomen showed no intra-abdominal abnormalities but patchy groundglass opacity of the lung bases bilaterally which would be consistent with Covid   The following Work up has been ordered so far:  Orders Placed This Encounter  Procedures   DG Chest Port 1 View   CT ABDOMEN PELVIS W CONTRAST   DG Foot Complete Right   Lipase, blood   Comprehensive metabolic panel   CBC   Urinalysis, Routine w reflex microscopic   Diet NPO time specified   Saline Lock IV, Maintain IV access   Consult to hospitalist  ALL PATIENTS BEING ADMITTED/HAVING PROCEDURES NEED COVID-19 SCREENING   Airborne Isolation   I-Stat Creatinine, ED (not at [facility])   EKG 12-Lead   ED EKG     Following Medications were ordered in ER: Medications  sodium chloride flush (NS) 0.9 % injection 3 mL (3 mLs Intravenous Given 08/23/19 1444)  sodium chloride 0.9 % bolus 1,000 mL (1,000 mLs Intravenous New Bag/Given 08/23/19 1443)  iohexol (OMNIPAQUE) 300 MG/ML solution 100 mL (100 mLs Intravenous Contrast Given 08/23/19 1724)        Consult Orders  (From admission, onward)         Start  Ordered   08/23/19 1841  Consult to hospitalist  ALL PATIENTS BEING ADMITTED/HAVING PROCEDURES NEED COVID-19 SCREENING  Once    Comments: ALL PATIENTS BEING ADMITTED/HAVING PROCEDURES NEED COVID-19 SCREENING  Provider:  (Not yet assigned)  Question Answer Comment  Place call to: [facility]   Reason for Consult Admit   Diagnosis/Clinical Info for Consult: Covid +, Hypocalcemia      08/23/19 1840          Significant initial  Findings: Abnormal Labs Reviewed  COMPREHENSIVE METABOLIC PANEL - Abnormal; Notable for the following components:      Result Value   Potassium 3.1 (*)    Chloride 118 (*)    CO2 17 (*)    Glucose, Bld 68 (*)    Creatinine, Ser 0.40 (*)    Calcium 6.1 (*)    Total Protein 4.9 (*)     Albumin 2.4 (*)    All other components within normal limits  CBC - Abnormal; Notable for the following components:   WBC 2.7 (*)    RBC 3.34 (*)    Hemoglobin 10.5 (*)    HCT 33.1 (*)    Platelets 123 (*)    All other components within normal limits  I-STAT CREATININE, ED - Abnormal; Notable for the following components:   Creatinine, Ser 0.30 (*)    All other components within normal limits  TROPONIN I (HIGH SENSITIVITY) - Abnormal; Notable for the following components:   Troponin I (High Sensitivity) 25 (*)    All other components within normal limits    Otherwise labs showing:    Recent Labs  Lab 08/23/19 1552 08/23/19 1600  NA 144  --   K 3.1*  --   CO2 17*  --   GLUCOSE 68*  --   BUN 17  --   CREATININE 0.40* 0.30*  CALCIUM 6.1*  --     Cr     stable,   Lab Results  Component Value Date   CREATININE 0.30 (L) 08/23/2019   CREATININE 0.40 (L) 08/23/2019   CREATININE 0.83 05/03/2018    Recent Labs  Lab 08/23/19 1552  AST 31  ALT 13  ALKPHOS 84  BILITOT 0.8  PROT 4.9*  ALBUMIN 2.4*   Lab Results  Component Value Date   CALCIUM 6.1 (LL) 08/23/2019      WBC      Component Value Date/Time   WBC 2.7 (L) 08/23/2019 1552   ANC    Component Value Date/Time   NEUTROABS 1.7 11/02/2017 1058   NEUTROABS 1.7 03/30/2012 0817   ALC No components found for: LYMPHAB    Plt: Lab Results  Component Value Date   PLT 123 (L) 08/23/2019   Lactic Acid, Venous    Component Value Date/Time   LATICACIDVEN 1.37 07/06/2017 1407    Procalcitonin   Ordered   COVID-19 Labs  No results for input(s): DDIMER, FERRITIN, LDH, CRP in the last 72 hours.  Lab Results  Component Value Date   SARSCOV2NAA POSITIVE (A) 08/21/2019     HG/HCT  Stable,     Component Value Date/Time   HGB 10.5 (L) 08/23/2019 1552   HGB 12.8 11/02/2017 1058   HGB 12.7 03/30/2012 0817   HCT 33.1 (L) 08/23/2019 1552   HCT 38.8 11/02/2017 1058   HCT 38.0 03/30/2012 0817    Recent  Labs  Lab 08/23/19 1552  LIPASE 20   No results for input(s): AMMONIA in the last 168 hours.  No components found  for: LABALBU   Troponin 25      ECG: Ordered Personally reviewed by me showing: HR : 73 Rhythm:  NSR    no evidence of ischemic changes QTC 447     UA not ordered     CXR -  NON acute  CTabd/pelvis - non acute, ground glass opacities bilateraly     ED Triage Vitals  Enc Vitals Group     BP 08/23/19 1021 (!) 113/101     Pulse Rate 08/23/19 1021 72     Resp 08/23/19 1021 20     Temp 08/23/19 1021 98.8 F (37.1 C)     Temp Source 08/23/19 1021 Oral     SpO2 08/23/19 1021 100 %     Weight --      Height 08/23/19 1021 5' 8" (1.727 m)     Head Circumference --      Peak Flow --      Pain Score 08/23/19 1030 5     Pain Loc --      Pain Edu? --      Excl. in [place]? --   TMAX(24)@       Latest  Blood pressure (!) 157/73, pulse 65, temperature 98.8 F (37.1 C), temperature source Oral, resp. rate 12, height 5' 8" (1.727 m), last menstrual period 09/15/1987, SpO2 100 %.     Hospitalist was called for admission for hypocalcemia, COVID infection   Review of Systems:    Pertinent positives include: fatigue, shortness of breath at rest. nausea Constitutional:  No weight loss, night sweats, Fevers, chills,  weight loss  HEENT:  No headaches, Difficulty swallowing,Tooth/dental problems,Sore throat,  No sneezing, itching, ear ache, nasal congestion, post nasal drip,  Cardio-vascular:  No chest pain, Orthopnea, PND, anasarca, dizziness, palpitations.no Bilateral lower extremity swelling  GI:  No heartburn, indigestion, abdominal pain, , vomiting, diarrhea, change in bowel habits, loss of appetite, melena, blood in stool, hematemesis Resp:  no No dyspnea on exertion, No excess mucus, no productive cough, No non-productive cough, No coughing up of blood.No change in color of mucus.No wheezing. Skin:  no rash or lesions. No jaundice GU:  no dysuria, change in  color of urine, no urgency or frequency. No straining to urinate.  No flank pain.  Musculoskeletal:  No joint pain or no joint swelling. No decreased range of motion. No back pain.  Psych:  No change in mood or affect. No depression or anxiety. No memory loss.  Neuro: no localizing neurological complaints, no tingling, no weakness, no double vision, no gait abnormality, no slurred speech, no confusion  All systems reviewed and apart from [place] all are negative  Past Medical History:   Past Medical History:  Diagnosis Date   Abnormal Pap smear    Anemia    Anxiety    Aortic stenosis    Bicuspid aortic valve    Coronary artery disease    Depression    Gout    Heart murmur    Heart valve problem    will have follow up Echo-Dr Jordan-will have replacement in a couple of years   Leukopenia    Seen by Dr. Lamonte Sakai, present since 2008, watchful waiting   Memory difficulty 04/29/2017   Osteopenia    Seizures ([place])    as a child   Shortness of breath dyspnea    Vertigo 04/29/2017   Vertigo       Past Surgical History:  Procedure Laterality Date   AORTIC VALVE REPLACEMENT N/A  02/26/2015   Procedure: AORTIC VALVE REPLACEMENT (AVR);  Surgeon: Gaye Pollack, MD;  Location: [facility];  Service: Open Heart Surgery;  Laterality: N/A;   CARDIAC CATHETERIZATION N/A 01/29/2015   Procedure: Right/Left Heart Cath and Coronary Angiography;  Surgeon: Peter M Martinique, MD;  Location: [facility];  Service: Cardiovascular;  Laterality: N/A;   CERVICAL CONE BIOPSY     COLONOSCOPY  2010   DOPPLER ECHOCARDIOGRAPHY  3/14   GUM SURGERY     PARTIAL HYSTERECTOMY     TEE WITHOUT CARDIOVERSION N/A 02/26/2015   Procedure: TRANSESOPHAGEAL ECHOCARDIOGRAM (TEE);  Surgeon: Gaye Pollack, MD;  Location: [facility];  Service: Open Heart Surgery;  Laterality: N/A;   TUBAL LIGATION     VAGINAL HYSTERECTOMY     for dysplasia    Social History:  Ambulatory   independently      reports that  she has never smoked. She has never used smokeless tobacco. She reports previous alcohol use. She reports that she does not use drugs.     Family History:  Family History  Problem Relation Age of Onset   Multiple myeloma Mother    Stroke Father    Hypertension Father    Heart disease Father 29   Kidney disease Father    Cancer Father    Cancer Maternal Grandmother        renal   Osteoarthritis Brother     Allergies: Allergies  Allergen Reactions   Dextromethorphan Other (See Comments)    contraindication   Pseudoephedrine Other (See Comments)    contriindication   Penicillins Rash    Has patient had a PCN reaction causing immediate rash, facial/tongue/throat swelling, SOB or lightheadedness with hypotension: No Has patient had a PCN reaction causing severe rash involving mucus membranes or skin necrosis: No Has patient had a PCN reaction that required hospitalization No Has patient had a PCN reaction occurring within the last 10 years: No If all of the above answers are "NO", then may proceed with Cephalosporin use.     Prior to Admission medications   Medication Sig Start Date End Date Taking? Authorizing Provider  aspirin 81 MG chewable tablet Chew 81 mg by mouth daily.    [provider]  fluticasone (FLONASE) 50 MCG/ACT nasal spray Place 1 spray into both nostrils daily. 08/21/19   Virgel Manifold, MD  loratadine (CLARITIN) 10 MG tablet Take 10 mg by mouth as needed for allergies.    [provider]  memantine (NAMENDA) 10 MG tablet Take 1 tablet (10 mg total) by mouth 2 (two) times daily. 06/02/18   Kathrynn Ducking, MD  pravastatin (PRAVACHOL) 40 MG tablet Take 1.5 tablets by mouth at bedtime.  09/26/18   [provider]  primidone (MYSOLINE) 250 MG tablet TAKE 1 TABLET BY MOUTH TWICE DAILY. Patient taking differently: Take 250 mg by mouth 2 (two) times daily.  05/17/18   Kathrynn Ducking, MD  psyllium (REGULOID) 0.52 g capsule Take  0.52 g by mouth at bedtime.    [provider]  sennosides-docusate sodium (SENOKOT-S) 8.6-50 MG tablet Take 1 tablet by mouth See admin instructions. Take 1 tablet by mouth daily at bedtime on Monday, Wednesday and Friday    [provider]  terconazole (TERAZOL 7) 0.4 % vaginal cream Place 1 applicator vaginally at bedtime. Apply small amount of cream to inner labia major twice daily.  Use vaginally and topically for 7 days. Patient not taking: Reported on 08/21/2019 07/04/19  Megan Salon, MD   Physical Exam: Blood pressure (!) 157/73, pulse 65, temperature 98.8 F (37.1 C), temperature source Oral, resp. rate 12, height 5' 8" (1.727 m), last menstrual period 09/15/1987, SpO2 100 %. 1. General:  in No Acute distress   Chronically ill -appearing 2. Psychological: Alert and  Oriented to self 3. Head/ENT:    Dry Mucous Membranes                          Head Non traumatic, neck supple                           Poor Dentition 4. SKIN:  decreased Skin turgor,  Skin clean Dry and intact no rash 5. Heart: Regular rate and rhythm no  Murmur, no Rub or gallop 6. Lungs:  no wheezes or crackles   7. Abdomen: Soft, non-tender, Non distended  bowel sounds present 8. Lower extremities: no clubbing, cyanosis,  trace  Edema  9. Neurologically Grossly intact, moving all 4 extremities equally  10. MSK: Normal range of motion   All other LABS:     Recent Labs  Lab 08/23/19 1552  WBC 2.7*  HGB 10.5*  HCT 33.1*  MCV 99.1  PLT 123*     Recent Labs  Lab 08/23/19 1552 08/23/19 1600  NA 144  --   K 3.1*  --   CL 118*  --   CO2 17*  --   GLUCOSE 68*  --   BUN 17  --   CREATININE 0.40* 0.30*  CALCIUM 6.1*  --      Recent Labs  Lab 08/23/19 1552  AST 31  ALT 13  ALKPHOS 84  BILITOT 0.8  PROT 4.9*  ALBUMIN 2.4*       Cultures:    Component Value Date/Time   SDES URINE, CLEAN CATCH 05/08/2018 1741   SPECREQUEST NONE 05/08/2018 1741   CULT (A) 05/08/2018  1741    <10,000 COLONIES/mL INSIGNIFICANT GROWTH Performed at [facility] [address], [place], [place] [address]    REPTSTATUS 05/10/2018 FINAL 05/08/2018 1741     Radiological Exams on Admission: Ct Abdomen Pelvis W Contrast  Result Date: 08/23/2019 CLINICAL DATA:  Generalized abdominal pain, history of COVID-19 positivity EXAM: CT ABDOMEN AND PELVIS WITH CONTRAST TECHNIQUE: Multidetector CT imaging of the abdomen and pelvis was performed using the standard protocol following bolus administration of intravenous contrast. CONTRAST:  126m OMNIPAQUE IOHEXOL 300 MG/ML  SOLN COMPARISON:  None. FINDINGS: Lower chest: Lung bases demonstrate mild patchy ground-glass opacities consistent with the given clinical history of COVID-19 positivity. Hepatobiliary: Liver is within normal limits. The gallbladder is decompressed. Pancreas: Unremarkable. No pancreatic ductal dilatation or surrounding inflammatory changes. Spleen: Normal in size without focal abnormality. Adrenals/Urinary Tract: Adrenal glands are within normal limits. Kidneys demonstrate a normal enhancement pattern bilaterally. Renal vascular calcifications are seen. No obstructive changes are noted. Bladder is significantly distended. Stomach/Bowel: Colon demonstrates some retained fecal material which may represent some mild constipation. No obstructive changes are seen. The appendix is not well visualized although no inflammatory changes to suggest appendicitis are seen. No obstructive or inflammatory changes of the small bowel are noted. Stomach is decompressed. Vascular/Lymphatic: Aortic atherosclerosis. No enlarged abdominal or pelvic lymph nodes. Reproductive: Status post hysterectomy. No adnexal masses. Other: No abdominal wall hernia or abnormality. No abdominopelvic ascites. Musculoskeletal: Degenerative changes of lumbar spine are noted. IMPRESSION: Patchy  ground-glass opacities in the lung bases bilaterally consistent with the  patient's given clinical history. Changes of mild constipation. No other focal abnormality is seen. Electronically Signed   By: Inez Catalina M.D.   On: 08/23/2019 17:53   Dg Chest Port 1 View  Result Date: 08/23/2019 CLINICAL DATA:  Fatigue, COVID positive EXAM: PORTABLE CHEST 1 VIEW COMPARISON:  08/21/2019 FINDINGS: The heart size and mediastinal contours are within normal limits. Valve replacement again noted. Both lungs are clear. No pleural effusion. The visualized skeletal structures are unremarkable. IMPRESSION: No acute process in the chest. Electronically Signed   By: Macy Mis M.D.   On: 08/23/2019 12:49   Dg Foot Complete Right  Result Date: 08/23/2019 CLINICAL DATA:  Confusion, toe pain EXAM: RIGHT FOOT COMPLETE - 3+ VIEW COMPARISON:  None FINDINGS: Osseous demineralization. Joint spaces preserved. No acute fracture, dislocation, or bone destruction. Soft tissue swelling at dorsum of foot overlying the distal metatarsals and MTP joints. IMPRESSION: Soft tissue swelling at dorsum of forefoot with diffuse bony demineralization. No acute osseous abnormalities. Electronically Signed   By: Lavonia Dana M.D.   On: 08/23/2019 14:39    Chart has been reviewed   Assessment/Plan   73 y.o. female with medical history significant of   COVID infection  aortic stenosis sp AVR, CAD, Depression, gout, Leukopenia, Dementia     Admitted for COVID infection and Hypocalcemia  Present on Admission:  COVID-19 virus infection -   [facility] WITH KNOWN HX OF COVID19      - As per hx pt with evidence of   shortness of breath Severe fatigue   -  With known exposure to sick contacts   Following concerning LAB/ imaging findings:  CBC: leukopenia, lymphopenia       CRP, LDH: increased   IL-6 and Ferritin increased       CT ab: GGO,      Following complications noted:  elevated troponin noted likely demand ischemia will continue to follow    Plan of treatment: - Transfer to [facility]  fac[facility] if O2 requirement >4L   -given   Infiltrates and SOB  initiate steroids Decadron 28m q 24 hours And pharmacy consult for remdesivir - Will follow daily d.dimer - Assess for ability to prone  - Supportive management -Fluid sparing resuscitation  -Provide oxygen as needed currently on RA  SpO2: 100 % - IF d.dimer elvated >5 will increase dose of lovenox    Poor Prognostic factors  73y.o.  Personal hx of   HTN,   Evidence of  organ damage  Present elevated trop      Will order Airborne and Contact precautions  Family/ patient prognosis discussion:  I have asked case with theMPOA who are aware of patient's prognosis At this point they would like  patient to be  DNR/DNI       Depression -chronic stable   Hyperlipidemia chronic stable continue home medications   Dementia without behavioral disturbance ([place] continue home medications   Hypocalcemia -corrects to about 7.3.  Obtain ionized calcium check other electrolytes such as magnesium.  Replace and monitor on telemetry EKG without abnormalities.  Check vitamin D level    Hypokalemia replace and check magnesium level   Other plan as per orders.  DVT prophylaxis:   Lovenox     Code Status:  DNR/DNI  as per MPOA I had personally discussed CODE STATUS with  MPOA    Family Communication:   Family not at  Bedside  plan of care was discussed on the phone with MPOA Disposition Plan:                              Back to current facility when stable                                             Consults called: none   Admission status:  ED Disposition    None      Obs   PPE: Used by the provider:   P100  eye Goggles,  Gloves  gown   Claudeen Leason 08/23/2019, 8:40 PM    [facility]     after 2 AM please page floor coverage PA If 7AM-7PM, please contact the day team taking care of the patient using [email or web address]

## 2019-08-23 NOTE — ED Provider Notes (Signed)
[facility] EMERGENCY DEPARTMENT Provider Note   CSN: [id number] Arrival date & time: 08/23/19  1011     History   Chief Complaint Chief Complaint  Patient presents with  . Abdominal Pain  . Dizziness    HPI Nicole Hess is a 73 y.o. female.     73 yo F with a chief complaint of abdominal pain.  This was reported by EMS.  The patient is demented and does not say that she has abdominal tenderness.  Later she says maybe she does and its to her epigastrium.  She was seen in the ED recently and found to have the novel coronavirus.  Patient denies shortness of breath denies cough.  Level 5 caveat dementia.  The history is provided by the patient.  Abdominal Pain Pain location:  Epigastric Associated symptoms: no chest pain, no chills, no dysuria, no fever, no nausea, no shortness of breath and no vomiting   Dizziness Associated symptoms: no chest pain, no headaches, no nausea, no palpitations, no shortness of breath and no vomiting   Illness Severity:  Moderate Onset quality:  Gradual Duration:  2 days Timing:  Constant Progression:  Unchanged Chronicity:  New Associated symptoms: abdominal pain   Associated symptoms: no chest pain, no congestion, no fever, no headaches, no myalgias, no nausea, no rhinorrhea, no shortness of breath, no vomiting and no wheezing     Past Medical History:  Diagnosis Date  . Abnormal Pap smear   . Anemia   . Anxiety   . Aortic stenosis   . Bicuspid aortic valve   . Coronary artery disease   . Depression   . Gout   . Heart murmur   . Heart valve problem    will have follow up Echo-Dr Jordan-will have replacement in a couple of years  . Leukopenia    Seen by Dr. Lamonte Sakai, present since 2008, watchful waiting  . Memory difficulty 04/29/2017  . Osteopenia   . Seizures ([place])    as a child  . Shortness of breath dyspnea   . Vertigo 04/29/2017  . Vertigo     Patient Active Problem List   Diagnosis Date Noted  .  Overactive bladder 05/20/2018  . Seasonal allergic rhinitis due to pollen 05/20/2018  . Vertigo 04/29/2017  . Memory difficulty 04/29/2017  . Dementia without behavioral disturbance ([place]) 02/06/2017  . 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 History of colonic polyps 11/09/2016  . S/P AVR (aortic valve replacement) 04/02/2015  . Aortic stenosis due to bicuspid aortic valve 02/26/2015  . Severe aortic stenosis 05/02/2014  . Precordial pain 01/26/2013  . Depression 03/30/2012  . Hyperlipidemia 03/30/2012    Past Surgical History:  Procedure Laterality Date  . AORTIC VALVE REPLACEMENT N/A 02/26/2015   Procedure: AORTIC VALVE REPLACEMENT (AVR);  Surgeon: Gaye Pollack, MD;  Location: [facility];  Service: Open Heart Surgery;  Laterality: N/A;  . CARDIAC CATHETERIZATION N/A 01/29/2015   Procedure: Right/Left Heart Cath and Coronary Angiography;  Surgeon: Peter M Martinique, MD;  Location: [facility];  Service: Cardiovascular;  Laterality: N/A;  . CERVICAL CONE BIOPSY    . COLONOSCOPY  2010  . DOPPLER ECHOCARDIOGRAPHY  3/14  . GUM SURGERY    . PARTIAL HYSTERECTOMY    . TEE WITHOUT CARDIOVERSION N/A 02/26/2015   Procedure: TRANSESOPHAGEAL ECHOCARDIOGRAM (TEE);  Surgeon: Gaye Pollack, MD;  Location: [facility];  Service: Open Heart Surgery;  Laterality: N/A;  .  TUBAL LIGATION    . VAGINAL HYSTERECTOMY     for dysplasia     OB History    Gravida  0   Para  0   Term  0   Preterm  0   AB  0   Living  0     SAB  0   TAB  0   Ectopic  0   Multiple  0   Live Births               Home Medications    Prior to Admission medications   Medication Sig Start Date End Date Taking? Authorizing Provider  aspirin 81 MG chewable tablet Chew 81 mg by mouth daily.    [provider]  fluticasone (FLONASE) 50 MCG/ACT nasal spray Place 1 spray into both nostrils daily. 08/21/19   Virgel Manifold, MD  loratadine  (CLARITIN) 10 MG tablet Take 10 mg by mouth as needed for allergies.    [provider]  memantine (NAMENDA) 10 MG tablet Take 1 tablet (10 mg total) by mouth 2 (two) times daily. 06/02/18   Kathrynn Ducking, MD  pravastatin (PRAVACHOL) 40 MG tablet Take 1.5 tablets by mouth at bedtime.  09/26/18   [provider]  primidone (MYSOLINE) 250 MG tablet TAKE 1 TABLET BY MOUTH TWICE DAILY. Patient taking differently: Take 250 mg by mouth 2 (two) times daily.  05/17/18   Kathrynn Ducking, MD  psyllium (REGULOID) 0.52 g capsule Take 0.52 g by mouth at bedtime.    [provider]  sennosides-docusate sodium (SENOKOT-S) 8.6-50 MG tablet Take 1 tablet by mouth See admin instructions. Take 1 tablet by mouth daily at bedtime on Monday, Wednesday and Friday    [provider]  terconazole (TERAZOL 7) 0.4 % vaginal cream Place 1 applicator vaginally at bedtime. Apply small amount of cream to inner labia major twice daily.  Use vaginally and topically for 7 days. Patient not taking: Reported on 08/21/2019 07/04/19   Megan Salon, MD    Family History Family History  Problem Relation Age of Onset  . Multiple myeloma Mother   . Stroke Father   . Hypertension Father   . Heart disease Father 70  . Kidney disease Father   . Cancer Father   . Cancer Maternal Grandmother        renal  . Osteoarthritis Brother     Social History Social History   Tobacco Use  . Smoking status: Never Smoker  . Smokeless tobacco: Never Used  Substance Use Topics  . Alcohol use: Not Currently  . Drug use: No     Allergies   Dextromethorphan, Pseudoephedrine, and Penicillins   Review of Systems Review of Systems  Constitutional: Negative for chills and fever.  HENT: Negative for congestion and rhinorrhea.   Eyes: Negative for redness and visual disturbance.  Respiratory: Negative for shortness of breath and wheezing.   Cardiovascular: Negative for chest pain and palpitations.   Gastrointestinal: Positive for abdominal pain. Negative for nausea and vomiting.  Genitourinary: Negative for dysuria and urgency.  Musculoskeletal: Negative for arthralgias and myalgias.  Skin: Negative for pallor and wound.  Neurological: Positive for dizziness. Negative for headaches.     Physical Exam Updated Vital Signs BP (!) 158/67 (BP Location: Right Arm)   Pulse 64   Temp 98.8 F (37.1 C) (Oral)   Resp 10   Ht 5' 8"  (1.727 m)   LMP 09/15/1987   SpO2 97%  BMI 20.01 kg/m   Physical Exam Vitals signs and nursing note reviewed.  Constitutional:      General: She is not in acute distress.    Appearance: She is well-developed. She is not diaphoretic.  HENT:     Head: Normocephalic and atraumatic.  Eyes:     Pupils: Pupils are equal, round, and reactive to light.  Neck:     Musculoskeletal: Normal range of motion and neck supple.  Cardiovascular:     Rate and Rhythm: Normal rate and regular rhythm.     Heart sounds: No murmur. No friction rub. No gallop.   Pulmonary:     Effort: Pulmonary effort is normal.     Breath sounds: No wheezing or rales.  Abdominal:     General: There is no distension.     Palpations: Abdomen is soft.     Tenderness: There is abdominal tenderness (mild diffuse).  Musculoskeletal:        General: No tenderness.  Skin:    General: Skin is warm and dry.  Neurological:     Mental Status: She is alert and oriented to person, place, and time.  Psychiatric:        Behavior: Behavior normal.      ED Treatments / Results  Labs (all labs ordered are listed, but only abnormal results are displayed) Labs Reviewed  LIPASE, BLOOD  COMPREHENSIVE METABOLIC PANEL  CBC  URINALYSIS, ROUTINE W REFLEX MICROSCOPIC  I-STAT CREATININE, ED  TROPONIN I (HIGH SENSITIVITY)    EKG EKG Interpretation  Date/Time:  Wednesday August 23 2019 10:28:18 EST Ventricular Rate:  73 PR Interval:  126 QRS Duration: 76 QT Interval:  406 QTC Calculation:  447 R Axis:   35 Text Interpretation: Normal sinus rhythm Nonspecific ST and T wave abnormality Abnormal ECG No significant change since last tracing Confirmed by Deno Etienne [id number]) on 08/23/2019 12:13:43 PM   Radiology Dg Chest Port 1 View  Result Date: 08/23/2019 CLINICAL DATA:  Fatigue, COVID positive EXAM: PORTABLE CHEST 1 VIEW COMPARISON:  08/21/2019 FINDINGS: The heart size and mediastinal contours are within normal limits. Valve replacement again noted. Both lungs are clear. No pleural effusion. The visualized skeletal structures are unremarkable. IMPRESSION: No acute process in the chest. Electronically Signed   By: Macy Mis M.D.   On: 08/23/2019 12:49   Dg Foot Complete Right  Result Date: 08/23/2019 CLINICAL DATA:  Confusion, toe pain EXAM: RIGHT FOOT COMPLETE - 3+ VIEW COMPARISON:  None FINDINGS: Osseous demineralization. Joint spaces preserved. No acute fracture, dislocation, or bone destruction. Soft tissue swelling at dorsum of foot overlying the distal metatarsals and MTP joints. IMPRESSION: Soft tissue swelling at dorsum of forefoot with diffuse bony demineralization. No acute osseous abnormalities. Electronically Signed   By: Lavonia Dana M.D.   On: 08/23/2019 14:39    Procedures Procedures (including critical care time) Procedure note: Ultrasound Guided Peripheral IV Ultrasound guided peripheral 1.88 inch angiocath IV placement performed by me. Indications: Nursing unable to place IV. Details: The antecubital fossa and upper arm were evaluated with a multifrequency linear probe. Patent brachial veins were noted. 1 attempt was made to cannulate a vein under realtime US guidance with successful cannulation of the vein and catheter placement. There is return of non-pulsatile dark red blood. The patient tolerated the procedure well without complications. Images archived electronically.  CPT codes: [phone] and [phone]  Medications Ordered in ED Medications  sodium chloride flush  (NS) 0.9 % injection 3 mL (3 mLs Intravenous Given  08/23/19 1444)  sodium chloride 0.9 % bolus 1,000 mL (1,000 mLs Intravenous New Bag/Given 08/23/19 1443)     Initial Impression / Assessment and Plan / ED Course  I have reviewed the triage vital signs and the nursing notes.  Pertinent labs & imaging results that were available during my care of the patient were reviewed by me and considered in my medical decision making (see chart for details).  Clinical Course as of Aug 22 1558  Wed Aug 23, 2019  1528 Pt signed out to me by Dr. Tyrone Nine.  Briefly 73 yo female w/ recent diagnosis of COVID-19 presenting by EMS for abdominal pain, although her husband at bedside reports his concern was discoloration of her toe.  Xray of foot unremarkable.  Pending abdominal evaluation and CT imaging, difficult vascular access, if workup unremarkable anticipate discharge home.   [MT]    Clinical Course User Index [MT] Wyvonnia Dusky, MD       73 yo F with a chief complaints of abdominal pain.  This was reported from the nursing home.  Patient with mild epigastric tenderness.  Was diagnosed with the novel coronavirus couple days ago.  Not hypoxic.  Will obtain plain film the chest lab work CT scan of the abdomen pelvis reassess.  Family has arrived and told me that he had sent the patient here because of discoloration of one of her toes.  I reexamined the patient and she has some slight purplish discoloration about the proximal and distal interphalangeal joints of the right second toe.  Noncircumferential.  No obvious tenderness.  Plain film without fracture.  Awaiting lab work and CT scan.  Signed out to Dr. Langston Masker, Please see his note for further details of care in the ED.  DESIRAY ORCHARD was evaluated in Emergency Department on 08/23/2019 for the symptoms described in the history of present illness. He/she was evaluated in the context of the global COVID-19 pandemic, which necessitated consideration that  the patient might be at risk for infection with the SARS-CoV-2 virus that causes COVID-19. Institutional protocols and algorithms that pertain to the evaluation of patients at risk for COVID-19 are in a state of rapid change based on information released by regulatory bodies including the CDC and federal and state organizations. These policies and algorithms were followed during the patient's care in the ED.   The patients results and plan were reviewed and discussed.   Any x-rays performed were independently reviewed by myself.   Differential diagnosis were considered with the presenting HPI.  Medications  sodium chloride flush (NS) 0.9 % injection 3 mL (3 mLs Intravenous Given 08/23/19 1444)  sodium chloride 0.9 % bolus 1,000 mL (1,000 mLs Intravenous New Bag/Given 08/23/19 1443)    Vitals:   08/23/19 1021 08/23/19 1430  BP: (!) 113/101 (!) 158/67  Pulse: 72 64  Resp: 20 10  Temp: 98.8 F (37.1 C)   TempSrc: Oral   SpO2: 100% 97%  Height: 5' 8"  (1.727 m)     Final diagnoses:  Epigastric pain  COVID toes      Final Clinical Impressions(s) / ED Diagnoses   Final diagnoses:  Epigastric pain  COVID toes    ED Discharge Orders    None       Deno Etienne, DO 08/23/19 1600

## 2019-08-24 DIAGNOSIS — E8809 Other disorders of plasma-protein metabolism, not elsewhere classified: Secondary | ICD-10-CM | POA: Diagnosis present

## 2019-08-24 DIAGNOSIS — E785 Hyperlipidemia, unspecified: Secondary | ICD-10-CM | POA: Diagnosis present

## 2019-08-24 DIAGNOSIS — Z823 Family history of stroke: Secondary | ICD-10-CM | POA: Diagnosis not present

## 2019-08-24 DIAGNOSIS — Z7982 Long term (current) use of aspirin: Secondary | ICD-10-CM | POA: Diagnosis not present

## 2019-08-24 DIAGNOSIS — Z79899 Other long term (current) drug therapy: Secondary | ICD-10-CM | POA: Diagnosis not present

## 2019-08-24 DIAGNOSIS — Z952 Presence of prosthetic heart valve: Secondary | ICD-10-CM | POA: Diagnosis not present

## 2019-08-24 DIAGNOSIS — Z8249 Family history of ischemic heart disease and other diseases of the circulatory system: Secondary | ICD-10-CM | POA: Diagnosis not present

## 2019-08-24 DIAGNOSIS — F329 Major depressive disorder, single episode, unspecified: Secondary | ICD-10-CM | POA: Diagnosis not present

## 2019-08-24 DIAGNOSIS — I2510 Atherosclerotic heart disease of native coronary artery without angina pectoris: Secondary | ICD-10-CM | POA: Diagnosis present

## 2019-08-24 DIAGNOSIS — J1289 Other viral pneumonia: Secondary | ICD-10-CM | POA: Diagnosis present

## 2019-08-24 DIAGNOSIS — I248 Other forms of acute ischemic heart disease: Secondary | ICD-10-CM | POA: Diagnosis present

## 2019-08-24 DIAGNOSIS — E876 Hypokalemia: Secondary | ICD-10-CM | POA: Diagnosis present

## 2019-08-24 DIAGNOSIS — Z841 Family history of disorders of kidney and ureter: Secondary | ICD-10-CM | POA: Diagnosis not present

## 2019-08-24 DIAGNOSIS — Z9181 History of falling: Secondary | ICD-10-CM | POA: Diagnosis not present

## 2019-08-24 DIAGNOSIS — E872 Acidosis: Secondary | ICD-10-CM | POA: Diagnosis present

## 2019-08-24 DIAGNOSIS — R7989 Other specified abnormal findings of blood chemistry: Secondary | ICD-10-CM | POA: Diagnosis not present

## 2019-08-24 DIAGNOSIS — R209 Unspecified disturbances of skin sensation: Secondary | ICD-10-CM

## 2019-08-24 DIAGNOSIS — R600 Localized edema: Secondary | ICD-10-CM | POA: Diagnosis present

## 2019-08-24 DIAGNOSIS — F0390 Unspecified dementia without behavioral disturbance: Secondary | ICD-10-CM | POA: Diagnosis present

## 2019-08-24 DIAGNOSIS — U071 COVID-19: Secondary | ICD-10-CM | POA: Diagnosis present

## 2019-08-24 DIAGNOSIS — R8271 Bacteriuria: Secondary | ICD-10-CM | POA: Diagnosis present

## 2019-08-24 DIAGNOSIS — Z66 Do not resuscitate: Secondary | ICD-10-CM | POA: Diagnosis present

## 2019-08-24 DIAGNOSIS — R1013 Epigastric pain: Secondary | ICD-10-CM | POA: Diagnosis present

## 2019-08-24 DIAGNOSIS — G40909 Epilepsy, unspecified, not intractable, without status epilepticus: Secondary | ICD-10-CM | POA: Diagnosis present

## 2019-08-24 DIAGNOSIS — R339 Retention of urine, unspecified: Secondary | ICD-10-CM | POA: Diagnosis present

## 2019-08-24 DIAGNOSIS — D61818 Other pancytopenia: Secondary | ICD-10-CM | POA: Diagnosis present

## 2019-08-24 LAB — CBC WITH DIFFERENTIAL/PLATELET
Abs Immature Granulocytes: 0.02 10*3/uL (ref 0.00–0.07)
Basophils Absolute: 0 10*3/uL (ref 0.0–0.1)
Basophils Relative: 0 %
Eosinophils Absolute: 0 10*3/uL (ref 0.0–0.5)
Eosinophils Relative: 0 %
HCT: 38.1 % (ref 36.0–46.0)
Hemoglobin: 12.4 g/dL (ref 12.0–15.0)
Immature Granulocytes: 1 %
Lymphocytes Relative: 16 %
Lymphs Abs: 0.6 10*3/uL — ABNORMAL LOW (ref 0.7–4.0)
MCH: 31.3 pg (ref 26.0–34.0)
MCHC: 32.5 g/dL (ref 30.0–36.0)
MCV: 96.2 fL (ref 80.0–100.0)
Monocytes Absolute: 0.2 10*3/uL (ref 0.1–1.0)
Monocytes Relative: 6 %
Neutro Abs: 2.6 10*3/uL (ref 1.7–7.7)
Neutrophils Relative %: 77 %
Platelets: 154 10*3/uL (ref 150–400)
RBC: 3.96 MIL/uL (ref 3.87–5.11)
RDW: 13.4 % (ref 11.5–15.5)
WBC: 3.5 10*3/uL — ABNORMAL LOW (ref 4.0–10.5)
nRBC: 0 % (ref 0.0–0.2)

## 2019-08-24 LAB — COMPREHENSIVE METABOLIC PANEL
ALT: 19 U/L (ref 0–44)
AST: 31 U/L (ref 15–41)
Albumin: 3 g/dL — ABNORMAL LOW (ref 3.5–5.0)
Alkaline Phosphatase: 106 U/L (ref 38–126)
Anion gap: 12 (ref 5–15)
BUN: 14 mg/dL (ref 8–23)
CO2: 26 mmol/L (ref 22–32)
Calcium: 8.3 mg/dL — ABNORMAL LOW (ref 8.9–10.3)
Chloride: 103 mmol/L (ref 98–111)
Creatinine, Ser: 0.61 mg/dL (ref 0.44–1.00)
GFR calc Af Amer: >60 mL/min (ref >60)
GFR calc non Af Amer: >60 mL/min (ref >60)
Glucose, Bld: 103 mg/dL — ABNORMAL HIGH (ref 70–99)
Potassium: 3.7 mmol/L (ref 3.5–5.1)
Sodium: 141 mmol/L (ref 135–145)
Total Bilirubin: 0.3 mg/dL (ref 0.3–1.2)
Total Protein: 6.3 g/dL — ABNORMAL LOW (ref 6.5–8.1)

## 2019-08-24 LAB — D-DIMER, QUANTITATIVE: D-Dimer, Quant: 1.87 ug/mL-FEU — ABNORMAL HIGH (ref 0.00–0.50)

## 2019-08-24 LAB — FERRITIN: Ferritin: 298 ng/mL (ref 11–307)

## 2019-08-24 LAB — TROPONIN I (HIGH SENSITIVITY)
Troponin I (High Sensitivity): 20 ng/L — ABNORMAL HIGH (ref <18)
Troponin I (High Sensitivity): 34 ng/L — ABNORMAL HIGH (ref <18)

## 2019-08-24 LAB — C-REACTIVE PROTEIN: CRP: 7.5 mg/dL — ABNORMAL HIGH (ref <1.0)

## 2019-08-24 LAB — MAGNESIUM: Magnesium: 1.8 mg/dL (ref 1.7–2.4)

## 2019-08-24 LAB — PREALBUMIN: Prealbumin: 9.7 mg/dL — ABNORMAL LOW (ref 18–38)

## 2019-08-24 MED ORDER — HALOPERIDOL LACTATE 5 MG/ML IJ SOLN: 1.0000 mg | Freq: Once | INTRAMUSCULAR | Status: DC

## 2019-08-24 NOTE — ED Notes (Signed)
Attempted to take soft wrist restraints off, patient tried to take IV out and crawl out of bed. Patient re oriented to situation that she has COVID19 and cannot get up and walk around the room or outside the room. Patient states "I am leaving." Soft wrist restraints put back on patient. Will continue to monitor the patient. Sitter now at bedside to watch patient from out side the room.

## 2019-08-24 NOTE — ED Notes (Signed)
Tele

## 2019-08-24 NOTE — ED Notes (Signed)
Patient toileted, restraints adjusted, soft belt restraint applied. Patient resting comfortably in bed. Will continue to monitor.

## 2019-08-24 NOTE — ED Notes (Signed)
Pt is NSR on monitor. Pt is sitting up in bed and attempting to get out of bed even though she has restraints on.

## 2019-08-24 NOTE — ED Notes (Signed)
Breakfast Ordered 

## 2019-08-24 NOTE — Progress Notes (Signed)
PROGRESS NOTE    Nicole Hess  [id number] DOB: 11-20-1945 DOA: 08/23/2019 PCP: Reymundo Poll, MD   Brief Narrative: Nicole Hess is a 73 y.o. female with a history of COVID infection, aortic stenosis s/p AVR, CAD, depression, gout, dementia. Patient presented secondary to    Assessment & Plan:   Active Problems:   Depression   Hyperlipidemia   S/P AVR (aortic valve replacement)   Dementia without behavioral disturbance (HCC)   Hypocalcemia   COVID-19 virus infection   Hypokalemia   COVID-19 infection Chest x-ray significant for no acute process. She is on room air without hypoxia. CRP of 7.3 on admission up to 7.5 today.  -Daily CMP, CBC, Ferritin, D-dimer, CRP  Dementia Patient is from a memory care unit. She is on Namenda -Continue Namenda  Cold extremities Unknown etiology. Could not palpate DP pulses bilaterally. No evidence of acute ischemia on physical exam. -Arterial ultrasound of LE  Hyperlipidemia -Hold pravastatin  Hypocalcemia 25 hydroxy Vitamin D normal. Corrected calcium is actually 8.9 today. Resolved. Ionized calcium pending.  Hypokalemia Mild. Resolved.   DVT prophylaxis: Lovenox Code Status:   Code Status: DNR Family Communication: None Disposition Plan: Transfer back to facility pending completion of Remdesivir   Consultants:   None  Procedures:   None  Antimicrobials:  Remdesvir (12/9>>    Subjective: No dyspnea or chest pain  Objective: Vitals:   08/24/19 0700 08/24/19 0800 08/24/19 0815 08/24/19 0900  BP: 115/69 116/69 124/66 135/73  Pulse: (!) 58 (!) 58 61 69  Resp: (!) 5 (!) 8 (!) 8 12  Temp:      TempSrc:      SpO2: 100% 100% 100% 100%  Height:        Intake/Output Summary (Last 24 hours) at 08/24/2019 1008 Last data filed at 08/24/2019 0516 Gross per 24 hour  Intake 1800 ml  Output -  Net 1800 ml   There were no vitals filed for this visit.  Examination:  General exam: Appears calm and  comfortable. Respiratory system: Respiratory effort normal. Cardiovascular: bilateral DP pulses 0+.  Gastrointestinal system: Abdomen is nondistended, soft and nontender. No organomegaly or masses felt. Normal bowel sounds heard. Central nervous system: Alert and oriented to city. No focal neurological deficits. Extremities: Edema of bilateral feet. No calf tenderness. Skin: No cyanosis. No rashes. Bilateral cool feet and ankles Psychiatry: Judgement and insight appear impaired. Anxious mood.    Data Reviewed: I have personally reviewed following labs and imaging studies  CBC: Recent Labs  Lab 08/23/19 1552 08/24/19 0500  WBC 2.7* 3.5*  NEUTROABS  --  2.6  HGB 10.5* 12.4  HCT 33.1* 38.1  MCV 99.1 96.2  PLT 123* [id number]   Basic Metabolic Panel: Recent Labs  Lab 08/23/19 1552 08/23/19 1600 08/23/19 2105 08/24/19 0500  NA 144  --   --  141  K 3.1*  --   --  3.7  CL 118*  --   --  103  CO2 17*  --   --  26  GLUCOSE 68*  --   --  103*  BUN 17  --   --  14  CREATININE 0.40* 0.30*  --  0.61  CALCIUM 6.1*  --   --  8.3*  MG  --   --  1.9 1.8  PHOS  --   --  2.9  --    GFR: CrCl cannot be calculated (Unknown ideal weight.). Liver Function Tests: Recent Labs  Lab 08/23/19 1552  08/24/19 0500  AST 31 31  ALT 13 19  ALKPHOS 84 106  BILITOT 0.8 0.3  PROT 4.9* 6.3*  ALBUMIN 2.4* 3.0*   Recent Labs  Lab 08/23/19 1552  LIPASE 20   No results for input(s): AMMONIA in the last 168 hours. Coagulation Profile: No results for input(s): INR, PROTIME in the last 168 hours. Cardiac Enzymes: No results for input(s): CKTOTAL, CKMB, CKMBINDEX, TROPONINI in the last 168 hours. BNP (last 3 results) No results for input(s): PROBNP in the last 8760 hours. HbA1C: No results for input(s): HGBA1C in the last 72 hours. CBG: No results for input(s): GLUCAP in the last 168 hours. Lipid Profile: No results for input(s): CHOL, HDL, LDLCALC, TRIG, CHOLHDL, LDLDIRECT in the last 72 hours.  Thyroid Function Tests: No results for input(s): TSH, T4TOTAL, FREET4, T3FREE, THYROIDAB in the last 72 hours. Anemia Panel: Recent Labs    08/23/19 2105 08/24/19 0500  FERRITIN 320* 298   Sepsis Labs: Recent Labs  Lab 08/23/19 2105  PROCALCITON <0.10    Recent Results (from the past 240 hour(s))  SARS CORONAVIRUS 2 (TAT 6-24 HRS) Nasopharyngeal Nasopharyngeal Swab     Status: Abnormal   Collection Time: 08/21/19  9:10 AM   Specimen: Nasopharyngeal Swab  Result Value Ref Range Status   SARS Coronavirus 2 POSITIVE (A) NEGATIVE Final    Comment: EMAILED LORI BERDIK 1602 08/21/2019 MCCORMICK K (NOTE) SARS-CoV-2 target nucleic acids are DETECTED. The SARS-CoV-2 RNA is generally detectable in upper and lower respiratory specimens during the acute phase of infection. Positive results are indicative of the presence of SARS-CoV-2 RNA. Clinical correlation with patient history and other diagnostic information is  necessary to determine patient infection status. Positive results do not rule out bacterial infection or co-infection with other viruses.  The expected result is Negative. Fact Sheet for Patients: SugarRoll.be Fact Sheet for Healthcare Providers: https://www.woods-mathews.com/ This test is not yet approved or cleared by the [place] FDA and  has been authorized for detection and/or diagnosis of SARS-CoV-2 by FDA under an Emergency Use Authorization (EUA). This EUA will remain  in effect (meaning this test can be used) for the duration of the COVID-19 declaration  under Section 564(b)(1) of the Act, 21 U.S.C. section 360bbb-3(b)(1), unless the authorization is terminated or revoked sooner. Performed at [facility], [place] [address], [place], [place] [address]   Culture, blood (Routine X 2) w Reflex to ID Panel     Status: None (Preliminary result)   Collection Time: 08/24/19  1:00 AM   Specimen: BLOOD  Result Value Ref  Range Status   Specimen Description BLOOD RIGHT ANTECUBITAL  Final   Special Requests   Final    BOTTLES DRAWN AEROBIC AND ANAEROBIC Blood Culture adequate volume   Culture   Final    NO GROWTH < 12 HOURS Performed at [facility], [place] [address] [place], [place] [address]    Report Status PENDING  Incomplete  Culture, blood (Routine X 2) w Reflex to ID Panel     Status: None (Preliminary result)   Collection Time: 08/24/19  5:05 AM   Specimen: BLOOD LEFT HAND  Result Value Ref Range Status   Specimen Description BLOOD LEFT HAND  Final   Special Requests   Final    BOTTLES DRAWN AEROBIC ONLY Blood Culture results may not be optimal due to an inadequate volume of blood received in culture bottles   Culture   Final    NO GROWTH <  12 HOURS Performed at [facility], [place] [address] [place], [place] [address]    Report Status PENDING  Incomplete         Radiology Studies: CT ABDOMEN PELVIS W CONTRAST  Result Date: 08/23/2019 CLINICAL DATA:  Generalized abdominal pain, history of COVID-19 positivity EXAM: CT ABDOMEN AND PELVIS WITH CONTRAST TECHNIQUE: Multidetector CT imaging of the abdomen and pelvis was performed using the standard protocol following bolus administration of intravenous contrast. CONTRAST:  180mL OMNIPAQUE IOHEXOL 300 MG/ML  SOLN COMPARISON:  None. FINDINGS: Lower chest: Lung bases demonstrate mild patchy ground-glass opacities consistent with the given clinical history of COVID-19 positivity. Hepatobiliary: Liver is within normal limits. The gallbladder is decompressed. Pancreas: Unremarkable. No pancreatic ductal dilatation or surrounding inflammatory changes. Spleen: Normal in size without focal abnormality. Adrenals/Urinary Tract: Adrenal glands are within normal limits. Kidneys demonstrate a normal enhancement pattern bilaterally. Renal vascular calcifications are seen. No obstructive changes are noted. Bladder is significantly distended. Stomach/Bowel:  Colon demonstrates some retained fecal material which may represent some mild constipation. No obstructive changes are seen. The appendix is not well visualized although no inflammatory changes to suggest appendicitis are seen. No obstructive or inflammatory changes of the small bowel are noted. Stomach is decompressed. Vascular/Lymphatic: Aortic atherosclerosis. No enlarged abdominal or pelvic lymph nodes. Reproductive: Status post hysterectomy. No adnexal masses. Other: No abdominal wall hernia or abnormality. No abdominopelvic ascites. Musculoskeletal: Degenerative changes of lumbar spine are noted. IMPRESSION: Patchy ground-glass opacities in the lung bases bilaterally consistent with the patient's given clinical history. Changes of mild constipation. No other focal abnormality is seen. Electronically Signed   By: Inez Catalina M.D.   On: 08/23/2019 17:53   DG Chest Port 1 View  Result Date: 08/23/2019 CLINICAL DATA:  Fatigue, COVID positive EXAM: PORTABLE CHEST 1 VIEW COMPARISON:  08/21/2019 FINDINGS: The heart size and mediastinal contours are within normal limits. Valve replacement again noted. Both lungs are clear. No pleural effusion. The visualized skeletal structures are unremarkable. IMPRESSION: No acute process in the chest. Electronically Signed   By: Macy Mis M.D.   On: 08/23/2019 12:49   DG Foot Complete Right  Result Date: 08/23/2019 CLINICAL DATA:  Confusion, toe pain EXAM: RIGHT FOOT COMPLETE - 3+ VIEW COMPARISON:  None FINDINGS: Osseous demineralization. Joint spaces preserved. No acute fracture, dislocation, or bone destruction. Soft tissue swelling at dorsum of foot overlying the distal metatarsals and MTP joints. IMPRESSION: Soft tissue swelling at dorsum of forefoot with diffuse bony demineralization. No acute osseous abnormalities. Electronically Signed   By: Lavonia Dana M.D.   On: 08/23/2019 14:39        Scheduled Meds: . aspirin  81 mg Oral Daily  . dexamethasone  (DECADRON) injection  6 mg Intravenous Q24H  . enoxaparin (LOVENOX) injection  40 mg Subcutaneous Q24H  . memantine  10 mg Oral BID  . polycarbophil  625 mg Oral QHS  . pravastatin  60 mg Oral QHS  . primidone  250 mg Oral BID  . sodium chloride flush  3 mL Intravenous Q12H   Continuous Infusions: . sodium chloride 75 mL/hr at 08/24/19 0350  . remdesivir 100 mg in NS 100 mL 100 mg (08/24/19 0954)     LOS: 0 days     Cordelia Poche, MD [facility] 08/24/2019, 10:08 AM  If 7PM-7AM, please contact night-coverage [email or web address]

## 2019-08-25 LAB — COMPREHENSIVE METABOLIC PANEL
ALT: 17 U/L (ref 0–44)
AST: 34 U/L (ref 15–41)
Albumin: 3 g/dL — ABNORMAL LOW (ref 3.5–5.0)
Alkaline Phosphatase: 104 U/L (ref 38–126)
Anion gap: 17 — ABNORMAL HIGH (ref 5–15)
BUN: 16 mg/dL (ref 8–23)
CO2: 19 mmol/L — ABNORMAL LOW (ref 22–32)
Calcium: 8.2 mg/dL — ABNORMAL LOW (ref 8.9–10.3)
Chloride: 104 mmol/L (ref 98–111)
Creatinine, Ser: 0.64 mg/dL (ref 0.44–1.00)
GFR calc Af Amer: >60 mL/min (ref >60)
GFR calc non Af Amer: >60 mL/min (ref >60)
Glucose, Bld: 89 mg/dL (ref 70–99)
Potassium: 3.4 mmol/L — ABNORMAL LOW (ref 3.5–5.1)
Sodium: 140 mmol/L (ref 135–145)
Total Bilirubin: 1.1 mg/dL (ref 0.3–1.2)
Total Protein: 5.9 g/dL — ABNORMAL LOW (ref 6.5–8.1)

## 2019-08-25 LAB — CBC WITH DIFFERENTIAL/PLATELET
Abs Immature Granulocytes: 0.02 10*3/uL (ref 0.00–0.07)
Basophils Absolute: 0 10*3/uL (ref 0.0–0.1)
Basophils Relative: 0 %
Eosinophils Absolute: 0 10*3/uL (ref 0.0–0.5)
Eosinophils Relative: 0 %
HCT: 33.8 % — ABNORMAL LOW (ref 36.0–46.0)
Hemoglobin: 11.2 g/dL — ABNORMAL LOW (ref 12.0–15.0)
Immature Granulocytes: 0 %
Lymphocytes Relative: 21 %
Lymphs Abs: 1 10*3/uL (ref 0.7–4.0)
MCH: 31.2 pg (ref 26.0–34.0)
MCHC: 33.1 g/dL (ref 30.0–36.0)
MCV: 94.2 fL (ref 80.0–100.0)
Monocytes Absolute: 0.5 10*3/uL (ref 0.1–1.0)
Monocytes Relative: 10 %
Neutro Abs: 3.2 10*3/uL (ref 1.7–7.7)
Neutrophils Relative %: 69 %
Platelets: 163 10*3/uL (ref 150–400)
RBC: 3.59 MIL/uL — ABNORMAL LOW (ref 3.87–5.11)
RDW: 13.5 % (ref 11.5–15.5)
WBC: 4.7 10*3/uL (ref 4.0–10.5)
nRBC: 0 % (ref 0.0–0.2)

## 2019-08-25 LAB — D-DIMER, QUANTITATIVE: D-Dimer, Quant: 1.19 ug/mL-FEU — ABNORMAL HIGH (ref 0.00–0.50)

## 2019-08-25 LAB — C-REACTIVE PROTEIN: CRP: 6.3 mg/dL — ABNORMAL HIGH (ref <1.0)

## 2019-08-25 LAB — CALCIUM, IONIZED: Calcium, Ionized, Serum: 4.7 mg/dL (ref 4.5–5.6)

## 2019-08-25 LAB — MAGNESIUM: Magnesium: 1.7 mg/dL (ref 1.7–2.4)

## 2019-08-25 LAB — FERRITIN: Ferritin: 225 ng/mL (ref 11–307)

## 2019-08-25 MED ORDER — PSYLLIUM 95 % PO PACK
1.0000 | PACK | Freq: Once | ORAL | Status: AC
  Administered 2019-08-26: 1 via ORAL
  Filled 2019-08-25: qty 1

## 2019-08-25 NOTE — ED Notes (Signed)
Dinner tray ordered.

## 2019-08-25 NOTE — ED Notes (Signed)
Pt sleeping. 

## 2019-08-25 NOTE — ED Notes (Signed)
Saw that pt was disconnected from tele monitor. Went into room and saw pt was sitting up in bed. Repositioned with help from another nurse. Pt seemed to be shob by increased RR, so placed on 2L [place]

## 2019-08-25 NOTE — ED Notes (Signed)
Lunch Tray Ordered @ 1107. 

## 2019-08-25 NOTE — ED Notes (Signed)
Breakfast ordered 

## 2019-08-25 NOTE — ED Notes (Signed)
Pt transferred to [facility] via [place] with RN Ryland as ride along.  Tele monitor in place, pt denies pain, NAD noted.

## 2019-08-25 NOTE — Progress Notes (Signed)
PROGRESS NOTE    Nicole Hess  [id number] DOB: [birth year] DOA: 08/23/2019 PCP: Reymundo Poll, MD   Brief Narrative: Nicole Hess is a 73 y.o. female with a history of COVID infection, aortic stenosis s/p AVR, CAD, depression, gout, dementia. Patient presented secondary to lightheadedness, nausea, abdominal pain and was found to have    Assessment & Plan:   Active Problems:   Depression   Hyperlipidemia   S/P AVR (aortic valve replacement)   Dementia without behavioral disturbance (HCC)   Hypocalcemia   COVID-19 virus infection   Hypokalemia   COVID-19 infection Chest x-ray significant for no acute process. She is on room air without hypoxia. CRP of 7.3 on admission up to 7.5 today.  -Daily CMP, CBC, Ferritin, D-dimer, CRP  Dementia Patient is from a memory care unit. She is on Namenda. Fall risk and continued attempts to climb out of bed -Continue Namenda -Posey belt for now, but seems to be improved with regard to trying to get out of bed; may trial telesitter  Cold extremities Unknown etiology. Could not palpate DP pulses bilaterally. No evidence of acute ischemia on physical exam. On chart review, initial concern of toe discoloration. In setting of COVID-19 infection. Extremities warm today -Arterial ultrasound of LE  Hyperlipidemia -Hold pravastatin  Metabolic acidosis Elevated anion gap. Unknown etiology at this time -Metabolic panel in AM  Hypocalcemia 25 hydroxy Vitamin D normal. Corrected calcium is actually 8.9 today. Resolved. Ionized calcium pending.  Hypokalemia Mild. Resolved.   DVT prophylaxis: Lovenox Code Status:   Code Status: DNR Family Communication: None Disposition Plan: Transfer back to facility pending completion of Remdesivir   Consultants:   None  Procedures:   None  Antimicrobials:  Remdesvir (12/9>>    Subjective: No shortness of breath at times. No other concerns.  Objective: Vitals:   08/25/19 0745  08/25/19 0800 08/25/19 0815 08/25/19 0830  BP: 115/67 121/71 113/83 121/67  Pulse: 82 76 73 75  Resp: 10 12 10 15   Temp:      TempSrc:      SpO2: 100% 100% 100% 100%  Height:        Intake/Output Summary (Last 24 hours) at 08/25/2019 0905 Last data filed at 08/24/2019 1046 Gross per 24 hour  Intake 100 ml  Output --  Net 100 ml   There were no vitals filed for this visit.  Examination:  General exam: Appears calm and comfortable. Pleasant Respiratory system: Clear to auscultation but diminished. Respiratory effort normal. Cardiovascular system: S1 & S2 heard, RRR. 2/6 systolic murmur with click Gastrointestinal system: Abdomen is nondistended, soft and nontender. No organomegaly or masses felt. Normal bowel sounds heard. Central nervous system: Alert and oriented to person. No focal neurological deficits. Extremities: Left foot edema on dorsum, feet warm. No calf tenderness Skin: No cyanosis. No rashes Psychiatry: Judgement and insight appear impaired. Mood & affect appropriate.      Data Reviewed: I have personally reviewed following labs and imaging studies  CBC: Recent Labs  Lab 08/23/19 1552 08/24/19 0500 08/25/19 0321  WBC 2.7* 3.5* 4.7  NEUTROABS  --  2.6 3.2  HGB 10.5* 12.4 11.2*  HCT 33.1* 38.1 33.8*  MCV 99.1 96.2 94.2  PLT 123* 154 [id number]   Basic Metabolic Panel: Recent Labs  Lab 08/23/19 1552 08/23/19 1600 08/23/19 2105 08/24/19 0500 08/25/19 0321  NA 144  --   --  141 140  K 3.1*  --   --  3.7 3.4*  CL 118*  --   --  103 104  CO2 17*  --   --  26 19*  GLUCOSE 68*  --   --  103* 89  BUN 17  --   --  14 16  CREATININE 0.40* 0.30*  --  0.61 0.64  CALCIUM 6.1*  --   --  8.3* 8.2*  MG  --   --  1.9 1.8 1.7  PHOS  --   --  2.9  --   --    GFR: CrCl cannot be calculated (Unknown ideal weight.). Liver Function Tests: Recent Labs  Lab 08/23/19 1552 08/24/19 0500 08/25/19 0321  AST 31 31 34  ALT 13 19 17   ALKPHOS 84 106 104  BILITOT 0.8 0.3  1.1  PROT 4.9* 6.3* 5.9*  ALBUMIN 2.4* 3.0* 3.0*   Recent Labs  Lab 08/23/19 1552  LIPASE 20   No results for input(s): AMMONIA in the last 168 hours. Coagulation Profile: No results for input(s): INR, PROTIME in the last 168 hours. Cardiac Enzymes: No results for input(s): CKTOTAL, CKMB, CKMBINDEX, TROPONINI in the last 168 hours. BNP (last 3 results) No results for input(s): PROBNP in the last 8760 hours. HbA1C: No results for input(s): HGBA1C in the last 72 hours. CBG: No results for input(s): GLUCAP in the last 168 hours. Lipid Profile: No results for input(s): CHOL, HDL, LDLCALC, TRIG, CHOLHDL, LDLDIRECT in the last 72 hours. Thyroid Function Tests: No results for input(s): TSH, T4TOTAL, FREET4, T3FREE, THYROIDAB in the last 72 hours. Anemia Panel: Recent Labs    08/24/19 0500 08/25/19 0321  FERRITIN 298 225   Sepsis Labs: Recent Labs  Lab 08/23/19 2105  PROCALCITON <0.10    Recent Results (from the past 240 hour(s))  SARS CORONAVIRUS 2 (TAT 6-24 HRS) Nasopharyngeal Nasopharyngeal Swab     Status: Abnormal   Collection Time: 08/21/19  9:10 AM   Specimen: Nasopharyngeal Swab  Result Value Ref Range Status   SARS Coronavirus 2 POSITIVE (A) NEGATIVE Final    Comment: EMAILED LORI BERDIK 1602 08/21/2019 MCCORMICK K (NOTE) SARS-CoV-2 target nucleic acids are DETECTED. The SARS-CoV-2 RNA is generally detectable in upper and lower respiratory specimens during the acute phase of infection. Positive results are indicative of the presence of SARS-CoV-2 RNA. Clinical correlation with patient history and other diagnostic information is  necessary to determine patient infection status. Positive results do not rule out bacterial infection or co-infection with other viruses.  The expected result is Negative. Fact Sheet for Patients: SugarRoll.be Fact Sheet for Healthcare Providers: https://www.woods-mathews.com/ This test is not  yet approved or cleared by the [place] FDA and  has been authorized for detection and/or diagnosis of SARS-CoV-2 by FDA under an Emergency Use Authorization (EUA). This EUA will remain  in effect (meaning this test can be used) for the duration of the COVID-19 declaration  under Section 564(b)(1) of the Act, 21 U.S.C. section 360bbb-3(b)(1), unless the authorization is terminated or revoked sooner. Performed at [facility], [place] [address] [place], [place] [address]   Culture, blood (Routine X 2) w Reflex to ID Panel     Status: None (Preliminary result)   Collection Time: 08/24/19  1:00 AM   Specimen: BLOOD  Result Value Ref Range Status   Specimen Description BLOOD RIGHT ANTECUBITAL  Final   Special Requests   Final    BOTTLES DRAWN AEROBIC AND ANAEROBIC Blood Culture adequate volume   Culture   Final    NO GROWTH < 12 HOURS Performed at [facility], [place] [address], [place], [place] [address]    Report Status PENDING  Incomplete  Culture, blood (Routine X 2) w Reflex to ID Panel     Status: None (Preliminary result)   Collection Time: 08/24/19  5:05 AM   Specimen: BLOOD LEFT HAND  Result Value Ref Range Status   Specimen Description BLOOD LEFT HAND  Final   Special Requests   Final    BOTTLES DRAWN AEROBIC ONLY Blood Culture results may not be optimal due to an inadequate volume of blood received in culture bottles   Culture   Final    NO GROWTH < 12 HOURS Performed at [facility], [place] [address], [place], [place] [address]    Report Status PENDING  Incomplete         Radiology Studies: CT ABDOMEN PELVIS W CONTRAST  Result Date: 08/23/2019 CLINICAL DATA:  Generalized abdominal pain, history of COVID-19 positivity EXAM: CT ABDOMEN AND PELVIS WITH CONTRAST TECHNIQUE: Multidetector CT imaging of the abdomen and pelvis was performed using the standard protocol following bolus administration of intravenous contrast. CONTRAST:  175mL OMNIPAQUE IOHEXOL  300 MG/ML  SOLN COMPARISON:  None. FINDINGS: Lower chest: Lung bases demonstrate mild patchy ground-glass opacities consistent with the given clinical history of COVID-19 positivity. Hepatobiliary: Liver is within normal limits. The gallbladder is decompressed. Pancreas: Unremarkable. No pancreatic ductal dilatation or surrounding inflammatory changes. Spleen: Normal in size without focal abnormality. Adrenals/Urinary Tract: Adrenal glands are within normal limits. Kidneys demonstrate a normal enhancement pattern bilaterally. Renal vascular calcifications are seen. No obstructive changes are noted. Bladder is significantly distended. Stomach/Bowel: Colon demonstrates some retained fecal material which may represent some mild constipation. No obstructive changes are seen. The appendix is not well visualized although no inflammatory changes to suggest appendicitis are seen. No obstructive or inflammatory changes of the small bowel are noted. Stomach is decompressed. Vascular/Lymphatic: Aortic atherosclerosis. No enlarged abdominal or pelvic lymph nodes. Reproductive: Status post hysterectomy. No adnexal masses. Other: No abdominal wall hernia or abnormality. No abdominopelvic ascites. Musculoskeletal: Degenerative changes of lumbar spine are noted. IMPRESSION: Patchy ground-glass opacities in the lung bases bilaterally consistent with the patient's given clinical history. Changes of mild constipation. No other focal abnormality is seen. Electronically Signed   By: Inez Catalina M.D.   On: 08/23/2019 17:53   DG Chest Port 1 View  Result Date: 08/23/2019 CLINICAL DATA:  Fatigue, COVID positive EXAM: PORTABLE CHEST 1 VIEW COMPARISON:  08/21/2019 FINDINGS: The heart size and mediastinal contours are within normal limits. Valve replacement again noted. Both lungs are clear. No pleural effusion. The visualized skeletal structures are unremarkable. IMPRESSION: No acute process in the chest. Electronically Signed   By:  Macy Mis M.D.   On: 08/23/2019 12:49   DG Foot Complete Right  Result Date: 08/23/2019 CLINICAL DATA:  Confusion, toe pain EXAM: RIGHT FOOT COMPLETE - 3+ VIEW COMPARISON:  None FINDINGS: Osseous demineralization. Joint spaces preserved. No acute fracture, dislocation, or bone destruction. Soft tissue swelling at dorsum of foot overlying the distal metatarsals and MTP joints. IMPRESSION: Soft tissue swelling at dorsum of forefoot with diffuse bony demineralization. No acute osseous abnormalities. Electronically Signed   By: Lavonia Dana M.D.   On: 08/23/2019 14:39        Scheduled Meds: . aspirin  81 mg Oral Daily  . dexamethasone (DECADRON) injection  6 mg Intravenous Q24H  . enoxaparin (LOVENOX) injection  40 mg Subcutaneous Q24H  . memantine  10 mg Oral  BID  . polycarbophil  625 mg Oral QHS  . pravastatin  60 mg Oral QHS  . primidone  250 mg Oral BID  . sodium chloride flush  3 mL Intravenous Q12H   Continuous Infusions: . sodium chloride 75 mL/hr at 08/24/19 0350  . remdesivir 100 mg in NS 100 mL Stopped (08/24/19 1046)     LOS: 1 day     Cordelia Poche, MD [facility] 08/25/2019, 9:05 AM  If 7PM-7AM, please contact night-coverage [email or web address]

## 2019-08-25 NOTE — ED Notes (Signed)
Upon restraint assessment it was noted that patient had some slight redness to bilateral wrist restraints. Restraints removed for a bit and then replaced and repositioned before leaving room. Will continue to monitor

## 2019-08-26 MED ORDER — POTASSIUM CHLORIDE CRYS ER 20 MEQ PO TBCR
40.0000 meq | EXTENDED_RELEASE_TABLET | Freq: Once | ORAL | Status: AC
  Administered 2019-08-26: 40 meq via ORAL
  Filled 2019-08-26: qty 2

## 2019-08-26 MED ORDER — MAGNESIUM SULFATE 2 GM/50ML IV SOLN
2.0000 g | Freq: Once | INTRAVENOUS | Status: AC
  Administered 2019-08-26: 2 g via INTRAVENOUS
  Filled 2019-08-26: qty 50

## 2019-08-26 NOTE — Progress Notes (Signed)
PROGRESS NOTE  MERCEDEE CHESTER  [id number] DOB: Jul 15, 1946 DOA: 08/23/2019 PCP: Reymundo Poll, MD   Brief Narrative: THORA ASLAM is a 73 y.o. female with a history of AS s/p AVR 2016, CAD, dementia, depression, gout, and recent covid-19 infection diagnosed 12/7 who presented from [place] at [facility] to ED 12/9 for dyspnea and toe discoloration. She was not noted to be hypoxic, and CXR showed no infiltrate. Labs demonstrated hypokalemia, hypoalbuminemia, pancytopenia, and modest elevation of troponin to 25. Due to nausea, abdominal CT was performed which demonstrated bibasilar GGOs consistent with pneumonia due to known covid-19. Right foot XR showed soft tissue swelling of the dorsum without bony abnormality. She was admitted and ultimately started on remdesivir and steroids.   Assessment & Plan: Active Problems:   Depression   Hyperlipidemia   S/P AVR (aortic valve replacement)   Dementia without behavioral disturbance (HCC)   Hypocalcemia   COVID-19 virus infection   Hypokalemia  Covid-19 pneumonia: At high risk of decompensation. Note she's entering into 2nd week of illness which is time most likely to see deterioration. Will continue remdesivir to complete course given infiltrates present on CT. - CRP stable-to-downtrending, PCT negative.  - Continue remdesivir, will DC steroids for now as she has not been hypoxic.  - Continue airborne, contact precautions. PPE including surgical gown, gloves, cap, shoe covers, and CAPR used during this encounter in a negative pressure room.  - Check daily labs: CBC w/diff, CMP, d-dimer, ferritin, CRP - Enoxaparin prophylactic dose  - Blood cultures drawn, NGTD.  - Maintain euvolemia/net negative. Stop IVF. - Avoid NSAIDs - Recommend incentive spirometry. - Repeat CXR if SOB returns.  Pancytopenia: Improving. Likely virus contributing.  Asymptomatic bacteriuria: And bladder distention on CT - R/o BOO w/bladder scan.    Dementia:  - Continue namenda - Delirium precautions  Hyperlipidemia:  - Continue statin  CAD with demand ischemia: No chest pain or ischemic ECG changes noted. - Continue ASA, statin  Seizure disorder:  - On primidone  Hypokalemia:  - Supplement and monitor, add Mg as well given low-normal value.   Hypoalbuminemia:  - Dietitian consult  Hypocalcemia: Resolved.   DVT prophylaxis: Lovenox Code Status: DNR on admission Family Communication: None at bedside Disposition Plan: Uncertain, hopeful for return to memory care unit  Consultants:   None  Procedures:   None  Antimicrobials:  Remdesivir   Subjective: Confused, denies any abd or chest pain, no shortness of breath. Eating ok.   Objective: Vitals:   08/26/19 0111 08/26/19 0431 08/26/19 0706 08/26/19 1511  BP:  128/72 126/73 105/64  Pulse:  64 74 74  Resp:  13 18 16   Temp:  (!) 97.3 F (36.3 C) 98.3 F (36.8 C) 97.8 F (36.6 C)  TempSrc:  Oral Axillary Oral  SpO2:   96% 100%  Height: 5\' 6"  (1.676 m)       Intake/Output Summary (Last 24 hours) at 08/26/2019 1735 Last data filed at 08/26/2019 1500 Gross per 24 hour  Intake 2500 ml  Output 100 ml  Net 2400 ml   There were no vitals filed for this visit.  Gen: 73 y.o. female in no distress Pulm: Non-labored breathing room air. Clear to auscultation bilaterally.  CV: Regular rate and rhythm. No murmur, rub, or gallop. No JVD, no significant pedal edema. GI: Abdomen soft, non-tender, non-distended, with normoactive bowel sounds. No organomegaly or masses felt. Ext: Warm hands and feet, no deformities Skin: No rashes, lesions or ulcers on visualized  skin Neuro: Alert and disoriented. No focal neurological deficits. Psych: Judgement and insight appear impaired. Mood & affect appropriate.   Data Reviewed: I have personally reviewed following labs and imaging studies  CBC: Recent Labs  Lab 08/23/19 1552 08/24/19 0500 08/25/19 0321  WBC 2.7*  3.5* 4.7  NEUTROABS  --  2.6 3.2  HGB 10.5* 12.4 11.2*  HCT 33.1* 38.1 33.8*  MCV 99.1 96.2 94.2  PLT 123* 154 [id number]   Basic Metabolic Panel: Recent Labs  Lab 08/23/19 1552 08/23/19 1600 08/23/19 2105 08/24/19 0500 08/25/19 0321  NA 144  --   --  141 140  K 3.1*  --   --  3.7 3.4*  CL 118*  --   --  103 104  CO2 17*  --   --  26 19*  GLUCOSE 68*  --   --  103* 89  BUN 17  --   --  14 16  CREATININE 0.40* 0.30*  --  0.61 0.64  CALCIUM 6.1*  --   --  8.3* 8.2*  MG  --   --  1.9 1.8 1.7  PHOS  --   --  2.9  --   --    GFR: CrCl cannot be calculated (Unknown ideal weight.). Liver Function Tests: Recent Labs  Lab 08/23/19 1552 08/24/19 0500 08/25/19 0321  AST 31 31 34  ALT 13 19 17   ALKPHOS 84 106 104  BILITOT 0.8 0.3 1.1  PROT 4.9* 6.3* 5.9*  ALBUMIN 2.4* 3.0* 3.0*   Recent Labs  Lab 08/23/19 1552  LIPASE 20   No results for input(s): AMMONIA in the last 168 hours. Coagulation Profile: No results for input(s): INR, PROTIME in the last 168 hours. Cardiac Enzymes: No results for input(s): CKTOTAL, CKMB, CKMBINDEX, TROPONINI in the last 168 hours. BNP (last 3 results) No results for input(s): PROBNP in the last 8760 hours. HbA1C: No results for input(s): HGBA1C in the last 72 hours. CBG: No results for input(s): GLUCAP in the last 168 hours. Lipid Profile: No results for input(s): CHOL, HDL, LDLCALC, TRIG, CHOLHDL, LDLDIRECT in the last 72 hours. Thyroid Function Tests: No results for input(s): TSH, T4TOTAL, FREET4, T3FREE, THYROIDAB in the last 72 hours. Anemia Panel: Recent Labs    08/24/19 0500 08/25/19 0321  FERRITIN 298 225   Urine analysis:    Component Value Date/Time   COLORURINE YELLOW 08/23/2019 2246   APPEARANCEUR CLEAR 08/23/2019 2246   LABSPEC 1.036 (H) 08/23/2019 2246   PHURINE 5.0 08/23/2019 2246   GLUCOSEU NEGATIVE 08/23/2019 2246   GLUCOSEU NEGATIVE 11/06/2016 1022   HGBUR NEGATIVE 08/23/2019 2246   BILIRUBINUR NEGATIVE 08/23/2019  2246   BILIRUBINUR N 07/04/2019 1119   KETONESUR 5 (A) 08/23/2019 2246   PROTEINUR NEGATIVE 08/23/2019 2246   UROBILINOGEN 0.2 07/04/2019 1119   UROBILINOGEN 0.2 05/08/2018 1732   NITRITE NEGATIVE 08/23/2019 2246   LEUKOCYTESUR TRACE (A) 08/23/2019 2246   Recent Results (from the past 240 hour(s))  SARS CORONAVIRUS 2 (TAT 6-24 HRS) Nasopharyngeal Nasopharyngeal Swab     Status: Abnormal   Collection Time: 08/21/19  9:10 AM   Specimen: Nasopharyngeal Swab  Result Value Ref Range Status   SARS Coronavirus 2 POSITIVE (A) NEGATIVE Final    Comment: EMAILED LORI BERDIK 1602 08/21/2019 MCCORMICK K (NOTE) SARS-CoV-2 target nucleic acids are DETECTED. The SARS-CoV-2 RNA is generally detectable in upper and lower respiratory specimens during the acute phase of infection. Positive results are indicative of the presence of SARS-CoV-2 RNA.  Clinical correlation with patient history and other diagnostic information is  necessary to determine patient infection status. Positive results do not rule out bacterial infection or co-infection with other viruses.  The expected result is Negative. Fact Sheet for Patients: SugarRoll.be Fact Sheet for Healthcare Providers: https://www.woods-mathews.com/ This test is not yet approved or cleared by the [place] FDA and  has been authorized for detection and/or diagnosis of SARS-CoV-2 by FDA under an Emergency Use Authorization (EUA). This EUA will remain  in effect (meaning this test can be used) for the duration of the COVID-19 declaration  under Section 564(b)(1) of the Act, 21 U.S.C. section 360bbb-3(b)(1), unless the authorization is terminated or revoked sooner. Performed at [facility], [place] [address] [place], [place] [address]   Culture, blood (Routine X 2) w Reflex to ID Panel     Status: None (Preliminary result)   Collection Time: 08/24/19  1:00 AM   Specimen: BLOOD  Result Value Ref Range  Status   Specimen Description BLOOD RIGHT ANTECUBITAL  Final   Special Requests   Final    BOTTLES DRAWN AEROBIC AND ANAEROBIC Blood Culture adequate volume   Culture   Final    NO GROWTH 2 DAYS Performed at [facility], [place] [address], [place], [place] [address]    Report Status PENDING  Incomplete  Culture, blood (Routine X 2) w Reflex to ID Panel     Status: None (Preliminary result)   Collection Time: 08/24/19  5:05 AM   Specimen: BLOOD LEFT HAND  Result Value Ref Range Status   Specimen Description BLOOD LEFT HAND  Final   Special Requests   Final    BOTTLES DRAWN AEROBIC ONLY Blood Culture results may not be optimal due to an inadequate volume of blood received in culture bottles   Culture   Final    NO GROWTH 2 DAYS Performed at [facility], [place] [address] [place], [place] [address]    Report Status PENDING  Incomplete      Radiology Studies: No results found.  Scheduled Meds: . aspirin  81 mg Oral Daily  . dexamethasone (DECADRON) injection  6 mg Intravenous Q24H  . enoxaparin (LOVENOX) injection  40 mg Subcutaneous Q24H  . memantine  10 mg Oral BID  . polycarbophil  625 mg Oral QHS  . pravastatin  60 mg Oral QHS  . primidone  250 mg Oral BID  . sodium chloride flush  3 mL Intravenous Q12H   Continuous Infusions: . sodium chloride 75 mL/hr at 08/26/19 0515  . remdesivir 100 mg in NS 100 mL 100 mg (08/26/19 1100)     LOS: 2 days   Time spent: 35 minutes.  Patrecia Pour, MD [facility] [email or web address] 08/26/2019, 5:35 PM

## 2019-08-26 NOTE — Plan of Care (Signed)
  Problem: Activity: Goal: Risk for activity intolerance will decrease Outcome: Not Progressing   Problem: Nutrition: Goal: Adequate nutrition will be maintained Outcome: Not Progressing   Problem: Elimination: Goal: Will not experience complications related to bowel motility Outcome: Not Progressing Goal: Will not experience complications related to urinary retention Outcome: Not Progressing   Problem: Safety: Goal: Ability to remain free from injury will improve Outcome: Not Progressing   Problem: Skin Integrity: Goal: Risk for impaired skin integrity will decrease Outcome: Not Progressing   Problem: Education: Goal: Knowledge of risk factors and measures for prevention of condition will improve Outcome: Not Progressing

## 2019-08-26 NOTE — Progress Notes (Signed)
Patient's primary contact, Marsena, updated on patient condition and plan of care at this time.  All questions welcomed and answered.

## 2019-08-27 LAB — CBC WITH DIFFERENTIAL/PLATELET
Abs Immature Granulocytes: 0.01 10*3/uL (ref 0.00–0.07)
Basophils Absolute: 0 10*3/uL (ref 0.0–0.1)
Basophils Relative: 0 %
Eosinophils Absolute: 0 10*3/uL (ref 0.0–0.5)
Eosinophils Relative: 1 %
HCT: 37.9 % (ref 36.0–46.0)
Hemoglobin: 12.1 g/dL (ref 12.0–15.0)
Immature Granulocytes: 0 %
Lymphocytes Relative: 28 %
Lymphs Abs: 1.2 10*3/uL (ref 0.7–4.0)
MCH: 30.6 pg (ref 26.0–34.0)
MCHC: 31.9 g/dL (ref 30.0–36.0)
MCV: 95.9 fL (ref 80.0–100.0)
Monocytes Absolute: 0.5 10*3/uL (ref 0.1–1.0)
Monocytes Relative: 11 %
Neutro Abs: 2.5 10*3/uL (ref 1.7–7.7)
Neutrophils Relative %: 60 %
Platelets: 187 10*3/uL (ref 150–400)
RBC: 3.95 MIL/uL (ref 3.87–5.11)
RDW: 13.9 % (ref 11.5–15.5)
WBC: 4.2 10*3/uL (ref 4.0–10.5)
nRBC: 0 % (ref 0.0–0.2)

## 2019-08-27 LAB — COMPREHENSIVE METABOLIC PANEL
ALT: 19 U/L (ref 0–44)
AST: 33 U/L (ref 15–41)
Albumin: 3 g/dL — ABNORMAL LOW (ref 3.5–5.0)
Alkaline Phosphatase: 99 U/L (ref 38–126)
Anion gap: 7 (ref 5–15)
BUN: 22 mg/dL (ref 8–23)
CO2: 30 mmol/L (ref 22–32)
Calcium: 8.2 mg/dL — ABNORMAL LOW (ref 8.9–10.3)
Chloride: 106 mmol/L (ref 98–111)
Creatinine, Ser: 0.57 mg/dL (ref 0.44–1.00)
GFR calc Af Amer: >60 mL/min (ref >60)
GFR calc non Af Amer: >60 mL/min (ref >60)
Glucose, Bld: 113 mg/dL — ABNORMAL HIGH (ref 70–99)
Potassium: 3.3 mmol/L — ABNORMAL LOW (ref 3.5–5.1)
Sodium: 143 mmol/L (ref 135–145)
Total Bilirubin: 0.6 mg/dL (ref 0.3–1.2)
Total Protein: 6.6 g/dL (ref 6.5–8.1)

## 2019-08-27 LAB — D-DIMER, QUANTITATIVE: D-Dimer, Quant: 1.21 ug/mL-FEU — ABNORMAL HIGH (ref 0.00–0.50)

## 2019-08-27 LAB — MAGNESIUM: Magnesium: 2.2 mg/dL (ref 1.7–2.4)

## 2019-08-27 LAB — FERRITIN: Ferritin: 227 ng/mL (ref 11–307)

## 2019-08-27 LAB — C-REACTIVE PROTEIN: CRP: 4.6 mg/dL — ABNORMAL HIGH (ref <1.0)

## 2019-08-27 MED ORDER — CHLORHEXIDINE GLUCONATE CLOTH 2 % EX PADS
6.0000 | MEDICATED_PAD | Freq: Every day | CUTANEOUS | Status: DC
  Administered 2019-08-30: 6 via TOPICAL

## 2019-08-27 MED ORDER — POTASSIUM CHLORIDE CRYS ER 20 MEQ PO TBCR
40.0000 meq | EXTENDED_RELEASE_TABLET | Freq: Once | ORAL | Status: AC
  Administered 2019-08-27: 08:00:00 40 meq via ORAL
  Filled 2019-08-27: qty 2

## 2019-08-27 NOTE — Progress Notes (Signed)
Was notified by NT that patient's bed alarm was going off and when she entered room the patient was OOB and holding her foley with the balloon still inflated.  Pt has attempted to elope numerous times today.  Physician notified and orders placed for a sitter.  Will continue to monitor closely.

## 2019-08-27 NOTE — Progress Notes (Signed)
Foley placed per physician order for retention.  Urine culture sent to lab.

## 2019-08-27 NOTE — Progress Notes (Addendum)
PROGRESS NOTE  Nicole Hess  [id number] DOB: 1945/10/17 DOA: 08/23/2019 PCP: Reymundo Poll, MD   Brief Narrative: Nicole Hess is a 73 y.o. female with a history of AS s/p AVR 2016, CAD, dementia, depression, gout, and recent covid-19 infection diagnosed 12/7 who presented from [place] at [facility] to ED 12/9 for dyspnea and toe discoloration. She was not noted to be hypoxic, and CXR showed no infiltrate. Labs demonstrated hypokalemia, hypoalbuminemia, pancytopenia, and modest elevation of troponin to 25. Due to nausea, abdominal CT was performed which demonstrated bibasilar GGOs consistent with pneumonia due to known covid-19. Right foot XR showed soft tissue swelling of the dorsum without bony abnormality. She was admitted and ultimately started on remdesivir and steroids.   Assessment & Plan: Active Problems:   Depression   Hyperlipidemia   S/P AVR (aortic valve replacement)   Dementia without behavioral disturbance (HCC)   Hypocalcemia   COVID-19 virus infection   Hypokalemia  Covid-19 pneumonia: At high risk of decompensation. Note she's entering into 2nd week of illness which is time most likely to see deterioration. Will continue remdesivir to complete course given infiltrates present on CT. - CRP downtrending, PCT negative.  - Continue remdesivir x5 days, complete course today. Steroids stopped due to no hypoxia.  - Continue airborne, contact precautions. PPE including surgical gown, gloves, cap, shoe covers, and CAPR used during this encounter in a negative pressure room.  - Check daily labs: CBC w/diff, CMP, d-dimer, ferritin, CRP - Enoxaparin prophylactic dose  - Blood cultures drawn, NGTD.  - Maintain euvolemia/net negative. Stop IVF. - Avoid NSAIDs - Recommend incentive spirometry. - Repeat CXR if SOB returns.  Pancytopenia: Improving. Likely virus contributing.  Bacteriuria, acute urinary retention: And bladder distention on CT.  - Repeat bladder  scan, I&O cath if >300cc. - Check urine culture and treat as indicated.  Dementia:  - Continue namenda - Delirium precautions  Hyperlipidemia:  - Continue statin  CAD with demand ischemia: No chest pain or ischemic ECG changes noted. - Continue ASA, statin  Seizure disorder:  - On primidone  Hypokalemia:  - Supplement again (refractory) and monitor, Mg adequate  Hypoalbuminemia:  - Dietitian consult  Hypocalcemia: Resolved.  Right foot swelling > left foot: Concern for covid toes on arrival, though extremities documented to be warm with pulses throughout hospitalization.  - Does not need arterial U/S based on improvement/stability.  - With +homan's, high risk for DVT, and reportedly acute swelling, check LE venous U/S.  - XR with soft tissue swelling as on exam, no tenderness on exam and no bony abnormalities noted.   Generalized weakness:  - PT/OT as covid may have impacted functional status to the point she needs rehabilitation.  DVT prophylaxis: Lovenox Code Status: DNR on admission Family Communication: None at bedside Disposition Plan: Uncertain, hopeful for return to memory care unit. PT/OT pending, likely to be stable for DC 12/14  Consultants:   None  Procedures:   None  Antimicrobials:  Remdesivir   Subjective: Confused, good appetite, no shortness of breath or chest pain, no complaints at all.   Objective: Vitals:   08/26/19 1910 08/27/19 0000 08/27/19 0336 08/27/19 0910  BP: (!) 116/57 110/65 104/76 117/70  Pulse:  67 71 75  Resp: 17 13 16 19   Temp: 98 F (36.7 C) 98.1 F (36.7 C) 97.8 F (36.6 C) 98.7 F (37.1 C)  TempSrc: Oral Axillary Axillary Oral  SpO2: 100% 95% 98% 100%  Height:  Intake/Output Summary (Last 24 hours) at 08/27/2019 1131 Last data filed at 08/27/2019 0600 Gross per 24 hour  Intake 2373.8 ml  Output 1450 ml  Net 923.8 ml   There were no vitals filed for this visit.  Gen: Frail female in no  distress Pulm: Nonlabored breathing room air. Clear. CV: Regular rate and rhythm. No murmur, rub, or gallop. No JVD. GI: Abdomen soft, non-tender, non-distended, with normoactive bowel sounds.  Ext: Warm, no deformities. R foot and lower calf pitting edema without tenderness to palpation on bony prominences. + homan's. Left LE less swollen, no tenderness.  Skin: Very dry, no rashes, lesions or ulcers on visualized skin. Neuro: Alert, conversant, disoriented. No focal neurological deficits. Psych: Judgement and insight appear impaired. Mood euthymic & affect congruent. Behavior is appropriate.    Data Reviewed: I have personally reviewed following labs and imaging studies  CBC: Recent Labs  Lab 08/23/19 1552 08/24/19 0500 08/25/19 0321 08/27/19 0211  WBC 2.7* 3.5* 4.7 4.2  NEUTROABS  --  2.6 3.2 2.5  HGB 10.5* 12.4 11.2* 12.1  HCT 33.1* 38.1 33.8* 37.9  MCV 99.1 96.2 94.2 95.9  PLT 123* 154 163 [id number]   Basic Metabolic Panel: Recent Labs  Lab 08/23/19 1552 08/23/19 1600 08/23/19 2105 08/24/19 0500 08/25/19 0321 08/27/19 0211  NA 144  --   --  141 140 143  K 3.1*  --   --  3.7 3.4* 3.3*  CL 118*  --   --  103 104 106  CO2 17*  --   --  26 19* 30  GLUCOSE 68*  --   --  103* 89 113*  BUN 17  --   --  14 16 22   CREATININE 0.40* 0.30*  --  0.61 0.64 0.57  CALCIUM 6.1*  --   --  8.3* 8.2* 8.2*  MG  --   --  1.9 1.8 1.7 2.2  PHOS  --   --  2.9  --   --   --    GFR: CrCl cannot be calculated (Unknown ideal weight.). Liver Function Tests: Recent Labs  Lab 08/23/19 1552 08/24/19 0500 08/25/19 0321 08/27/19 0211  AST 31 31 34 33  ALT 13 19 17 19   ALKPHOS 84 106 104 99  BILITOT 0.8 0.3 1.1 0.6  PROT 4.9* 6.3* 5.9* 6.6  ALBUMIN 2.4* 3.0* 3.0* 3.0*   Recent Labs  Lab 08/23/19 1552  LIPASE 20   No results for input(s): AMMONIA in the last 168 hours. Coagulation Profile: No results for input(s): INR, PROTIME in the last 168 hours. Cardiac Enzymes: No results for  input(s): CKTOTAL, CKMB, CKMBINDEX, TROPONINI in the last 168 hours. BNP (last 3 results) No results for input(s): PROBNP in the last 8760 hours. HbA1C: No results for input(s): HGBA1C in the last 72 hours. CBG: No results for input(s): GLUCAP in the last 168 hours. Lipid Profile: No results for input(s): CHOL, HDL, LDLCALC, TRIG, CHOLHDL, LDLDIRECT in the last 72 hours. Thyroid Function Tests: No results for input(s): TSH, T4TOTAL, FREET4, T3FREE, THYROIDAB in the last 72 hours. Anemia Panel: Recent Labs    08/25/19 0321 08/27/19 0211  FERRITIN 225 227   Urine analysis:    Component Value Date/Time   COLORURINE YELLOW 08/23/2019 2246   APPEARANCEUR CLEAR 08/23/2019 2246   LABSPEC 1.036 (H) 08/23/2019 2246   PHURINE 5.0 08/23/2019 2246   GLUCOSEU NEGATIVE 08/23/2019 2246   GLUCOSEU NEGATIVE 11/06/2016 [place] 08/23/2019 2246  BILIRUBINUR NEGATIVE 08/23/2019 2246   BILIRUBINUR N 07/04/2019 1119   KETONESUR 5 (A) 08/23/2019 2246   PROTEINUR NEGATIVE 08/23/2019 2246   UROBILINOGEN 0.2 07/04/2019 1119   UROBILINOGEN 0.2 05/08/2018 1732   NITRITE NEGATIVE 08/23/2019 2246   LEUKOCYTESUR TRACE (A) 08/23/2019 2246   Recent Results (from the past 240 hour(s))  SARS CORONAVIRUS 2 (TAT 6-24 HRS) Nasopharyngeal Nasopharyngeal Swab     Status: Abnormal   Collection Time: 08/21/19  9:10 AM   Specimen: Nasopharyngeal Swab  Result Value Ref Range Status   SARS Coronavirus 2 POSITIVE (A) NEGATIVE Final    Comment: EMAILED LORI BERDIK 1602 08/21/2019 MCCORMICK K (NOTE) SARS-CoV-2 target nucleic acids are DETECTED. The SARS-CoV-2 RNA is generally detectable in upper and lower respiratory specimens during the acute phase of infection. Positive results are indicative of the presence of SARS-CoV-2 RNA. Clinical correlation with patient history and other diagnostic information is  necessary to determine patient infection status. Positive results do not rule out bacterial  infection or co-infection with other viruses.  The expected result is Negative. Fact Sheet for Patients: SugarRoll.be Fact Sheet for Healthcare Providers: https://www.woods-mathews.com/ This test is not yet approved or cleared by the [place] FDA and  has been authorized for detection and/or diagnosis of SARS-CoV-2 by FDA under an Emergency Use Authorization (EUA). This EUA will remain  in effect (meaning this test can be used) for the duration of the COVID-19 declaration  under Section 564(b)(1) of the Act, 21 U.S.C. section 360bbb-3(b)(1), unless the authorization is terminated or revoked sooner. Performed at [facility], [place] [address], [place], [place] [address]   Culture, blood (Routine X 2) w Reflex to ID Panel     Status: None (Preliminary result)   Collection Time: 08/24/19  1:00 AM   Specimen: BLOOD  Result Value Ref Range Status   Specimen Description BLOOD RIGHT ANTECUBITAL  Final   Special Requests   Final    BOTTLES DRAWN AEROBIC AND ANAEROBIC Blood Culture adequate volume   Culture   Final    NO GROWTH 3 DAYS Performed at [facility], [place] [address], [place], [place] [address]    Report Status PENDING  Incomplete  Culture, blood (Routine X 2) w Reflex to ID Panel     Status: None (Preliminary result)   Collection Time: 08/24/19  5:05 AM   Specimen: BLOOD LEFT HAND  Result Value Ref Range Status   Specimen Description BLOOD LEFT HAND  Final   Special Requests   Final    BOTTLES DRAWN AEROBIC ONLY Blood Culture results may not be optimal due to an inadequate volume of blood received in culture bottles   Culture   Final    NO GROWTH 3 DAYS Performed at [facility], [place] [address], [place],  [address]    Report Status PENDING  Incomplete      Radiology Studies: No results found.  Scheduled Meds: . aspirin  81 mg Oral Daily  . enoxaparin (LOVENOX) injection  40 mg Subcutaneous Q24H  .  memantine  10 mg Oral BID  . polycarbophil  625 mg Oral QHS  . pravastatin  60 mg Oral QHS  . primidone  250 mg Oral BID  . sodium chloride flush  3 mL Intravenous Q12H   Continuous Infusions:    LOS: 3 days   Time spent: 35 minutes.  Patrecia Pour, MD [facility] [email or web address] 08/27/2019, 11:31 AM

## 2019-08-27 NOTE — Evaluation (Signed)
Occupational Therapy Evaluation Patient Details Name: Nicole Hess MRN: [id number] DOB: [birth year] Today's Date: 08/27/2019    History of Present Illness Nicole Hess is a 73 y.o. female with a history of AS s/p AVR 2016, CAD, dementia, depression, gout, and recent covid-19 infection diagnosed 12/7 who presented from [place] at [facility] to ED 12/9 for dyspnea and toe discoloration. Labs demonstrated hypokalemia, hypoalbuminemia, pancytopenia, and modest elevation of troponin to 25. Due to nausea, abdominal CT was performed which demonstrated bibasilar GGOs consistent with pneumonia due to known covid-19. Right foot XR showed soft tissue swelling of the dorsum without bony abnormality.   Clinical Impression   Pt from [facility] unit. Attempted x2 to contact them [phone]) to obtain PLOF and could not get in touch with anyone. Today Pt was found standing upon OT entry to room tangled in lines. Pt was confused but easily directed to sit on BSC with min A. She needed constant re-orienting, but was pleasant and cooperative. Pt mod A for sponge bath while sitting on commode. No urine or BM after 15 min, max A for peri care - but Pt able to complete standing. Min A hand held assist to recliner on the other side of the room. SpO2 when reading down to 74% at times - but on toe and poor pleth - Pt a little short of breath for very short periods of time, but overall happy to be working in therapy. Pt in chair with alarm and tray over her lap at end of session, 2L O2 in nose and SpO2 100%. OT will follow acutely to maximize safety and independence in ADL and functional transfers.     Follow Up Recommendations  SNF(return to [facility] where her memory unit is)    Equipment Recommendations  None recommended by OT(defer to SNF)    Recommendations for Other Services       Precautions / Restrictions Precautions Precautions: Fall Precaution Comments: confusion -  baseline dementia Restrictions Weight Bearing Restrictions: No      Mobility Bed Mobility Overal bed mobility: Needs Assistance Bed Mobility: Supine to Sit     Supine to sit: Min guard     General bed mobility comments: min guard and assist for line management  Transfers Overall transfer level: Needs assistance Equipment used: 1 person hand held assist Transfers: Sit to/from Stand Sit to Stand: Min assist         General transfer comment: follows simple commands    Balance Overall balance assessment: Mild deficits observed, not formally tested                                         ADL either performed or assessed with clinical judgement   ADL Overall ADL's : Needs assistance/impaired Eating/Feeding: Minimal assistance;Sitting   Grooming: Wash/dry hands;Oral care;Minimal assistance;Sitting Grooming Details (indicate cue type and reason): assist to initiate tasks,  Upper Body Bathing: Moderate assistance;Sitting   Lower Body Bathing: Moderate assistance;Sit to/from stand   Upper Body Dressing : Minimal assistance   Lower Body Dressing: Maximal assistance   Toilet Transfer: Minimal assistance;Ambulation(HHA) Toilet Transfer Details (indicate cue type and reason): HHA, verbal cues for initiation Toileting- Clothing Manipulation and Hygiene: Maximal assistance;Sit to/from stand Toileting - Clothing Manipulation Details (indicate cue type and reason): Pt able to maintain standing, confused by gown, assist to manage clothing and perform peri care  Functional mobility during ADLs: Minimal assistance(HHA) General ADL Comments: RR increase, SpO2 poor reading (bad wave form)     Vision         Perception     Praxis      Pertinent Vitals/Pain Pain Assessment: No/denies pain     Hand Dominance     Extremity/Trunk Assessment Upper Extremity Assessment Upper Extremity Assessment: Generalized weakness   Lower Extremity Assessment Lower  Extremity Assessment: Generalized weakness(BLE feet edema)   Cervical / Trunk Assessment Cervical / Trunk Assessment: Normal   Communication Communication Communication: No difficulties   Cognition Arousal/Alertness: Awake/alert Behavior During Therapy: WFL for tasks assessed/performed Overall Cognitive Status: History of cognitive impairments - at baseline                                 General Comments: baseline dementia, from memory care unit, able to follow simple commands. Appropriately confused throughout session   General Comments       Exercises     Shoulder Instructions      Home Living Family/patient expects to be discharged to:: Skilled nursing facility(Memory Care Unit)                                        Prior Functioning/Environment   unsure - attempted [facility] x2                OT Problem List: Impaired balance (sitting and/or standing);Decreased activity tolerance;Cardiopulmonary status limiting activity      OT Treatment/Interventions:      OT Goals(Current goals can be found in the care plan section) Acute Rehab OT Goals Patient Stated Goal: none stated OT Goal Formulation: Patient unable to participate in goal setting Time For Goal Achievement: 09/10/19 Potential to Achieve Goals: Good ADL Goals Pt Will Perform Grooming: standing;with min guard assist Pt Will Perform Upper Body Dressing: with set-up;sitting Pt Will Perform Lower Body Dressing: with supervision;sit to/from stand Pt Will Transfer to Toilet: with min guard assist;ambulating Pt Will Perform Toileting - Clothing Manipulation and hygiene: sit to/from stand;with supervision  OT Frequency: Min 2X/week   Barriers to D/C:            Co-evaluation              AM-PAC OT "6 Clicks" Daily Activity     Outcome Measure Help from another person eating meals?: A Little Help from another person taking care of personal grooming?: A  Little Help from another person toileting, which includes using toliet, bedpan, or urinal?: A Lot Help from another person bathing (including washing, rinsing, drying)?: A Lot Help from another person to put on and taking off regular upper body clothing?: A Little Help from another person to put on and taking off regular lower body clothing?: A Lot 6 Click Score: 15   End of Session Equipment Utilized During Treatment: Gait belt(in room) Nurse Communication: Mobility status;Other (comment)(no pee or BM, SpO2)  Activity Tolerance: Patient tolerated treatment well Patient left: in chair;with call bell/phone within reach;with chair alarm set(tray over patient to encourage staying in chair)  OT Visit Diagnosis: Unsteadiness on feet (R26.81);Other abnormalities of gait and mobility (R26.89);Muscle weakness (generalized) (M62.81)                Time: 1000-1030 OT Time Calculation (min): 30 min Charges:  OT  General Charges $OT Visit: 1 Visit OT Evaluation $OT Eval Moderate Complexity: 1 Mod OT Treatments $Self Care/Home Management : 8-22 mins  Hulda Humphrey OTR/L Acute Rehabilitation Services Pager: [phone] Office: [place] 08/27/2019, 1:51 PM

## 2019-08-28 ENCOUNTER — Inpatient Hospital Stay (HOSPITAL_COMMUNITY): Payer: PPO

## 2019-08-28 DIAGNOSIS — U071 COVID-19: Secondary | ICD-10-CM

## 2019-08-28 DIAGNOSIS — R7989 Other specified abnormal findings of blood chemistry: Secondary | ICD-10-CM

## 2019-08-28 LAB — C-REACTIVE PROTEIN: CRP: 3.5 mg/dL — ABNORMAL HIGH (ref <1.0)

## 2019-08-28 LAB — CBC WITH DIFFERENTIAL/PLATELET
Abs Immature Granulocytes: 0.01 10*3/uL (ref 0.00–0.07)
Basophils Absolute: 0 10*3/uL (ref 0.0–0.1)
Basophils Relative: 0 %
Eosinophils Absolute: 0 10*3/uL (ref 0.0–0.5)
Eosinophils Relative: 1 %
HCT: 35.9 % — ABNORMAL LOW (ref 36.0–46.0)
Hemoglobin: 11.4 g/dL — ABNORMAL LOW (ref 12.0–15.0)
Immature Granulocytes: 0 %
Lymphocytes Relative: 25 %
Lymphs Abs: 1.1 10*3/uL (ref 0.7–4.0)
MCH: 30.6 pg (ref 26.0–34.0)
MCHC: 31.8 g/dL (ref 30.0–36.0)
MCV: 96.5 fL (ref 80.0–100.0)
Monocytes Absolute: 0.5 10*3/uL (ref 0.1–1.0)
Monocytes Relative: 10 %
Neutro Abs: 2.8 10*3/uL (ref 1.7–7.7)
Neutrophils Relative %: 64 %
Platelets: 194 10*3/uL (ref 150–400)
RBC: 3.72 MIL/uL — ABNORMAL LOW (ref 3.87–5.11)
RDW: 14 % (ref 11.5–15.5)
WBC: 4.4 10*3/uL (ref 4.0–10.5)
nRBC: 0 % (ref 0.0–0.2)

## 2019-08-28 LAB — COMPREHENSIVE METABOLIC PANEL
ALT: 20 U/L (ref 0–44)
AST: 37 U/L (ref 15–41)
Albumin: 2.9 g/dL — ABNORMAL LOW (ref 3.5–5.0)
Alkaline Phosphatase: 94 U/L (ref 38–126)
Anion gap: 9 (ref 5–15)
BUN: 22 mg/dL (ref 8–23)
CO2: 28 mmol/L (ref 22–32)
Calcium: 8.2 mg/dL — ABNORMAL LOW (ref 8.9–10.3)
Chloride: 107 mmol/L (ref 98–111)
Creatinine, Ser: 0.54 mg/dL (ref 0.44–1.00)
GFR calc Af Amer: >60 mL/min (ref >60)
GFR calc non Af Amer: >60 mL/min (ref >60)
Glucose, Bld: 88 mg/dL (ref 70–99)
Potassium: 4.3 mmol/L (ref 3.5–5.1)
Sodium: 144 mmol/L (ref 135–145)
Total Bilirubin: 0.4 mg/dL (ref 0.3–1.2)
Total Protein: 6.1 g/dL — ABNORMAL LOW (ref 6.5–8.1)

## 2019-08-28 LAB — MAGNESIUM: Magnesium: 2 mg/dL (ref 1.7–2.4)

## 2019-08-28 LAB — D-DIMER, QUANTITATIVE: D-Dimer, Quant: 1.17 ug/mL-FEU — ABNORMAL HIGH (ref 0.00–0.50)

## 2019-08-28 LAB — FERRITIN: Ferritin: 206 ng/mL (ref 11–307)

## 2019-08-28 MED ORDER — TAMSULOSIN HCL 0.4 MG PO CAPS
0.4000 mg | ORAL_CAPSULE | Freq: Every day | ORAL | Status: DC
  Administered 2019-08-28 – 2019-08-30 (×3): 0.4 mg via ORAL
  Filled 2019-08-28 (×3): qty 1

## 2019-08-28 MED ORDER — ADULT MULTIVITAMIN W/MINERALS CH
1.0000 | ORAL_TABLET | Freq: Every day | ORAL | Status: DC
  Administered 2019-08-28 – 2019-08-30 (×3): 1 via ORAL
  Filled 2019-08-28 (×2): qty 1

## 2019-08-28 MED ORDER — ENSURE ENLIVE PO LIQD
237.0000 mL | Freq: Two times a day (BID) | ORAL | Status: DC
  Administered 2019-08-28 – 2019-08-30 (×4): 237 mL via ORAL

## 2019-08-28 NOTE — Progress Notes (Signed)
PROGRESS NOTE  Nicole Hess  [id number] DOB: [birth year] DOA: 08/23/2019 PCP: Reymundo Poll, MD   Brief Narrative: Nicole Hess is a 73 y.o. female with a history of AS s/p AVR 2016, CAD, dementia, depression, gout, and recent covid-19 infection diagnosed 12/7 who presented from [place] at [facility] to ED 12/9 for dyspnea and toe discoloration. She was not noted to be hypoxic, and CXR showed no infiltrate. Labs demonstrated hypokalemia, hypoalbuminemia, pancytopenia, and modest elevation of troponin to 25. Due to nausea, abdominal CT was performed which demonstrated bibasilar GGOs consistent with pneumonia due to known covid-19. Right foot XR showed soft tissue swelling of the dorsum without bony abnormality. She was admitted and ultimately started on remdesivir and steroids.   Assessment & Plan: Active Problems:   Depression   Hyperlipidemia   S/P AVR (aortic valve replacement)   Dementia without behavioral disturbance (HCC)   Hypocalcemia   COVID-19 virus infection   Hypokalemia  Covid-19 pneumonia: At high risk of decompensation. Note she's entering into 2nd week of illness which is time most likely to see deterioration. Will continue remdesivir to complete course given infiltrates present on CT. - CRP downtrending, PCT negative.  - Continued remdesivir x5 days, completed course. Steroids stopped due to no hypoxia.  - Continue airborne, contact precautions.  - Enoxaparin prophylactic dose  - Blood cultures drawn, NGTD x4.  - Maintain euvolemia/net negative  - Avoid NSAIDs - Recommend incentive spirometry. - Repeat CXR if SOB returns.  Pancytopenia: Improving. Likely virus contributing.  Bacteriuria, acute urinary retention: And bladder distention on CT.  - Repeat bladder scan, I&O cath if >300cc. Start tamsulosin. If not voiding spontaneously, will need foley and urology follow up.  - Urine culture pending.  Dementia:  - Continue namenda - Delirium  precautions  Hyperlipidemia:  - Continue statin  CAD with demand ischemia: No chest pain or ischemic ECG changes noted. - Continue ASA, statin  Seizure disorder:  - On primidone  Hypokalemia:  - Resolved.  Hypoalbuminemia:  - Dietitian consult  Hypocalcemia: Resolved.  Right foot swelling > left foot: Concern for covid toes on arrival, though extremities documented to be warm with pulses throughout hospitalization. Venous U/S does not show DVT bilaterally. XR only with soft tissue swelling. Suspect due to venous stasis.  - Elevate extremities, compression hose.   Generalized weakness:  - PT/OT as covid may have impacted functional status to the point she needs rehabilitation.  DVT prophylaxis: Lovenox Code Status: DNR on admission Family Communication: None at bedside, spoke with [facility]CPOA by phone. Disposition Plan: Will need PT/OT evaluations. Will also start tamsulosin in hopes of avoiding inserting foley catheter. If fails voiding trials x24 hrs, will insert foley and would discharge 12/15.   Consultants:   None  Procedures:   None  Antimicrobials:  Remdesivir   Subjective: Confused without complaints. Has continued to require I/O cathing.   Objective: Vitals:   08/28/19 0400 08/28/19 0712 08/28/19 1400 08/28/19 1612  BP: 110/63 127/66 113/62 115/68  Pulse: 74 70  78  Resp: 18 20  20   Temp: (!) 97.3 F (36.3 C) 98.3 F (36.8 C)  98.3 F (36.8 C)  TempSrc: Axillary Oral  Oral  SpO2: 98% 98%  97%  Height:        Intake/Output Summary (Last 24 hours) at 08/28/2019 1653 Last data filed at 08/28/2019 1300 Gross per 24 hour  Intake 370 ml  Output 550 ml  Net -180 ml   Gen: 73 y.o.  female in no distress Pulm: Nonlabored breathing room air. Clear. CV: Regular rate and rhythm. No murmur, rub, or gallop. No JVD, stable dependent edema. GI: Abdomen soft, non-tender, no suprapubic tenderness, non-distended, with normoactive bowel sounds.  Ext: Warm, no  deformities Skin: No new rashes, lesions or ulcers on visualized skin. Neuro: Alert, conversant without dysarthria, disoriented. No focal neurological deficits. Psych: Judgement and insight appear fair. Mood euthymic & affect congruent. Behavior is appropriate.    Data Reviewed: I have personally reviewed following labs and imaging studies  CBC: Recent Labs  Lab 08/23/19 1552 08/24/19 0500 08/25/19 0321 08/27/19 0211 08/28/19 0240  WBC 2.7* 3.5* 4.7 4.2 4.4  NEUTROABS  --  2.6 3.2 2.5 2.8  HGB 10.5* 12.4 11.2* 12.1 11.4*  HCT 33.1* 38.1 33.8* 37.9 35.9*  MCV 99.1 96.2 94.2 95.9 96.5  PLT 123* 154 163 187 [id number]   Basic Metabolic Panel: Recent Labs  Lab 08/23/19 1552 08/23/19 1600 08/23/19 2105 08/24/19 0500 08/25/19 0321 08/27/19 0211 08/28/19 0240  NA 144  --   --  141 140 143 144  K 3.1*  --   --  3.7 3.4* 3.3* 4.3  CL 118*  --   --  103 104 106 107  CO2 17*  --   --  26 19* 30 28  GLUCOSE 68*  --   --  103* 89 113* 88  BUN 17  --   --  14 16 22 22   CREATININE 0.40* 0.30*  --  0.61 0.64 0.57 0.54  CALCIUM 6.1*  --   --  8.3* 8.2* 8.2* 8.2*  MG  --   --  1.9 1.8 1.7 2.2 2.0  PHOS  --   --  2.9  --   --   --   --    Liver Function Tests: Recent Labs  Lab 08/23/19 1552 08/24/19 0500 08/25/19 0321 08/27/19 0211 08/28/19 0240  AST 31 31 34 33 37  ALT 13 19 17 19 20   ALKPHOS 84 106 104 99 94  BILITOT 0.8 0.3 1.1 0.6 0.4  PROT 4.9* 6.3* 5.9* 6.6 6.1*  ALBUMIN 2.4* 3.0* 3.0* 3.0* 2.9*   Recent Labs  Lab 08/23/19 1552  LIPASE 20   Anemia Panel: Recent Labs    08/27/19 0211 08/28/19 0240  FERRITIN 227 206   Urine analysis:    Component Value Date/Time   COLORURINE YELLOW 08/23/2019 2246   APPEARANCEUR CLEAR 08/23/2019 2246   LABSPEC 1.036 (H) 08/23/2019 2246   PHURINE 5.0 08/23/2019 2246   GLUCOSEU NEGATIVE 08/23/2019 2246   GLUCOSEU NEGATIVE 11/06/2016 1022   HGBUR NEGATIVE 08/23/2019 2246   BILIRUBINUR NEGATIVE 08/23/2019 2246   BILIRUBINUR N  07/04/2019 1119   KETONESUR 5 (A) 08/23/2019 2246   PROTEINUR NEGATIVE 08/23/2019 2246   UROBILINOGEN 0.2 07/04/2019 1119   UROBILINOGEN 0.2 05/08/2018 1732   NITRITE NEGATIVE 08/23/2019 2246   LEUKOCYTESUR TRACE (A) 08/23/2019 2246   Recent Results (from the past 240 hour(s))  SARS CORONAVIRUS 2 (TAT 6-24 HRS) Nasopharyngeal Nasopharyngeal Swab     Status: Abnormal   Collection Time: 08/21/19  9:10 AM   Specimen: Nasopharyngeal Swab  Result Value Ref Range Status   SARS Coronavirus 2 POSITIVE (A) NEGATIVE Final    Comment: EMAILED LORI BERDIK [id number] 08/21/2019 MCCORMICK K (NOTE) SARS-CoV-2 target nucleic acids are DETECTED. The SARS-CoV-2 RNA is generally detectable in upper and lower respiratory specimens during the acute phase of infection. Positive results are indicative of the presence of  SARS-CoV-2 RNA. Clinical correlation with patient history and other diagnostic information is  necessary to determine patient infection status. Positive results do not rule out bacterial infection or co-infection with other viruses.  The expected result is Negative. Fact Sheet for Patients: SugarRoll.be Fact Sheet for Healthcare Providers: https://www.woods-mathews.com/ This test is not yet approved or cleared by the [place] FDA and  has been authorized for detection and/or diagnosis of SARS-CoV-2 by FDA under an Emergency Use Authorization (EUA). This EUA will remain  in effect (meaning this test can be used) for the duration of the COVID-19 declaration  under Section 564(b)(1) of the Act, 21 U.S.C. section 360bbb-3(b)(1), unless the authorization is terminated or revoked sooner. Performed at [facility], [place] [address], [place], [place] [address]   Culture, blood (Routine X 2) w Reflex to ID Panel     Status: None (Preliminary result)   Collection Time: 08/24/19  1:00 AM   Specimen: BLOOD  Result Value Ref Range Status   Specimen  Description BLOOD RIGHT ANTECUBITAL  Final   Special Requests   Final    BOTTLES DRAWN AEROBIC AND ANAEROBIC Blood Culture adequate volume   Culture   Final    NO GROWTH 4 DAYS Performed at [facility], [place] [address], [place], [place] [address]    Report Status PENDING  Incomplete  Culture, blood (Routine X 2) w Reflex to ID Panel     Status: None (Preliminary result)   Collection Time: 08/24/19  5:05 AM   Specimen: BLOOD LEFT HAND  Result Value Ref Range Status   Specimen Description BLOOD LEFT HAND  Final   Special Requests   Final    BOTTLES DRAWN AEROBIC ONLY Blood Culture results may not be optimal due to an inadequate volume of blood received in culture bottles   Culture   Final    NO GROWTH 4 DAYS Performed at [facility], [place] [address], [place], [place] [address]    Report Status PENDING  Incomplete      Radiology Studies: VAS [place] LOWER EXTREMITY VENOUS (DVT)  Result Date: 08/28/2019  Lower Venous Study Indications: Elevated Ddimer.  Risk Factors: COVID 19 positive. Limitations: Poor ultrasound/tissue interface and patient positioning, patient immobility. Comparison Study: No prior studies. Performing Technologist: Oliver Hum RVT  Examination Guidelines: A complete evaluation includes B-mode imaging, spectral Doppler, color Doppler, and power Doppler as needed of all accessible portions of each vessel. Bilateral testing is considered an integral part of a complete examination. Limited examinations for reoccurring indications may be performed as noted.  +---------+---------------+---------+-----------+----------+--------------+ RIGHT    CompressibilityPhasicitySpontaneityPropertiesThrombus Aging +---------+---------------+---------+-----------+----------+--------------+ CFV      Full           Yes      Yes                                 +---------+---------------+---------+-----------+----------+--------------+ SFJ      Full                                                         +---------+---------------+---------+-----------+----------+--------------+ FV Prox  Full                                                        +---------+---------------+---------+-----------+----------+--------------+  FV Mid   Full                                                        +---------+---------------+---------+-----------+----------+--------------+ FV DistalFull                                                        +---------+---------------+---------+-----------+----------+--------------+ PFV      Full                                                        +---------+---------------+---------+-----------+----------+--------------+ POP      Full           Yes      Yes                                 +---------+---------------+---------+-----------+----------+--------------+ PTV      Full                                                        +---------+---------------+---------+-----------+----------+--------------+ PERO     Full                                                        +---------+---------------+---------+-----------+----------+--------------+   +---------+---------------+---------+-----------+----------+--------------+ LEFT     CompressibilityPhasicitySpontaneityPropertiesThrombus Aging +---------+---------------+---------+-----------+----------+--------------+ CFV      Full           Yes      Yes                                 +---------+---------------+---------+-----------+----------+--------------+ SFJ      Full                                                        +---------+---------------+---------+-----------+----------+--------------+ FV Prox  Full                                                        +---------+---------------+---------+-----------+----------+--------------+ FV Mid   Full                                                         +---------+---------------+---------+-----------+----------+--------------+  FV DistalFull                                                        +---------+---------------+---------+-----------+----------+--------------+ PFV      Full                                                        +---------+---------------+---------+-----------+----------+--------------+ POP                                                   Not visualized +---------+---------------+---------+-----------+----------+--------------+ PTV      Full                                                        +---------+---------------+---------+-----------+----------+--------------+ PERO     Full                                                        +---------+---------------+---------+-----------+----------+--------------+     Summary: Right: There is no evidence of deep vein thrombosis in the lower extremity. However, portions of this examination were limited- see technologist comments above. No cystic structure found in the popliteal fossa. Left: There is no evidence of deep vein thrombosis in the lower extremity. However, portions of this examination were limited- see technologist comments above. No cystic structure found in the popliteal fossa.  *See table(s) above for measurements and observations.    Preliminary     Scheduled Meds: . aspirin  81 mg Oral Daily  . Chlorhexidine Gluconate Cloth  6 each Topical Daily  . enoxaparin (LOVENOX) injection  40 mg Subcutaneous Q24H  . feeding supplement (ENSURE ENLIVE)  237 mL Oral BID BM  . memantine  10 mg Oral BID  . multivitamin with minerals  1 tablet Oral Daily  . polycarbophil  625 mg Oral QHS  . pravastatin  60 mg Oral QHS  . primidone  250 mg Oral BID  . sodium chloride flush  3 mL Intravenous Q12H  . tamsulosin  0.4 mg Oral Daily   Continuous Infusions:    LOS: 4 days   Time spent: 35 minutes.  Patrecia Pour, MD [facility] [email or web address] 08/28/2019, 4:53 PM

## 2019-08-28 NOTE — Progress Notes (Signed)
No family updated, SW and Living CTr updated

## 2019-08-28 NOTE — Progress Notes (Signed)
PT Cancellation Note  Patient Details Name: Nicole Hess MRN: [id number] DOB: 12-25-1945   Cancelled Treatment:    Reason Eval/Treat Not Completed: Patient not medically ready  Noted MD ordered doppler US to rule out DVT RLE. Noted her planned discharge to ALF is now ? Due to facility cannot measure her urine output. Spoke with RN and agreed to hold PT evaluation until doppler completed.   Will monitor chart for doppler results and proceed with eval as appropriate.    Barry Brunner, PT Pager [phone]   Rexanne Mano 08/28/2019, 10:43 AM

## 2019-08-28 NOTE — Progress Notes (Signed)
Bilateral lower extremity venous duplex has been completed. Preliminary results can be found in CV Proc through chart review.   08/28/19 2:13 PM Carlos Levering RVT

## 2019-08-28 NOTE — Care Management Important Message (Signed)
Important Message  Patient Details  Name: Nicole Hess MRN: [id number] Date of Birth: Jun 30, 1946   Medicare Important Message Given:  Yes - Important Message mailed due to current National Emergency  Verbal consent obtained due to current National Emergency  Relationship to patient: Friend Contact Name: Jefm Miles Call Date: 08/28/19  Time: 1235 Phone: [phone] Outcome: Spoke with contact Important Message mailed to: Patient address on file    Delorse Lek 08/28/2019, 12:36 PM

## 2019-08-28 NOTE — Evaluation (Signed)
Physical Therapy Evaluation Patient Details Name: Nicole Hess MRN: [id number] DOB: 29-Feb-1946 Today's Date: 08/28/2019   History of Present Illness  Nicole Hess is a 73 y.o. female with a history of AS s/p AVR 2016, CAD, dementia, depression, gout, and recent covid-19 infection diagnosed 12/7 who presented from [place] at [facility] to ED 12/9 for dyspnea and toe discoloration. Labs demonstrated hypokalemia, hypoalbuminemia, pancytopenia, and modest elevation of troponin to 25. Due to nausea, abdominal CT was performed which demonstrated bibasilar GGOs consistent with pneumonia due to known covid-19. Right foot XR showed soft tissue swelling of the dorsum without bony abnormality. Doppler LE negative for DVT  Clinical Impression   Pt admitted with above diagnosis. Patient sleeping on arrival, awakened easily however groggy inittially. Therefore required incr facilitation initially to come to EOB. Once standing and walking she was easily directed to walk around room to find the towel and tissues she wanted. She required guidance due to dementia. She normally walks without a device and without falls per [employer] staff.  Pt currently with functional limitations due to the deficits listed below (see PT Problem List). Pt may benefit from skilled PT to return to their prior functional status. Will reassess again due to sleepiness today resulting in needing min guard assistance. Expect discharge to her memory care unit will be most appropriate as it will be a familiar environment and staff.     Follow Up Recommendations Supervision/Assistance - 24 hour;No PT follow up (Return to her memory care unit)    Equipment Recommendations  None recommended by PT    Recommendations for Other Services       Precautions / Restrictions Precautions Precautions: Fall Precaution Comments: confusion - baseline dementia Restrictions Weight Bearing Restrictions: No      Mobility  Bed  Mobility Overal bed mobility: Needs Assistance Bed Mobility: Supine to Sit;Sit to Supine     Supine to sit: Min assist Sit to supine: Supervision   General bed mobility comments: pt sleeping on arrival, however RN wanted pt awakened and she easily aroused; assist/facilitation as she was groggy  Transfers Overall transfer level: Needs assistance Equipment used: 1 person hand held assist Transfers: Sit to/from Stand Sit to Stand: Min assist         General transfer comment: follows simple commands  Ambulation/Gait Ambulation/Gait assistance: Min guard Gait Distance (Feet): 40 Feet Assistive device: None Gait Pattern/deviations: Step-to pattern;Step-through pattern;Decreased stride length;Shuffle     General Gait Details: pt hesitant and required guiding/facilitation to walk to get the tissues she wanted; no LOB  Stairs            Wheelchair Mobility    Modified Rankin (Stroke Patients Only)       Balance Overall balance assessment: Mild deficits observed, not formally tested(initial steps due to grogginess)                                           Pertinent Vitals/Pain Pain Assessment: Faces Faces Pain Scale: No hurt    Home Living Family/patient expects to be discharged to:: Other (Comment)(Memory Care Unit)                      Prior Function Level of Independence: Needs assistance   Gait / Transfers Assistance Needed: independent no device; no falls  ADL's / Homemaking Assistance Needed: set up and  supervision to min assist (bathing, dressing, eating); sometimes hand over hand guidance to correct sequence or stay on task (eating)  Comments: spoke with RN at [facility] to obtain prior status     Hand Dominance        Extremity/Trunk Assessment   Upper Extremity Assessment Upper Extremity Assessment: Defer to OT evaluation    Lower Extremity Assessment Lower Extremity Assessment: Generalized weakness     Cervical / Trunk Assessment Cervical / Trunk Assessment: Normal  Communication   Communication: Expressive difficulties(very low volume;)  Cognition Arousal/Alertness: Lethargic Behavior During Therapy: WFL for tasks assessed/performed Overall Cognitive Status: History of cognitive impairments - at baseline                                 General Comments: baseline dementia, from memory care unit, able to follow simple commands. Appropriately confused throughout session      General Comments      Exercises     Assessment/Plan    PT Assessment Patient needs continued PT services  PT Problem List Decreased strength;Decreased mobility;Decreased cognition       PT Treatment Interventions Gait training;Functional mobility training;Therapeutic activities;Balance training;Cognitive remediation;Patient/family education    PT Goals (Current goals can be found in the Care Plan section)  Acute Rehab PT Goals Patient Stated Goal: none stated PT Goal Formulation: Patient unable to participate in goal setting Time For Goal Achievement: 09/11/19 Potential to Achieve Goals: Fair    Frequency Min 2X/week   Barriers to discharge        Co-evaluation               AM-PAC PT "6 Clicks" Mobility  Outcome Measure Help needed turning from your back to your side while in a flat bed without using bedrails?: A Little Help needed moving from lying on your back to sitting on the side of a flat bed without using bedrails?: A Little Help needed moving to and from a bed to a chair (including a wheelchair)?: A Little Help needed standing up from a chair using your arms (e.g., wheelchair or bedside chair)?: A Little Help needed to walk in hospital room?: A Little Help needed climbing 3-5 steps with a railing? : A Lot 6 Click Score: 17    End of Session   Activity Tolerance: Patient tolerated treatment well Patient left: in bed;with call bell/phone within reach;with bed  alarm set Nurse Communication: Mobility status PT Visit Diagnosis: Other abnormalities of gait and mobility (R26.89);Muscle weakness (generalized) (M62.81)    Time: [id number] PT Time Calculation (min) (ACUTE ONLY): 36 min   Charges:   PT Evaluation $PT Eval Low Complexity: 1 Low PT Treatments $Therapeutic Activity: 8-22 mins         Barry Brunner, PT Pager [phone]   Rexanne Mano 08/28/2019, 5:46 PM

## 2019-08-28 NOTE — TOC Progression Note (Signed)
Transition of Care [facility]) - Progression Note    Patient Details  Name: Nicole Hess MRN: [id number] Date of Birth: 10-03-1945  Transition of Care [facility] CM/SW Contact  Ninfa Meeker, RN Phone Number: [phone] (Working remotely) 08/28/2019, 9:57 AM  Clinical Narrative:   Case manager contacted [facility], and spoke with Clarene Critchley, nurse, concerning patient returning to the residence. Per MD she would need I & O cath with urine output monitoring and bladder scanning.  Clarene Critchley stated that they are not able to provide this care. They do not monitor output. Should patient return with foley they still can not monitor her output. Case manager updated MD with this information. Patient does not meet requirements for SNF, she is ambulating independently.        Expected Discharge Plan and Services           Expected Discharge Date: 08/28/19                                     Social Determinants of Health (SDOH) Interventions    Readmission Risk Interventions No flowsheet data found.

## 2019-08-28 NOTE — Progress Notes (Signed)
Initial Nutrition Assessment  DOCUMENTATION CODES:   Not applicable  INTERVENTION:   -Ensure Enlive po BID, each supplement provides 350 kcal and 20 grams of protein -MVI with minerals -Liberalize diet to regular -Continue Magic Cup BID, each supplement provides 290 kcal and 9 grams of protein   NUTRITION DIAGNOSIS:   Increased nutrient needs related to acute illness (Covid-19), chronic illness(dementia) as evidenced by estimated needs.  GOAL:   Patient will meet greater than or equal to 90% of their needs  MONITOR:   PO intake, Supplement acceptance, Diet advancement, Labs, Weight trends, I & O's  REASON FOR ASSESSMENT:   Consult Poor PO  ASSESSMENT:   73 year old pt admitted with COVID-19 and dyspnea with PMH of aortic stenosis s/p AVR, CAD, depression, gout and dementia. Pt found to have pneumonia.  Unable to contact RN or pt via phone.   Current wt is not available in chart. Charted wt from 10/18/18 is 66.2 kg. This is about a 10% wt loss in 10 months which is significant. New wt is needed. Pt is at risk for malnutrition if wt continues to trend down.  Pt eating >75% of meals.   Medications: fibercon  Labs: Potassium 4.3  NUTRITION - FOCUSED PHYSICAL EXAM:    Most Recent Value  Orbital Region  Unable to assess  Upper Arm Region  Unable to assess  Thoracic and Lumbar Region  Unable to assess  Buccal Region  Unable to assess  [name] Region  Unable to assess  Clavicle Bone Region  Unable to assess  Clavicle and Acromion Bone Region  Unable to assess  Scapular Bone Region  Unable to assess  Dorsal Hand  Unable to assess  Patellar Region  Unable to assess  Anterior Thigh Region  Unable to assess  Posterior Calf Region  Unable to assess  Edema (RD Assessment)  Unable to assess  Hair  Unable to assess  Eyes  Unable to assess  Mouth  Unable to assess  Skin  Unable to assess  Nails  Unable to assess       Diet Order:   Diet Order            Diet Heart  Room service appropriate? Yes; Fluid consistency: Thin  Diet effective now              EDUCATION NEEDS:   Not appropriate for education at this time  Skin:  Skin Assessment: Reviewed RN Assessment  Last BM:  12/13  Height:   Ht Readings from Last 1 Encounters:  08/26/19 5\' 6"  (1.676 m)    Weight:   Wt Readings from Last 1 Encounters:  08/01/19 59.7 kg    Ideal Body Weight:  59.1 kg  BMI:  Body mass index is 21.24 kg/m.  Estimated Nutritional Needs:   Kcal:  1700-1900  Protein:  85-95  Fluid:  >1.7L    Allen Norris Dietetic Intern Pager # [phone]

## 2019-08-29 LAB — CULTURE, BLOOD (ROUTINE X 2)
Culture: NO GROWTH
Culture: NO GROWTH
Special Requests: ADEQUATE

## 2019-08-29 LAB — URINE CULTURE: Culture: NO GROWTH

## 2019-08-29 MED ORDER — FOSFOMYCIN TROMETHAMINE 3 G PO PACK
3.0000 g | PACK | Freq: Once | ORAL | Status: AC
  Administered 2019-08-29: 3 g via ORAL
  Filled 2019-08-29 (×2): qty 3

## 2019-08-29 MED ORDER — TAMSULOSIN HCL 0.4 MG PO CAPS: 0.4000 mg | ORAL_CAPSULE | Freq: Every day | ORAL | Status: DC

## 2019-08-29 NOTE — Progress Notes (Signed)
PT does not have family.

## 2019-08-29 NOTE — Progress Notes (Signed)
LT message for Nicole Hess, SW PT [facility], DC paperwork must be faxed to facility. Fax Number [phone]. Number directly to unit is [phone] and I spoke with [place], Rn there

## 2019-08-29 NOTE — Progress Notes (Signed)
Called to give report to RN: Network engineer said another RN would return my call for report.

## 2019-08-29 NOTE — Progress Notes (Signed)
Spoke with Nicole Hess @ [facility] to possible D/C

## 2019-08-29 NOTE — Discharge Summary (Signed)
Physician Discharge Summary  DANNIELL MORVANT [id number] DOB: 1945-09-30 DOA: 08/23/2019  PCP: Reymundo Poll, MD  Admit date: 08/23/2019 Discharge date: 08/29/2019  Admitted From: Memory Care Disposition: Memory Care   Recommendations for Outpatient Follow-up:  1. Follow up with PCP in 1-2 weeks 2. Please obtain BMP/CBC in one week 3. Please follow up on urinary retention. Discharged with foley catheter placed 12/15 after starting tamsulosin. Note pt had distended bladder on admission CT and retention while admitted as well. Recommend urology follow up in 1-2 weeks.  Home Health: None new Equipment/Devices: None new Discharge Condition: Stable CODE STATUS: DNR Diet recommendation: Heart healthy  Brief/Interim Summary: KARRON KOVACEVIC is a 73 y.o. female with a history of AS s/p AVR 2016, CAD, dementia, depression, gout, and recent covid-19 infection diagnosed 12/7 who presented from [place] at [facility] to ED 12/9 for dyspnea and toe discoloration. She was not noted to be hypoxic, and CXR showed no infiltrate. Labs demonstrated hypokalemia, hypoalbuminemia, pancytopenia, and modest elevation of troponin to 25. Due to nausea, abdominal CT was performed which demonstrated bibasilar GGOs consistent with pneumonia due to known covid-19. Right foot XR showed soft tissue swelling of the dorsum without bony abnormality. She was admitted and ultimately started on remdesivir and steroids with completion of therapy and sustained improvement. Note admission CT also demonstrated distended bladder and pt demonstrated retention, recommend urology follow up.  Discharge Diagnoses:  Active Problems:   Depression   Hyperlipidemia   S/P AVR (aortic valve replacement)   Dementia without behavioral disturbance (HCC)   Hypocalcemia   COVID-19 virus infection   Hypokalemia  Covid-19 pneumonia: - Completed remdesivir x5 days. CRP downtrending, PCT negative.  - Steroids stopped due to no  hypoxia.  - Continue isolation for 21 days from positive test result. - Blood cultures drawn, NGTD at discharge. - Maintain euvolemia/net negative  - Avoid NSAIDs - Recommend incentive spirometry.  Pancytopenia: Improving. Likely virus contributing.  Bacteriuria, acute urinary retention: And bladder distention on CT.  - Started tamsulosin. If not voiding spontaneously, will need foley and urology follow up.  - Urine culture pending, given dose of fosfomycin 12/15.   Dementia:  - Continue namenda - Delirium precautions  Hyperlipidemia:  - Continue statin  CAD with demand ischemia: No chest pain or ischemic ECG changes noted. - Continue ASA, statin  Seizure disorder:  - On primidone  Hypokalemia:  - Resolved.  Hypoalbuminemia:  - Dietitian consult  Hypocalcemia: Resolved.  Right foot swelling > left foot: Concern for covid toes on arrival, though extremities documented to be warm with pulses throughout hospitalization. Venous U/S does not show DVT bilaterally. XR only with soft tissue swelling. Suspect due to venous stasis. Exam reassuring on day of discharge.  - Elevate extremities, compression hose.   Generalized weakness:  - PT/OT consulted though pt's functional status does not appear to have changed from baseline.   Discharge Instructions Discharge Instructions    Diet - low sodium heart healthy   Complete by: As directed    Discharge instructions   Complete by: As directed    You are being discharged from the hospital after treatment for covid-19 infection. You are felt to be stable enough to no longer require inpatient monitoring, testing, and treatment, though you will need to follow the recommendations below. - You also received an antibiotic called fosfomycin for UTI, but do not need further treatment after discharge. - Stay in isolation for 21 days from date of positive test.  -  For urinary retention, continue tamsulosin. If you are discharged with  a foley catheter, it is recommended that you follow up with urology (see [facility] contact information below) in 1-2 weeks for voiding trial in the office.  - Do not take NSAID medications (including, but not limited to, ibuprofen, advil, motrin, naproxen, aleve, goody's powder, etc.) - Follow up with your doctor in the next week via telehealth or seek medical attention right away if your symptoms get WORSE.  - Consider donating plasma after you have recovered (either 14 days after a negative test or 28 days after symptoms have completely resolved) because your antibodies to this virus may be helpful to give to others with life-threatening infections. Please go to the website www.oneblood.org if you would like to consider volunteering for plasma donation.    Directions for you at home:  Wear a facemask You should wear a facemask that covers your nose and mouth when you are in the same room with other people and when you visit a healthcare provider. People who live with or visit you should also wear a facemask while they are in the same room with you.  Separate yourself from other people in your home As much as possible, you should stay in a different room from other people in your home. Also, you should use a separate bathroom, if available.  Avoid sharing household items You should not share dishes, drinking glasses, cups, eating utensils, towels, bedding, or other items with other people in your home. After using these items, you should wash them thoroughly with soap and water.  Cover your coughs and sneezes Cover your mouth and nose with a tissue when you cough or sneeze, or you can cough or sneeze into your sleeve. Throw used tissues in a lined trash can, and immediately wash your hands with soap and water for at least 20 seconds or use an alcohol-based hand rub.  Wash your [facility] your hands often and thoroughly with soap and water for at least 20 seconds. You can use an  alcohol-based hand sanitizer if soap and water are not available and if your hands are not visibly dirty. Avoid touching your eyes, nose, and mouth with unwashed hands.  Directions for those who live with, or provide care at home for you:  Limit the number of people who have contact with the patient If possible, have only one caregiver for the patient. Other household members should stay in another home or place of residence. If this is not possible, they should stay in another room, or be separated from the patient as much as possible. Use a separate bathroom, if available. Restrict visitors who do not have an essential need to be in the home.  Ensure good ventilation Make sure that shared spaces in the home have good air flow, such as from an air conditioner or an opened window, weather permitting.  Wash your hands often Wash your hands often and thoroughly with soap and water for at least 20 seconds. You can use an alcohol based hand sanitizer if soap and water are not available and if your hands are not visibly dirty. Avoid touching your eyes, nose, and mouth with unwashed hands. Use disposable paper towels to dry your hands. If not available, use dedicated cloth towels and replace them when they become wet.  Wear a facemask and gloves Wear a disposable facemask at all times in the room and gloves when you touch or have contact with the patient's blood, body  fluids, and/or secretions or excretions, such as sweat, saliva, sputum, nasal mucus, vomit, urine, or feces.  Ensure the mask fits over your nose and mouth tightly, and do not touch it during use. Throw out disposable facemasks and gloves after using them. Do not reuse. Wash your hands immediately after removing your facemask and gloves. If your personal clothing becomes contaminated, carefully remove clothing and launder. Wash your hands after handling contaminated clothing. Place all used disposable facemasks, gloves, and other  waste in a lined container before disposing them with other household waste. Remove gloves and wash your hands immediately after handling these items.  Do not share dishes, glasses, or other household items with the patient Avoid sharing household items. You should not share dishes, drinking glasses, cups, eating utensils, towels, bedding, or other items with a patient who is confirmed to have, or being evaluated for, COVID-19 infection. After the person uses these items, you should wash them thoroughly with soap and water.  Wash laundry thoroughly Immediately remove and wash clothes or bedding that have blood, body fluids, and/or secretions or excretions, such as sweat, saliva, sputum, nasal mucus, vomit, urine, or feces, on them. Wear gloves when handling laundry from the patient. Read and follow directions on labels of laundry or clothing items and detergent. In general, wash and dry with the warmest temperatures recommended on the label.  Clean all areas the individual has used often Clean all touchable surfaces, such as counters, tabletops, doorknobs, bathroom fixtures, toilets, phones, keyboards, tablets, and bedside tables, every day. Also, clean any surfaces that may have blood, body fluids, and/or secretions or excretions on them. Wear gloves when cleaning surfaces the patient has come in contact with. Use a diluted bleach solution (e.g., dilute bleach with 1 part bleach and 10 parts water) or a household disinfectant with a label that says EPA-registered for coronaviruses. To make a bleach solution at home, add 1 tablespoon of bleach to 1 quart (4 cups) of water. For a larger supply, add  cup of bleach to 1 gallon (16 cups) of water. Read labels of cleaning products and follow recommendations provided on product labels. Labels contain instructions for safe and effective use of the cleaning product including precautions you should take when applying the product, such as wearing gloves or  eye protection and making sure you have good ventilation during use of the product. Remove gloves and wash hands immediately after cleaning.  Monitor yourself for signs and symptoms of illness Caregivers and household members are considered close contacts, should monitor their health, and will be asked to limit movement outside of the home to the extent possible. Follow the monitoring steps for close contacts listed on the symptom monitoring form.  If you have additional questions, contact your local health department or call the epidemiologist on call at [phone] (available 24/7). This guidance is subject to change. For the most up-to-date guidance from [facility], please refer to their website: YouBlogs.pl   Increase activity slowly   Complete by: As directed      Allergies as of 08/29/2019      Reactions   Dextromethorphan Other (See Comments)   contraindication   Pseudoephedrine Other (See Comments)   contriindication   Penicillins Rash   Has patient had a PCN reaction causing immediate rash, facial/tongue/throat swelling, SOB or lightheadedness with hypotension: No Has patient had a PCN reaction causing severe rash involving mucus membranes or skin necrosis: No Has patient had a PCN reaction that required hospitalization No Has patient had  a PCN reaction occurring within the last 10 years: No If all of the above answers are "NO", then may proceed with Cephalosporin use.      Medication List    STOP taking these medications   terconazole 0.4 % vaginal cream Commonly known as: Terazol 7     TAKE these medications   aspirin 81 MG chewable tablet Chew 81 mg by mouth daily.   fluticasone 50 MCG/ACT nasal spray Commonly known as: FLONASE Place 1 spray into both nostrils daily.   loratadine 10 MG tablet Commonly known as: CLARITIN Take 10 mg by mouth as needed for allergies.   memantine 10 MG tablet Commonly  known as: NAMENDA Take 1 tablet (10 mg total) by mouth 2 (two) times daily.   pravastatin 40 MG tablet Commonly known as: PRAVACHOL Take 1.5 tablets by mouth at bedtime.   primidone 250 MG tablet Commonly known as: MYSOLINE TAKE 1 TABLET BY MOUTH TWICE DAILY.   psyllium 0.52 g capsule Commonly known as: REGULOID Take 0.52 g by mouth at bedtime.   sennosides-docusate sodium 8.6-50 MG tablet Commonly known as: SENOKOT-S Take 1 tablet by mouth See admin instructions. Take 1 tablet by mouth daily at bedtime on Monday, Wednesday and Friday   tamsulosin 0.4 MG Caps capsule Commonly known as: FLOMAX Take 1 capsule (0.4 mg total) by mouth daily. Start taking on: August 30, 2019      Follow-up Information    Reymundo Poll, MD Follow up.   Specialty: Family Medicine Contact information: [address] [place] [address] [phone]          Allergies  Allergen Reactions  . Dextromethorphan Other (See Comments)    contraindication  . Pseudoephedrine Other (See Comments)    contriindication  . Penicillins Rash    Has patient had a PCN reaction causing immediate rash, facial/tongue/throat swelling, SOB or lightheadedness with hypotension: No Has patient had a PCN reaction causing severe rash involving mucus membranes or skin necrosis: No Has patient had a PCN reaction that required hospitalization No Has patient had a PCN reaction occurring within the last 10 years: No If all of the above answers are "NO", then may proceed with Cephalosporin use.    Consultations:  None  Procedures/Studies: CT ABDOMEN PELVIS W CONTRAST  Result Date: 08/23/2019 CLINICAL DATA:  Generalized abdominal pain, history of COVID-19 positivity EXAM: CT ABDOMEN AND PELVIS WITH CONTRAST TECHNIQUE: Multidetector CT imaging of the abdomen and pelvis was performed using the standard protocol following bolus administration of intravenous contrast. CONTRAST:  132mL OMNIPAQUE IOHEXOL  300 MG/ML  SOLN COMPARISON:  None. FINDINGS: Lower chest: Lung bases demonstrate mild patchy ground-glass opacities consistent with the given clinical history of COVID-19 positivity. Hepatobiliary: Liver is within normal limits. The gallbladder is decompressed. Pancreas: Unremarkable. No pancreatic ductal dilatation or surrounding inflammatory changes. Spleen: Normal in size without focal abnormality. Adrenals/Urinary Tract: Adrenal glands are within normal limits. Kidneys demonstrate a normal enhancement pattern bilaterally. Renal vascular calcifications are seen. No obstructive changes are noted. Bladder is significantly distended. Stomach/Bowel: Colon demonstrates some retained fecal material which may represent some mild constipation. No obstructive changes are seen. The appendix is not well visualized although no inflammatory changes to suggest appendicitis are seen. No obstructive or inflammatory changes of the small bowel are noted. Stomach is decompressed. Vascular/Lymphatic: Aortic atherosclerosis. No enlarged abdominal or pelvic lymph nodes. Reproductive: Status post hysterectomy. No adnexal masses. Other: No abdominal wall hernia or abnormality. No abdominopelvic ascites. Musculoskeletal: Degenerative  changes of lumbar spine are noted. IMPRESSION: Patchy ground-glass opacities in the lung bases bilaterally consistent with the patient's given clinical history. Changes of mild constipation. No other focal abnormality is seen. Electronically Signed   By: Inez Catalina M.D.   On: 08/23/2019 17:53   DG Chest Port 1 View  Result Date: 08/23/2019 CLINICAL DATA:  Fatigue, COVID positive EXAM: PORTABLE CHEST 1 VIEW COMPARISON:  08/21/2019 FINDINGS: The heart size and mediastinal contours are within normal limits. Valve replacement again noted. Both lungs are clear. No pleural effusion. The visualized skeletal structures are unremarkable. IMPRESSION: No acute process in the chest. Electronically Signed   By:  Macy Mis M.D.   On: 08/23/2019 12:49   DG Chest Portable 1 View  Result Date: 08/21/2019 CLINICAL DATA:  Weakness, cough, recent COVID-19 exposure EXAM: PORTABLE CHEST 1 VIEW COMPARISON:  Portable exam 0925 hours compared to 12/19/2016 FINDINGS: Normal heart size post AVR. Mediastinal contours and pulmonary vascularity normal. Lungs clear. No pleural effusion or pneumothorax. Bones demineralized. IMPRESSION: No acute abnormalities. Electronically Signed   By: Lavonia Dana M.D.   On: 08/21/2019 09:55   DG Foot Complete Right  Result Date: 08/23/2019 CLINICAL DATA:  Confusion, toe pain EXAM: RIGHT FOOT COMPLETE - 3+ VIEW COMPARISON:  None FINDINGS: Osseous demineralization. Joint spaces preserved. No acute fracture, dislocation, or bone destruction. Soft tissue swelling at dorsum of foot overlying the distal metatarsals and MTP joints. IMPRESSION: Soft tissue swelling at dorsum of forefoot with diffuse bony demineralization. No acute osseous abnormalities. Electronically Signed   By: Lavonia Dana M.D.   On: 08/23/2019 14:39   VAS [place] LOWER EXTREMITY VENOUS (DVT)  Result Date: 08/29/2019  Lower Venous Study Indications: Elevated Ddimer.  Risk Factors: COVID 19 positive. Limitations: Poor ultrasound/tissue interface and patient positioning, patient immobility. Comparison Study: No prior studies. Performing Technologist: Oliver Hum RVT  Examination Guidelines: A complete evaluation includes B-mode imaging, spectral Doppler, color Doppler, and power Doppler as needed of all accessible portions of each vessel. Bilateral testing is considered an integral part of a complete examination. Limited examinations for reoccurring indications may be performed as noted.  +---------+---------------+---------+-----------+----------+--------------+ RIGHT    CompressibilityPhasicitySpontaneityPropertiesThrombus Aging +---------+---------------+---------+-----------+----------+--------------+ CFV      Full            Yes      Yes                                 +---------+---------------+---------+-----------+----------+--------------+ SFJ      Full                                                        +---------+---------------+---------+-----------+----------+--------------+ FV Prox  Full                                                        +---------+---------------+---------+-----------+----------+--------------+ FV Mid   Full                                                        +---------+---------------+---------+-----------+----------+--------------+  FV DistalFull                                                        +---------+---------------+---------+-----------+----------+--------------+ PFV      Full                                                        +---------+---------------+---------+-----------+----------+--------------+ POP      Full           Yes      Yes                                 +---------+---------------+---------+-----------+----------+--------------+ PTV      Full                                                        +---------+---------------+---------+-----------+----------+--------------+ PERO     Full                                                        +---------+---------------+---------+-----------+----------+--------------+   +---------+---------------+---------+-----------+----------+--------------+ LEFT     CompressibilityPhasicitySpontaneityPropertiesThrombus Aging +---------+---------------+---------+-----------+----------+--------------+ CFV      Full           Yes      Yes                                 +---------+---------------+---------+-----------+----------+--------------+ SFJ      Full                                                        +---------+---------------+---------+-----------+----------+--------------+ FV Prox  Full                                                         +---------+---------------+---------+-----------+----------+--------------+ FV Mid   Full                                                        +---------+---------------+---------+-----------+----------+--------------+ FV DistalFull                                                        +---------+---------------+---------+-----------+----------+--------------+   PFV      Full                                                        +---------+---------------+---------+-----------+----------+--------------+ POP                                                   Not visualized +---------+---------------+---------+-----------+----------+--------------+ PTV      Full                                                        +---------+---------------+---------+-----------+----------+--------------+ PERO     Full                                                        +---------+---------------+---------+-----------+----------+--------------+     Summary: Right: There is no evidence of deep vein thrombosis in the lower extremity. However, portions of this examination were limited- see technologist comments above. No cystic structure found in the popliteal fossa. Left: There is no evidence of deep vein thrombosis in the lower extremity. However, portions of this examination were limited- see technologist comments above. No cystic structure found in the popliteal fossa.  *See table(s) above for measurements and observations. Electronically signed by Harold Barban MD on 08/29/2019 at 8:32:52 AM.    Final     Subjective: Feels well, no chest pain or abdominal pain, denies shortness of breath. No agitation noted.   Discharge Exam: Vitals:   08/29/19 0702 08/29/19 0707  BP:  119/71  Pulse: 72 61  Resp:  18  Temp:  97.9 F (36.6 C)  SpO2: 99% 98%   General: Pt is alert, awake, not in acute distress Cardiovascular: RRR, S1/S2 +, no rubs, no gallops Respiratory: CTA  bilaterally, no wheezing, no rhonchi Abdominal: Soft, NT, ND, bowel sounds + Extremities: Right > left pedal edema is stable. DP pulses 2+, cap refill brisk in toes, warm without ulceration or erythema.   Labs: Basic Metabolic Panel: Recent Labs  Lab 08/23/19 1552 08/23/19 1600 08/23/19 2105 08/24/19 0500 08/25/19 0321 08/27/19 0211 08/28/19 0240  NA 144  --   --  141 140 143 144  K 3.1*  --   --  3.7 3.4* 3.3* 4.3  CL 118*  --   --  103 104 106 107  CO2 17*  --   --  26 19* 30 28  GLUCOSE 68*  --   --  103* 89 113* 88  BUN 17  --   --  14 16 22 22   CREATININE 0.40* 0.30*  --  0.61 0.64 0.57 0.54  CALCIUM 6.1*  --   --  8.3* 8.2* 8.2* 8.2*  MG  --   --  1.9 1.8 1.7 2.2 2.0  PHOS  --   --  2.9  --   --   --   --  Liver Function Tests: Recent Labs  Lab 08/23/19 1552 08/24/19 0500 08/25/19 0321 08/27/19 0211 08/28/19 0240  AST 31 31 34 33 37  ALT 13 19 17 19 20   ALKPHOS 84 106 104 99 94  BILITOT 0.8 0.3 1.1 0.6 0.4  PROT 4.9* 6.3* 5.9* 6.6 6.1*  ALBUMIN 2.4* 3.0* 3.0* 3.0* 2.9*   Recent Labs  Lab 08/23/19 1552  LIPASE 20   No results for input(s): AMMONIA in the last 168 hours. CBC: Recent Labs  Lab 08/23/19 1552 08/24/19 0500 08/25/19 0321 08/27/19 0211 08/28/19 0240  WBC 2.7* 3.5* 4.7 4.2 4.4  NEUTROABS  --  2.6 3.2 2.5 2.8  HGB 10.5* 12.4 11.2* 12.1 11.4*  HCT 33.1* 38.1 33.8* 37.9 35.9*  MCV 99.1 96.2 94.2 95.9 96.5  PLT 123* 154 163 187 194   Anemia work up Recent Labs    08/27/19 0211 08/28/19 0240  FERRITIN 227 206   Urinalysis    Component Value Date/Time   COLORURINE YELLOW 08/23/2019 [place] 08/23/2019 2246   LABSPEC 1.036 (H) 08/23/2019 2246   PHURINE 5.0 08/23/2019 2246   GLUCOSEU NEGATIVE 08/23/2019 2246   GLUCOSEU NEGATIVE 11/06/2016 1022   HGBUR NEGATIVE 08/23/2019 2246   BILIRUBINUR NEGATIVE 08/23/2019 2246   BILIRUBINUR N 07/04/2019 1119   KETONESUR 5 (A) 08/23/2019 2246   PROTEINUR NEGATIVE 08/23/2019  2246   UROBILINOGEN 0.2 07/04/2019 1119   UROBILINOGEN 0.2 05/08/2018 1732   NITRITE NEGATIVE 08/23/2019 2246   LEUKOCYTESUR TRACE (A) 08/23/2019 2246    Microbiology Recent Results (from the past 240 hour(s))  SARS CORONAVIRUS 2 (TAT 6-24 HRS) Nasopharyngeal Nasopharyngeal Swab     Status: Abnormal   Collection Time: 08/21/19  9:10 AM   Specimen: Nasopharyngeal Swab  Result Value Ref Range Status   SARS Coronavirus 2 POSITIVE (A) NEGATIVE Final    Comment: EMAILED LORI BERDIK [id number] 08/21/2019 [place] K (NOTE) SARS-CoV-2 target nucleic acids are DETECTED. The SARS-CoV-2 RNA is generally detectable in upper and lower respiratory specimens during the acute phase of infection. Positive results are indicative of the presence of SARS-CoV-2 RNA. Clinical correlation with patient history and other diagnostic information is  necessary to determine patient infection status. Positive results do not rule out bacterial infection or co-infection with other viruses.  The expected result is Negative. Fact Sheet for Patients: SugarRoll.be Fact Sheet for Healthcare Providers: https://www.woods-mathews.com/ This test is not yet approved or cleared by the [place] FDA and  has been authorized for detection and/or diagnosis of SARS-CoV-2 by FDA under an Emergency Use Authorization (EUA). This EUA will remain  in effect (meaning this test can be used) for the duration of the COVID-19 declaration  under Section 564(b)(1) of the Act, 21 U.S.C. section 360bbb-3(b)(1), unless the authorization is terminated or revoked sooner. Performed at [facility], [place] [address] [place], [place] [address]   Culture, blood (Routine X 2) w Reflex to ID Panel     Status: None (Preliminary result)   Collection Time: 08/24/19  1:00 AM   Specimen: BLOOD  Result Value Ref Range Status   Specimen Description BLOOD RIGHT ANTECUBITAL  Final   Special Requests   Final     BOTTLES DRAWN AEROBIC AND ANAEROBIC Blood Culture adequate volume   Culture   Final    NO GROWTH 4 DAYS Performed at [facility], [place] [address] [place], [place] [address]    Report Status PENDING  Incomplete  Culture, blood (Routine X  2) w Reflex to ID Panel     Status: None (Preliminary result)   Collection Time: 08/24/19  5:05 AM   Specimen: BLOOD LEFT HAND  Result Value Ref Range Status   Specimen Description BLOOD LEFT HAND  Final   Special Requests   Final    BOTTLES DRAWN AEROBIC ONLY Blood Culture results may not be optimal due to an inadequate volume of blood received in culture bottles   Culture   Final    NO GROWTH 4 DAYS Performed at [facility], [place] [address] [place], [place] [address]    Report Status PENDING  Incomplete    Time coordinating discharge: Approximately 40 minutes  Patrecia Pour, MD  [facility] 08/29/2019, 9:51 AM

## 2019-08-30 ENCOUNTER — Emergency Department (HOSPITAL_COMMUNITY)
Admission: EM | Admit: 2019-08-30 | Discharge: 2019-08-30 | Disposition: A | Discharge status: Other Acute Inpatient Hospital | Payer: PPO

## 2019-08-30 NOTE — Progress Notes (Signed)
Patient seen, interviewed, and examined this AM. She has no complaints and there are no changes to her exam from the time of discharge yesterday. She remains stable for discharge with foley catheter and plans for urology follow up having completed inpatient treatment for covid-19 without residual pulmonary symptoms. Please see discharge summary from 12/15 for full details/plan.   Vance Gather, MD 08/30/2019 8:59 AM

## 2019-08-30 NOTE — NC FL2 (Signed)
[place] LEVEL OF CARE SCREENING TOOL     IDENTIFICATION  Patient Name: Nicole Hess Birthdate: [birth year] Sex: female Admission Date (Current Location): 08/23/2019  [facility] and [place] Number:  Herbalist and Address:  [facility], [place] [address] [place], [place] [address]([address]      Provider Number: [id number]  Attending Physician Name and Address:  Patrecia Pour, MD  Relative Name and Phone Number:  Friend: Alleen Borne [phone]    Current Level of Care: Hospital Recommended Level of Care: Memory Care Prior Approval Number:    Date Approved/Denied:   PASRR Number:    Discharge Plan: Other (Comment)(Carriage [name])    Current Diagnoses: Patient Active Problem List   Diagnosis Date Noted  . Hypocalcemia 08/23/2019  . COVID-19 virus infection 08/23/2019  . Hypokalemia 08/23/2019  . Overactive bladder 05/20/2018  . Seasonal allergic rhinitis due to pollen 05/20/2018  . Vertigo 04/29/2017  . Memory difficulty 04/29/2017  . Dementia without behavioral disturbance ([place]) 02/06/2017  . 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 History of colonic polyps 11/09/2016  . S/P AVR (aortic valve replacement) 04/02/2015  . Aortic stenosis due to bicuspid aortic valve 02/26/2015  . Severe aortic stenosis 05/02/2014  . Precordial pain 01/26/2013  . Depression 03/30/2012  . Hyperlipidemia 03/30/2012    Orientation RESPIRATION BLADDER Height & Weight     Self  Normal Indwelling catheter Weight:   Height:  5\' 6"  (167.6 cm)  BEHAVIORAL SYMPTOMS/MOOD NEUROLOGICAL BOWEL NUTRITION STATUS      Continent Diet  AMBULATORY STATUS COMMUNICATION OF NEEDS Skin   Limited Assist Verbally Normal                       Personal Care Assistance Level of Assistance  Total care       Total Care Assistance: Maximum assistance    Functional Limitations Info             SPECIAL CARE FACTORS FREQUENCY  PT (By licensed PT), OT (By licensed OT)     PT Frequency: min 5x/week OT Frequency: min3x/week            Contractures Contractures Info: Not present    Additional Factors Info  Code Status Code Status Info: DNR             Current Medications (08/30/2019):  This is the current hospital active medication list Current Facility-Administered Medications  Medication Dose Route Frequency Provider Last Rate Last Admin  . acetaminophen (TYLENOL) tablet 650 mg  650 mg Oral Q6H PRN Toy Baker, MD   650 mg at 08/25/19 2129  . aspirin chewable tablet 81 mg  81 mg Oral Daily Doutova, Anastassia, MD   81 mg at 08/29/19 0915  . Chlorhexidine Gluconate Cloth 2 % PADS 6 each  6 each Topical Daily Vance Gather B, MD      . enoxaparin (LOVENOX) injection 40 mg  40 mg Subcutaneous Q24H Doutova, Anastassia, MD   40 mg at 08/29/19 0916  . feeding supplement (ENSURE ENLIVE) (ENSURE ENLIVE) liquid 237 mL  237 mL Oral BID BM Patrecia Pour, MD   237 mL at 08/29/19 1438  . HYDROcodone-acetaminophen (NORCO/VICODIN) 5-325 MG per tablet 1-2 tablet  1-2 tablet Oral Q4H PRN Toy Baker, MD   1 tablet at 08/27/19 2212  . memantine (NAMENDA) tablet 10 mg  10  mg Oral BID Toy Baker, MD   10 mg at 08/29/19 2116  . multivitamin with minerals tablet 1 tablet  1 tablet Oral Daily Patrecia Pour, MD   1 tablet at 08/29/19 0915  . ondansetron (ZOFRAN) tablet 4 mg  4 mg Oral Q6H PRN Doutova, Anastassia, MD       Or  . ondansetron (ZOFRAN) injection 4 mg  4 mg Intravenous Q6H PRN Doutova, Anastassia, MD      . polycarbophil (FIBERCON) tablet 625 mg  625 mg Oral QHS Toy Baker, MD   625 mg at 08/29/19 2116  . pravastatin (PRAVACHOL) tablet 60 mg  60 mg Oral QHS Toy Baker, MD   60 mg at 08/29/19 2116  . primidone (MYSOLINE) tablet 250 mg  250 mg Oral BID Toy Baker, MD   250 mg at 08/29/19  2117  . sodium chloride flush (NS) 0.9 % injection 3 mL  3 mL Intravenous Q12H Toy Baker, MD   3 mL at 08/29/19 2117  . tamsulosin (FLOMAX) capsule 0.4 mg  0.4 mg Oral Daily Patrecia Pour, MD   0.4 mg at 08/29/19 0915     Discharge Medications: Please see discharge summary for a list of discharge medications.  Relevant Imaging Results:  Relevant Lab Results:   Additional Information SSN: [id number]  Ninfa Meeker, RN

## 2019-08-30 NOTE — TOC Transition Note (Signed)
Transition of Care [facility]) - CM/SW Discharge Note   Patient Details  Name: Nicole Hess MRN: [id number] Date of Birth: 22-Aug-1946  Transition of Care [facility]) CM/SW Contact:  Ninfa Meeker, RN Phone Number: [phone] (working remotely) 08/30/2019, 9:57 AM   Clinical Narrative:    Patient will DC to: [place] date: 08/30/19 Family notified: POA : Alleen Borne Transport by: Corey Harold: 11:30am   Patient is medically ready to be discharged back to [facility]. Case manager confirmed with Helene Kelp RN that bed is available. Home Health nursing has been arranged at request of Helene Kelp to monitor foley cath. Referral called to [facility]. Case manager has notified charge nurse and patient's POA of planned discharge. Discharge Summary, FL2, Face to Face and Home Health orders have been faxed to [facility]. Bedside RN to call report to [phone], patient going to room D6. Ambulance transport has been requested. Medical necessity form printed to nursing unit. Patient's POA -Alleen Borne, has requested that she be face timed with patient prior to her discharge,she has not had opportunity  during patient's stay at [facility]. Case manager contacted charge nurse with this request.      Final next level of care: Memory Care Barriers to Discharge: No Barriers Identified   Patient Goals and CMS Choice     Choice offered to / list presented to : [facility] POA / Guardian(Marcena Linus Orn)  Discharge Placement                       Discharge Plan and Services   Discharge Planning Services: CM Consult Post Acute Care Choice: Home Health          DME Arranged: N/A         HH Arranged: RN [place]: [facility] (now [facility]) Date [place]: 08/30/19 Time [place]: [phone] Representative spoke with at [place]: [place] ([place]) Interventions     Readmission Risk Interventions No  flowsheet data found.

## 2019-08-30 NOTE — Progress Notes (Signed)
Called Marciana POA to [place] and get report

## 2019-08-30 NOTE — Progress Notes (Signed)
Gave report to Elenore Paddy, Therapist, sports at [facility] for bed D6

## 2019-09-04 DIAGNOSIS — N3946 Mixed incontinence: Secondary | ICD-10-CM | POA: Diagnosis not present

## 2019-09-04 DIAGNOSIS — N952 Postmenopausal atrophic vaginitis: Secondary | ICD-10-CM | POA: Diagnosis not present

## 2019-09-04 DIAGNOSIS — M6281 Muscle weakness (generalized): Secondary | ICD-10-CM | POA: Diagnosis not present

## 2019-09-04 DIAGNOSIS — Z8619 Personal history of other infectious and parasitic diseases: Secondary | ICD-10-CM | POA: Diagnosis not present

## 2019-09-04 DIAGNOSIS — R259 Unspecified abnormal involuntary movements: Secondary | ICD-10-CM | POA: Diagnosis not present

## 2019-09-06 DIAGNOSIS — U071 COVID-19: Secondary | ICD-10-CM | POA: Diagnosis not present

## 2019-09-06 DIAGNOSIS — F0280 Dementia in other diseases classified elsewhere without behavioral disturbance: Secondary | ICD-10-CM | POA: Diagnosis not present

## 2019-09-06 DIAGNOSIS — J1289 Other viral pneumonia: Secondary | ICD-10-CM | POA: Diagnosis not present

## 2019-09-06 DIAGNOSIS — E785 Hyperlipidemia, unspecified: Secondary | ICD-10-CM | POA: Diagnosis not present

## 2019-09-06 DIAGNOSIS — G40909 Epilepsy, unspecified, not intractable, without status epilepticus: Secondary | ICD-10-CM | POA: Diagnosis not present

## 2019-09-06 DIAGNOSIS — F329 Major depressive disorder, single episode, unspecified: Secondary | ICD-10-CM | POA: Diagnosis not present

## 2019-09-06 DIAGNOSIS — Z9181 History of falling: Secondary | ICD-10-CM | POA: Diagnosis not present

## 2019-09-06 DIAGNOSIS — Z952 Presence of prosthetic heart valve: Secondary | ICD-10-CM | POA: Diagnosis not present

## 2019-09-06 DIAGNOSIS — N3281 Overactive bladder: Secondary | ICD-10-CM | POA: Diagnosis not present

## 2019-09-06 DIAGNOSIS — Z7982 Long term (current) use of aspirin: Secondary | ICD-10-CM | POA: Diagnosis not present

## 2019-09-06 NOTE — Telephone Encounter (Signed)
error 

## 2019-09-11 DIAGNOSIS — F0391 Unspecified dementia with behavioral disturbance: Secondary | ICD-10-CM | POA: Diagnosis not present

## 2019-09-11 DIAGNOSIS — Z8669 Personal history of other diseases of the nervous system and sense organs: Secondary | ICD-10-CM | POA: Diagnosis not present

## 2019-09-11 DIAGNOSIS — Z79899 Other long term (current) drug therapy: Secondary | ICD-10-CM | POA: Diagnosis not present

## 2019-09-11 DIAGNOSIS — G301 Alzheimer's disease with late onset: Secondary | ICD-10-CM | POA: Diagnosis not present

## 2019-09-11 DIAGNOSIS — R259 Unspecified abnormal involuntary movements: Secondary | ICD-10-CM | POA: Diagnosis not present

## 2019-09-26 DIAGNOSIS — M6281 Muscle weakness (generalized): Secondary | ICD-10-CM | POA: Diagnosis not present

## 2019-09-26 DIAGNOSIS — B351 Tinea unguium: Secondary | ICD-10-CM | POA: Diagnosis not present

## 2019-09-26 DIAGNOSIS — I739 Peripheral vascular disease, unspecified: Secondary | ICD-10-CM | POA: Diagnosis not present

## 2019-10-04 DIAGNOSIS — R945 Abnormal results of liver function studies: Secondary | ICD-10-CM | POA: Diagnosis not present

## 2019-10-04 DIAGNOSIS — Z79899 Other long term (current) drug therapy: Secondary | ICD-10-CM | POA: Diagnosis not present

## 2019-10-16 DIAGNOSIS — E785 Hyperlipidemia, unspecified: Secondary | ICD-10-CM | POA: Diagnosis not present

## 2019-10-16 DIAGNOSIS — N952 Postmenopausal atrophic vaginitis: Secondary | ICD-10-CM | POA: Diagnosis not present

## 2019-10-16 DIAGNOSIS — F329 Major depressive disorder, single episode, unspecified: Secondary | ICD-10-CM | POA: Diagnosis not present

## 2019-10-16 DIAGNOSIS — G3183 Dementia with Lewy bodies: Secondary | ICD-10-CM | POA: Diagnosis not present

## 2019-10-16 DIAGNOSIS — N3946 Mixed incontinence: Secondary | ICD-10-CM | POA: Diagnosis not present

## 2019-10-16 DIAGNOSIS — N343 Urethral syndrome, unspecified: Secondary | ICD-10-CM | POA: Diagnosis not present

## 2019-10-16 DIAGNOSIS — N761 Subacute and chronic vaginitis: Secondary | ICD-10-CM | POA: Diagnosis not present

## 2019-10-16 DIAGNOSIS — N390 Urinary tract infection, site not specified: Secondary | ICD-10-CM | POA: Diagnosis not present

## 2019-10-16 DIAGNOSIS — G40909 Epilepsy, unspecified, not intractable, without status epilepticus: Secondary | ICD-10-CM | POA: Diagnosis not present

## 2019-10-16 DIAGNOSIS — F0280 Dementia in other diseases classified elsewhere without behavioral disturbance: Secondary | ICD-10-CM | POA: Diagnosis not present

## 2019-10-16 DIAGNOSIS — N3281 Overactive bladder: Secondary | ICD-10-CM | POA: Diagnosis not present

## 2019-10-16 DIAGNOSIS — Z952 Presence of prosthetic heart valve: Secondary | ICD-10-CM | POA: Diagnosis not present

## 2019-10-16 DIAGNOSIS — J1289 Other viral pneumonia: Secondary | ICD-10-CM | POA: Diagnosis not present

## 2019-10-16 DIAGNOSIS — U071 COVID-19: Secondary | ICD-10-CM | POA: Diagnosis not present

## 2019-10-16 DIAGNOSIS — Z7982 Long term (current) use of aspirin: Secondary | ICD-10-CM | POA: Diagnosis not present

## 2019-10-16 DIAGNOSIS — Z9181 History of falling: Secondary | ICD-10-CM | POA: Diagnosis not present

## 2019-10-17 DIAGNOSIS — N390 Urinary tract infection, site not specified: Secondary | ICD-10-CM | POA: Diagnosis not present

## 2019-10-17 DIAGNOSIS — Z79899 Other long term (current) drug therapy: Secondary | ICD-10-CM | POA: Diagnosis not present

## 2019-11-08 DIAGNOSIS — Z79899 Other long term (current) drug therapy: Secondary | ICD-10-CM | POA: Diagnosis not present

## 2019-11-24 DIAGNOSIS — R600 Localized edema: Secondary | ICD-10-CM | POA: Diagnosis not present

## 2019-11-24 DIAGNOSIS — M25571 Pain in right ankle and joints of right foot: Secondary | ICD-10-CM | POA: Diagnosis not present

## 2019-11-25 ENCOUNTER — Inpatient Hospital Stay (HOSPITAL_COMMUNITY): Payer: PPO

## 2019-11-25 ENCOUNTER — Inpatient Hospital Stay (HOSPITAL_COMMUNITY): Payer: PPO | Admitting: Anesthesiology

## 2019-11-25 ENCOUNTER — Encounter (HOSPITAL_COMMUNITY): Payer: Self-pay | Admitting: Emergency Medicine

## 2019-11-25 ENCOUNTER — Emergency Department (HOSPITAL_COMMUNITY): Payer: PPO

## 2019-11-25 ENCOUNTER — Inpatient Hospital Stay (HOSPITAL_COMMUNITY)
Admission: EM | Admit: 2019-11-25 | Discharge: 2019-11-29 | DRG: 481 | Disposition: A | Discharge status: Skilled Nursing Facility | Payer: PPO | Source: Skilled Nursing Facility | Attending: Internal Medicine | Admitting: Internal Medicine

## 2019-11-25 ENCOUNTER — Other Ambulatory Visit: Payer: Self-pay

## 2019-11-25 ENCOUNTER — Encounter (HOSPITAL_COMMUNITY)
Admission: EM | Disposition: A | Discharge status: Skilled Nursing Facility | Payer: Self-pay | Source: Skilled Nursing Facility | Attending: Family Medicine

## 2019-11-25 DIAGNOSIS — Z880 Allergy status to penicillin: Secondary | ICD-10-CM | POA: Diagnosis not present

## 2019-11-25 DIAGNOSIS — Z807 Family history of other malignant neoplasms of lymphoid, hematopoietic and related tissues: Secondary | ICD-10-CM | POA: Diagnosis not present

## 2019-11-25 DIAGNOSIS — S99911A Unspecified injury of right ankle, initial encounter: Secondary | ICD-10-CM | POA: Diagnosis not present

## 2019-11-25 DIAGNOSIS — E785 Hyperlipidemia, unspecified: Secondary | ICD-10-CM | POA: Diagnosis not present

## 2019-11-25 DIAGNOSIS — M6281 Muscle weakness (generalized): Secondary | ICD-10-CM | POA: Diagnosis not present

## 2019-11-25 DIAGNOSIS — S72001A Fracture of unspecified part of neck of right femur, initial encounter for closed fracture: Secondary | ICD-10-CM

## 2019-11-25 DIAGNOSIS — S0101XA Laceration without foreign body of scalp, initial encounter: Secondary | ICD-10-CM

## 2019-11-25 DIAGNOSIS — D62 Acute posthemorrhagic anemia: Secondary | ICD-10-CM | POA: Diagnosis not present

## 2019-11-25 DIAGNOSIS — R9431 Abnormal electrocardiogram [ECG] [EKG]: Secondary | ICD-10-CM | POA: Diagnosis not present

## 2019-11-25 DIAGNOSIS — D638 Anemia in other chronic diseases classified elsewhere: Secondary | ICD-10-CM | POA: Diagnosis not present

## 2019-11-25 DIAGNOSIS — Z79899 Other long term (current) drug therapy: Secondary | ICD-10-CM

## 2019-11-25 DIAGNOSIS — I2510 Atherosclerotic heart disease of native coronary artery without angina pectoris: Secondary | ICD-10-CM | POA: Diagnosis present

## 2019-11-25 DIAGNOSIS — F0390 Unspecified dementia without behavioral disturbance: Secondary | ICD-10-CM | POA: Diagnosis not present

## 2019-11-25 DIAGNOSIS — S93401A Sprain of unspecified ligament of right ankle, initial encounter: Secondary | ICD-10-CM

## 2019-11-25 DIAGNOSIS — T148XXA Other injury of unspecified body region, initial encounter: Secondary | ICD-10-CM

## 2019-11-25 DIAGNOSIS — W19XXXA Unspecified fall, initial encounter: Secondary | ICD-10-CM | POA: Diagnosis not present

## 2019-11-25 DIAGNOSIS — R531 Weakness: Secondary | ICD-10-CM | POA: Diagnosis not present

## 2019-11-25 DIAGNOSIS — R52 Pain, unspecified: Secondary | ICD-10-CM | POA: Diagnosis not present

## 2019-11-25 DIAGNOSIS — F329 Major depressive disorder, single episode, unspecified: Secondary | ICD-10-CM | POA: Diagnosis present

## 2019-11-25 DIAGNOSIS — M8580 Other specified disorders of bone density and structure, unspecified site: Secondary | ICD-10-CM | POA: Diagnosis present

## 2019-11-25 DIAGNOSIS — Y92122 Bedroom in nursing home as the place of occurrence of the external cause: Secondary | ICD-10-CM

## 2019-11-25 DIAGNOSIS — Z823 Family history of stroke: Secondary | ICD-10-CM | POA: Diagnosis not present

## 2019-11-25 DIAGNOSIS — S72141A Displaced intertrochanteric fracture of right femur, initial encounter for closed fracture: Secondary | ICD-10-CM | POA: Diagnosis not present

## 2019-11-25 DIAGNOSIS — R0602 Shortness of breath: Secondary | ICD-10-CM | POA: Diagnosis not present

## 2019-11-25 DIAGNOSIS — R1311 Dysphagia, oral phase: Secondary | ICD-10-CM | POA: Diagnosis not present

## 2019-11-25 DIAGNOSIS — R404 Transient alteration of awareness: Secondary | ICD-10-CM | POA: Diagnosis not present

## 2019-11-25 DIAGNOSIS — M25571 Pain in right ankle and joints of right foot: Secondary | ICD-10-CM | POA: Diagnosis not present

## 2019-11-25 DIAGNOSIS — Z888 Allergy status to other drugs, medicaments and biological substances status: Secondary | ICD-10-CM | POA: Diagnosis not present

## 2019-11-25 DIAGNOSIS — R011 Cardiac murmur, unspecified: Secondary | ICD-10-CM | POA: Diagnosis not present

## 2019-11-25 DIAGNOSIS — G250 Essential tremor: Secondary | ICD-10-CM | POA: Diagnosis not present

## 2019-11-25 DIAGNOSIS — U071 COVID-19: Secondary | ICD-10-CM | POA: Diagnosis not present

## 2019-11-25 DIAGNOSIS — Z952 Presence of prosthetic heart valve: Secondary | ICD-10-CM | POA: Diagnosis not present

## 2019-11-25 DIAGNOSIS — Z954 Presence of other heart-valve replacement: Secondary | ICD-10-CM | POA: Diagnosis not present

## 2019-11-25 DIAGNOSIS — Z841 Family history of disorders of kidney and ureter: Secondary | ICD-10-CM | POA: Diagnosis not present

## 2019-11-25 DIAGNOSIS — Z8249 Family history of ischemic heart disease and other diseases of the circulatory system: Secondary | ICD-10-CM | POA: Diagnosis not present

## 2019-11-25 DIAGNOSIS — R279 Unspecified lack of coordination: Secondary | ICD-10-CM | POA: Diagnosis not present

## 2019-11-25 DIAGNOSIS — Z7982 Long term (current) use of aspirin: Secondary | ICD-10-CM | POA: Diagnosis not present

## 2019-11-25 DIAGNOSIS — S72009A Fracture of unspecified part of neck of unspecified femur, initial encounter for closed fracture: Secondary | ICD-10-CM | POA: Diagnosis not present

## 2019-11-25 DIAGNOSIS — Z743 Need for continuous supervision: Secondary | ICD-10-CM | POA: Diagnosis not present

## 2019-11-25 DIAGNOSIS — Z66 Do not resuscitate: Secondary | ICD-10-CM | POA: Diagnosis not present

## 2019-11-25 DIAGNOSIS — Z9181 History of falling: Secondary | ICD-10-CM | POA: Diagnosis not present

## 2019-11-25 DIAGNOSIS — M109 Gout, unspecified: Secondary | ICD-10-CM | POA: Diagnosis not present

## 2019-11-25 DIAGNOSIS — W010XXA Fall on same level from slipping, tripping and stumbling without subsequent striking against object, initial encounter: Secondary | ICD-10-CM | POA: Diagnosis present

## 2019-11-25 DIAGNOSIS — I4891 Unspecified atrial fibrillation: Secondary | ICD-10-CM | POA: Diagnosis not present

## 2019-11-25 DIAGNOSIS — I350 Nonrheumatic aortic (valve) stenosis: Secondary | ICD-10-CM | POA: Diagnosis not present

## 2019-11-25 DIAGNOSIS — R569 Unspecified convulsions: Secondary | ICD-10-CM | POA: Diagnosis not present

## 2019-11-25 DIAGNOSIS — Z8616 Personal history of COVID-19: Secondary | ICD-10-CM

## 2019-11-25 DIAGNOSIS — S199XXA Unspecified injury of neck, initial encounter: Secondary | ICD-10-CM | POA: Diagnosis not present

## 2019-11-25 DIAGNOSIS — S72141D Displaced intertrochanteric fracture of right femur, subsequent encounter for closed fracture with routine healing: Secondary | ICD-10-CM | POA: Diagnosis not present

## 2019-11-25 DIAGNOSIS — R41841 Cognitive communication deficit: Secondary | ICD-10-CM | POA: Diagnosis not present

## 2019-11-25 DIAGNOSIS — N182 Chronic kidney disease, stage 2 (mild): Secondary | ICD-10-CM | POA: Diagnosis not present

## 2019-11-25 DIAGNOSIS — I959 Hypotension, unspecified: Secondary | ICD-10-CM | POA: Diagnosis not present

## 2019-11-25 DIAGNOSIS — D631 Anemia in chronic kidney disease: Secondary | ICD-10-CM | POA: Diagnosis not present

## 2019-11-25 DIAGNOSIS — R600 Localized edema: Secondary | ICD-10-CM | POA: Diagnosis present

## 2019-11-25 DIAGNOSIS — S0990XA Unspecified injury of head, initial encounter: Secondary | ICD-10-CM | POA: Diagnosis not present

## 2019-11-25 DIAGNOSIS — R42 Dizziness and giddiness: Secondary | ICD-10-CM | POA: Diagnosis not present

## 2019-11-25 HISTORY — PX: INTRAMEDULLARY (IM) NAIL INTERTROCHANTERIC: SHX5875

## 2019-11-25 LAB — CBC WITH DIFFERENTIAL/PLATELET
Abs Immature Granulocytes: 0.01 10*3/uL (ref 0.00–0.07)
Basophils Absolute: 0 10*3/uL (ref 0.0–0.1)
Basophils Relative: 0 %
Eosinophils Absolute: 0 10*3/uL (ref 0.0–0.5)
Eosinophils Relative: 0 %
HCT: 33 % — ABNORMAL LOW (ref 36.0–46.0)
Hemoglobin: 10.3 g/dL — ABNORMAL LOW (ref 12.0–15.0)
Immature Granulocytes: 0 %
Lymphocytes Relative: 9 %
Lymphs Abs: 0.5 10*3/uL — ABNORMAL LOW (ref 0.7–4.0)
MCH: 30.7 pg (ref 26.0–34.0)
MCHC: 31.2 g/dL (ref 30.0–36.0)
MCV: 98.5 fL (ref 80.0–100.0)
Monocytes Absolute: 0.5 10*3/uL (ref 0.1–1.0)
Monocytes Relative: 10 %
Neutro Abs: 4.2 10*3/uL (ref 1.7–7.7)
Neutrophils Relative %: 81 %
Platelets: 161 10*3/uL (ref 150–400)
RBC: 3.35 MIL/uL — ABNORMAL LOW (ref 3.87–5.11)
RDW: 12.7 % (ref 11.5–15.5)
WBC: 5.2 10*3/uL (ref 4.0–10.5)
nRBC: 0 % (ref 0.0–0.2)

## 2019-11-25 LAB — BASIC METABOLIC PANEL
Anion gap: 13 (ref 5–15)
BUN: 35 mg/dL — ABNORMAL HIGH (ref 8–23)
CO2: 24 mmol/L (ref 22–32)
Calcium: 8.4 mg/dL — ABNORMAL LOW (ref 8.9–10.3)
Chloride: 101 mmol/L (ref 98–111)
Creatinine, Ser: 1.09 mg/dL — ABNORMAL HIGH (ref 0.44–1.00)
GFR calc Af Amer: 59 mL/min — ABNORMAL LOW (ref >60)
GFR calc non Af Amer: 51 mL/min — ABNORMAL LOW (ref >60)
Glucose, Bld: 135 mg/dL — ABNORMAL HIGH (ref 70–99)
Potassium: 4 mmol/L (ref 3.5–5.1)
Sodium: 138 mmol/L (ref 135–145)

## 2019-11-25 LAB — IRON AND TIBC
Iron: 13 ug/dL — ABNORMAL LOW (ref 28–170)
Saturation Ratios: 5 % — ABNORMAL LOW (ref 10.4–31.8)
TIBC: 245 ug/dL — ABNORMAL LOW (ref 250–450)
UIBC: 232 ug/dL

## 2019-11-25 LAB — RETICULOCYTES
Immature Retic Fract: 7.8 % (ref 2.3–15.9)
RBC.: 2.6 MIL/uL — ABNORMAL LOW (ref 3.87–5.11)
Retic Count, Absolute: 23.1 10*3/uL (ref 19.0–186.0)
Retic Ct Pct: 0.9 % (ref 0.4–3.1)

## 2019-11-25 LAB — FERRITIN: Ferritin: 63 ng/mL (ref 11–307)

## 2019-11-25 LAB — RESPIRATORY PANEL BY RT PCR (FLU A&B, COVID)
Influenza A by PCR: NEGATIVE
Influenza B by PCR: NEGATIVE
SARS Coronavirus 2 by RT PCR: NEGATIVE

## 2019-11-25 LAB — PROTIME-INR
INR: 1 (ref 0.8–1.2)
Prothrombin Time: 13.5 s (ref 11.4–15.2)

## 2019-11-25 LAB — VITAMIN D 25 HYDROXY (VIT D DEFICIENCY, FRACTURES): Vit D, 25-Hydroxy: 15.55 ng/mL — ABNORMAL LOW (ref 30–100)

## 2019-11-25 LAB — BRAIN NATRIURETIC PEPTIDE: B Natriuretic Peptide: 117.3 pg/mL — ABNORMAL HIGH (ref 0.0–100.0)

## 2019-11-25 SURGERY — FIXATION, FRACTURE, INTERTROCHANTERIC, WITH INTRAMEDULLARY ROD
Anesthesia: General | Site: Hip | Laterality: Right

## 2019-11-25 MED ORDER — PHENYLEPHRINE HCL-NACL 10-0.9 MG/250ML-% IV SOLN
INTRAVENOUS | Status: DC | PRN
  Administered 2019-11-25: 20 ug/min via INTRAVENOUS

## 2019-11-25 MED ORDER — ACETAMINOPHEN 325 MG PO TABS
325.0000 mg | ORAL_TABLET | Freq: Four times a day (QID) | ORAL | Status: DC | PRN
  Administered 2019-11-26: 325 mg via ORAL
  Administered 2019-11-28: 650 mg via ORAL
  Filled 2019-11-25 (×2): qty 2

## 2019-11-25 MED ORDER — ONDANSETRON HCL 4 MG/2ML IJ SOLN: 4.0000 mg | Freq: Once | INTRAMUSCULAR | Status: DC | PRN

## 2019-11-25 MED ORDER — METHOCARBAMOL 1000 MG/10ML IJ SOLN
500.0000 mg | Freq: Four times a day (QID) | INTRAVENOUS | Status: DC | PRN
  Filled 2019-11-25: qty 5

## 2019-11-25 MED ORDER — MORPHINE SULFATE (PF) 2 MG/ML IV SOLN: 1.0000 mg | INTRAVENOUS | Status: DC | PRN

## 2019-11-25 MED ORDER — MORPHINE SULFATE (PF) 2 MG/ML IV SOLN: 0.5000 mg | INTRAVENOUS | Status: DC | PRN

## 2019-11-25 MED ORDER — PRAVASTATIN SODIUM 40 MG PO TABS: 60.0000 mg | ORAL_TABLET | Freq: Every day | ORAL | Status: DC

## 2019-11-25 MED ORDER — TAMSULOSIN HCL 0.4 MG PO CAPS: 0.4000 mg | ORAL_CAPSULE | Freq: Every day | ORAL | Status: DC

## 2019-11-25 MED ORDER — ALUM & MAG HYDROXIDE-SIMETH 200-200-20 MG/5ML PO SUSP: 30.0000 mL | ORAL | Status: DC | PRN

## 2019-11-25 MED ORDER — PROPOFOL 10 MG/ML IV BOLUS
INTRAVENOUS | Status: AC
  Filled 2019-11-25: qty 20

## 2019-11-25 MED ORDER — PRIMIDONE 250 MG PO TABS
250.0000 mg | ORAL_TABLET | Freq: Two times a day (BID) | ORAL | Status: DC
  Administered 2019-11-26 – 2019-11-29 (×6): 250 mg via ORAL
  Filled 2019-11-25 (×11): qty 1

## 2019-11-25 MED ORDER — MEMANTINE HCL 10 MG PO TABS
10.0000 mg | ORAL_TABLET | Freq: Two times a day (BID) | ORAL | Status: DC
  Administered 2019-11-25 – 2019-11-29 (×7): 10 mg via ORAL
  Filled 2019-11-25 (×11): qty 1

## 2019-11-25 MED ORDER — MAGNESIUM CITRATE PO SOLN
1.0000 | Freq: Once | ORAL | Status: AC | PRN
  Administered 2019-11-29: 1 via ORAL
  Filled 2019-11-25: qty 296

## 2019-11-25 MED ORDER — FENTANYL CITRATE (PF) 100 MCG/2ML IJ SOLN
INTRAMUSCULAR | Status: DC | PRN
  Administered 2019-11-25: 50 ug via INTRAVENOUS

## 2019-11-25 MED ORDER — FENTANYL CITRATE (PF) 100 MCG/2ML IJ SOLN: 25.0000 ug | INTRAMUSCULAR | Status: DC | PRN

## 2019-11-25 MED ORDER — TRANEXAMIC ACID-NACL 1000-0.7 MG/100ML-% IV SOLN
INTRAVENOUS | Status: AC
  Filled 2019-11-25: qty 100

## 2019-11-25 MED ORDER — ONDANSETRON HCL 4 MG/2ML IJ SOLN: 4.0000 mg | Freq: Four times a day (QID) | INTRAMUSCULAR | Status: DC | PRN

## 2019-11-25 MED ORDER — ACETAMINOPHEN 10 MG/ML IV SOLN
INTRAVENOUS | Status: DC | PRN
  Administered 2019-11-25: 1000 mg via INTRAVENOUS

## 2019-11-25 MED ORDER — TRANEXAMIC ACID-NACL 1000-0.7 MG/100ML-% IV SOLN
1000.0000 mg | INTRAVENOUS | Status: AC
  Administered 2019-11-25: 1000 mg via INTRAVENOUS

## 2019-11-25 MED ORDER — LACTATED RINGERS IV SOLN: INTRAVENOUS | Status: DC

## 2019-11-25 MED ORDER — LACTATED RINGERS IV BOLUS
500.0000 mL | Freq: Once | INTRAVENOUS | Status: AC
  Administered 2019-11-25: 500 mL via INTRAVENOUS

## 2019-11-25 MED ORDER — CEFAZOLIN SODIUM-DEXTROSE 2-4 GM/100ML-% IV SOLN
INTRAVENOUS | Status: AC
  Filled 2019-11-25: qty 100

## 2019-11-25 MED ORDER — ESTRADIOL 0.1 MG/GM VA CREA
1.0000 | TOPICAL_CREAM | VAGINAL | Status: DC
  Administered 2019-11-27: 1 via VAGINAL
  Filled 2019-11-25: qty 42.5

## 2019-11-25 MED ORDER — METHOCARBAMOL 500 MG PO TABS: 500.0000 mg | ORAL_TABLET | Freq: Four times a day (QID) | ORAL | Status: DC | PRN

## 2019-11-25 MED ORDER — ACETAMINOPHEN 500 MG PO TABS
500.0000 mg | ORAL_TABLET | Freq: Four times a day (QID) | ORAL | Status: AC
  Administered 2019-11-25 – 2019-11-26 (×4): 500 mg via ORAL
  Filled 2019-11-25 (×3): qty 1

## 2019-11-25 MED ORDER — FLUTICASONE PROPIONATE 50 MCG/ACT NA SUSP
1.0000 | Freq: Every day | NASAL | Status: DC | PRN
  Filled 2019-11-25: qty 16

## 2019-11-25 MED ORDER — ENOXAPARIN SODIUM 40 MG/0.4ML ~~LOC~~ SOLN: 40.0000 mg | SUBCUTANEOUS | Status: DC

## 2019-11-25 MED ORDER — SODIUM CHLORIDE 0.9 % IV SOLN: INTRAVENOUS | Status: DC

## 2019-11-25 MED ORDER — SORBITOL 70 % SOLN: 30.0000 mL | Freq: Every day | Status: DC | PRN

## 2019-11-25 MED ORDER — CALCIUM CARBONATE-VITAMIN D 500-200 MG-UNIT PO TABS
1.0000 | ORAL_TABLET | Freq: Three times a day (TID) | ORAL | Status: DC
  Administered 2019-11-26 – 2019-11-29 (×9): 1 via ORAL
  Filled 2019-11-25 (×10): qty 1

## 2019-11-25 MED ORDER — ASPIRIN 81 MG PO CHEW
81.0000 mg | CHEWABLE_TABLET | Freq: Every day | ORAL | Status: DC
  Administered 2019-11-26 – 2019-11-29 (×4): 81 mg via ORAL
  Filled 2019-11-25 (×4): qty 1

## 2019-11-25 MED ORDER — FUROSEMIDE 20 MG PO TABS
20.0000 mg | ORAL_TABLET | Freq: Every day | ORAL | Status: DC
  Administered 2019-11-26 – 2019-11-29 (×4): 20 mg via ORAL
  Filled 2019-11-25 (×4): qty 1

## 2019-11-25 MED ORDER — CEFAZOLIN SODIUM-DEXTROSE 2-4 GM/100ML-% IV SOLN
2.0000 g | Freq: Four times a day (QID) | INTRAVENOUS | Status: AC
  Administered 2019-11-25 – 2019-11-26 (×3): 2 g via INTRAVENOUS
  Filled 2019-11-25 (×3): qty 100

## 2019-11-25 MED ORDER — CEFAZOLIN SODIUM-DEXTROSE 2-4 GM/100ML-% IV SOLN
2.0000 g | INTRAVENOUS | Status: AC
  Administered 2019-11-25: 2 g via INTRAVENOUS

## 2019-11-25 MED ORDER — HYDROCODONE-ACETAMINOPHEN 5-325 MG PO TABS: 1.0000 | ORAL_TABLET | Freq: Four times a day (QID) | ORAL | Status: DC | PRN

## 2019-11-25 MED ORDER — ALBUMIN HUMAN 5 % IV SOLN: INTRAVENOUS | Status: DC | PRN

## 2019-11-25 MED ORDER — FENTANYL CITRATE (PF) 250 MCG/5ML IJ SOLN
INTRAMUSCULAR | Status: AC
  Filled 2019-11-25: qty 5

## 2019-11-25 MED ORDER — MUPIROCIN 2 % EX OINT
TOPICAL_OINTMENT | CUTANEOUS | Status: AC
  Filled 2019-11-25: qty 22

## 2019-11-25 MED ORDER — ENOXAPARIN SODIUM 40 MG/0.4ML ~~LOC~~ SOLN
40.0000 mg | SUBCUTANEOUS | Status: DC
  Administered 2019-11-26 – 2019-11-29 (×4): 40 mg via SUBCUTANEOUS
  Filled 2019-11-25 (×4): qty 0.4

## 2019-11-25 MED ORDER — HYDROCODONE-ACETAMINOPHEN 7.5-325 MG PO TABS: 1.0000 | ORAL_TABLET | ORAL | Status: DC | PRN

## 2019-11-25 MED ORDER — SODIUM CHLORIDE 0.9 % IR SOLN
Status: DC | PRN
  Administered 2019-11-25: 1000 mL

## 2019-11-25 MED ORDER — LORATADINE 10 MG PO TABS: 10.0000 mg | ORAL_TABLET | ORAL | Status: DC | PRN

## 2019-11-25 MED ORDER — HYDROCODONE-ACETAMINOPHEN 5-325 MG PO TABS: 1.0000 | ORAL_TABLET | ORAL | Status: DC | PRN

## 2019-11-25 MED ORDER — DOCUSATE SODIUM 100 MG PO CAPS
100.0000 mg | ORAL_CAPSULE | Freq: Two times a day (BID) | ORAL | Status: DC
  Administered 2019-11-25 – 2019-11-29 (×7): 100 mg via ORAL
  Filled 2019-11-25 (×8): qty 1

## 2019-11-25 MED ORDER — PROPOFOL 10 MG/ML IV BOLUS
INTRAVENOUS | Status: DC | PRN
  Administered 2019-11-25: 20 mg via INTRAVENOUS
  Administered 2019-11-25: 40 mg via INTRAVENOUS

## 2019-11-25 MED ORDER — PHENOL 1.4 % MT LIQD: 1.0000 | OROMUCOSAL | Status: DC | PRN

## 2019-11-25 MED ORDER — TRANEXAMIC ACID 1000 MG/10ML IV SOLN
2000.0000 mg | INTRAVENOUS | Status: DC
  Filled 2019-11-25: qty 20

## 2019-11-25 MED ORDER — POVIDONE-IODINE 10 % EX SWAB: 2.0000 "application " | Freq: Once | CUTANEOUS | Status: DC

## 2019-11-25 MED ORDER — PROPOFOL 500 MG/50ML IV EMUL
INTRAVENOUS | Status: DC | PRN
  Administered 2019-11-25: 25 ug/kg/min via INTRAVENOUS

## 2019-11-25 MED ORDER — POLYETHYLENE GLYCOL 3350 17 G PO PACK: 17.0000 g | PACK | Freq: Every day | ORAL | Status: DC | PRN

## 2019-11-25 MED ORDER — MENTHOL 3 MG MT LOZG: 1.0000 | LOZENGE | OROMUCOSAL | Status: DC | PRN

## 2019-11-25 MED ORDER — ONDANSETRON HCL 4 MG PO TABS: 4.0000 mg | ORAL_TABLET | Freq: Four times a day (QID) | ORAL | Status: DC | PRN

## 2019-11-25 MED ORDER — SENNOSIDES-DOCUSATE SODIUM 8.6-50 MG PO TABS
1.0000 | ORAL_TABLET | ORAL | Status: DC
  Administered 2019-11-27: 1 via ORAL
  Filled 2019-11-25: qty 1

## 2019-11-25 SURGICAL SUPPLY — 47 items
BIT DRILL SHORT 4.0 (BIT) IMPLANT
BNDG COHESIVE 4X5 TAN STRL (GAUZE/BANDAGES/DRESSINGS) ×3 IMPLANT
BNDG COHESIVE 6X5 TAN STRL LF (GAUZE/BANDAGES/DRESSINGS) IMPLANT
BNDG GAUZE ELAST 4 BULKY (GAUZE/BANDAGES/DRESSINGS) ×3 IMPLANT
COVER MAYO STAND STRL (DRAPES) ×2 IMPLANT
COVER PERINEAL POST (MISCELLANEOUS) ×3 IMPLANT
COVER SURGICAL LIGHT HANDLE (MISCELLANEOUS) ×3 IMPLANT
COVER WAND RF STERILE (DRAPES) ×3 IMPLANT
DRAPE C-ARMOR (DRAPES) ×3 IMPLANT
DRAPE STERI IOBAN 125X83 (DRAPES) ×3 IMPLANT
DRILL BIT SHORT 4.0 (BIT) ×3
DRSG MEPILEX BORDER 4X4 (GAUZE/BANDAGES/DRESSINGS) ×7 IMPLANT
DRSG MEPILEX BORDER 4X8 (GAUZE/BANDAGES/DRESSINGS) ×3 IMPLANT
DRSG PAD ABDOMINAL 8X10 ST (GAUZE/BANDAGES/DRESSINGS) ×6 IMPLANT
DURAPREP 26ML APPLICATOR (WOUND CARE) ×3 IMPLANT
ELECT REM PT RETURN 9FT ADLT (ELECTROSURGICAL) ×3
ELECTRODE REM PT RTRN 9FT ADLT (ELECTROSURGICAL) ×1 IMPLANT
GLOVE BIOGEL PI IND STRL 7.0 (GLOVE) ×1 IMPLANT
GLOVE BIOGEL PI INDICATOR 7.0 (GLOVE) ×2
GLOVE ECLIPSE 7.0 STRL STRAW (GLOVE) ×5 IMPLANT
GLOVE INDICATOR 7.0 STRL GRN (GLOVE) ×2 IMPLANT
GLOVE SKINSENSE NS SZ7.5 (GLOVE) ×4
GLOVE SKINSENSE STRL SZ7.5 (GLOVE) ×2 IMPLANT
GOWN STRL REIN XL XLG (GOWN DISPOSABLE) ×3 IMPLANT
GUIDE PIN 3.2X343 (PIN) ×2
GUIDE PIN 3.2X343MM (PIN) ×6
KIT BASIN OR (CUSTOM PROCEDURE TRAY) ×3 IMPLANT
KIT TURNOVER KIT B (KITS) ×3 IMPLANT
MANIFOLD NEPTUNE II (INSTRUMENTS) ×3 IMPLANT
NAIL TRIGEN INTERTAN RT 10X40 (Nail) ×2 IMPLANT
NS IRRIG 1000ML POUR BTL (IV SOLUTION) ×3 IMPLANT
PACK GENERAL/GYN (CUSTOM PROCEDURE TRAY) ×3 IMPLANT
PACK UNIVERSAL I (CUSTOM PROCEDURE TRAY) ×2 IMPLANT
PAD ARMBOARD 7.5X6 YLW CONV (MISCELLANEOUS) ×6 IMPLANT
PAD CAST 4YDX4 CTTN HI CHSV (CAST SUPPLIES) ×2 IMPLANT
PADDING CAST COTTON 4X4 STRL (CAST SUPPLIES) ×6
PIN GUIDE 3.2X343MM (PIN) IMPLANT
SCREW LAG COMPR KIT 95/90 (Screw) ×2 IMPLANT
SCREW TRIGEN LOW PROF 5.0X42.5 (Screw) ×2 IMPLANT
STAPLER VISISTAT 35W (STAPLE) ×3 IMPLANT
SUT VIC AB 0 CT1 27 (SUTURE) ×3
SUT VIC AB 0 CT1 27XBRD ANBCTR (SUTURE) ×1 IMPLANT
SUT VIC AB 2-0 CT1 27 (SUTURE) ×3
SUT VIC AB 2-0 CT1 TAPERPNT 27 (SUTURE) ×1 IMPLANT
TOWEL GREEN STERILE (TOWEL DISPOSABLE) ×3 IMPLANT
TOWEL GREEN STERILE FF (TOWEL DISPOSABLE) ×3 IMPLANT
WATER STERILE IRR 1000ML POUR (IV SOLUTION) ×3 IMPLANT

## 2019-11-25 NOTE — Anesthesia Postprocedure Evaluation (Signed)
Anesthesia Post Note  Patient: Nicole Hess  Procedure(s) Performed: INTRAMEDULLARY (IM) NAIL INTERTROCHANTRIC (Right Hip)     Patient location during evaluation: PACU Anesthesia Type: General Level of consciousness: oriented and awake and alert Pain management: pain level controlled Vital Signs Assessment: post-procedure vital signs reviewed and stable Respiratory status: spontaneous breathing, respiratory function stable and patient connected to nasal cannula oxygen Cardiovascular status: blood pressure returned to baseline and stable Postop Assessment: no headache, no backache and no apparent nausea or vomiting Anesthetic complications: no    Last Vitals:  Vitals:   11/25/19 1726 11/25/19 1743  BP: 106/60 107/64  Pulse: 62 63  Resp: 10 16  Temp: 36.6 C 36.4 C  SpO2: 100% 100%    Last Pain:  Vitals:   11/25/19 1743  TempSrc: Oral  PainSc:                  [name]

## 2019-11-25 NOTE — ED Notes (Signed)
Pt transported to CT ?

## 2019-11-25 NOTE — Anesthesia Preprocedure Evaluation (Signed)
Anesthesia Evaluation  Patient identified by MRN, date of birth, ID band Patient awake    Reviewed: Allergy & Precautions, NPO status , Patient's Chart, lab work & pertinent test results  Airway Mallampati: II  TM Distance: >3 FB Neck ROM: Full    Dental  (+) Teeth Intact, Dental Advisory Given   Pulmonary    breath sounds clear to auscultation       Cardiovascular  Rhythm:Regular Rate:Normal     Neuro/Psych    GI/Hepatic   Endo/Other    Renal/GU      Musculoskeletal   Abdominal   Peds  Hematology   Anesthesia Other Findings   Reproductive/Obstetrics                             Anesthesia Physical Anesthesia Plan  ASA: III  Anesthesia Plan: General   Post-op Pain Management:    Induction:   PONV Risk Score and Plan: Ondansetron and Dexamethasone  Airway Management Planned: Oral ETT  Additional Equipment:   Intra-op Plan:   Post-operative Plan: Extubation in OR  Informed Consent: I have reviewed the patients History and Physical, chart, labs and discussed the procedure including the risks, benefits and alternatives for the proposed anesthesia with the patient or authorized representative who has indicated his/her understanding and acceptance.     Dental advisory given  Plan Discussed with: CRNA and Anesthesiologist  Anesthesia Plan Comments: (R. Intertrochanteric hip fracture Dementia follows commands S/P AVR  2016 with bioprosthetic valve, normal coronaries by cath 2016 H/O seizures well controlled)        Anesthesia Quick Evaluation

## 2019-11-25 NOTE — Plan of Care (Signed)

## 2019-11-25 NOTE — ED Triage Notes (Signed)
Pt arrived to triage from [facility].  Fell yesterday and had negative x-ray of R ankle.  Found on floor again this morning.  Approx 1 inch laceration to L side of head.  R hip deformity and pain with movement.  Pitting edema to lower extremities.  Swelling under R eye that staff reported was normal for her.  C-collar in place by EMS.

## 2019-11-25 NOTE — Progress Notes (Signed)
I have spoken to Janetta Hora (EDP) and Faustino [facility]) about patient's right hip fx.  Per Simona Huh, last possible po intake was 8 am but most likely did not have anything to eat since last night.  I would like to perform surgical repair this afternoon on patient pending medical clearance by medicine team.  Simona Huh has agreed to allow [place] to proceed with surgical repair of right hip fracture.  Please hold all anticoagulation for impending surgery.  Please call me directly with any questions at the number listed below.  Azucena Cecil, MD [facility] [phone] 1:24 PM

## 2019-11-25 NOTE — Consult Note (Signed)
ORTHOPAEDIC CONSULTATION  REQUESTING PHYSICIAN: Milton Ferguson, MD  Chief Complaint: Right intertroch fx  HPI: Nicole Hess is a 74 y.o. female who presents with right intertroch s/p fall at [facility].  Level 5 caveat for dementia.  HPI obtained from [place], Nicole Hess.  Patient is ambulatory at baseline without any devices.  Not on any anticoagulation.  S/p aortic valve replacement in 2016 by Dr. Cyndia Bent.  Has not had any issues since.  Ortho consulted for right hip fx.  Past Medical History:  Diagnosis Date  . Abnormal Pap smear   . Anemia   . Anxiety   . Aortic stenosis   . Bicuspid aortic valve   . Coronary artery disease   . Depression   . Gout   . Heart murmur   . Heart valve problem    will have follow up Echo-Dr Jordan-will have replacement in a couple of years  . Leukopenia    Seen by Dr. Lamonte Sakai, present since 2008, watchful waiting  . Memory difficulty 04/29/2017  . Osteopenia   . Seizures ([place])    as a child  . Shortness of breath dyspnea   . Vertigo 04/29/2017  . Vertigo    Past Surgical History:  Procedure Laterality Date  . AORTIC VALVE REPLACEMENT N/A 02/26/2015   Procedure: AORTIC VALVE REPLACEMENT (AVR);  Surgeon: Gaye Pollack, MD;  Location: [facility];  Service: Open Heart Surgery;  Laterality: N/A;  . CARDIAC CATHETERIZATION N/A 01/29/2015   Procedure: Right/Left Heart Cath and Coronary Angiography;  Surgeon: Peter M Martinique, MD;  Location: [facility];  Service: Cardiovascular;  Laterality: N/A;  . CERVICAL CONE BIOPSY    . COLONOSCOPY  2010  . DOPPLER ECHOCARDIOGRAPHY  3/14  . GUM SURGERY    . PARTIAL HYSTERECTOMY    . TEE WITHOUT CARDIOVERSION N/A 02/26/2015   Procedure: TRANSESOPHAGEAL ECHOCARDIOGRAM (TEE);  Surgeon: Gaye Pollack, MD;  Location: [facility];  Service: Open Heart Surgery;  Laterality: N/A;  . TUBAL LIGATION    . VAGINAL HYSTERECTOMY     for dysplasia   Social History   Socioeconomic History  . Marital status: Single   Spouse name: Not on file  . Number of children: Not on file  . Years of education: 33  . Highest education level: Not on file  Occupational History  . Occupation: Radio broadcast assistant- Retired  Tobacco Use  . Smoking status: Never Smoker  . Smokeless tobacco: Never Used  Substance and Sexual Activity  . Alcohol use: Not Currently  . Drug use: No  . Sexual activity: Not on file    Comment: [facility]  Other Topics Concern  . Not on file  Social History Narrative   Lives   Caffeine BLT:JQZESP every morning   Social Determinants of Health   Financial Resource Strain:   . Difficulty of Paying Living Expenses:   Food Insecurity:   . Worried About Charity fundraiser in the Last Year:   . Arboriculturist in the Last Year:   Transportation Needs:   . Film/video editor (Medical):   Marland Kitchen Lack of Transportation (Non-Medical):   Physical Activity:   . Days of Exercise per Week:   . Minutes of Exercise per Session:   Stress:   . Feeling of Stress :   Social Connections:   . Frequency of Communication with Friends and Family:   . Frequency of Social Gatherings with Friends and Family:   . Attends Religious Services:   .  Active Member of Clubs or Organizations:   . Attends Archivist Meetings:   Marland Kitchen Marital Status:    Family History  Problem Relation Age of Onset  . Multiple myeloma Mother   . Stroke Father   . Hypertension Father   . Heart disease Father 11  . Kidney disease Father   . Cancer Father   . Cancer Maternal Grandmother        renal  . Osteoarthritis Brother    - negative except otherwise stated in the family history section Allergies  Allergen Reactions  . Dextromethorphan Other (See Comments)    contraindication  . Pseudoephedrine Other (See Comments)    contriindication  . Penicillins Rash    Has patient had a PCN reaction causing immediate rash, facial/tongue/throat swelling, SOB or lightheadedness with hypotension: No Has patient had a PCN reaction causing  severe rash involving mucus membranes or skin necrosis: No Has patient had a PCN reaction that required hospitalization No Has patient had a PCN reaction occurring within the last 10 years: No If all of the above answers are "NO", then may proceed with Cephalosporin use.   Prior to Admission medications   Medication Sig Start Date End Date Taking? Authorizing Provider  aspirin 81 MG chewable tablet Chew 81 mg by mouth daily.    [provider]  fluticasone (FLONASE) 50 MCG/ACT nasal spray Place 1 spray into both nostrils daily. 08/21/19   Virgel Manifold, MD  loratadine (CLARITIN) 10 MG tablet Take 10 mg by mouth as needed for allergies.    [provider]  memantine (NAMENDA) 10 MG tablet Take 1 tablet (10 mg total) by mouth 2 (two) times daily. 06/02/18   Kathrynn Ducking, MD  pravastatin (PRAVACHOL) 40 MG tablet Take 1.5 tablets by mouth at bedtime.  09/26/18   [provider]  primidone (MYSOLINE) 250 MG tablet TAKE 1 TABLET BY MOUTH TWICE DAILY. Patient taking differently: Take 250 mg by mouth 2 (two) times daily.  05/17/18   Kathrynn Ducking, MD  psyllium (REGULOID) 0.52 g capsule Take 0.52 g by mouth at bedtime.    [provider]  sennosides-docusate sodium (SENOKOT-S) 8.6-50 MG tablet Take 1 tablet by mouth See admin instructions. Take 1 tablet by mouth daily at bedtime on Monday, Wednesday and Friday    [provider]  tamsulosin (FLOMAX) 0.4 MG CAPS capsule Take 1 capsule (0.4 mg total) by mouth daily. 08/30/19   Patrecia Pour, MD   DG Ankle Complete Right  Result Date: 11/25/2019 CLINICAL DATA:  Fall EXAM: RIGHT ANKLE - COMPLETE 3+ VIEW COMPARISON:  None. FINDINGS: Osteopenia. There is no evidence of fracture, dislocation, or joint effusion. There is no evidence of arthropathy or other focal bone abnormality. Diffuse soft tissue edema. IMPRESSION: 1.  No fracture or dislocation of the right ankle. 2.  Osteopenia. 3.  Diffuse soft tissue edema.  Electronically Signed   By: Eddie Candle M.D.   On: 11/25/2019 12:29   CT HEAD WO CONTRAST  Result Date: 11/25/2019 CLINICAL DATA:  Fall, head trauma EXAM: CT HEAD WITHOUT CONTRAST CT CERVICAL SPINE WITHOUT CONTRAST TECHNIQUE: Multidetector CT imaging of the head and cervical spine was performed following the standard protocol without intravenous contrast. Multiplanar CT image reconstructions of the cervical spine were also generated. COMPARISON:  07/25/2019 FINDINGS: CT HEAD FINDINGS Brain: No evidence of acute infarction, hemorrhage, hydrocephalus, extra-axial collection or mass lesion/mass effect. Mild subcortical white matter and periventricular small vessel ischemic changes. Vascular: Mild  intracranial atherosclerosis. Skull: Normal. Negative for fracture or focal lesion. Sinuses/Orbits: Mild mucosal thickening in the bilateral maxillary sinuses. Minimal partial opacification of the right ethmoid air cells. Visualized paranasal sinuses and mastoid air cells are otherwise clear. Other: None. CT CERVICAL SPINE FINDINGS Alignment: Normal cervical lordosis. Skull base and vertebrae: No acute fracture. No primary bone lesion or focal pathologic process. Soft tissues and spinal canal: No prevertebral fluid or swelling. No visible canal hematoma. Disc levels: Mild degenerative changes of the mid/lower cervical spine. Spinal canal is patent. Upper chest: Visualized lung apices are clear. Other: Visualized thyroid is unremarkable. IMPRESSION: No evidence of acute intracranial abnormality. Mild small vessel ischemic changes. No evidence of traumatic injury to the cervical spine. Mild degenerative changes. Electronically Signed   By: Julian Hy M.D.   On: 11/25/2019 12:13   CT CERVICAL SPINE WO CONTRAST  Result Date: 11/25/2019 CLINICAL DATA:  Fall, head trauma EXAM: CT HEAD WITHOUT CONTRAST CT CERVICAL SPINE WITHOUT CONTRAST TECHNIQUE: Multidetector CT imaging of the head and cervical spine was performed  following the standard protocol without intravenous contrast. Multiplanar CT image reconstructions of the cervical spine were also generated. COMPARISON:  07/25/2019 FINDINGS: CT HEAD FINDINGS Brain: No evidence of acute infarction, hemorrhage, hydrocephalus, extra-axial collection or mass lesion/mass effect. Mild subcortical white matter and periventricular small vessel ischemic changes. Vascular: Mild intracranial atherosclerosis. Skull: Normal. Negative for fracture or focal lesion. Sinuses/Orbits: Mild mucosal thickening in the bilateral maxillary sinuses. Minimal partial opacification of the right ethmoid air cells. Visualized paranasal sinuses and mastoid air cells are otherwise clear. Other: None. CT CERVICAL SPINE FINDINGS Alignment: Normal cervical lordosis. Skull base and vertebrae: No acute fracture. No primary bone lesion or focal pathologic process. Soft tissues and spinal canal: No prevertebral fluid or swelling. No visible canal hematoma. Disc levels: Mild degenerative changes of the mid/lower cervical spine. Spinal canal is patent. Upper chest: Visualized lung apices are clear. Other: Visualized thyroid is unremarkable. IMPRESSION: No evidence of acute intracranial abnormality. Mild small vessel ischemic changes. No evidence of traumatic injury to the cervical spine. Mild degenerative changes. Electronically Signed   By: Julian Hy M.D.   On: 11/25/2019 12:13   DG Hip Unilat With Pelvis 2-3 Views Right  Result Date: 11/25/2019 CLINICAL DATA:  Fall, pain and deformity EXAM: DG HIP (WITH OR WITHOUT PELVIS) 2-3V RIGHT COMPARISON:  None. FINDINGS: There is a comminuted, displaced intratrochanteric fracture of the right hip with displaced fragments of the greater and lesser trochanters. No other fracture or dislocation of the pelvis or proximal left femur as included. Osteopenia. Large burden of stool and stool balls in the colon and rectum. IMPRESSION: Comminuted, displaced intratrochanteric  fracture of the right hip. Electronically Signed   By: Eddie Candle M.D.   On: 11/25/2019 12:27   - pertinent xrays, CT, MRI studies were reviewed and independently interpreted  Positive ROS: All other systems have been reviewed and were otherwise negative with the exception of those mentioned in the HPI and as above.  Physical Exam: General: no acute distress Cardiovascular: No pedal edema Respiratory: No cyanosis, no use of accessory musculature GI: No organomegaly, abdomen is soft and non-tender Skin: No lesions in the area of chief complaint Neurologic: Sensation intact distally Psychiatric: patient has dementia Lymphatic: No axillary or cervical lymphadenopathy  MUSCULOSKELETAL:  - obvious deformity of RLE - palpable distal pulses - pain with movement of RLE  Assessment: Right intertrochanteric fx  Plan: - I have obtained informed consent from [place], [place],  after full discussion of r/b/a to surgical repair - will plan for surgery this afternoon pending medical clearance/optimization - hold anticoagulation - continue NPO - last po intake was 5 pm last night; had some sips of water with morning meds  Thank you for the consult and the opportunity to see Ms. Audrie Lia, MD [facility] 1:28 PM

## 2019-11-25 NOTE — Anesthesia Procedure Notes (Addendum)
Spinal  Patient location during procedure: OR Start time: 11/25/2019 3:20 PM End time: 11/25/2019 3:25 PM Staffing Performed: anesthesiologist  Anesthesiologist: Roberts Gaudy, MD Preanesthetic Checklist Completed: patient identified, IV checked, site marked, risks and benefits discussed, surgical consent, monitors and equipment checked, pre-op evaluation and timeout performed Spinal Block Patient position: right lateral decubitus Prep: DuraPrep Patient monitoring: heart rate, cardiac monitor, continuous pulse ox and blood pressure Approach: right paramedian Location: L3-4 Injection technique: single-shot Needle Needle type: Sprotte and Tuohy  Needle gauge: 22 G Needle length: 9 cm Assessment Sensory level: T4 Additional Notes 1.8 mg 0.75% Marcaine injected easily

## 2019-11-25 NOTE — Transfer of Care (Signed)
Immediate Anesthesia Transfer of Care Note  Patient: Nicole Hess  Procedure(s) Performed: INTRAMEDULLARY (IM) NAIL INTERTROCHANTRIC (Right Hip)  Patient Location: PACU  Anesthesia Type:Spinal  Level of Consciousness: awake and patient cooperative  Airway & Oxygen Therapy: Patient Spontanous Breathing  Post-op Assessment: Report given to RN and Post -op Vital signs reviewed and stable  Post vital signs: Reviewed and stable  Last Vitals:  Vitals Value Taken Time  BP    Temp 36.5 C 11/25/19 1641  Pulse 66 11/25/19 1644  Resp 8 11/25/19 1644  SpO2 100 % 11/25/19 1644  Vitals shown include unvalidated device data.  Last Pain:  Vitals:   11/25/19 1020  TempSrc:   PainSc: 3          Complications: No apparent anesthesia complications

## 2019-11-25 NOTE — H&P (Signed)
History and Physical    Nicole Hess DQQ:[id number] DOB: 14-Jul-1946 DOA: 11/25/2019  PCP: Reymundo Poll, MD   Patient coming from: [facility]  I have personally briefly reviewed patient's old medical records in [place]  Chief Complaint: I fell  HPI: Nicole Hess is a 74 y.o. female with medical history significant of Aortic stenosis s/p biosynthetic valve replacement in 2016, CAD, Depression, gout, Leukopenia, Dementia, who is a long term NH resident presented with episode of fall. Pt woke up this morning, while trying to get out of bed, she lost her balance and fell toward her right side and landed on her right side, hit her hip and ankle and forehead on the right side. No LOC, no prodromes of blurry vision or light headedness, no chest pain or SOB. Her baseline activity tolerance is good, able to walk without help for more than 15-20 min, never had SOB or chest pain. No weakness or numbness of any of her limbs. She had COVID PNA in Dec 2020 and recovered. ED Course: X-rays showed Comminuted, displaced intratrochanteric fracture of the right hip. Ortho plan for emergency repair.  Review of Systems: As per HPI otherwise 10 point review of systems negative.    Past Medical History:  Diagnosis Date  . Abnormal Pap smear   . Anemia   . Anxiety   . Aortic stenosis   . Bicuspid aortic valve   . Coronary artery disease   . Depression   . Gout   . Heart murmur   . Heart valve problem    will have follow up Echo-Dr Jordan-will have replacement in a couple of years  . Leukopenia    Seen by Dr. Lamonte Sakai, present since 2008, watchful waiting  . Memory difficulty 04/29/2017  . Osteopenia   . Seizures ([place])    as a child  . Shortness of breath dyspnea   . Vertigo 04/29/2017  . Vertigo     Past Surgical History:  Procedure Laterality Date  . AORTIC VALVE REPLACEMENT N/A 02/26/2015   Procedure: AORTIC VALVE REPLACEMENT (AVR);  Surgeon: Gaye Pollack, MD;   Location: [facility];  Service: Open Heart Surgery;  Laterality: N/A;  . CARDIAC CATHETERIZATION N/A 01/29/2015   Procedure: Right/Left Heart Cath and Coronary Angiography;  Surgeon: Peter M Martinique, MD;  Location: [facility];  Service: Cardiovascular;  Laterality: N/A;  . CERVICAL CONE BIOPSY    . COLONOSCOPY  2010  . DOPPLER ECHOCARDIOGRAPHY  3/14  . GUM SURGERY    . PARTIAL HYSTERECTOMY    . TEE WITHOUT CARDIOVERSION N/A 02/26/2015   Procedure: TRANSESOPHAGEAL ECHOCARDIOGRAM (TEE);  Surgeon: Gaye Pollack, MD;  Location: [facility];  Service: Open Heart Surgery;  Laterality: N/A;  . TUBAL LIGATION    . VAGINAL HYSTERECTOMY     for dysplasia     reports that she has never smoked. She has never used smokeless tobacco. She reports previous alcohol use. She reports that she does not use drugs.  Allergies  Allergen Reactions  . Dextromethorphan Other (See Comments)    contraindication  . Pseudoephedrine Other (See Comments)    contriindication  . Penicillins Rash    Has patient had a PCN reaction causing immediate rash, facial/tongue/throat swelling, SOB or lightheadedness with hypotension: No Has patient had a PCN reaction causing severe rash involving mucus membranes or skin necrosis: No Has patient had a PCN reaction that required hospitalization No Has patient had a PCN reaction occurring within  the last 10 years: No If all of the above answers are "NO", then may proceed with Cephalosporin use.    Family History  Problem Relation Age of Onset  . Multiple myeloma Mother   . Stroke Father   . Hypertension Father   . Heart disease Father 2  . Kidney disease Father   . Cancer Father   . Cancer Maternal Grandmother        renal  . Osteoarthritis Brother      Prior to Admission medications   Medication Sig Start Date End Date Taking? Authorizing Provider  aspirin 81 MG chewable tablet Chew 81 mg by mouth daily.    [provider]  fluticasone (FLONASE) 50 MCG/ACT  nasal spray Place 1 spray into both nostrils daily. 08/21/19   Virgel Manifold, MD  loratadine (CLARITIN) 10 MG tablet Take 10 mg by mouth as needed for allergies.    [provider]  memantine (NAMENDA) 10 MG tablet Take 1 tablet (10 mg total) by mouth 2 (two) times daily. 06/02/18   Kathrynn Ducking, MD  pravastatin (PRAVACHOL) 40 MG tablet Take 1.5 tablets by mouth at bedtime.  09/26/18   [provider]  primidone (MYSOLINE) 250 MG tablet TAKE 1 TABLET BY MOUTH TWICE DAILY. Patient taking differently: Take 250 mg by mouth 2 (two) times daily.  05/17/18   Kathrynn Ducking, MD  psyllium (REGULOID) 0.52 g capsule Take 0.52 g by mouth at bedtime.    [provider]  sennosides-docusate sodium (SENOKOT-S) 8.6-50 MG tablet Take 1 tablet by mouth See admin instructions. Take 1 tablet by mouth daily at bedtime on Monday, Wednesday and Friday    [provider]  tamsulosin (FLOMAX) 0.4 MG CAPS capsule Take 1 capsule (0.4 mg total) by mouth daily. 08/30/19   Patrecia Pour, MD    Physical Exam: Vitals:   11/25/19 1230 11/25/19 1245 11/25/19 1300 11/25/19 1315  BP:      Pulse: 74 72 75 73  Resp: 16 13 11 11   Temp:      TempSrc:      SpO2: 100% 100% 100% 100%    Constitutional: NAD, calm, comfortable Vitals:   11/25/19 1230 11/25/19 1245 11/25/19 1300 11/25/19 1315  BP:      Pulse: 74 72 75 73  Resp: 16 13 11 11   Temp:      TempSrc:      SpO2: 100% 100% 100% 100%   Eyes: PERRL, lids and conjunctivae normal ENMT: Mucous membranes are moist. Posterior pharynx clear of any exudate or lesions.Normal dentition.  Neck: normal, supple, no masses, no thyromegaly Respiratory: clear to auscultation bilaterally, no wheezing, no crackles. Normal respiratory effort. No accessory muscle use.  Cardiovascular: Regular rate and rhythm, soft blowing murmur on heart base. 2+ extremity edema. 2+ pedal pulses. No carotid bruits.  Abdomen: no tenderness, no masses palpated. No  hepatosplenomegaly. Bowel sounds positive.  Musculoskeletal: no clubbing / cyanosis. No joint deformity upper and lower extremities. Good ROM, no contractures. Normal muscle tone.  Skin: no rashes, lesions, ulcers. No induration Neurologic: CN 2-12 grossly intact. Sensation intact, DTR normal. Strength 5/5 in all 4.  Psychiatric: Normal judgment and insight. Alert and oriented x 3. Normal mood.     Labs on Admission: I have personally reviewed following labs and imaging studies  CBC: Recent Labs  Lab 11/25/19 1136  WBC 5.2  NEUTROABS 4.2  HGB 10.3*  HCT 33.0*  MCV 98.5  PLT 161   Basic  Metabolic Panel: Recent Labs  Lab 11/25/19 1136  NA 138  K 4.0  CL 101  CO2 24  GLUCOSE 135*  BUN 35*  CREATININE 1.09*  CALCIUM 8.4*   GFR: CrCl cannot be calculated (Unknown ideal weight.). Liver Function Tests: No results for input(s): AST, ALT, ALKPHOS, BILITOT, PROT, ALBUMIN in the last 168 hours. No results for input(s): LIPASE, AMYLASE in the last 168 hours. No results for input(s): AMMONIA in the last 168 hours. Coagulation Profile: Recent Labs  Lab 11/25/19 1136  INR 1.0   Cardiac Enzymes: No results for input(s): CKTOTAL, CKMB, CKMBINDEX, TROPONINI in the last 168 hours. BNP (last 3 results) No results for input(s): PROBNP in the last 8760 hours. HbA1C: No results for input(s): HGBA1C in the last 72 hours. CBG: No results for input(s): GLUCAP in the last 168 hours. Lipid Profile: No results for input(s): CHOL, HDL, LDLCALC, TRIG, CHOLHDL, LDLDIRECT in the last 72 hours. Thyroid Function Tests: No results for input(s): TSH, T4TOTAL, FREET4, T3FREE, THYROIDAB in the last 72 hours. Anemia Panel: No results for input(s): VITAMINB12, FOLATE, FERRITIN, TIBC, IRON, RETICCTPCT in the last 72 hours. Urine analysis:    Component Value Date/Time   COLORURINE YELLOW 08/23/2019 2246   APPEARANCEUR CLEAR 08/23/2019 2246   LABSPEC 1.036 (H) 08/23/2019 2246   PHURINE 5.0  08/23/2019 2246   GLUCOSEU NEGATIVE 08/23/2019 2246   GLUCOSEU NEGATIVE 11/06/2016 1022   HGBUR NEGATIVE 08/23/2019 2246   BILIRUBINUR NEGATIVE 08/23/2019 2246   BILIRUBINUR N 07/04/2019 1119   KETONESUR 5 (A) 08/23/2019 2246   PROTEINUR NEGATIVE 08/23/2019 2246   UROBILINOGEN 0.2 07/04/2019 1119   UROBILINOGEN 0.2 05/08/2018 1732   NITRITE NEGATIVE 08/23/2019 2246   LEUKOCYTESUR TRACE (A) 08/23/2019 2246    Radiological Exams on Admission: DG Ankle Complete Right  Result Date: 11/25/2019 CLINICAL DATA:  Fall EXAM: RIGHT ANKLE - COMPLETE 3+ VIEW COMPARISON:  None. FINDINGS: Osteopenia. There is no evidence of fracture, dislocation, or joint effusion. There is no evidence of arthropathy or other focal bone abnormality. Diffuse soft tissue edema. IMPRESSION: 1.  No fracture or dislocation of the right ankle. 2.  Osteopenia. 3.  Diffuse soft tissue edema. Electronically Signed   By: Eddie Candle M.D.   On: 11/25/2019 12:29   CT HEAD WO CONTRAST  Result Date: 11/25/2019 CLINICAL DATA:  Fall, head trauma EXAM: CT HEAD WITHOUT CONTRAST CT CERVICAL SPINE WITHOUT CONTRAST TECHNIQUE: Multidetector CT imaging of the head and cervical spine was performed following the standard protocol without intravenous contrast. Multiplanar CT image reconstructions of the cervical spine were also generated. COMPARISON:  07/25/2019 FINDINGS: CT HEAD FINDINGS Brain: No evidence of acute infarction, hemorrhage, hydrocephalus, extra-axial collection or mass lesion/mass effect. Mild subcortical white matter and periventricular small vessel ischemic changes. Vascular: Mild intracranial atherosclerosis. Skull: Normal. Negative for fracture or focal lesion. Sinuses/Orbits: Mild mucosal thickening in the bilateral maxillary sinuses. Minimal partial opacification of the right ethmoid air cells. Visualized paranasal sinuses and mastoid air cells are otherwise clear. Other: None. CT CERVICAL SPINE FINDINGS Alignment: Normal  cervical lordosis. Skull base and vertebrae: No acute fracture. No primary bone lesion or focal pathologic process. Soft tissues and spinal canal: No prevertebral fluid or swelling. No visible canal hematoma. Disc levels: Mild degenerative changes of the mid/lower cervical spine. Spinal canal is patent. Upper chest: Visualized lung apices are clear. Other: Visualized thyroid is unremarkable. IMPRESSION: No evidence of acute intracranial abnormality. Mild small vessel ischemic changes. No evidence of traumatic injury to the  cervical spine. Mild degenerative changes. Electronically Signed   By: Julian Hy M.D.   On: 11/25/2019 12:13   CT CERVICAL SPINE WO CONTRAST  Result Date: 11/25/2019 CLINICAL DATA:  Fall, head trauma EXAM: CT HEAD WITHOUT CONTRAST CT CERVICAL SPINE WITHOUT CONTRAST TECHNIQUE: Multidetector CT imaging of the head and cervical spine was performed following the standard protocol without intravenous contrast. Multiplanar CT image reconstructions of the cervical spine were also generated. COMPARISON:  07/25/2019 FINDINGS: CT HEAD FINDINGS Brain: No evidence of acute infarction, hemorrhage, hydrocephalus, extra-axial collection or mass lesion/mass effect. Mild subcortical white matter and periventricular small vessel ischemic changes. Vascular: Mild intracranial atherosclerosis. Skull: Normal. Negative for fracture or focal lesion. Sinuses/Orbits: Mild mucosal thickening in the bilateral maxillary sinuses. Minimal partial opacification of the right ethmoid air cells. Visualized paranasal sinuses and mastoid air cells are otherwise clear. Other: None. CT CERVICAL SPINE FINDINGS Alignment: Normal cervical lordosis. Skull base and vertebrae: No acute fracture. No primary bone lesion or focal pathologic process. Soft tissues and spinal canal: No prevertebral fluid or swelling. No visible canal hematoma. Disc levels: Mild degenerative changes of the mid/lower cervical spine. Spinal canal is  patent. Upper chest: Visualized lung apices are clear. Other: Visualized thyroid is unremarkable. IMPRESSION: No evidence of acute intracranial abnormality. Mild small vessel ischemic changes. No evidence of traumatic injury to the cervical spine. Mild degenerative changes. Electronically Signed   By: Julian Hy M.D.   On: 11/25/2019 12:13   DG Chest Port 1 View  Result Date: 11/25/2019 CLINICAL DATA:  Hip fracture. EXAM: PORTABLE CHEST 1 VIEW COMPARISON:  08/23/2019 FINDINGS: Stable changes from prior cardiac surgery and valve replacement. With cardiac silhouette is normal in size. No mediastinal or hilar masses. Clear lungs.  No pleural effusion or pneumothorax. Skeletal structures are grossly intact. IMPRESSION: No acute cardiopulmonary disease. Electronically Signed   By: Lajean Manes M.D.   On: 11/25/2019 13:34   DG Hip Unilat With Pelvis 2-3 Views Right  Result Date: 11/25/2019 CLINICAL DATA:  Fall, pain and deformity EXAM: DG HIP (WITH OR WITHOUT PELVIS) 2-3V RIGHT COMPARISON:  None. FINDINGS: There is a comminuted, displaced intratrochanteric fracture of the right hip with displaced fragments of the greater and lesser trochanters. No other fracture or dislocation of the pelvis or proximal left femur as included. Osteopenia. Large burden of stool and stool balls in the colon and rectum. IMPRESSION: Comminuted, displaced intratrochanteric fracture of the right hip. Electronically Signed   By: Eddie Candle M.D.   On: 11/25/2019 12:27    EKG: Independently reviewed. No ST-T changes  Assessment/Plan Principal Problem:   Hip fracture, right, closed, initial encounter [facility]) Active Problems:   Closed right hip fracture, initial encounter [facility])  Intratrochanteric fracture of the right hip From mechanical fall most likely OR for ORIF  Hx of Aortic Stenosis s/p biosynthetic valve replacement No S/S of re-stenosis, her baseline activity tolerance is good, able to tolerate 4 METS  activity Her overall cardiac 30 day risk for surgery/general anesthesia is 3.9% Medically cleared to proceed hip surgery  Peripheral edema Was documented on last admission in Dec 2021, DVT was ruled out then Send BNP Might be dependent edema, as she said she is very active walking around. Echo outpt  Osteopenia Shown on the hip X-ray, Check VitD level Start D and Calcium supplement Consult Nutrition  Anemia Chronic, H/H has been borderline low for at least 3 months, will send anemia panel.  Dementia Mentation at her baseline   DVT  prophylaxis: Lovenox  Code Status: DNR Family Communication: Will talk to Sylva who is on her way Disposition Plan: SNF vs Rehab Consults called: Dr. Erlinda Hong Admission status: Med Surg   Lequita Halt MD [facility] Pager [phone]    11/25/2019, 1:57 PM

## 2019-11-25 NOTE — ED Provider Notes (Signed)
[place] EMERGENCY DEPARTMENT Provider Note   CSN: [id number] Arrival date & time: 11/25/19  1015     History Chief Complaint  Patient presents with  . Fall  . Hip Pain    Nicole Hess is a 74 y.o. female with history of dementia and aortic valve stenosis s/p AVR who presents with a fall. The patient cannot provide a history due to dementia. History was obtained from EMS and nursing. She tells me that she had a fall but can't give any details. She denies any pain. She resides at [facility] care and had an unwitnessed fall. She was noted to have a head injury with a scalp laceration on the left side of the head and R hip deformity. Saw her yesterday. Had COVID 75 in early Dec. She has been NPO since 5pm.  LEVEL 5 caveat due to dementia.  HPI     Past Medical History:  Diagnosis Date  . Abnormal Pap smear   . Anemia   . Anxiety   . Aortic stenosis   . Bicuspid aortic valve   . Coronary artery disease   . Depression   . Gout   . Heart murmur   . Heart valve problem    will have follow up Echo-Dr Jordan-will have replacement in a couple of years  . Leukopenia    Seen by Dr. Lamonte Sakai, present since 2008, watchful waiting  . Memory difficulty 04/29/2017  . Osteopenia   . Seizures ([place])    as a child  . Shortness of breath dyspnea   . Vertigo 04/29/2017  . Vertigo     Patient Active Problem List   Diagnosis Date Noted  . Hypocalcemia 08/23/2019  . COVID-19 virus infection 08/23/2019  . Hypokalemia 08/23/2019  . Overactive bladder 05/20/2018  . Seasonal allergic rhinitis due to pollen 05/20/2018  . Vertigo 04/29/2017  . Memory difficulty 04/29/2017  . Dementia without behavioral disturbance ([place]) 02/06/2017  . 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 History of colonic polyps 11/09/2016  . S/P AVR (aortic valve replacement) 04/02/2015  . Aortic stenosis due to bicuspid aortic  valve 02/26/2015  . Severe aortic stenosis 05/02/2014  . Precordial pain 01/26/2013  . Depression 03/30/2012  . Hyperlipidemia 03/30/2012    Past Surgical History:  Procedure Laterality Date  . AORTIC VALVE REPLACEMENT N/A 02/26/2015   Procedure: AORTIC VALVE REPLACEMENT (AVR);  Surgeon: Gaye Pollack, MD;  Location: [facility];  Service: Open Heart Surgery;  Laterality: N/A;  . CARDIAC CATHETERIZATION N/A 01/29/2015   Procedure: Right/Left Heart Cath and Coronary Angiography;  Surgeon: Peter M Martinique, MD;  Location: [facility];  Service: Cardiovascular;  Laterality: N/A;  . CERVICAL CONE BIOPSY    . COLONOSCOPY  2010  . DOPPLER ECHOCARDIOGRAPHY  3/14  . GUM SURGERY    . PARTIAL HYSTERECTOMY    . TEE WITHOUT CARDIOVERSION N/A 02/26/2015   Procedure: TRANSESOPHAGEAL ECHOCARDIOGRAM (TEE);  Surgeon: Gaye Pollack, MD;  Location: [facility];  Service: Open Heart Surgery;  Laterality: N/A;  . TUBAL LIGATION    . VAGINAL HYSTERECTOMY     for dysplasia     OB History    Gravida  0   Para  0   Term  0   Preterm  0   AB  0   Living  0     SAB  0   TAB  0   Ectopic  0   Multiple  0   Live Births              Family History  Problem Relation Age of Onset  . Multiple myeloma Mother   . Stroke Father   . Hypertension Father   . Heart disease Father 87  . Kidney disease Father   . Cancer Father   . Cancer Maternal Grandmother        renal  . Osteoarthritis Brother     Social History   Tobacco Use  . Smoking status: Never Smoker  . Smokeless tobacco: Never Used  Substance Use Topics  . Alcohol use: Not Currently  . Drug use: No    Home Medications Prior to Admission medications   Medication Sig Start Date End Date Taking? Authorizing Provider  aspirin 81 MG chewable tablet Chew 81 mg by mouth daily.    [provider]  fluticasone (FLONASE) 50 MCG/ACT nasal spray Place 1 spray into both nostrils daily. 08/21/19   Virgel Manifold, MD  loratadine  (CLARITIN) 10 MG tablet Take 10 mg by mouth as needed for allergies.    [provider]  memantine (NAMENDA) 10 MG tablet Take 1 tablet (10 mg total) by mouth 2 (two) times daily. 06/02/18   Kathrynn Ducking, MD  pravastatin (PRAVACHOL) 40 MG tablet Take 1.5 tablets by mouth at bedtime.  09/26/18   [provider]  primidone (MYSOLINE) 250 MG tablet TAKE 1 TABLET BY MOUTH TWICE DAILY. Patient taking differently: Take 250 mg by mouth 2 (two) times daily.  05/17/18   Kathrynn Ducking, MD  psyllium (REGULOID) 0.52 g capsule Take 0.52 g by mouth at bedtime.    [provider]  sennosides-docusate sodium (SENOKOT-S) 8.6-50 MG tablet Take 1 tablet by mouth See admin instructions. Take 1 tablet by mouth daily at bedtime on Monday, Wednesday and Friday    [provider]  tamsulosin (FLOMAX) 0.4 MG CAPS capsule Take 1 capsule (0.4 mg total) by mouth daily. 08/30/19   Patrecia Pour, MD    Allergies    Dextromethorphan, Pseudoephedrine, and Penicillins  Review of Systems   Review of Systems  Unable to perform ROS: Dementia    Physical Exam Updated Vital Signs BP 120/71   Pulse 73   Temp 99.4 F (37.4 C) (Oral)   Resp 13   LMP 09/15/1987   SpO2 100%   Physical Exam Vitals and nursing note reviewed.  Constitutional:      General: She is not in acute distress.    Appearance: Normal appearance. She is well-developed. She is not ill-appearing.     Comments: Calm and cooperative. NAD. In C-collar  HENT:     Head: Normocephalic and atraumatic.  Eyes:     General: No scleral icterus.       Right eye: No discharge.        Left eye: No discharge.     Conjunctiva/sclera: Conjunctivae normal.     Pupils: Pupils are equal, round, and reactive to light.  Cardiovascular:     Rate and Rhythm: Normal rate and regular rhythm.     Heart sounds: Murmur present.  Pulmonary:     Effort: Pulmonary effort is normal. No respiratory distress.     Breath sounds: Normal  breath sounds.  Abdominal:     General: There is no distension.     Palpations: Abdomen is soft.     Tenderness: There is no abdominal tenderness.  Musculoskeletal:  Cervical back: Normal range of motion.     Right lower leg: Edema present.     Left lower leg: Edema present.     Comments: Right hip is shortened and externally rotated  Skin:    General: Skin is warm and dry.  Neurological:     Mental Status: She is alert and oriented to person, place, and time.  Psychiatric:        Behavior: Behavior normal.     ED Results / Procedures / Treatments   Labs (all labs ordered are listed, but only abnormal results are displayed) Labs Reviewed  BASIC METABOLIC PANEL - Abnormal; Notable for the following components:      Result Value   Glucose, Bld 135 (*)    BUN 35 (*)    Creatinine, Ser 1.09 (*)    Calcium 8.4 (*)    GFR calc non Af Amer 51 (*)    GFR calc Af Amer 59 (*)    All other components within normal limits  CBC WITH DIFFERENTIAL/PLATELET - Abnormal; Notable for the following components:   RBC 3.35 (*)    Hemoglobin 10.3 (*)    HCT 33.0 (*)    Lymphs Abs 0.5 (*)    All other components within normal limits  RESPIRATORY PANEL BY RT PCR (FLU A&B, COVID)  PROTIME-INR  TYPE AND SCREEN    EKG None  Radiology DG Ankle Complete Right  Result Date: 11/25/2019 CLINICAL DATA:  Fall EXAM: RIGHT ANKLE - COMPLETE 3+ VIEW COMPARISON:  None. FINDINGS: Osteopenia. There is no evidence of fracture, dislocation, or joint effusion. There is no evidence of arthropathy or other focal bone abnormality. Diffuse soft tissue edema. IMPRESSION: 1.  No fracture or dislocation of the right ankle. 2.  Osteopenia. 3.  Diffuse soft tissue edema. Electronically Signed   By: Eddie Candle M.D.   On: 11/25/2019 12:29   CT HEAD WO CONTRAST  Result Date: 11/25/2019 CLINICAL DATA:  Fall, head trauma EXAM: CT HEAD WITHOUT CONTRAST CT CERVICAL SPINE WITHOUT CONTRAST TECHNIQUE: Multidetector CT  imaging of the head and cervical spine was performed following the standard protocol without intravenous contrast. Multiplanar CT image reconstructions of the cervical spine were also generated. COMPARISON:  07/25/2019 FINDINGS: CT HEAD FINDINGS Brain: No evidence of acute infarction, hemorrhage, hydrocephalus, extra-axial collection or mass lesion/mass effect. Mild subcortical white matter and periventricular small vessel ischemic changes. Vascular: Mild intracranial atherosclerosis. Skull: Normal. Negative for fracture or focal lesion. Sinuses/Orbits: Mild mucosal thickening in the bilateral maxillary sinuses. Minimal partial opacification of the right ethmoid air cells. Visualized paranasal sinuses and mastoid air cells are otherwise clear. Other: None. CT CERVICAL SPINE FINDINGS Alignment: Normal cervical lordosis. Skull base and vertebrae: No acute fracture. No primary bone lesion or focal pathologic process. Soft tissues and spinal canal: No prevertebral fluid or swelling. No visible canal hematoma. Disc levels: Mild degenerative changes of the mid/lower cervical spine. Spinal canal is patent. Upper chest: Visualized lung apices are clear. Other: Visualized thyroid is unremarkable. IMPRESSION: No evidence of acute intracranial abnormality. Mild small vessel ischemic changes. No evidence of traumatic injury to the cervical spine. Mild degenerative changes. Electronically Signed   By: Julian Hy M.D.   On: 11/25/2019 12:13   CT CERVICAL SPINE WO CONTRAST  Result Date: 11/25/2019 CLINICAL DATA:  Fall, head trauma EXAM: CT HEAD WITHOUT CONTRAST CT CERVICAL SPINE WITHOUT CONTRAST TECHNIQUE: Multidetector CT imaging of the head and cervical spine was performed following the standard protocol without intravenous contrast.  Multiplanar CT image reconstructions of the cervical spine were also generated. COMPARISON:  07/25/2019 FINDINGS: CT HEAD FINDINGS Brain: No evidence of acute infarction, hemorrhage,  hydrocephalus, extra-axial collection or mass lesion/mass effect. Mild subcortical white matter and periventricular small vessel ischemic changes. Vascular: Mild intracranial atherosclerosis. Skull: Normal. Negative for fracture or focal lesion. Sinuses/Orbits: Mild mucosal thickening in the bilateral maxillary sinuses. Minimal partial opacification of the right ethmoid air cells. Visualized paranasal sinuses and mastoid air cells are otherwise clear. Other: None. CT CERVICAL SPINE FINDINGS Alignment: Normal cervical lordosis. Skull base and vertebrae: No acute fracture. No primary bone lesion or focal pathologic process. Soft tissues and spinal canal: No prevertebral fluid or swelling. No visible canal hematoma. Disc levels: Mild degenerative changes of the mid/lower cervical spine. Spinal canal is patent. Upper chest: Visualized lung apices are clear. Other: Visualized thyroid is unremarkable. IMPRESSION: No evidence of acute intracranial abnormality. Mild small vessel ischemic changes. No evidence of traumatic injury to the cervical spine. Mild degenerative changes. Electronically Signed   By: Julian Hy M.D.   On: 11/25/2019 12:13   DG Chest Port 1 View  Result Date: 11/25/2019 CLINICAL DATA:  Hip fracture. EXAM: PORTABLE CHEST 1 VIEW COMPARISON:  08/23/2019 FINDINGS: Stable changes from prior cardiac surgery and valve replacement. With cardiac silhouette is normal in size. No mediastinal or hilar masses. Clear lungs.  No pleural effusion or pneumothorax. Skeletal structures are grossly intact. IMPRESSION: No acute cardiopulmonary disease. Electronically Signed   By: Lajean Manes M.D.   On: 11/25/2019 13:34   DG Hip Unilat With Pelvis 2-3 Views Right  Result Date: 11/25/2019 CLINICAL DATA:  Fall, pain and deformity EXAM: DG HIP (WITH OR WITHOUT PELVIS) 2-3V RIGHT COMPARISON:  None. FINDINGS: There is a comminuted, displaced intratrochanteric fracture of the right hip with displaced fragments of  the greater and lesser trochanters. No other fracture or dislocation of the pelvis or proximal left femur as included. Osteopenia. Large burden of stool and stool balls in the colon and rectum. IMPRESSION: Comminuted, displaced intratrochanteric fracture of the right hip. Electronically Signed   By: Eddie Candle M.D.   On: 11/25/2019 12:27    Procedures Procedures (including critical care time)  Medications Ordered in ED Medications  aspirin chewable tablet 81 mg (has no administration in time range)  pravastatin (PRAVACHOL) tablet 60 mg (has no administration in time range)  memantine (NAMENDA) tablet 10 mg (has no administration in time range)  senna-docusate (Senokot-S) tablet 1 tablet (has no administration in time range)  tamsulosin (FLOMAX) capsule 0.4 mg (has no administration in time range)  primidone (MYSOLINE) tablet 250 mg (has no administration in time range)  fluticasone (FLONASE) 50 MCG/ACT nasal spray 1 spray (has no administration in time range)  loratadine (CLARITIN) tablet 10 mg (has no administration in time range)  HYDROcodone-acetaminophen (NORCO/VICODIN) 5-325 MG per tablet 1-2 tablet (has no administration in time range)  morphine 2 MG/ML injection 0.5 mg (has no administration in time range)  enoxaparin (LOVENOX) injection 40 mg (has no administration in time range)  calcium-vitamin D (OSCAL WITH D) 500-200 MG-UNIT per tablet 1 tablet (has no administration in time range)  lactated ringers bolus 500 mL (500 mLs Intravenous New Bag/Given 11/25/19 1244)    ED Course  I have reviewed the triage vital signs and the nursing notes.  Pertinent labs & imaging results that were available during my care of the patient were reviewed by me and considered in my medical decision making (see chart for  details).  74 year old female presents with unwitnessed fall and a head injury and right hip deformity.  Her vital signs are normal.  She is alert but unable to give a clear history  due to her underlying dementia.  On exam she has a small scalp laceration over the left side which will not require sutures.  Heart is regular rate and rhythm.  Lungs are clear to auscultation.  Abdomen is soft and nontender.  Her right hip is shortened and externally rotated.  She has full range of motion of her upper extremities and left lower extremity.  Will obtain CT head, C-spine, and x-ray of the right hip.  Discussed with [name], her power of attorney, who states that the patient also had a right ankle injury last night but had not had imaging of this so we will add this on as well.  CT head and C-spine are negative.  X-ray of the right hip shows a comminuted intertrochanteric fracture.  Right ankle x-ray is negative.  We will add on portable chest xray.  Labs show mild anemia and mild elevated creatinine.  We will give her a fluid bolus.  Preop labs are obtained.  EKG is sinus rhythm.  Discussed with Dr. Sherrian Divers with orthopedics who will have patient go to the OR later today.  Discussed with Dr. Roosevelt Locks with [facility] who will come to admit the patient.  MDM Rules/Calculators/A&P                       Final Clinical Impression(s) / ED Diagnoses Final diagnoses:  Fall, initial encounter  Laceration of scalp, initial encounter  Closed fracture of right hip, initial encounter ([place])  Sprain of right ankle, unspecified ligament, initial encounter    Rx / DC Orders ED Discharge Orders    None       Recardo Evangelist, PA-C 11/25/19 [place]    Milton Ferguson, MD 11/25/19 1948

## 2019-11-25 NOTE — Op Note (Signed)
   Date of Surgery: 11/25/2019  INDICATIONS: [name] is a 74 y.o.-year-old female who sustained a right hip fracture. The risks and benefits of the procedure discussed with the healthcare POA prior to the procedure and all questions were answered; consent was obtained.  PREOPERATIVE DIAGNOSIS: right intertrochanteric hip fracture   POSTOPERATIVE DIAGNOSIS: Same   PROCEDURE: Open treatment of intertrochanteric fracture with intramedullary implant. CPT [phone]   SURGEON: N. Eduard Roux, M.D.   ASSIST: none  ANESTHESIA: general   IV FLUIDS AND URINE: See anesthesia record   ESTIMATED BLOOD LOSS: 150 cc  IMPLANTS: [employer] InterTAN 10 x 40, 95/90 lag screws  DRAINS: None.   COMPLICATIONS: see description of procedure.   DESCRIPTION OF PROCEDURE: The patient was brought to the operating room and placed supine on the operating table. The patient's leg had been signed prior to the procedure. The patient had the anesthesia placed by the anesthesiologist. The prep verification and incision time-outs were performed to confirm that this was the correct patient, site, side and location. The patient had an SCD on the opposite lower extremity. The patient did receive antibiotics prior to the incision and was re-dosed during the procedure as needed at indicated intervals. The patient was positioned on the fracture table with the table in traction and internal rotation to reduce the hip. The well leg was placed in a scissor position and all bony prominences were well-padded. The patient had the lower extremity prepped and draped in the standard surgical fashion. The incision was made 4 finger breadths superior to the greater trochanter. A guide pin was inserted into the tip of the greater trochanter under fluoroscopic guidance. An opening reamer was used to gain access to the femoral canal. The nail length was measured and inserted down the femoral canal to its proper depth. The appropriate  version of insertion for the lag screw was found under fluoroscopy. A pin was inserted up the femoral neck through the jig. Then, a second antirotation pin was inserted inferior to the first pin. The length of the lag screw was then measured. The lag screw was inserted as near to center-center in the head as possible. The antirotation pin was then taken out and an interdigitating compression screw was placed in its place. The leg was taken out of traction, then the interdigitating compression screw was used to compress across the fracture. Compression was visualized on serial xrays.  A distal interlocking screw was placed using the perfect circle technique.  The wound was copiously irrigated with saline and the subcutaneous layer closed with 2.0 vicryl and the skin was reapproximated with staples. The wounds were cleaned and dried a final time and a sterile dressing was placed. The hip was taken through a range of motion at the end of the case under fluoroscopic imaging to visualize the approach-withdraw phenomenon and confirm implant length in the head. The patient was then awakened from anesthesia and taken to the recovery room in stable condition. All counts were correct at the end of the case.   POSTOPERATIVE PLAN: The patient will be weight bearing as tolerated and will return in 2 weeks for staple removal and the patient will receive DVT prophylaxis based on other medications, activity level, and risk ratio of bleeding to thrombosis.   Nicole Cecil, MD [facility] 4:29 PM

## 2019-11-25 NOTE — Discharge Instructions (Signed)
° ° °  1. Change dressings as needed °2. May shower but keep incisions covered and dry °3. Take lovenox to prevent blood clots °4. Take stool softeners as needed °5. Take pain meds as needed ° °

## 2019-11-26 LAB — CBC
HCT: 26.3 % — ABNORMAL LOW (ref 36.0–46.0)
Hemoglobin: 8.4 g/dL — ABNORMAL LOW (ref 12.0–15.0)
MCH: 30.9 pg (ref 26.0–34.0)
MCHC: 31.9 g/dL (ref 30.0–36.0)
MCV: 96.7 fL (ref 80.0–100.0)
Platelets: 133 10*3/uL — ABNORMAL LOW (ref 150–400)
RBC: 2.72 MIL/uL — ABNORMAL LOW (ref 3.87–5.11)
RDW: 12.6 % (ref 11.5–15.5)
WBC: 6.6 10*3/uL (ref 4.0–10.5)
nRBC: 0 % (ref 0.0–0.2)

## 2019-11-26 LAB — BASIC METABOLIC PANEL
Anion gap: 9 (ref 5–15)
BUN: 20 mg/dL (ref 8–23)
CO2: 27 mmol/L (ref 22–32)
Calcium: 8.2 mg/dL — ABNORMAL LOW (ref 8.9–10.3)
Chloride: 102 mmol/L (ref 98–111)
Creatinine, Ser: 0.67 mg/dL (ref 0.44–1.00)
GFR calc Af Amer: >60 mL/min (ref >60)
GFR calc non Af Amer: >60 mL/min (ref >60)
Glucose, Bld: 131 mg/dL — ABNORMAL HIGH (ref 70–99)
Potassium: 3.9 mmol/L (ref 3.5–5.1)
Sodium: 138 mmol/L (ref 135–145)

## 2019-11-26 MED ORDER — OXYCODONE-ACETAMINOPHEN 5-325 MG PO TABS: 1.0000 | ORAL_TABLET | Freq: Three times a day (TID) | ORAL | 0 refills | Status: DC | PRN

## 2019-11-26 MED ORDER — ENOXAPARIN SODIUM 40 MG/0.4ML ~~LOC~~ SOLN: 40.0000 mg | Freq: Every day | SUBCUTANEOUS | 13 refills | Status: AC

## 2019-11-26 NOTE — Progress Notes (Signed)
Occupational Therapy Evaluation Patient Details Name: Nicole Hess MRN: [id number] DOB: 1946/01/13 Today's Date: 11/26/2019    History of Present Illness Nicole Hess is a 74 y.o. female with medical history significant of Aortic stenosis s/p biosynthetic valve replacement in 2016, CAD, Depression, gout, Leukopenia, Dementia, who is a long term NH resident presented with episode of fall. Pt woke up this morning, while trying to get out of bed, she lost her balance and fell toward her right side and landed on her right side, hit her hip and ankle and forehead on the right side. No LOC, no prodromes of blurry vision or light headedness, no chest pain or SOB. Her baseline activity tolerance is good, able to walk without help for more than 15-20 min, never had SOB or chest pain. No weakness or numbness of any of her limbs. She had COVID PNA in Dec 2020 and recovered. Pt sustained a right intertrochanteric hip fracture and underwent an open treatment of intertrochanteric fracture with intramedullary implant on 11/25/19. Pt is WBAT RLE.    Clinical Impression   PTA pt was a long-term nursing home resident on a memory care unit. PLOF obtained from staff who reported that the pt ambulated independently without an assistive device. Pt requires supervision and cueing to complete all ADLs. Pt currently requires setup to total assist for self-care and functional transfer tasks. Pt tolerated sitting edge of bed ~15 min while engaging in self-care tasks. Pt able to don LLE sock with min assist while seated EOB, requiring total assist to don RLE. Pt tolerated standing 2 times with RW and mod assist. Noted significant retro lean in standing. RLE buckling when attempting to march in place. Utilized sara stedy to transfer to bedside chair due to safety concerns. Pt demonstrates decreased strength, endurance, balance, standing tolerance, activity tolerance, and safety awareness impacting ability to complete self-care  and functional transfer tasks. Recommend skilled OT services to address above deficits in order to promote function and prevent further decline. Recommend SNF placement for additional rehab due to decline from PLOF.     Follow Up Recommendations  SNF    Equipment Recommendations  Other (comment)(TBD at next venue of care)    Recommendations for Other Services       Precautions / Restrictions Precautions Precautions: Fall Restrictions Weight Bearing Restrictions: Yes RLE Weight Bearing: Weight bearing as tolerated      Mobility Bed Mobility Overal bed mobility: Needs Assistance Bed Mobility: Supine to Sit     Supine to sit: Mod assist;HOB elevated     General bed mobility comments: Good core strength. Assist to move RLE. Assist to scoot EOB.   Transfers Overall transfer level: Needs assistance Equipment used: Rolling walker (2 wheeled) Transfers: Sit to/from [facility] Sit to Stand: Mod assist(Completed x 2 with RW) Stand pivot transfers: Total assist(with use of sara stedy)       General transfer comment: Pt stood x 2 with mod assist. Significant retro lean in standing. RLE buckling when attempting to march in place. Utilized sara stedy for transfer to chair due to safety concerns.     Balance Overall balance assessment: Needs assistance Sitting-balance support: Feet supported Sitting balance-Leahy Scale: Fair       Standing balance-Leahy Scale: Poor                             ADL either performed or assessed with clinical judgement   ADL Overall  ADL's : Needs assistance/impaired Eating/Feeding: Set up;Supervision/ safety;Sitting   Grooming: Set up;Supervision/safety;Sitting   Upper Body Bathing: Set up;Supervision/ safety;Sitting   Lower Body Bathing: Moderate assistance;Maximal assistance;Sitting/lateral leans;Sit to/from stand   Upper Body Dressing : Set up;Supervision/safety;Sitting   Lower Body Dressing: Moderate  assistance;Maximal assistance;Sitting/lateral leans;Sit to/from stand   Toilet Transfer: Total assistance;BSC(with use of sara stedy)   Toileting- Clothing Manipulation and Hygiene: Sit to/from stand;Total assistance       Functional mobility during ADLs: Total assistance(use of sara stedy) General ADL Comments: Pt able to stand x 2 with RW and mod assist. RLE buckling when attempting to step. Utilized sara stedy due to safety concerns.      Vision Baseline Vision/History: Wears glasses Wears Glasses: At all times       Perception     Praxis      Pertinent Vitals/Pain Pain Assessment: Faces Faces Pain Scale: Hurts a little bit Pain Location: RLE with movement Pain Descriptors / Indicators: Grimacing;Guarding Pain Intervention(s): Monitored during session     Hand Dominance Right   Extremity/Trunk Assessment Upper Extremity Assessment Upper Extremity Assessment: Generalized weakness   Lower Extremity Assessment Lower Extremity Assessment: Defer to PT evaluation       Communication Communication Communication: Expressive difficulties   Cognition Arousal/Alertness: Awake/alert Behavior During Therapy: Restless(Confused, A&O x 2) Overall Cognitive Status: History of cognitive impairments - at baseline                                 General Comments: Pt confused, continually asking same questions every ~15 seconds. Pt required mod redirection back to tasks as she is easily distracted by environment and personal conversation.   General Comments  No signs/symptoms of distress during activity. Pt setup in bedside chair at end of session.     Exercises     Shoulder Instructions      Home Living Family/patient expects to be discharged to:: Other (Comment)(Memory care unit)                                 Additional Comments: Per chart, pt is able to ambulate independently without an assistive device. Spoke to memory care unit to obtain  PLOF. Per staff, pt ambulates without an assistive device and has had 2 falls in the last 6 months. Pt is able to complete all ADLs with supervision and cueing.       Prior Functioning/Environment Level of Independence: Needs assistance  Gait / Transfers Assistance Needed: Per staff, pt ambulates independently without an assistive device. Pt has had 2 falls in the last 6 months.  ADL's / Homemaking Assistance Needed: Per staff pt requires supervision and cueing to complete all ADLs.             OT Problem List: Decreased strength;Decreased activity tolerance;Impaired balance (sitting and/or standing);Decreased cognition;Decreased safety awareness      OT Treatment/Interventions: Self-care/ADL training;Therapeutic exercise;Neuromuscular education;Energy conservation;DME and/or AE instruction;Therapeutic activities;Patient/family education;Balance training    OT Goals(Current goals can be found in the care plan section) Acute Rehab OT Goals Patient Stated Goal: Pt agreeable to working with therapy Time For Goal Achievement: 12/10/19 Potential to Achieve Goals: Good ADL Goals Pt Will Perform Grooming: with min guard assist;standing Pt Will Perform Lower Body Dressing: with min assist;sit to/from stand Pt Will Transfer to Toilet: with min assist;bedside commode Pt Will Perform Toileting -  Clothing Manipulation and hygiene: with min assist;sit to/from stand Additional ADL Goal #1: Pt to tolerate standing up to 5 min with min assist in preparation for ADLs.  OT Frequency: Min 2X/week   Barriers to D/C:            Co-evaluation              AM-PAC OT "6 Clicks" Daily Activity     Outcome Measure Help from another person eating meals?: A Little Help from another person taking care of personal grooming?: A Little Help from another person toileting, which includes using toliet, bedpan, or urinal?: A Lot Help from another person bathing (including washing, rinsing, drying)?: A  Lot Help from another person to put on and taking off regular upper body clothing?: A Little Help from another person to put on and taking off regular lower body clothing?: A Lot 6 Click Score: 15   End of Session Equipment Utilized During Treatment: Gait belt;Rolling walker;Other (comment)(sara stedy) Nurse Communication: Mobility status;Need for lift equipment  Activity Tolerance: Patient limited by fatigue Patient left: in chair;with call bell/phone within reach;with chair alarm set  OT Visit Diagnosis: Unsteadiness on feet (R26.81);Repeated falls (R29.6);Other abnormalities of gait and mobility (R26.89);Muscle weakness (generalized) (M62.81);History of falling (Z91.81)                Time: [id number] OT Time Calculation (min): 30 min Charges:  OT General Charges $OT Visit: 1 Visit OT Evaluation $OT Eval Moderate Complexity: 1 Mod OT Treatments $Therapeutic Activity: 8-22 mins  Mauri Brooklyn OTR/L [phone]   Mauri Brooklyn 11/26/2019, 10:12 AM

## 2019-11-26 NOTE — Progress Notes (Signed)
PROGRESS NOTE    PEARL CLAMP  [id number] DOB: 1946-06-20 DOA: 11/25/2019 PCP: Reymundo Poll, MD      Brief Narrative:  Mrs. Nadon is a 74 y.o. F with dementia, SNF dwelling, bicuspid aortic valve s/p bioprosthetic replacement 2016, and CAD no recent PCI who presented with fall and hip pain.  In the ER, found to have RIGHT intertrochanteric hip fracture.  To OR with Dr. Erlinda Hong.      Assessment & Plan:  Right intertrochanteric hip fracture S/p ORIF right hip by Dr. Erlinda Hong on 3/13 -Continue scheduled acetaminophen -Continue as needed oxycodone -Lovenox for DVT prophylaxis -Continue calcium and vitamin D -Hold Lasix  Dementia -Continue memantine  Acute blood loss anemia on anemia of chronic disease Expected blood loss postoperatively.  Transfusion threshold 7 g/dL  History of aortic valve repair  Essential tremor -Continue primidone  Coronary disease -Hold Lasix -Continue baby aspirin        Disposition: The patient was admitted with hip fracture.  She is normally ambulatory, she will require significant rehabilitation.  She is postoperative day 1.         MDM: The below labs and imaging reports were reviewed and summarized above.  Medication management as above.    DVT prophylaxis: Lovenox Code Status: DNR Family Communication: Friend by phone    Consultants:   Orthopedics  Procedures:   3/13 intramedullary implant/ORIF right hip  Antimicrobials:      Culture data:              Subjective: Patient's pain is well controlled.  She is somewhat sore.  She has had no dizziness, lightheadedness, increasing confusion, agitation, chest pain, palpitations, focal weakness or numbness.  Objective: Vitals:   11/25/19 2003 11/25/19 2349 11/26/19 0409 11/26/19 0810  BP: (!) 114/52 (!) 133/59 (!) 150/63 (!) 115/55  Pulse: 68 75 85 83  Resp:    17  Temp: 97.6 F (36.4 C) 97.7 F (36.5 C) 98.7 F (37.1 C) 98.4 F (36.9 C)   TempSrc: Oral Oral Oral Oral  SpO2: 100% 100% 100% 100%  Weight:        Intake/Output Summary (Last 24 hours) at 11/26/2019 1355 Last data filed at 11/26/2019 1000 Gross per 24 hour  Intake 3159.97 ml  Output 2390 ml  Net 769.97 ml   Filed Weights   11/25/19 1858  Weight: 65 kg    Examination: General appearance:  adult female, alert and in no acute distress.  Sitting up in recliner, eating lunch. HEENT: Anicteric, conjunctiva pink, lids and lashes normal. No nasal deformity, discharge, epistaxis.  Lips moist.  Dentition in good repair, oropharynx moist, no oral lesions.  Hearing normal. Skin: Warm and dry.  No jaundice.  No suspicious rashes or lesions. Cardiac: RRR, nl S1-S2, no murmurs appreciated.  Capillary refill is brisk.  JVP normal.  No LE edema.  Radial pulses 2+ and symmetric. Respiratory: Normal respiratory rate and rhythm.  CTAB without rales or wheezes. Abdomen: Abdomen soft.  No TTP or guarding. No ascites, distension, hepatosplenomegaly.   MSK: No deformities or effusions. Neuro: Awake and alert.  EOMI, moves upper extremities with normal strength and coordination. Speech fluent.   Strength not tested due to pain. Psych: Sensorium intact and responding to questions, attention normal. Affect pleasant.  Judgment and insight appear impaired.  The patient is trying to talk to the person on the TV into her call button as if it is a telephone.    Data Reviewed: I have personally  reviewed following labs and imaging studies:  CBC: Recent Labs  Lab 11/25/19 1136 11/26/19 0339  WBC 5.2 6.6  NEUTROABS 4.2  --   HGB 10.3* 8.4*  HCT 33.0* 26.3*  MCV 98.5 96.7  PLT 161 [id number]*   Basic Metabolic Panel: Recent Labs  Lab 11/25/19 1136 11/26/19 0339  NA 138 138  K 4.0 3.9  CL 101 102  CO2 24 27  GLUCOSE 135* 131*  BUN 35* 20  CREATININE 1.09* 0.67  CALCIUM 8.4* 8.2*   GFR: Estimated Creatinine Clearance: 60.4 mL/min (by C-G formula based on SCr of 0.67  mg/dL). Liver Function Tests: No results for input(s): AST, ALT, ALKPHOS, BILITOT, PROT, ALBUMIN in the last 168 hours. No results for input(s): LIPASE, AMYLASE in the last 168 hours. No results for input(s): AMMONIA in the last 168 hours. Coagulation Profile: Recent Labs  Lab 11/25/19 1136  INR 1.0   Cardiac Enzymes: No results for input(s): CKTOTAL, CKMB, CKMBINDEX, TROPONINI in the last 168 hours. BNP (last 3 results) No results for input(s): PROBNP in the last 8760 hours. HbA1C: No results for input(s): HGBA1C in the last 72 hours. CBG: No results for input(s): GLUCAP in the last 168 hours. Lipid Profile: No results for input(s): CHOL, HDL, LDLCALC, TRIG, CHOLHDL, LDLDIRECT in the last 72 hours. Thyroid Function Tests: No results for input(s): TSH, T4TOTAL, FREET4, T3FREE, THYROIDAB in the last 72 hours. Anemia Panel: Recent Labs    11/25/19 1649  FERRITIN 63  TIBC 245*  IRON 13*  RETICCTPCT 0.9   Urine analysis:    Component Value Date/Time   COLORURINE YELLOW 08/23/2019 2246   APPEARANCEUR CLEAR 08/23/2019 2246   LABSPEC 1.036 (H) 08/23/2019 2246   PHURINE 5.0 08/23/2019 2246   GLUCOSEU NEGATIVE 08/23/2019 2246   GLUCOSEU NEGATIVE 11/06/2016 1022   HGBUR NEGATIVE 08/23/2019 2246   BILIRUBINUR NEGATIVE 08/23/2019 2246   BILIRUBINUR N 07/04/2019 1119   KETONESUR 5 (A) 08/23/2019 2246   PROTEINUR NEGATIVE 08/23/2019 2246   UROBILINOGEN 0.2 07/04/2019 1119   UROBILINOGEN 0.2 05/08/2018 1732   NITRITE NEGATIVE 08/23/2019 2246   LEUKOCYTESUR TRACE (A) 08/23/2019 2246   Sepsis Labs: @LABRCNTIP (procalcitonin:4,lacticacidven:4)  ) Recent Results (from the past 240 hour(s))  Respiratory Panel by RT PCR (Flu A&B, Covid) - Nasopharyngeal Swab     Status: None   Collection Time: 11/25/19 11:36 AM   Specimen: Nasopharyngeal Swab  Result Value Ref Range Status   SARS Coronavirus 2 by RT PCR NEGATIVE NEGATIVE Final    Comment: (NOTE) SARS-CoV-2 target nucleic acids  are NOT DETECTED. The SARS-CoV-2 RNA is generally detectable in upper respiratoy specimens during the acute phase of infection. The lowest concentration of SARS-CoV-2 viral copies this assay can detect is 131 copies/mL. A negative result does not preclude SARS-Cov-2 infection and should not be used as the sole basis for treatment or other patient management decisions. A negative result may occur with  improper specimen collection/handling, submission of specimen other than nasopharyngeal swab, presence of viral mutation(s) within the areas targeted by this assay, and inadequate number of viral copies (<131 copies/mL). A negative result must be combined with clinical observations, patient history, and epidemiological information. The expected result is Negative. Fact Sheet for Patients:  PinkCheek.be Fact Sheet for Healthcare Providers:  GravelBags.it This test is not yet ap proved or cleared by the [place] FDA and  has been authorized for detection and/or diagnosis of SARS-CoV-2 by FDA under an Emergency Use Authorization (EUA). This EUA will remain  in effect (meaning this test can be used) for the duration of the COVID-19 declaration under Section 564(b)(1) of the Act, 21 U.S.C. section 360bbb-3(b)(1), unless the authorization is terminated or revoked sooner.    Influenza A by PCR NEGATIVE NEGATIVE Final   Influenza B by PCR NEGATIVE NEGATIVE Final    Comment: (NOTE) The Xpert Xpress SARS-CoV-2/FLU/RSV assay is intended as an aid in  the diagnosis of influenza from Nasopharyngeal swab specimens and  should not be used as a sole basis for treatment. Nasal washings and  aspirates are unacceptable for Xpert Xpress SARS-CoV-2/FLU/RSV  testing. Fact Sheet for Patients: PinkCheek.be Fact Sheet for Healthcare Providers: GravelBags.it This test is not yet approved or  cleared by the [place] FDA and  has been authorized for detection and/or diagnosis of SARS-CoV-2 by  FDA under an Emergency Use Authorization (EUA). This EUA will remain  in effect (meaning this test can be used) for the duration of the  Covid-19 declaration under Section 564(b)(1) of the Act, 21  U.S.C. section 360bbb-3(b)(1), unless the authorization is  terminated or revoked. Performed at [facility], [place] [address], [place], [place] [address]          Radiology Studies: DG Ankle Complete Right  Result Date: 11/25/2019 CLINICAL DATA:  Fall EXAM: RIGHT ANKLE - COMPLETE 3+ VIEW COMPARISON:  None. FINDINGS: Osteopenia. There is no evidence of fracture, dislocation, or joint effusion. There is no evidence of arthropathy or other focal bone abnormality. Diffuse soft tissue edema. IMPRESSION: 1.  No fracture or dislocation of the right ankle. 2.  Osteopenia. 3.  Diffuse soft tissue edema. Electronically Signed   By: Eddie Candle M.D.   On: 11/25/2019 12:29   CT HEAD WO CONTRAST  Result Date: 11/25/2019 CLINICAL DATA:  Fall, head trauma EXAM: CT HEAD WITHOUT CONTRAST CT CERVICAL SPINE WITHOUT CONTRAST TECHNIQUE: Multidetector CT imaging of the head and cervical spine was performed following the standard protocol without intravenous contrast. Multiplanar CT image reconstructions of the cervical spine were also generated. COMPARISON:  07/25/2019 FINDINGS: CT HEAD FINDINGS Brain: No evidence of acute infarction, hemorrhage, hydrocephalus, extra-axial collection or mass lesion/mass effect. Mild subcortical white matter and periventricular small vessel ischemic changes. Vascular: Mild intracranial atherosclerosis. Skull: Normal. Negative for fracture or focal lesion. Sinuses/Orbits: Mild mucosal thickening in the bilateral maxillary sinuses. Minimal partial opacification of the right ethmoid air cells. Visualized paranasal sinuses and mastoid air cells are otherwise clear. Other: None. CT  CERVICAL SPINE FINDINGS Alignment: Normal cervical lordosis. Skull base and vertebrae: No acute fracture. No primary bone lesion or focal pathologic process. Soft tissues and spinal canal: No prevertebral fluid or swelling. No visible canal hematoma. Disc levels: Mild degenerative changes of the mid/lower cervical spine. Spinal canal is patent. Upper chest: Visualized lung apices are clear. Other: Visualized thyroid is unremarkable. IMPRESSION: No evidence of acute intracranial abnormality. Mild small vessel ischemic changes. No evidence of traumatic injury to the cervical spine. Mild degenerative changes. Electronically Signed   By: Julian Hy M.D.   On: 11/25/2019 12:13   CT CERVICAL SPINE WO CONTRAST  Result Date: 11/25/2019 CLINICAL DATA:  Fall, head trauma EXAM: CT HEAD WITHOUT CONTRAST CT CERVICAL SPINE WITHOUT CONTRAST TECHNIQUE: Multidetector CT imaging of the head and cervical spine was performed following the standard protocol without intravenous contrast. Multiplanar CT image reconstructions of the cervical spine were also generated. COMPARISON:  07/25/2019 FINDINGS: CT HEAD FINDINGS Brain: No evidence of acute infarction, hemorrhage, hydrocephalus, extra-axial collection or  mass lesion/mass effect. Mild subcortical white matter and periventricular small vessel ischemic changes. Vascular: Mild intracranial atherosclerosis. Skull: Normal. Negative for fracture or focal lesion. Sinuses/Orbits: Mild mucosal thickening in the bilateral maxillary sinuses. Minimal partial opacification of the right ethmoid air cells. Visualized paranasal sinuses and mastoid air cells are otherwise clear. Other: None. CT CERVICAL SPINE FINDINGS Alignment: Normal cervical lordosis. Skull base and vertebrae: No acute fracture. No primary bone lesion or focal pathologic process. Soft tissues and spinal canal: No prevertebral fluid or swelling. No visible canal hematoma. Disc levels: Mild degenerative changes of the  mid/lower cervical spine. Spinal canal is patent. Upper chest: Visualized lung apices are clear. Other: Visualized thyroid is unremarkable. IMPRESSION: No evidence of acute intracranial abnormality. Mild small vessel ischemic changes. No evidence of traumatic injury to the cervical spine. Mild degenerative changes. Electronically Signed   By: Julian Hy M.D.   On: 11/25/2019 12:13   DG Chest Port 1 View  Result Date: 11/25/2019 CLINICAL DATA:  Hip fracture. EXAM: PORTABLE CHEST 1 VIEW COMPARISON:  08/23/2019 FINDINGS: Stable changes from prior cardiac surgery and valve replacement. With cardiac silhouette is normal in size. No mediastinal or hilar masses. Clear lungs.  No pleural effusion or pneumothorax. Skeletal structures are grossly intact. IMPRESSION: No acute cardiopulmonary disease. Electronically Signed   By: Lajean Manes M.D.   On: 11/25/2019 13:34   DG C-Arm 1-60 Min  Result Date: 11/25/2019 CLINICAL DATA:  Imaging provided for ORIF for a right proximal femur fracture. EXAM: DG C-ARM 1-60 MIN; RIGHT FEMUR 2 VIEWS COMPARISON:  11/25/2019 at 12:02 p.m. FINDINGS: Submitted images show placement an intramedullary rod supporting 2 compression screws, reducing the primary intertrochanteric fracture components into near anatomic alignment. The orthopedic hardware appears well seated. IMPRESSION: Well aligned proximal right femur fracture following ORIF. Electronically Signed   By: Lajean Manes M.D.   On: 11/25/2019 17:18   DG Hip Unilat With Pelvis 2-3 Views Right  Result Date: 11/25/2019 CLINICAL DATA:  Fall, pain and deformity EXAM: DG HIP (WITH OR WITHOUT PELVIS) 2-3V RIGHT COMPARISON:  None. FINDINGS: There is a comminuted, displaced intratrochanteric fracture of the right hip with displaced fragments of the greater and lesser trochanters. No other fracture or dislocation of the pelvis or proximal left femur as included. Osteopenia. Large burden of stool and stool balls in the colon and  rectum. IMPRESSION: Comminuted, displaced intratrochanteric fracture of the right hip. Electronically Signed   By: Eddie Candle M.D.   On: 11/25/2019 12:27   DG FEMUR, MIN 2 VIEWS RIGHT  Result Date: 11/25/2019 CLINICAL DATA:  Imaging provided for ORIF for a right proximal femur fracture. EXAM: DG C-ARM 1-60 MIN; RIGHT FEMUR 2 VIEWS COMPARISON:  11/25/2019 at 12:02 p.m. FINDINGS: Submitted images show placement an intramedullary rod supporting 2 compression screws, reducing the primary intertrochanteric fracture components into near anatomic alignment. The orthopedic hardware appears well seated. IMPRESSION: Well aligned proximal right femur fracture following ORIF. Electronically Signed   By: Lajean Manes M.D.   On: 11/25/2019 17:18        Scheduled Meds: . aspirin  81 mg Oral Daily  . calcium-vitamin D  1 tablet Oral TID  . docusate sodium  100 mg Oral BID  . enoxaparin (LOVENOX) injection  40 mg Subcutaneous Q24H  . [START ON 11/27/2019] estradiol  1 Applicatorful Vaginal Q Mon  . furosemide  20 mg Oral Daily  . memantine  10 mg Oral BID  . primidone  250 mg Oral BID  . [  START ON 11/27/2019] senna-docusate  1 tablet Oral Q M,W,F-1800   Continuous Infusions: . sodium chloride Stopped (11/26/19 0921)  . lactated ringers    . methocarbamol (ROBAXIN) IV       LOS: 1 day    Time spent: 25 minutes    Edwin Dada, MD [facility] 11/26/2019, 1:55 PM     Please page though [place] or Epic secure chat:  For [employer], Adult nurse

## 2019-11-26 NOTE — Progress Notes (Signed)
   Subjective:  Patient reports pain as mild.   No events.  Objective:   VITALS:   Vitals:   11/25/19 2003 11/25/19 2349 11/26/19 0409 11/26/19 0810  BP: (!) 114/52 (!) 133/59 (!) 150/63 (!) 115/55  Pulse: 68 75 85 83  Resp:    17  Temp: 97.6 F (36.4 C) 97.7 F (36.5 C) 98.7 F (37.1 C) 98.4 F (36.9 C)  TempSrc: Oral Oral Oral Oral  SpO2: 100% 100% 100% 100%  Weight:        Dressings c/d/i NVI distally Mild swelling   Lab Results  Component Value Date   WBC 6.6 11/26/2019   HGB 8.4 (L) 11/26/2019   HCT 26.3 (L) 11/26/2019   MCV 96.7 11/26/2019   PLT 133 (L) 11/26/2019     Assessment/Plan:  1 Day Post-Op   - Expected postop acute blood loss anemia - will monitor for symptoms - Up with PT/OT - DVT ppx - SCDs, ambulation, lovenox - WBAT operative extremity  Eduard Roux 11/26/2019, 9:02 AM [phone]

## 2019-11-26 NOTE — Evaluation (Signed)
Physical Therapy Evaluation Patient Details Name: Nicole Hess MRN: [id number] DOB: 03-02-1946 Today's Date: 11/26/2019   History of Present Illness  74 yo female admitted with R hip fx after falling at her facility. S/P IM nail 11/25/19. Hx of dementia, gout, CAD, aortic stenosis  Clinical Impression  On eval, pt required Mod assist for bed mobility and standing. She was able to take a few side steps along the side of the bed using a RW. Will likely need +2 assist for ambulation. Posterior bias initially but this improved some with time. R LE instability/buckling with WBing. Will continue to follow and progress activity as tolerated. Recommend SNF for continued rehab.     Follow Up Recommendations SNF    Equipment Recommendations  None recommended by PT    Recommendations for Other Services       Precautions / Restrictions Precautions Precautions: Fall Restrictions Weight Bearing Restrictions: No RLE Weight Bearing: Weight bearing as tolerated      Mobility  Bed Mobility Overal bed mobility: Needs Assistance Bed Mobility: Supine to Sit;Sit to Supine     Supine to sit: Mod assist;HOB elevated Sit to supine: Mod assist;HOB elevated   General bed mobility comments: Assist for LEs and scooting, positioning in bed. Increased time. Multimodal cueing required  Transfers Overall transfer level: Needs assistance Equipment used: Rolling walker (2 wheeled) Transfers: Sit to/from Stand Sit to Stand: Mod assist         General transfer comment: Assist to rise, stabilize, control descent. Posterior bias. R LE instability with stepping.  Ambulation/Gait             General Gait Details: Side steps along bedside with RW. Increased time. Assist to stabilize pt and manage RW. Will require +2 for attempt at ambulation  Stairs            Wheelchair Mobility    Modified Rankin (Stroke Patients Only)       Balance Overall balance assessment: Needs  assistance;History of Falls         Standing balance support: Bilateral upper extremity supported Standing balance-Leahy Scale: Poor                               Pertinent Vitals/Pain Pain Assessment: Faces Faces Pain Scale: Hurts little more Pain Location: RLE with movement Pain Descriptors / Indicators: Grimacing;Guarding Pain Intervention(s): Limited activity within patient's tolerance;Monitored during session;Repositioned    Home Living Family/patient expects to be discharged to:: Unsure     Type of Home: Assisted living Home Access: Level entry     Home Layout: One level        Prior Function     Gait / Transfers Assistance Needed: Per staff, pt ambulates independently without an assistive device. Pt has had 2 falls in the last 6 months.   ADL's / Homemaking Assistance Needed: Per staff, pt requires supervision and cueing to complete all ADLs.        Hand Dominance        Extremity/Trunk Assessment   Upper Extremity Assessment Upper Extremity Assessment: Defer to OT evaluation    Lower Extremity Assessment Lower Extremity Assessment: Generalized weakness       Communication   Communication: Expressive difficulties  Cognition Arousal/Alertness: Awake/alert Behavior During Therapy: WFL for tasks assessed/performed Overall Cognitive Status: History of cognitive impairments - at baseline  General Comments: She follows commands with time and repeated cues      General Comments      Exercises     Assessment/Plan    PT Assessment Patient needs continued PT services  PT Problem List Decreased strength;Decreased mobility;Decreased activity tolerance;Decreased balance;Decreased cognition;Decreased knowledge of use of DME;Pain       PT Treatment Interventions DME instruction;Gait training;Therapeutic activities;Therapeutic exercise;Patient/family education;Balance training;Functional  mobility training    PT Goals (Current goals can be found in the Care Plan section)  Acute Rehab PT Goals Patient Stated Goal: none stated. pt agreeable to working with therapy PT Goal Formulation: With patient Time For Goal Achievement: 12/10/19 Potential to Achieve Goals: Good    Frequency Min 3X/week   Barriers to discharge        Co-evaluation               AM-PAC PT "6 Clicks" Mobility  Outcome Measure Help needed turning from your back to your side while in a flat bed without using bedrails?: A Lot Help needed moving from lying on your back to sitting on the side of a flat bed without using bedrails?: A Lot Help needed moving to and from a bed to a chair (including a wheelchair)?: A Lot Help needed standing up from a chair using your arms (e.g., wheelchair or bedside chair)?: A Lot Help needed to walk in hospital room?: A Lot Help needed climbing 3-5 steps with a railing? : Total 6 Click Score: 11    End of Session Equipment Utilized During Treatment: Gait belt Activity Tolerance: Patient tolerated treatment well Patient left: in bed;with call bell/phone within reach;with bed alarm set   PT Visit Diagnosis: History of falling (Z91.81);Muscle weakness (generalized) (M62.81);Difficulty in walking, not elsewhere classified (R26.2);Other abnormalities of gait and mobility (R26.89)    Time: 1356-1410 PT Time Calculation (min) (ACUTE ONLY): 14 min   Charges:   PT Evaluation $PT Eval Moderate Complexity: 1 Mod             Donn Wilmot P, PT Acute Rehabilitation

## 2019-11-26 NOTE — Plan of Care (Signed)

## 2019-11-27 DIAGNOSIS — D62 Acute posthemorrhagic anemia: Secondary | ICD-10-CM

## 2019-11-27 DIAGNOSIS — D631 Anemia in chronic kidney disease: Secondary | ICD-10-CM

## 2019-11-27 DIAGNOSIS — N182 Chronic kidney disease, stage 2 (mild): Secondary | ICD-10-CM

## 2019-11-27 LAB — BASIC METABOLIC PANEL
Anion gap: 10 (ref 5–15)
BUN: 13 mg/dL (ref 8–23)
CO2: 26 mmol/L (ref 22–32)
Calcium: 8.1 mg/dL — ABNORMAL LOW (ref 8.9–10.3)
Chloride: 102 mmol/L (ref 98–111)
Creatinine, Ser: 0.7 mg/dL (ref 0.44–1.00)
GFR calc Af Amer: >60 mL/min (ref >60)
GFR calc non Af Amer: >60 mL/min (ref >60)
Glucose, Bld: 133 mg/dL — ABNORMAL HIGH (ref 70–99)
Potassium: 3.5 mmol/L (ref 3.5–5.1)
Sodium: 138 mmol/L (ref 135–145)

## 2019-11-27 LAB — CBC
HCT: 23.5 % — ABNORMAL LOW (ref 36.0–46.0)
Hemoglobin: 7.6 g/dL — ABNORMAL LOW (ref 12.0–15.0)
MCH: 30.6 pg (ref 26.0–34.0)
MCHC: 32.3 g/dL (ref 30.0–36.0)
MCV: 94.8 fL (ref 80.0–100.0)
Platelets: 141 10*3/uL — ABNORMAL LOW (ref 150–400)
RBC: 2.48 MIL/uL — ABNORMAL LOW (ref 3.87–5.11)
RDW: 12.4 % (ref 11.5–15.5)
WBC: 7.3 10*3/uL (ref 4.0–10.5)
nRBC: 0 % (ref 0.0–0.2)

## 2019-11-27 MED ORDER — ADULT MULTIVITAMIN W/MINERALS CH
1.0000 | ORAL_TABLET | Freq: Every day | ORAL | Status: DC
  Administered 2019-11-27 – 2019-11-29 (×3): 1 via ORAL
  Filled 2019-11-27 (×3): qty 1

## 2019-11-27 NOTE — Progress Notes (Signed)
Subjective: 2 Days Post-Op Procedure(s) (LRB): INTRAMEDULLARY (IM) NAIL INTERTROCHANTRIC (Right) Patient reports pain as mild.    Objective: Vital signs in last 24 hours: Temp:  [98.2 F (36.8 C)-99.7 F (37.6 C)] 98.2 F (36.8 C) (03/15 0356) Pulse Rate:  [80-99] 93 (03/15 0356) Resp:  [17-18] 18 (03/15 0356) BP: (115-141)/(55-72) 141/65 (03/15 0356) SpO2:  [99 %-100 %] 100 % (03/15 0356)  Intake/Output from previous day: 03/14 0701 - 03/15 0700 In: 794.1 [P.O.:480; I.V.:214.1; IV Piggyback:100] Out: 2540 [Urine:2540] Intake/Output this shift: No intake/output data recorded.  Recent Labs    11/25/19 1136 11/26/19 0339 11/27/19 0254  HGB 10.3* 8.4* 7.6*   Recent Labs    11/26/19 0339 11/27/19 0254  WBC 6.6 7.3  RBC 2.72* 2.48*  HCT 26.3* 23.5*  PLT 133* 141*   Recent Labs    11/26/19 0339 11/27/19 0254  NA 138 138  K 3.9 3.5  CL 102 102  CO2 27 26  BUN 20 13  CREATININE 0.67 0.70  GLUCOSE 131* 133*  CALCIUM 8.2* 8.1*   Recent Labs    11/25/19 1136  INR 1.0    Neurovascular intact Sensation intact distally Intact pulses distally Dorsiflexion/Plantar flexion intact Incision: dressing C/D/I No cellulitis present Compartment soft   Assessment/Plan: 2 Days Post-Op Procedure(s) (LRB): INTRAMEDULLARY (IM) NAIL INTERTROCHANTRIC (Right) Up with therapy  WBAT RLE ABLA- mild and stable Lovenox for dvt ppx F/u with Dr. Erlinda Hong 2 weeks post-op      Aundra Dubin 11/27/2019, 7:08 AM

## 2019-11-27 NOTE — Progress Notes (Signed)
Initial Nutrition Assessment  RD working remotely.  DOCUMENTATION CODES:   Not applicable  INTERVENTION:   -MVI with minerals daily -Magic cup BID with meals, each supplement provides 290 kcal and 9 grams of protein  NUTRITION DIAGNOSIS:   Increased nutrient needs related to post-op healing as evidenced by estimated needs.  GOAL:   Patient will meet greater than or equal to 90% of their needs  MONITOR:   PO intake, Supplement acceptance, Labs, Weight trends, Skin, I & O's  REASON FOR ASSESSMENT:   Consult Assessment of nutrition requirement/status  ASSESSMENT:   Nicole Hess is a 74 y.o. female who presents with right intertroch s/p fall at [facility].  Level 5 caveat for dementia.  HPI obtained from [place], [name].  Patient is ambulatory at baseline without any devices.  Not on any anticoagulation.  S/p aortic valve replacement in 2016 by Dr. Cyndia Bent.  Has not had any issues since.  Ortho consulted for right hip fx.  Pt admitted with rt hip fracture.   3/13- s/p PROCEDURE: Open treatment of intertrochanteric fracture with intramedullary implant. CPT [phone]   Reviewed I/O's: -1.7L x 24 hours and -370 ml since admission  UOP: 2.5 L x 24 hours  Attempted to speak with pt via phone, however, no answer. Pt resided in SNF [facility]) PTA. Pt with dementia and unlikely to provide reliable history.   Pt with good meal completion; noted documented PO's 50-100%.   Reviewed wt hx; wt has been stable over the past year.   Medications reviewed and include calcium with vitamin D, colace, lasix, and senna.   Therapy recommending rehab at SNF.   Labs reviewed.   Diet Order:   Diet Order            Diet Heart Room service appropriate? Yes; Fluid consistency: Thin  Diet effective now              EDUCATION NEEDS:   No education needs have been identified at this time  Skin:  Skin Assessment: Skin Integrity Issues: Skin Integrity Issues::  Incisions Incisions: closed rt hip  Last BM:  Unknown  Height:   Ht Readings from Last 1 Encounters:  08/26/19 5\' 6"  (1.676 m)    Weight:   Wt Readings from Last 1 Encounters:  11/25/19 65 kg    Ideal Body Weight:  59.1 kg  BMI:  Body mass index is 23.13 kg/m.  Estimated Nutritional Needs:   Kcal:  [id number]  Protein:  85-100 grams  Fluid:  > 1.7 L    Loistine Chance, RD, LDN, [place] Registered Dietitian II Certified Diabetes Care and Education Specialist Please refer to [facility] for RD and/or RD on-call/weekend/after hours pager

## 2019-11-27 NOTE — Progress Notes (Signed)
Spoke with Arabella Merles (POA) and updated her on plan of care. The hope is for pt to return to previous memory care SNF, [facility], per POA. All questions answered; will continue to monitor.

## 2019-11-27 NOTE — Plan of Care (Signed)
  Problem: Nutrition: Goal: Adequate nutrition will be maintained Outcome: Progressing   Problem: Pain Managment: Goal: General experience of comfort will improve Outcome: Progressing   

## 2019-11-27 NOTE — Plan of Care (Signed)
  Problem: Clinical Measurements: Goal: Ability to maintain clinical measurements within normal limits will improve Outcome: Progressing   Problem: Nutrition: Goal: Adequate nutrition will be maintained Outcome: Not Progressing   Problem: Clinical Measurements: Goal: Cardiovascular complication will be avoided Outcome: Completed/Met

## 2019-11-27 NOTE — Plan of Care (Signed)
  Problem: Clinical Measurements: Goal: Ability to maintain clinical measurements within normal limits will improve Outcome: Progressing   Problem: Nutrition: Goal: Adequate nutrition will be maintained Outcome: Progressing   Problem: Coping: Goal: Level of anxiety will decrease Outcome: Progressing   Problem: Education: Goal: Knowledge of General Education information will improve Description: Including pain rating scale, medication(s)/side effects and non-pharmacologic comfort measures Outcome: Not Progressing

## 2019-11-27 NOTE — Progress Notes (Signed)
Physical Therapy Treatment Patient Details Name: Nicole Hess MRN: [id number] DOB: [birth year] Today's Date: 11/27/2019    History of Present Illness 74 yo female admitted with R hip fx after falling at her facility. S/P IM nail 11/25/19. Hx of dementia, gout, CAD, aortic stenosis    PT Comments    Pt attempting to remove SCD and purewick on arrival. Pt not oriented but pleasant. Pt able to inadvertently advise of need to toilet with pt able to walk to toilet with assist and void at toilet. Pt with mod assist for gait with periods of max assist when turning or backing to surface. Max assist for pericare as pt not following commands for pericare and wiping knee or forehead. Pt with improved mobility and 24hr assist needed for D/C.     Follow Up Recommendations  SNF;Supervision/Assistance - 24 hour     Equipment Recommendations  None recommended by PT    Recommendations for Other Services       Precautions / Restrictions Precautions Precautions: Fall Restrictions Weight Bearing Restrictions: No RLE Weight Bearing: Weight bearing as tolerated    Mobility  Bed Mobility Overal bed mobility: Needs Assistance Bed Mobility: Supine to Sit     Supine to sit: Min assist;HOB elevated     General bed mobility comments: HOB 30 degrees with increased time, assist and multimodal cues to initiate and min assist to fully elevate trunk  Transfers Overall transfer level: Needs assistance   Transfers: Sit to/from Stand Sit to Stand: Min assist         General transfer comment: cues for sequence, hand placement and assist to rise with increased time to initiate and translate anteriorly from bed and toilet. Mod assist to control backing and sitting to surface  Ambulation/Gait Ambulation/Gait assistance: Mod assist Gait Distance (Feet): 15 Feet Assistive device: Rolling walker (2 wheeled) Gait Pattern/deviations: Step-to pattern;Decreased stride length;Trunk flexed   Gait velocity  interpretation: <1.8 ft/sec, indicate of risk for recurrent falls General Gait Details: pt performs well with anterior translation with RW with generally min assist, attempting to turn and back to surfaces requires mod-max assist and max multimodal cues   Stairs             Wheelchair Mobility    Modified Rankin (Stroke Patients Only)       Balance Overall balance assessment: Needs assistance;History of Falls   Sitting balance-Leahy Scale: Fair     Standing balance support: Bilateral upper extremity supported Standing balance-Leahy Scale: Poor                              Cognition Arousal/Alertness: Awake/alert Behavior During Therapy: Flat affect Overall Cognitive Status: No family/caregiver present to determine baseline cognitive functioning                                 General Comments: pt follows single step commands with increased time, not oriented to place, time or situation      Exercises      General Comments        Pertinent Vitals/Pain Pain Assessment: No/denies pain    Home Living Family/patient expects to be discharged to:: Assisted living                    Prior Function            PT Goals (current goals  can now be found in the care plan section) Progress towards PT goals: Progressing toward goals    Frequency    Min 3X/week      PT Plan Current plan remains appropriate    Co-evaluation              AM-PAC PT "6 Clicks" Mobility   Outcome Measure  Help needed turning from your back to your side while in a flat bed without using bedrails?: A Little Help needed moving from lying on your back to sitting on the side of a flat bed without using bedrails?: A Little Help needed moving to and from a bed to a chair (including a wheelchair)?: A Lot Help needed standing up from a chair using your arms (e.g., wheelchair or bedside chair)?: A Lot Help needed to walk in hospital room?: A  Lot Help needed climbing 3-5 steps with a railing? : Total 6 Click Score: 13    End of Session Equipment Utilized During Treatment: Gait belt Activity Tolerance: Patient tolerated treatment well Patient left: in chair;with call bell/phone within reach;with chair alarm set;with nursing/sitter in room Nurse Communication: Mobility status;Precautions;Weight bearing status PT Visit Diagnosis: History of falling (Z91.81);Muscle weakness (generalized) (M62.81);Difficulty in walking, not elsewhere classified (R26.2);Other abnormalities of gait and mobility (R26.89)     Time: [id number] PT Time Calculation (min) (ACUTE ONLY): 28 min  Charges:  $Gait Training: 8-22 mins $Therapeutic Activity: 8-22 mins                     Keshan Reha P, PT Acute Rehabilitation Services Pager: [phone] Office: [place] 11/27/2019, 1:04 PM

## 2019-11-27 NOTE — Progress Notes (Signed)
PROGRESS NOTE    Nicole Hess  [id number] DOB: 12-17-1945 DOA: 11/25/2019 PCP: Reymundo Poll, MD      Brief Narrative:  Nicole Hess is a 74 y.o. F with dementia, SNF dwelling, bicuspid aortic valve s/p bioprosthetic replacement 2016, and CAD no recent PCI who presented with fall and hip pain.  In the ER, found to have RIGHT intertrochanteric hip fracture.  To OR with Dr. Erlinda Hong.      Assessment & Plan:  Right intertrochanteric hip fracture S/p ORIF right hip by Dr. Erlinda Hong on 3/13 -Continue scheduled acetaminophen -Continue as needed oxycodone -Continue Lovenox for DVT prophylaxis -Continue calcium and vitamin D -Hold home Lasix  Dementia -Continue memantine  Acute blood loss anemia on anemia of chronic disease Expected blood loss postoperatively.  Transfusion threshold 7 g/dL.  Hgb slightly down, no clinical bleeding.  Likely will need transfusion tomorrow.  History of aortic valve repair  Essential tremor -Continue primidone  Coronary disease -Hold Lasix -Continue baby aspirin        Disposition: The patient was admitted with a hip fracture.  Her surgery was uncomplicated, and she has had some expected postoperative bleeding, may need transfusion tomorrow.  However at baseline she is ambulatory, will require significant rehabilitation to return to her prior independent level of function.  Postoperative day 2, anticipate discharge to skilled tomorrow.           MDM: The below labs and imaging reports reviewed and summarized above.  Medication management as above.     DVT prophylaxis: Lovenox Code Status: DNR Family Communication: Will call son by phone    Consultants:   Orthopedics  Procedures:   3/13 intramedullary implant/ORIF right hip  Antimicrobials:      Culture data:              Subjective: The patient's pain is well controlled.  She is at baseline mentation.  She has had no change in agitation, lightheadedness,  dizziness, foot weakness, fatigue, shortness of breath.     Objective: Vitals:   11/26/19 1500 11/26/19 2000 11/27/19 0356 11/27/19 0807  BP: 116/72 138/67 (!) 141/65 (!) 101/48  Pulse: 80 99 93 92  Resp: 17  18 18   Temp: 98.3 F (36.8 C) 99.7 F (37.6 C) 98.2 F (36.8 C) 99.2 F (37.3 C)  TempSrc: Oral Oral Oral Oral  SpO2: 99% 100% 100% 98%  Weight:        Intake/Output Summary (Last 24 hours) at 11/27/2019 1034 Last data filed at 11/27/2019 0900 Gross per 24 hour  Intake 600 ml  Output 1500 ml  Net -900 ml   Filed Weights   11/25/19 1858  Weight: 65 kg    Examination: General appearance: Thin elderly adult female, alert and in no acute distress.  Sitting up in bed, attentive. HEENT: Anicteric, conjunctiva pink, lids and lashes normal. No nasal deformity, discharge, epistaxis.  Lips moist, dentition in good repair, oropharynx moist, no oral lesions, hearing slightly diminished.     Skin: Warm and dry.  No suspicious rashes or lesions.  Wound is clean dry and intact, no blood on the dressing. Cardiac: RRR, no murmurs appreciated.  JVP normal.  No LE edema.    Respiratory: Normal respiratory rate and rhythm.  CTAB without rales or wheezes. Abdomen: Abdomen soft.  No tenderness to palpation or guarding. No ascites, distension, hepatosplenomegaly.   MSK: No deformities or effusions of the large joints of the upper or lower extremities bilaterally.  Mild loss of subcutaneous  muscle mass and fat. Neuro: Awake and alert.  Muscle tone is diminished, she has a moderate resting tremor, moves upper extremities with generalized weakness.  Slight discoordination.  Speech fluent.  Memory impairments is severe.      Psych: Sensorium intact and responding to questions, attention diminished. Affect pleasant.  Judgment and insight appear severely impaired.         Data Reviewed: I have personally reviewed following labs and imaging studies:  CBC: Recent Labs  Lab 11/25/19 1136  11/26/19 0339 11/27/19 0254  WBC 5.2 6.6 7.3  NEUTROABS 4.2  --   --   HGB 10.3* 8.4* 7.6*  HCT 33.0* 26.3* 23.5*  MCV 98.5 96.7 94.8  PLT 161 133* [id number]*   Basic Metabolic Panel: Recent Labs  Lab 11/25/19 1136 11/26/19 0339 11/27/19 0254  NA 138 138 138  K 4.0 3.9 3.5  CL 101 102 102  CO2 24 27 26   GLUCOSE 135* 131* 133*  BUN 35* 20 13  CREATININE 1.09* 0.67 0.70  CALCIUM 8.4* 8.2* 8.1*   GFR: Estimated Creatinine Clearance: 60.4 mL/min (by C-G formula based on SCr of 0.7 mg/dL). Liver Function Tests: No results for input(s): AST, ALT, ALKPHOS, BILITOT, PROT, ALBUMIN in the last 168 hours. No results for input(s): LIPASE, AMYLASE in the last 168 hours. No results for input(s): AMMONIA in the last 168 hours. Coagulation Profile: Recent Labs  Lab 11/25/19 1136  INR 1.0   Cardiac Enzymes: No results for input(s): CKTOTAL, CKMB, CKMBINDEX, TROPONINI in the last 168 hours. BNP (last 3 results) No results for input(s): PROBNP in the last 8760 hours. HbA1C: No results for input(s): HGBA1C in the last 72 hours. CBG: No results for input(s): GLUCAP in the last 168 hours. Lipid Profile: No results for input(s): CHOL, HDL, LDLCALC, TRIG, CHOLHDL, LDLDIRECT in the last 72 hours. Thyroid Function Tests: No results for input(s): TSH, T4TOTAL, FREET4, T3FREE, THYROIDAB in the last 72 hours. Anemia Panel: Recent Labs    11/25/19 1649  FERRITIN 63  TIBC 245*  IRON 13*  RETICCTPCT 0.9   Urine analysis:    Component Value Date/Time   COLORURINE YELLOW 08/23/2019 2246   APPEARANCEUR CLEAR 08/23/2019 2246   LABSPEC 1.036 (H) 08/23/2019 2246   PHURINE 5.0 08/23/2019 2246   GLUCOSEU NEGATIVE 08/23/2019 2246   GLUCOSEU NEGATIVE 11/06/2016 1022   HGBUR NEGATIVE 08/23/2019 2246   BILIRUBINUR NEGATIVE 08/23/2019 2246   BILIRUBINUR N 07/04/2019 1119   KETONESUR 5 (A) 08/23/2019 2246   PROTEINUR NEGATIVE 08/23/2019 2246   UROBILINOGEN 0.2 07/04/2019 1119   UROBILINOGEN 0.2  05/08/2018 1732   NITRITE NEGATIVE 08/23/2019 2246   LEUKOCYTESUR TRACE (A) 08/23/2019 2246   Sepsis Labs: @LABRCNTIP (procalcitonin:4,lacticacidven:4)  ) Recent Results (from the past 240 hour(s))  Respiratory Panel by RT PCR (Flu A&B, Covid) - Nasopharyngeal Swab     Status: None   Collection Time: 11/25/19 11:36 AM   Specimen: Nasopharyngeal Swab  Result Value Ref Range Status   SARS Coronavirus 2 by RT PCR NEGATIVE NEGATIVE Final    Comment: (NOTE) SARS-CoV-2 target nucleic acids are NOT DETECTED. The SARS-CoV-2 RNA is generally detectable in upper respiratoy specimens during the acute phase of infection. The lowest concentration of SARS-CoV-2 viral copies this assay can detect is 131 copies/mL. A negative result does not preclude SARS-Cov-2 infection and should not be used as the sole basis for treatment or other patient management decisions. A negative result may occur with  improper specimen collection/handling, submission of specimen other  than nasopharyngeal swab, presence of viral mutation(s) within the areas targeted by this assay, and inadequate number of viral copies (<131 copies/mL). A negative result must be combined with clinical observations, patient history, and epidemiological information. The expected result is Negative. Fact Sheet for Patients:  PinkCheek.be Fact Sheet for Healthcare Providers:  GravelBags.it This test is not yet ap proved or cleared by the [place] FDA and  has been authorized for detection and/or diagnosis of SARS-CoV-2 by FDA under an Emergency Use Authorization (EUA). This EUA will remain  in effect (meaning this test can be used) for the duration of the COVID-19 declaration under Section 564(b)(1) of the Act, 21 U.S.C. section 360bbb-3(b)(1), unless the authorization is terminated or revoked sooner.    Influenza A by PCR NEGATIVE NEGATIVE Final   Influenza B by PCR NEGATIVE  NEGATIVE Final    Comment: (NOTE) The Xpert Xpress SARS-CoV-2/FLU/RSV assay is intended as an aid in  the diagnosis of influenza from Nasopharyngeal swab specimens and  should not be used as a sole basis for treatment. Nasal washings and  aspirates are unacceptable for Xpert Xpress SARS-CoV-2/FLU/RSV  testing. Fact Sheet for Patients: PinkCheek.be Fact Sheet for Healthcare Providers: GravelBags.it This test is not yet approved or cleared by the [place] FDA and  has been authorized for detection and/or diagnosis of SARS-CoV-2 by  FDA under an Emergency Use Authorization (EUA). This EUA will remain  in effect (meaning this test can be used) for the duration of the  Covid-19 declaration under Section 564(b)(1) of the Act, 21  U.S.C. section 360bbb-3(b)(1), unless the authorization is  terminated or revoked. Performed at [facility], [place] [address], [place],  [address]          Radiology Studies: DG Ankle Complete Right  Result Date: 11/25/2019 CLINICAL DATA:  Fall EXAM: RIGHT ANKLE - COMPLETE 3+ VIEW COMPARISON:  None. FINDINGS: Osteopenia. There is no evidence of fracture, dislocation, or joint effusion. There is no evidence of arthropathy or other focal bone abnormality. Diffuse soft tissue edema. IMPRESSION: 1.  No fracture or dislocation of the right ankle. 2.  Osteopenia. 3.  Diffuse soft tissue edema. Electronically Signed   By: Eddie Candle M.D.   On: 11/25/2019 12:29   CT HEAD WO CONTRAST  Result Date: 11/25/2019 CLINICAL DATA:  Fall, head trauma EXAM: CT HEAD WITHOUT CONTRAST CT CERVICAL SPINE WITHOUT CONTRAST TECHNIQUE: Multidetector CT imaging of the head and cervical spine was performed following the standard protocol without intravenous contrast. Multiplanar CT image reconstructions of the cervical spine were also generated. COMPARISON:  07/25/2019 FINDINGS: CT HEAD FINDINGS Brain: No evidence of  acute infarction, hemorrhage, hydrocephalus, extra-axial collection or mass lesion/mass effect. Mild subcortical white matter and periventricular small vessel ischemic changes. Vascular: Mild intracranial atherosclerosis. Skull: Normal. Negative for fracture or focal lesion. Sinuses/Orbits: Mild mucosal thickening in the bilateral maxillary sinuses. Minimal partial opacification of the right ethmoid air cells. Visualized paranasal sinuses and mastoid air cells are otherwise clear. Other: None. CT CERVICAL SPINE FINDINGS Alignment: Normal cervical lordosis. Skull base and vertebrae: No acute fracture. No primary bone lesion or focal pathologic process. Soft tissues and spinal canal: No prevertebral fluid or swelling. No visible canal hematoma. Disc levels: Mild degenerative changes of the mid/lower cervical spine. Spinal canal is patent. Upper chest: Visualized lung apices are clear. Other: Visualized thyroid is unremarkable. IMPRESSION: No evidence of acute intracranial abnormality. Mild small vessel ischemic changes. No evidence of traumatic injury to the cervical spine.  Mild degenerative changes. Electronically Signed   By: Julian Hy M.D.   On: 11/25/2019 12:13   CT CERVICAL SPINE WO CONTRAST  Result Date: 11/25/2019 CLINICAL DATA:  Fall, head trauma EXAM: CT HEAD WITHOUT CONTRAST CT CERVICAL SPINE WITHOUT CONTRAST TECHNIQUE: Multidetector CT imaging of the head and cervical spine was performed following the standard protocol without intravenous contrast. Multiplanar CT image reconstructions of the cervical spine were also generated. COMPARISON:  07/25/2019 FINDINGS: CT HEAD FINDINGS Brain: No evidence of acute infarction, hemorrhage, hydrocephalus, extra-axial collection or mass lesion/mass effect. Mild subcortical white matter and periventricular small vessel ischemic changes. Vascular: Mild intracranial atherosclerosis. Skull: Normal. Negative for fracture or focal lesion. Sinuses/Orbits: Mild  mucosal thickening in the bilateral maxillary sinuses. Minimal partial opacification of the right ethmoid air cells. Visualized paranasal sinuses and mastoid air cells are otherwise clear. Other: None. CT CERVICAL SPINE FINDINGS Alignment: Normal cervical lordosis. Skull base and vertebrae: No acute fracture. No primary bone lesion or focal pathologic process. Soft tissues and spinal canal: No prevertebral fluid or swelling. No visible canal hematoma. Disc levels: Mild degenerative changes of the mid/lower cervical spine. Spinal canal is patent. Upper chest: Visualized lung apices are clear. Other: Visualized thyroid is unremarkable. IMPRESSION: No evidence of acute intracranial abnormality. Mild small vessel ischemic changes. No evidence of traumatic injury to the cervical spine. Mild degenerative changes. Electronically Signed   By: Julian Hy M.D.   On: 11/25/2019 12:13   DG Chest Port 1 View  Result Date: 11/25/2019 CLINICAL DATA:  Hip fracture. EXAM: PORTABLE CHEST 1 VIEW COMPARISON:  08/23/2019 FINDINGS: Stable changes from prior cardiac surgery and valve replacement. With cardiac silhouette is normal in size. No mediastinal or hilar masses. Clear lungs.  No pleural effusion or pneumothorax. Skeletal structures are grossly intact. IMPRESSION: No acute cardiopulmonary disease. Electronically Signed   By: Lajean Manes M.D.   On: 11/25/2019 13:34   DG C-Arm 1-60 Min  Result Date: 11/25/2019 CLINICAL DATA:  Imaging provided for ORIF for a right proximal femur fracture. EXAM: DG C-ARM 1-60 MIN; RIGHT FEMUR 2 VIEWS COMPARISON:  11/25/2019 at 12:02 p.m. FINDINGS: Submitted images show placement an intramedullary rod supporting 2 compression screws, reducing the primary intertrochanteric fracture components into near anatomic alignment. The orthopedic hardware appears well seated. IMPRESSION: Well aligned proximal right femur fracture following ORIF. Electronically Signed   By: Lajean Manes M.D.   On:  11/25/2019 17:18   DG Hip Unilat With Pelvis 2-3 Views Right  Result Date: 11/25/2019 CLINICAL DATA:  Fall, pain and deformity EXAM: DG HIP (WITH OR WITHOUT PELVIS) 2-3V RIGHT COMPARISON:  None. FINDINGS: There is a comminuted, displaced intratrochanteric fracture of the right hip with displaced fragments of the greater and lesser trochanters. No other fracture or dislocation of the pelvis or proximal left femur as included. Osteopenia. Large burden of stool and stool balls in the colon and rectum. IMPRESSION: Comminuted, displaced intratrochanteric fracture of the right hip. Electronically Signed   By: Eddie Candle M.D.   On: 11/25/2019 12:27   DG FEMUR, MIN 2 VIEWS RIGHT  Result Date: 11/25/2019 CLINICAL DATA:  Imaging provided for ORIF for a right proximal femur fracture. EXAM: DG C-ARM 1-60 MIN; RIGHT FEMUR 2 VIEWS COMPARISON:  11/25/2019 at 12:02 p.m. FINDINGS: Submitted images show placement an intramedullary rod supporting 2 compression screws, reducing the primary intertrochanteric fracture components into near anatomic alignment. The orthopedic hardware appears well seated. IMPRESSION: Well aligned proximal right femur fracture following ORIF. Electronically Signed  By: Lajean Manes M.D.   On: 11/25/2019 17:18        Scheduled Meds: . aspirin  81 mg Oral Daily  . calcium-vitamin D  1 tablet Oral TID  . docusate sodium  100 mg Oral BID  . enoxaparin (LOVENOX) injection  40 mg Subcutaneous Q24H  . estradiol  1 Applicatorful Vaginal Q Mon  . furosemide  20 mg Oral Daily  . memantine  10 mg Oral BID  . primidone  250 mg Oral BID  . senna-docusate  1 tablet Oral Q M,W,F-1800   Continuous Infusions: . sodium chloride Stopped (11/26/19 0921)  . lactated ringers    . methocarbamol (ROBAXIN) IV       LOS: 2 days    Time spent: 25 minutes    Edwin Dada, MD [facility] 11/27/2019, 10:34 AM     Please page though [place] or Epic secure chat:  For [employer], Adult nurse

## 2019-11-28 ENCOUNTER — Encounter: Payer: Self-pay | Admitting: *Deleted

## 2019-11-28 LAB — BASIC METABOLIC PANEL
Anion gap: 11 (ref 5–15)
BUN: 19 mg/dL (ref 8–23)
CO2: 28 mmol/L (ref 22–32)
Calcium: 8.1 mg/dL — ABNORMAL LOW (ref 8.9–10.3)
Chloride: 100 mmol/L (ref 98–111)
Creatinine, Ser: 0.69 mg/dL (ref 0.44–1.00)
GFR calc Af Amer: >60 mL/min (ref >60)
GFR calc non Af Amer: >60 mL/min (ref >60)
Glucose, Bld: 108 mg/dL — ABNORMAL HIGH (ref 70–99)
Potassium: 3.4 mmol/L — ABNORMAL LOW (ref 3.5–5.1)
Sodium: 139 mmol/L (ref 135–145)

## 2019-11-28 LAB — CBC
HCT: 22.5 % — ABNORMAL LOW (ref 36.0–46.0)
Hemoglobin: 7.2 g/dL — ABNORMAL LOW (ref 12.0–15.0)
MCH: 31.2 pg (ref 26.0–34.0)
MCHC: 32 g/dL (ref 30.0–36.0)
MCV: 97.4 fL (ref 80.0–100.0)
Platelets: 177 10*3/uL (ref 150–400)
RBC: 2.31 MIL/uL — ABNORMAL LOW (ref 3.87–5.11)
RDW: 12.6 % (ref 11.5–15.5)
WBC: 5.7 10*3/uL (ref 4.0–10.5)
nRBC: 0 % (ref 0.0–0.2)

## 2019-11-28 LAB — SARS CORONAVIRUS 2 (TAT 6-24 HRS): SARS Coronavirus 2: NEGATIVE

## 2019-11-28 LAB — PREPARE RBC (CROSSMATCH)

## 2019-11-28 MED ORDER — SODIUM CHLORIDE 0.9 % IV SOLN
510.0000 mg | Freq: Once | INTRAVENOUS | Status: DC
  Filled 2019-11-28: qty 17

## 2019-11-28 MED ORDER — SODIUM CHLORIDE 0.9% IV SOLUTION: Freq: Once | INTRAVENOUS | Status: AC

## 2019-11-28 NOTE — Progress Notes (Signed)
Nutrition Follow-up  DOCUMENTATION CODES:   Not applicable  INTERVENTION:   -Continue MVI with minerals daily -Continue Magic cup BID with meals, each supplement provides 290 kcal and 9 grams of protein  NUTRITION DIAGNOSIS:   Increased nutrient needs related to post-op healing as evidenced by estimated needs.  Ongoing  GOAL:   Patient will meet greater than or equal to 90% of their needs  Progressing   MONITOR:   PO intake, Supplement acceptance, Labs, Weight trends, Skin, I & O's  REASON FOR ASSESSMENT:   Consult Assessment of nutrition requirement/status  ASSESSMENT:   Nicole Hess is a 74 y.o. female who presents with right intertroch s/p fall at [facility].  Level 5 caveat for dementia.  HPI obtained from [place], [name].  Patient is ambulatory at baseline without any devices.  Not on any anticoagulation.  S/p aortic valve replacement in 2016 by Dr. Cyndia Bent.  Has not had any issues since.  Ortho consulted for right hip fx.  3/13- s/p PROCEDURE: Open treatment of intertrochanteric fracture with intramedullary implant. CPT [phone]   Reviewed I/O': +840 ml x 24 hours and+470 ml since admission  Pt asleep, snoring audibly at time of visit. She did not respond to voice or touch.   Pt remains with good appetite; noted meal completion 25-100%.   Per MD notes, plan to return to [facility] once medically stable.    Labs reviewed: K: 3.4.   NUTRITION - FOCUSED PHYSICAL EXAM:    Most Recent Value  Orbital Region  Mild depletion  Upper Arm Region  No depletion  Thoracic and Lumbar Region  No depletion  Buccal Region  No depletion  Temple Region  No depletion  Clavicle Bone Region  Mild depletion  Clavicle and Acromion Bone Region  No depletion  Scapular Bone Region  No depletion  Dorsal Hand  No depletion  Patellar Region  No depletion  Anterior Thigh Region  No depletion  Edema (RD Assessment)  Mild  Hair  Reviewed  Eyes  Reviewed  Mouth   Reviewed  Skin  Reviewed  Nails  Reviewed       Diet Order:   Diet Order            Diet Heart Room service appropriate? Yes; Fluid consistency: Thin  Diet effective now              EDUCATION NEEDS:   No education needs have been identified at this time  Skin:  Skin Assessment: Skin Integrity Issues: Skin Integrity Issues:: Incisions Incisions: closed rt hip  Last BM:  Unknown  Height:   Ht Readings from Last 1 Encounters:  11/27/19 5\' 7"  (1.702 m)    Weight:   Wt Readings from Last 1 Encounters:  11/27/19 65 kg    Ideal Body Weight:  59.1 kg  BMI:  Body mass index is 22.44 kg/m.  Estimated Nutritional Needs:   Kcal:  [id number]  Protein:  85-100 grams  Fluid:  > 1.7 L    Loistine Chance, RD, LDN, [place] Registered Dietitian II Certified Diabetes Care and Education Specialist Please refer to [facility] for RD and/or RD on-call/weekend/after hours pager

## 2019-11-28 NOTE — Progress Notes (Signed)
PROGRESS NOTE    Nicole Hess  [id number] DOB: June 01, 1946 DOA: 11/25/2019 PCP: Reymundo Poll, MD      Brief Narrative:  Mrs. Search is a 74 y.o. F with dementia, SNF dwelling, bicuspid aortic valve s/p bioprosthetic replacement 2016, and CAD no recent PCI who presented with fall and hip pain.  In the ER, found to have RIGHT intertrochanteric hip fracture.  To OR with Dr. Erlinda Hong.      Assessment & Plan:  Right intertrochanteric hip fracture S/p ORIF right hip by Dr. Erlinda Hong on 3/13 -Continue scheduled acetaminophen -Continue as needed oxycodone -Continue Lovenox for DVT prophylaxis -Continue calcium and vitamin D -Hold home Lasix    Dementia -Continue memantine  Acute blood loss anemia on anemia of chronic disease Expected blood loss postoperatively.  Hgb trending further down.  Iron studies on admission with exeremely low iron sat.   -Transfuse 1 unit now -Feraheme ordered  History of aortic valve repair  Essential tremor -Continue primidone  Coronary disease -Resume Lasix -Continue baby aspirin        Disposition: The patient was admitted with a hip fracture.  Her surgery was uncomplicated, and she has had some expected postoperative bleeding, requiring transfusion.    However at baseline she is ambulatory, will require significant rehabilitation to return to her prior independent level of function.  Postoperative day 3, insurance authorization pending, we anticipate discharge to skilled tomorrow.           MDM: The below labs and imaging reports reviewed and summarized above.  Medication management as above.     DVT prophylaxis: Lovenox Code Status: DNR Family Communication:      Consultants:   Orthopedics  Procedures:   3/13 intramedullary implant/ORIF right hip  Antimicrobials:      Culture data:              Subjective: Patient's pain is well controlled.  She is at baseline mentation.  She has had no agitation.  No  dyspnea, chest pain.  She is fatigued, pale, weak.   Objective: Vitals:   11/28/19 1039 11/28/19 1343 11/28/19 1345 11/28/19 1500  BP: (!) 106/51 (!) 107/43 (!) 107/43 (!) 110/51  Pulse: 90 84 84 82  Resp: 16 16 16 16   Temp: 98.1 F (36.7 C) 99.1 F (37.3 C) 99 F (37.2 C) 98.9 F (37.2 C)  TempSrc: Oral Oral Oral Oral  SpO2: 100%  100% 100%  Weight:      Height:        Intake/Output Summary (Last 24 hours) at 11/28/2019 1926 Last data filed at 11/28/2019 1343 Gross per 24 hour  Intake 805 ml  Output -  Net 805 ml   Filed Weights   11/25/19 1858 11/27/19 1146  Weight: 65 kg 65 kg    Examination: General appearance: Thin elderly adult female, alert and in no acute distress.  Sitting up in bed HEENT: Anicteric, conjunctiva pink, lids and lashes normal. No nasal deformity, discharge, epistaxis.  Lips moist, teeth normal. OP moist, no oral lesions.   Skin: Pale, Warm and dry.  No suspicious rashes or lesions. Cardiac: RRR, no murmurs appreciated.  No LE edema.    Respiratory: Normal respiratory rate and rhythm.  CTAB without rales or wheezes. Abdomen: Abdomen soft.  No tenderness palpation or guarding. No ascites, distension, hepatosplenomegaly.   MSK: No deformities or effusions of the large joints of the upper or lower extremities bilaterally. Neuro: Awake and alert.  Memory seems severely impaired.  Muscle  tone is diminished.  Moves upper extremities with normal strength and coordination, leg strength seems normal.  Speech fluent.    Psych: Sensorium intact and responding to questions, attention diminished. Affect pleasant.  Judgment and insight appear severely impaired, most of her speech is nonsensical.           Data Reviewed: I have personally reviewed following labs and imaging studies:  CBC: Recent Labs  Lab 11/25/19 1136 11/26/19 0339 11/27/19 0254 11/28/19 0425  WBC 5.2 6.6 7.3 5.7  NEUTROABS 4.2  --   --   --   HGB 10.3* 8.4* 7.6* 7.2*  HCT 33.0*  26.3* 23.5* 22.5*  MCV 98.5 96.7 94.8 97.4  PLT 161 133* 141* [id number]   Basic Metabolic Panel: Recent Labs  Lab 11/25/19 1136 11/26/19 0339 11/27/19 0254 11/28/19 0425  NA 138 138 138 139  K 4.0 3.9 3.5 3.4*  CL 101 102 102 100  CO2 24 27 26 28   GLUCOSE 135* 131* 133* 108*  BUN 35* 20 13 19   CREATININE 1.09* 0.67 0.70 0.69  CALCIUM 8.4* 8.2* 8.1* 8.1*   GFR: Estimated Creatinine Clearance: 62.7 mL/min (by C-G formula based on SCr of 0.69 mg/dL). Liver Function Tests: No results for input(s): AST, ALT, ALKPHOS, BILITOT, PROT, ALBUMIN in the last 168 hours. No results for input(s): LIPASE, AMYLASE in the last 168 hours. No results for input(s): AMMONIA in the last 168 hours. Coagulation Profile: Recent Labs  Lab 11/25/19 1136  INR 1.0   Cardiac Enzymes: No results for input(s): CKTOTAL, CKMB, CKMBINDEX, TROPONINI in the last 168 hours. BNP (last 3 results) No results for input(s): PROBNP in the last 8760 hours. HbA1C: No results for input(s): HGBA1C in the last 72 hours. CBG: No results for input(s): GLUCAP in the last 168 hours. Lipid Profile: No results for input(s): CHOL, HDL, LDLCALC, TRIG, CHOLHDL, LDLDIRECT in the last 72 hours. Thyroid Function Tests: No results for input(s): TSH, T4TOTAL, FREET4, T3FREE, THYROIDAB in the last 72 hours. Anemia Panel: No results for input(s): VITAMINB12, FOLATE, FERRITIN, TIBC, IRON, RETICCTPCT in the last 72 hours. Urine analysis:    Component Value Date/Time   COLORURINE YELLOW 08/23/2019 2246   APPEARANCEUR CLEAR 08/23/2019 2246   LABSPEC 1.036 (H) 08/23/2019 2246   PHURINE 5.0 08/23/2019 2246   GLUCOSEU NEGATIVE 08/23/2019 2246   GLUCOSEU NEGATIVE 11/06/2016 1022   HGBUR NEGATIVE 08/23/2019 2246   BILIRUBINUR NEGATIVE 08/23/2019 2246   BILIRUBINUR N 07/04/2019 1119   KETONESUR 5 (A) 08/23/2019 2246   PROTEINUR NEGATIVE 08/23/2019 2246   UROBILINOGEN 0.2 07/04/2019 1119   UROBILINOGEN 0.2 05/08/2018 1732   NITRITE  NEGATIVE 08/23/2019 2246   LEUKOCYTESUR TRACE (A) 08/23/2019 2246   Sepsis Labs: @LABRCNTIP (procalcitonin:4,lacticacidven:4)  ) Recent Results (from the past 240 hour(s))  Respiratory Panel by RT PCR (Flu A&B, Covid) - Nasopharyngeal Swab     Status: None   Collection Time: 11/25/19 11:36 AM   Specimen: Nasopharyngeal Swab  Result Value Ref Range Status   SARS Coronavirus 2 by RT PCR NEGATIVE NEGATIVE Final    Comment: (NOTE) SARS-CoV-2 target nucleic acids are NOT DETECTED. The SARS-CoV-2 RNA is generally detectable in upper respiratoy specimens during the acute phase of infection. The lowest concentration of SARS-CoV-2 viral copies this assay can detect is 131 copies/mL. A negative result does not preclude SARS-Cov-2 infection and should not be used as the sole basis for treatment or other patient management decisions. A negative result may occur with  improper specimen collection/handling, submission  of specimen other than nasopharyngeal swab, presence of viral mutation(s) within the areas targeted by this assay, and inadequate number of viral copies (<131 copies/mL). A negative result must be combined with clinical observations, patient history, and epidemiological information. The expected result is Negative. Fact Sheet for Patients:  PinkCheek.be Fact Sheet for Healthcare Providers:  GravelBags.it This test is not yet ap proved or cleared by the [place] FDA and  has been authorized for detection and/or diagnosis of SARS-CoV-2 by FDA under an Emergency Use Authorization (EUA). This EUA will remain  in effect (meaning this test can be used) for the duration of the COVID-19 declaration under Section 564(b)(1) of the Act, 21 U.S.C. section 360bbb-3(b)(1), unless the authorization is terminated or revoked sooner.    Influenza A by PCR NEGATIVE NEGATIVE Final   Influenza B by PCR NEGATIVE NEGATIVE Final     Comment: (NOTE) The Xpert Xpress SARS-CoV-2/FLU/RSV assay is intended as an aid in  the diagnosis of influenza from Nasopharyngeal swab specimens and  should not be used as a sole basis for treatment. Nasal washings and  aspirates are unacceptable for Xpert Xpress SARS-CoV-2/FLU/RSV  testing. Fact Sheet for Patients: PinkCheek.be Fact Sheet for Healthcare Providers: GravelBags.it This test is not yet approved or cleared by the [place] FDA and  has been authorized for detection and/or diagnosis of SARS-CoV-2 by  FDA under an Emergency Use Authorization (EUA). This EUA will remain  in effect (meaning this test can be used) for the duration of the  Covid-19 declaration under Section 564(b)(1) of the Act, 21  U.S.C. section 360bbb-3(b)(1), unless the authorization is  terminated or revoked. Performed at [facility], [place] [address], [place], [place] [address]          Radiology Studies: No results found.      Scheduled Meds: . aspirin  81 mg Oral Daily  . calcium-vitamin D  1 tablet Oral TID  . docusate sodium  100 mg Oral BID  . enoxaparin (LOVENOX) injection  40 mg Subcutaneous Q24H  . estradiol  1 Applicatorful Vaginal Q Mon  . furosemide  20 mg Oral Daily  . memantine  10 mg Oral BID  . multivitamin with minerals  1 tablet Oral Daily  . primidone  250 mg Oral BID  . senna-docusate  1 tablet Oral Q M,W,F-1800   Continuous Infusions: . sodium chloride Stopped (11/26/19 0921)  . ferumoxytol    . lactated ringers    . methocarbamol (ROBAXIN) IV       LOS: 3 days    Time spent: 25 minutes    Edwin Dada, MD [facility] 11/28/2019, 7:26 PM     Please page though [place] or Epic secure chat:  For [employer], Adult nurse

## 2019-11-28 NOTE — Progress Notes (Addendum)
NCM  received call from HTA with SNF approval  X 7 days,auth # [id number]. EMS transport, [phone]. Christinia Gully, UM.  Whitman Hero RN,BSM,CM [phone]

## 2019-11-28 NOTE — TOC Initial Note (Signed)
Transition of Care [facility]) - Initial/Assessment Note    Patient Details  Name: Nicole Hess MRN: [id number] Date of Birth: 08-08-1946  Transition of Care [facility]) CM/SW Contact:    Sharin Mons, RN Phone Number: 11/28/2019, 10:18 AM  Clinical Narrative:      Admitted from [facility]  After falling. Suffered R hip fx, s/p IM nail 11/25/2019. Hx of dementia, gout, CAD, aortic stenosis       RNCM received consult for possible SNF placement at time of discharge. RNCM spoke with patient's (friend/HCPOA)/ Marsena regarding PT recommendation of SNF placement at time of discharge. [name] understands that patient  needs higher level of care and pt's ALF/MEMORY CARE can't provide it for her patient at their home given patient's current physical needs and fall risk. Marsena expressed understanding of PT recommendation and is agreeable to SNF placement at time of discharge.  [facility]. RNCM discussed insurance authorization process and provided Medicare SNF ratings list. No further questions reported at this time. RNCM to continue to follow and assist with discharge planning needs.  Arabella Merles (Friend)/ HCPOA     [phone]        Expected Discharge Plan: Skilled Nursing Facility Barriers to Discharge: Insurance Authorization(bed offers pending,)   Patient Goals and CMS Choice        Expected Discharge Plan and Services Expected Discharge Plan: [place]     Prior Living Arrangements/Services      Activities of Daily Living Home Assistive Devices/Equipment: None ADL Screening (condition at time of admission) Patient's cognitive ability adequate to safely complete daily activities?: No Is the patient deaf or have difficulty hearing?: No Does the patient have difficulty seeing, even when wearing glasses/contacts?: No Does the patient have difficulty concentrating, remembering, or making decisions?: Yes Patient able to express need  for assistance with ADLs?: No Does the patient have difficulty dressing or bathing?: Yes Independently performs ADLs?: No Does the patient have difficulty walking or climbing stairs?: Yes Weakness of Legs: Both Weakness of Arms/Hands: None  Permission Sought/Granted                  Emotional Assessment              Admission diagnosis:  Fall, initial encounter [W19.XXXA] Closed fracture of right hip, initial encounter ([place]) [S72.001A] Laceration of scalp, initial encounter [S01.01XA] Closed right hip fracture, initial encounter ([place]) [S72.001A] Sprain of right ankle, unspecified ligament, initial encounter [S93.401A] Patient Active Problem List   Diagnosis Date Noted  . Hip fracture, right, closed, initial encounter ([place]) 11/25/2019  . Closed right hip fracture, initial encounter ([place]) 11/25/2019  . Displaced intertrochanteric fracture of right femur, initial encounter for closed fracture ([place])   . Hypocalcemia 08/23/2019  . COVID-19 virus infection 08/23/2019  . Hypokalemia 08/23/2019  . Overactive bladder 05/20/2018  . Seasonal allergic rhinitis due to pollen 05/20/2018  . Vertigo 04/29/2017  . Memory difficulty 04/29/2017  . Dementia without behavioral disturbance ([place]) 02/06/2017  . 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 History of colonic polyps 11/09/2016  . S/P AVR (aortic valve replacement) 04/02/2015  . Aortic stenosis due to bicuspid aortic valve 02/26/2015  . Severe aortic stenosis 05/02/2014  . Precordial pain 01/26/2013  . Depression 03/30/2012  . Hyperlipidemia 03/30/2012   PCP:  Reymundo Poll, MD Pharmacy:  No Pharmacies Listed    Social Determinants of Health (SDOH) Interventions    Readmission Risk Interventions No flowsheet data found.

## 2019-11-29 ENCOUNTER — Other Ambulatory Visit: Payer: Self-pay | Admitting: Adult Health

## 2019-11-29 DIAGNOSIS — D638 Anemia in other chronic diseases classified elsewhere: Secondary | ICD-10-CM | POA: Diagnosis not present

## 2019-11-29 DIAGNOSIS — I350 Nonrheumatic aortic (valve) stenosis: Secondary | ICD-10-CM | POA: Diagnosis not present

## 2019-11-29 DIAGNOSIS — Z743 Need for continuous supervision: Secondary | ICD-10-CM | POA: Diagnosis not present

## 2019-11-29 DIAGNOSIS — G40909 Epilepsy, unspecified, not intractable, without status epilepticus: Secondary | ICD-10-CM | POA: Diagnosis not present

## 2019-11-29 DIAGNOSIS — J301 Allergic rhinitis due to pollen: Secondary | ICD-10-CM | POA: Diagnosis not present

## 2019-11-29 DIAGNOSIS — I2510 Atherosclerotic heart disease of native coronary artery without angina pectoris: Secondary | ICD-10-CM | POA: Diagnosis not present

## 2019-11-29 DIAGNOSIS — M25551 Pain in right hip: Secondary | ICD-10-CM | POA: Diagnosis not present

## 2019-11-29 DIAGNOSIS — R1311 Dysphagia, oral phase: Secondary | ICD-10-CM | POA: Diagnosis not present

## 2019-11-29 DIAGNOSIS — R600 Localized edema: Secondary | ICD-10-CM | POA: Diagnosis not present

## 2019-11-29 DIAGNOSIS — R42 Dizziness and giddiness: Secondary | ICD-10-CM | POA: Diagnosis not present

## 2019-11-29 DIAGNOSIS — S72144S Nondisplaced intertrochanteric fracture of right femur, sequela: Secondary | ICD-10-CM | POA: Diagnosis not present

## 2019-11-29 DIAGNOSIS — D500 Iron deficiency anemia secondary to blood loss (chronic): Secondary | ICD-10-CM | POA: Diagnosis not present

## 2019-11-29 DIAGNOSIS — F0390 Unspecified dementia without behavioral disturbance: Secondary | ICD-10-CM | POA: Diagnosis not present

## 2019-11-29 DIAGNOSIS — Z954 Presence of other heart-valve replacement: Secondary | ICD-10-CM | POA: Diagnosis not present

## 2019-11-29 DIAGNOSIS — R404 Transient alteration of awareness: Secondary | ICD-10-CM | POA: Diagnosis not present

## 2019-11-29 DIAGNOSIS — S72144G Nondisplaced intertrochanteric fracture of right femur, subsequent encounter for closed fracture with delayed healing: Secondary | ICD-10-CM | POA: Diagnosis not present

## 2019-11-29 DIAGNOSIS — R569 Unspecified convulsions: Secondary | ICD-10-CM | POA: Diagnosis not present

## 2019-11-29 DIAGNOSIS — R41841 Cognitive communication deficit: Secondary | ICD-10-CM | POA: Diagnosis not present

## 2019-11-29 DIAGNOSIS — G250 Essential tremor: Secondary | ICD-10-CM | POA: Diagnosis not present

## 2019-11-29 DIAGNOSIS — R279 Unspecified lack of coordination: Secondary | ICD-10-CM | POA: Diagnosis not present

## 2019-11-29 DIAGNOSIS — R0602 Shortness of breath: Secondary | ICD-10-CM | POA: Diagnosis not present

## 2019-11-29 DIAGNOSIS — S72141D Displaced intertrochanteric fracture of right femur, subsequent encounter for closed fracture with routine healing: Secondary | ICD-10-CM | POA: Diagnosis not present

## 2019-11-29 DIAGNOSIS — R011 Cardiac murmur, unspecified: Secondary | ICD-10-CM | POA: Diagnosis not present

## 2019-11-29 DIAGNOSIS — R531 Weakness: Secondary | ICD-10-CM | POA: Diagnosis not present

## 2019-11-29 DIAGNOSIS — Z9181 History of falling: Secondary | ICD-10-CM | POA: Diagnosis not present

## 2019-11-29 DIAGNOSIS — S72001A Fracture of unspecified part of neck of right femur, initial encounter for closed fracture: Secondary | ICD-10-CM | POA: Diagnosis not present

## 2019-11-29 DIAGNOSIS — M25572 Pain in left ankle and joints of left foot: Secondary | ICD-10-CM | POA: Diagnosis not present

## 2019-11-29 DIAGNOSIS — I4891 Unspecified atrial fibrillation: Secondary | ICD-10-CM | POA: Diagnosis not present

## 2019-11-29 DIAGNOSIS — M109 Gout, unspecified: Secondary | ICD-10-CM | POA: Diagnosis not present

## 2019-11-29 DIAGNOSIS — M8580 Other specified disorders of bone density and structure, unspecified site: Secondary | ICD-10-CM | POA: Diagnosis not present

## 2019-11-29 DIAGNOSIS — M6281 Muscle weakness (generalized): Secondary | ICD-10-CM | POA: Diagnosis not present

## 2019-11-29 LAB — TYPE AND SCREEN
ABO/RH(D): O POS
Antibody Screen: NEGATIVE
Unit division: 0

## 2019-11-29 LAB — CBC
HCT: 25.6 % — ABNORMAL LOW (ref 36.0–46.0)
Hemoglobin: 8.2 g/dL — ABNORMAL LOW (ref 12.0–15.0)
MCH: 30.3 pg (ref 26.0–34.0)
MCHC: 32 g/dL (ref 30.0–36.0)
MCV: 94.5 fL (ref 80.0–100.0)
Platelets: 198 10*3/uL (ref 150–400)
RBC: 2.71 MIL/uL — ABNORMAL LOW (ref 3.87–5.11)
RDW: 13.3 % (ref 11.5–15.5)
WBC: 5.8 10*3/uL (ref 4.0–10.5)
nRBC: 0 % (ref 0.0–0.2)

## 2019-11-29 LAB — BPAM RBC
Blood Product Expiration Date: 202104142359
ISSUE DATE / TIME: 202103161007
Unit Type and Rh: 5100

## 2019-11-29 MED ORDER — DOCUSATE SODIUM 100 MG PO CAPS: 100.0000 mg | ORAL_CAPSULE | Freq: Two times a day (BID) | ORAL | 0 refills | Status: DC

## 2019-11-29 MED ORDER — OXYCODONE-ACETAMINOPHEN 5-325 MG PO TABS: 1.0000 | ORAL_TABLET | Freq: Three times a day (TID) | ORAL | 0 refills | Status: DC | PRN

## 2019-11-29 NOTE — Progress Notes (Signed)
Pt report given to [facility] receiving RN; [facility] transported the pt.

## 2019-11-29 NOTE — NC FL2 (Signed)
[place] LEVEL OF CARE SCREENING TOOL     IDENTIFICATION  Patient Name: Nicole Hess Birthdate: [birth year] Sex: female Admission Date (Current Location): 11/25/2019  [facility] and [place] Number:  Herbalist and Address:  [facility] [place], [place] [address] [place], [place] [address]      Provider Number: [id number]  Attending Physician Name and Address:  [place] Eric J, DO  Relative Name and Phone Number:  Arabella Merles- friend- [phone]    Current Level of Care: Hospital Recommended Level of Care: [place] Prior Approval Number:    Date Approved/Denied:   PASRR Number: [id number]  Discharge Plan: SNF    Current Diagnoses: Patient Active Problem List   Diagnosis Date Noted  . Hypocalcemia 08/23/2019  . COVID-19 virus infection 08/23/2019  . Hypokalemia 08/23/2019  . Overactive bladder 05/20/2018  . Seasonal allergic rhinitis due to pollen 05/20/2018  . Vertigo 04/29/2017  . Memory difficulty 04/29/2017  . Dementia without behavioral disturbance ([place]) 02/06/2017  . 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 History of colonic polyps 11/09/2016  . S/P AVR (aortic valve replacement) 04/02/2015  . Aortic stenosis due to bicuspid aortic valve 02/26/2015  . Severe aortic stenosis 05/02/2014  . Precordial pain 01/26/2013  . Depression 03/30/2012  . Hyperlipidemia 03/30/2012    Orientation RESPIRATION BLADDER Height & Weight     Self  Normal Continent Weight: 65 kg Height:  5\' 7"  (170.2 cm)(UTA d/t AMS, estimate)  BEHAVIORAL SYMPTOMS/MOOD NEUROLOGICAL BOWEL NUTRITION STATUS      Continent Diet  AMBULATORY STATUS COMMUNICATION OF NEEDS Skin   Limited Assist Verbally Surgical wounds, Bruising(s/p right hip fx IM nailing)                       Personal Care Assistance Level of Assistance  Bathing, Dressing Bathing Assistance: Maximum assistance   Dressing Assistance: Maximum  assistance     Functional Limitations Info             SPECIAL CARE FACTORS FREQUENCY  PT (By licensed PT), OT (By licensed OT)     PT Frequency: 5x week OT Frequency: 5x week            Contractures Contractures Info: Not present    Additional Factors Info  Code Status, Allergies Code Status Info: DNR Allergies Info: Dextromethorphan High,contraindication; Pseudoephedrine-High, contraindication; Penicillins-Low-Rash           Current Medications (11/29/2019):  This is the current hospital active medication list Current Facility-Administered Medications  Medication Dose Route Frequency Provider Last Rate Last Admin  . 0.9 %  sodium chloride infusion   Intravenous Continuous Leandrew Koyanagi, MD   Stopped at 11/26/19 [phone]  . acetaminophen (TYLENOL) tablet 325-650 mg  325-650 mg Oral Q6H PRN Leandrew Koyanagi, MD   650 mg at 11/28/19 2223  . alum & mag hydroxide-simeth (MAALOX/MYLANTA) 200-200-20 MG/5ML suspension 30 mL  30 mL Oral Q4H PRN Leandrew Koyanagi, MD      . aspirin chewable tablet 81 mg  81 mg Oral Daily Leandrew Koyanagi, MD   81 mg at 11/29/19 0958  . calcium-vitamin D (OSCAL WITH D) 500-200 MG-UNIT per tablet 1 tablet  1 tablet Oral TID Leandrew Koyanagi, MD   1 tablet at 11/29/19 0958  . docusate sodium (COLACE) capsule 100 mg  100 mg Oral BID Leandrew Koyanagi, MD   100 mg at 11/29/19 0958  . enoxaparin (LOVENOX)  injection 40 mg  40 mg Subcutaneous Q24H Leandrew Koyanagi, MD   40 mg at 11/29/19 1128  . estradiol (ESTRACE) vaginal cream 1 Applicatorful  1 Applicatorful Vaginal Q Mahlon Gammon, MD   1 Applicatorful at [phone]  . ferumoxytol (FERAHEME) 510 mg in sodium chloride 0.9 % 100 mL IVPB  510 mg Intravenous Once Danford, Suann Larry, MD      . fluticasone (FLONASE) 50 MCG/ACT nasal spray 1 spray  1 spray Each Nare Daily PRN Leandrew Koyanagi, MD      . furosemide (LASIX) tablet 20 mg  20 mg Oral Daily Leandrew Koyanagi, MD   20 mg at 11/29/19 0958  . HYDROcodone-acetaminophen  (NORCO) 7.5-325 MG per tablet 1-2 tablet  1-2 tablet Oral Q4H PRN Leandrew Koyanagi, MD      . HYDROcodone-acetaminophen (NORCO/VICODIN) 5-325 MG per tablet 1-2 tablet  1-2 tablet Oral Q4H PRN Leandrew Koyanagi, MD      . lactated ringers infusion   Intravenous Continuous Leandrew Koyanagi, MD   New Bag at 11/25/19 1524  . loratadine (CLARITIN) tablet 10 mg  10 mg Oral PRN Leandrew Koyanagi, MD      . magnesium citrate solution 1 Bottle  1 Bottle Oral Once PRN Leandrew Koyanagi, MD      . memantine [facility]) tablet 10 mg  10 mg Oral BID Leandrew Koyanagi, MD   10 mg at 11/29/19 0959  . menthol-cetylpyridinium (CEPACOL) lozenge 3 mg  1 lozenge Oral PRN Leandrew Koyanagi, MD       Or  . phenol (CHLORASEPTIC) mouth spray 1 spray  1 spray Mouth/Throat PRN Leandrew Koyanagi, MD      . methocarbamol (ROBAXIN) tablet 500 mg  500 mg Oral Q6H PRN Leandrew Koyanagi, MD       Or  . methocarbamol (ROBAXIN) 500 mg in dextrose 5 % 50 mL IVPB  500 mg Intravenous Q6H PRN Leandrew Koyanagi, MD      . morphine 2 MG/ML injection 1 mg  1 mg Intravenous Q2H PRN Leandrew Koyanagi, MD      . multivitamin with minerals tablet 1 tablet  1 tablet Oral Daily Danford, Suann Larry, MD   1 tablet at 11/29/19 0958  . ondansetron (ZOFRAN) tablet 4 mg  4 mg Oral Q6H PRN Leandrew Koyanagi, MD       Or  . ondansetron [facility]) injection 4 mg  4 mg Intravenous Q6H PRN Leandrew Koyanagi, MD      . polyethylene glycol (MIRALAX / GLYCOLAX) packet 17 g  17 g Oral Daily PRN Leandrew Koyanagi, MD      . primidone (MYSOLINE) tablet 250 mg  250 mg Oral BID Leandrew Koyanagi, MD   250 mg at 11/29/19 0959  . senna-docusate (Senokot-S) tablet 1 tablet  1 tablet Oral Q M,W,F-1800 Leandrew Koyanagi, MD   1 tablet at 11/27/19 1714  . sorbitol 70 % solution 30 mL  30 mL Oral Daily PRN Leandrew Koyanagi, MD         Discharge Medications: Please see discharge summary for a list of discharge medications.  Relevant Imaging Results:  Relevant Lab Results:   Additional Information SSN#-  [id number]  Sharin Mons, RN

## 2019-11-29 NOTE — Progress Notes (Signed)
Physical Therapy Treatment Patient Details Name: Nicole Hess MRN: [id number] DOB: 03-Mar-1946 Today's Date: 11/29/2019    History of Present Illness Pt is a 74 yo female admitted with R hip fx after falling at her facility. S/P IM nail 11/25/19. Hx of dementia, gout, CAD, aortic stenosis    PT Comments    Pt making steady progress towards achieving her current functional mobility goals. She remains limited overall secondary to pain, weakness and cognitive deficits. She was able to perform a transfer to recliner chair with mod A x2 and use of RW. Plan is for pt to d/c to SNF for further therapy services prior to returning to her memory care facility. Pt would continue to benefit from skilled physical therapy services at this time while admitted and after d/c to address the below listed limitations in order to improve overall safety and independence with functional mobility.    Follow Up Recommendations  SNF;Supervision/Assistance - 24 hour     Equipment Recommendations  None recommended by PT    Recommendations for Other Services       Precautions / Restrictions Precautions Precautions: Fall Restrictions Weight Bearing Restrictions: Yes RLE Weight Bearing: Weight bearing as tolerated    Mobility  Bed Mobility Overal bed mobility: Needs Assistance Bed Mobility: Supine to Sit     Supine to sit: Mod assist     General bed mobility comments: increased time and effort, cueing for sequencing, use of bed rails, assistance needed for trunk elevation with use of bed pads to position hips at EOB  Transfers Overall transfer level: Needs assistance Equipment used: Rolling walker (2 wheeled) Transfers: Sit to/from [facility] Sit to Stand: Mod assist Stand pivot transfers: Mod assist;+2 physical assistance;+2 safety/equipment       General transfer comment: cueing for safe hand placement and for sequencing with RW; cueing for increased reliance on bilateral UEs  with advancement of L LE towards recliner chair  Ambulation/Gait                 Stairs             Wheelchair Mobility    Modified Rankin (Stroke Patients Only)       Balance Overall balance assessment: Needs assistance;History of Falls Sitting-balance support: Feet supported Sitting balance-Leahy Scale: Fair     Standing balance support: Bilateral upper extremity supported Standing balance-Leahy Scale: Poor                              Cognition Arousal/Alertness: Awake/alert Behavior During Therapy: Flat affect Overall Cognitive Status: No family/caregiver present to determine baseline cognitive functioning Area of Impairment: Orientation;Memory;Following commands;Safety/judgement;Problem solving                 Orientation Level: Disoriented to;Situation;Time;Place   Memory: Decreased short-term memory;Decreased recall of precautions Following Commands: Follows one step commands inconsistently;Follows one step commands with increased time Safety/Judgement: Decreased awareness of deficits;Decreased awareness of safety   Problem Solving: Slow processing;Decreased initiation;Difficulty sequencing;Requires verbal cues;Requires tactile cues        Exercises      General Comments        Pertinent Vitals/Pain Pain Assessment: Faces Faces Pain Scale: Hurts a little bit Pain Location: RLE with movement Pain Descriptors / Indicators: Guarding Pain Intervention(s): Monitored during session;Repositioned    Home Living  Prior Function            PT Goals (current goals can now be found in the care plan section) Acute Rehab PT Goals PT Goal Formulation: With patient Time For Goal Achievement: 12/10/19 Potential to Achieve Goals: Good Progress towards PT goals: Progressing toward goals    Frequency    Min 3X/week      PT Plan Current plan remains appropriate    Co-evaluation               AM-PAC PT "6 Clicks" Mobility   Outcome Measure  Help needed turning from your back to your side while in a flat bed without using bedrails?: A Little Help needed moving from lying on your back to sitting on the side of a flat bed without using bedrails?: A Lot Help needed moving to and from a bed to a chair (including a wheelchair)?: A Lot Help needed standing up from a chair using your arms (e.g., wheelchair or bedside chair)?: A Lot Help needed to walk in hospital room?: A Lot Help needed climbing 3-5 steps with a railing? : Total 6 Click Score: 12    End of Session Equipment Utilized During Treatment: Gait belt Activity Tolerance: Patient tolerated treatment well Patient left: in chair;with call bell/phone within reach;with chair alarm set Nurse Communication: Mobility status;Precautions;Weight bearing status PT Visit Diagnosis: History of falling (Z91.81);Muscle weakness (generalized) (M62.81);Difficulty in walking, not elsewhere classified (R26.2);Other abnormalities of gait and mobility (R26.89)     Time: [id number] PT Time Calculation (min) (ACUTE ONLY): 16 min  Charges:  $Therapeutic Activity: 8-22 mins                     Anastasio Champion, DPT  Acute Rehabilitation Services Pager [phone] Office [place] 11/29/2019, 10:06 AM

## 2019-11-29 NOTE — Care Management Important Message (Signed)
Important Message  Patient Details  Name: Nicole Hess MRN: [id number] Date of Birth: [birth year]   Medicare Important Message Given:  Yes     Orbie Pyo 11/29/2019, 4:27 PM

## 2019-11-29 NOTE — Discharge Summary (Signed)
Physician Discharge Summary  Nicole Hess [id number] DOB: 1945/10/10 DOA: 11/25/2019  PCP: Nicole Poll, MD  Admit date: 11/25/2019 Discharge date: 11/29/2019  Admitted From: [place] memory care Disposition: [facility]  Recommendations for Outpatient Follow-up:  1. Follow up with PCP in 1-2 weeks 2. Follow-up with orthopedics, Dr. Erlinda Hong for postoperative wound check 3. Please obtain CBC in one week to assess hemoglobin level  Home Health: N/A Equipment/Devices: none  Discharge Condition: Stable CODE STATUS: DNR Diet recommendation: Heart healthy diet  History of present illness:  [name] is a 74 y.o.  female with past medical history remarkable for dementia, bicuspid aortic valve s/p bioprosthetic replacement 2016, and CAD no recent PCI who presented with fall and hip pain.  In the ER, found to have RIGHT intertrochanteric hip fracture and underwent IM nail by Dr. Erlinda Hong on 11/25/2019.  Hospital course:  Right intertrochanteric hip fracture s/p ORIF w/ IMN 11/25/2019 Patient presenting from memory care unit status post fall with subsequent right intertrochanteric hip fracture underwent ORIF with IM nail right hip by Dr. Erlinda Hong on 11/25/2019.  Continue oxycodone as needed for pain relief.  DVT prophylaxis with Lovenox per orthopedics.  Continue calcium and vitamin D supplementation.  Outpatient follow-up with Dr. Erlinda Hong 2 weeks postoperative for wound check.  Dementia Continue memantine  Acute blood loss anemia on anemia of chronic disease Patient with downward trend of hemoglobin postoperatively to 7.2, transfused 1 unit PRBC and IV Feraheme on 11/28/2019.  Recommend repeat CBC in 1 week.  Essential tremor Continue primidone  Coronary disease History of aortic valve repair Continue lasix and aspirin  Discharge Diagnoses:  Active Problems:   * No active hospital problems. *    Discharge Instructions  Discharge Instructions    Diet - low sodium heart healthy    Complete by: As directed    Increase activity slowly   Complete by: As directed    Weight bearing as tolerated   Complete by: As directed      Allergies as of 11/29/2019      Reactions   Dextromethorphan Other (See Comments)   contraindication   Pseudoephedrine Other (See Comments)   contraindication   Penicillins Rash   Has patient had a PCN reaction causing immediate rash, facial/tongue/throat swelling, SOB or lightheadedness with hypotension: No Has patient had a PCN reaction causing severe rash involving mucus membranes or skin necrosis: No Has patient had a PCN reaction that required hospitalization No Has patient had a PCN reaction occurring within the last 10 years: No If all of the above answers are "NO", then may proceed with Cephalosporin use.      Medication List    STOP taking these medications   tamsulosin 0.4 MG Caps capsule Commonly known as: FLOMAX     TAKE these medications   aspirin 81 MG chewable tablet Chew 81 mg by mouth daily.   docusate sodium 100 MG capsule Commonly known as: COLACE Take 1 capsule (100 mg total) by mouth 2 (two) times daily.   enoxaparin 40 MG/0.4ML injection Commonly known as: LOVENOX Inject 0.4 mLs (40 mg total) into the skin daily.   estradiol 0.1 MG/GM vaginal cream Commonly known as: ESTRACE See admin instructions. Apply topically to labia on Mondays at bedtime.   fluticasone 50 MCG/ACT nasal spray Commonly known as: FLONASE Place 1 spray into both nostrils daily.   furosemide 20 MG tablet Commonly known as: LASIX Take 10 mg by mouth daily.   loratadine 10 MG tablet Commonly  known as: CLARITIN Take 10 mg by mouth daily as needed for allergies.   memantine 10 MG tablet Commonly known as: NAMENDA Take 1 tablet (10 mg total) by mouth 2 (two) times daily.   oxyCODONE-acetaminophen 5-325 MG tablet Commonly known as: Percocet Take 1-2 tablets by mouth every 8 (eight) hours as needed for severe pain.   primidone  250 MG tablet Commonly known as: MYSOLINE TAKE 1 TABLET BY MOUTH TWICE DAILY.   psyllium 0.52 g capsule Commonly known as: REGULOID Take 0.52 g by mouth at bedtime.   sennosides-docusate sodium 8.6-50 MG tablet Commonly known as: SENOKOT-S Take 1 tablet by mouth See admin instructions. Take 1 tablet by mouth daily at bedtime on Monday, Wednesday and Friday            Discharge Care Instructions  (From admission, onward)         Start     Ordered   11/26/19 0000  Weight bearing as tolerated     11/26/19 0902          Contact information for follow-up providers    Leandrew Koyanagi, MD. Schedule an appointment as soon as possible for a visit in 2 week(s).   Specialty: Orthopedic Surgery Contact information: [place] [address] [phone]        Nicole Poll, MD. Schedule an appointment as soon as possible for a visit in 1 week(s).   Specialty: Family Medicine Contact information: [address] [place] [address] [phone]            Contact information for after-discharge care    Destination    [facility] .   Service: Skilled Nursing Contact information: [id number] [place] [address] [phone]                 Allergies  Allergen Reactions  . Dextromethorphan Other (See Comments)    contraindication  . Pseudoephedrine Other (See Comments)    contraindication  . Penicillins Rash    Has patient had a PCN reaction causing immediate rash, facial/tongue/throat swelling, SOB or lightheadedness with hypotension: No Has patient had a PCN reaction causing severe rash involving mucus membranes or skin necrosis: No Has patient had a PCN reaction that required hospitalization No Has patient had a PCN reaction occurring within the last 10 years: No If all of the above answers are "NO", then may proceed with Cephalosporin use.    Consultations:  Orthopedics -  Dr. Erlinda Hong   Procedures/Studies: DG Ankle Complete Right  Result Date: 11/25/2019 CLINICAL DATA:  Fall EXAM: RIGHT ANKLE - COMPLETE 3+ VIEW COMPARISON:  None. FINDINGS: Osteopenia. There is no evidence of fracture, dislocation, or joint effusion. There is no evidence of arthropathy or other focal bone abnormality. Diffuse soft tissue edema. IMPRESSION: 1.  No fracture or dislocation of the right ankle. 2.  Osteopenia. 3.  Diffuse soft tissue edema. Electronically Signed   By: Eddie Candle M.D.   On: 11/25/2019 12:29   CT HEAD WO CONTRAST  Result Date: 11/25/2019 CLINICAL DATA:  Fall, head trauma EXAM: CT HEAD WITHOUT CONTRAST CT CERVICAL SPINE WITHOUT CONTRAST TECHNIQUE: Multidetector CT imaging of the head and cervical spine was performed following the standard protocol without intravenous contrast. Multiplanar CT image reconstructions of the cervical spine were also generated. COMPARISON:  07/25/2019 FINDINGS: CT HEAD FINDINGS Brain: No evidence of acute infarction, hemorrhage, hydrocephalus, extra-axial collection or mass lesion/mass effect. Mild subcortical white matter  and periventricular small vessel ischemic changes. Vascular: Mild intracranial atherosclerosis. Skull: Normal. Negative for fracture or focal lesion. Sinuses/Orbits: Mild mucosal thickening in the bilateral maxillary sinuses. Minimal partial opacification of the right ethmoid air cells. Visualized paranasal sinuses and mastoid air cells are otherwise clear. Other: None. CT CERVICAL SPINE FINDINGS Alignment: Normal cervical lordosis. Skull base and vertebrae: No acute fracture. No primary bone lesion or focal pathologic process. Soft tissues and spinal canal: No prevertebral fluid or swelling. No visible canal hematoma. Disc levels: Mild degenerative changes of the mid/lower cervical spine. Spinal canal is patent. Upper chest: Visualized lung apices are clear. Other: Visualized thyroid is unremarkable. IMPRESSION: No evidence of acute  intracranial abnormality. Mild small vessel ischemic changes. No evidence of traumatic injury to the cervical spine. Mild degenerative changes. Electronically Signed   By: Julian Hy M.D.   On: 11/25/2019 12:13   CT CERVICAL SPINE WO CONTRAST  Result Date: 11/25/2019 CLINICAL DATA:  Fall, head trauma EXAM: CT HEAD WITHOUT CONTRAST CT CERVICAL SPINE WITHOUT CONTRAST TECHNIQUE: Multidetector CT imaging of the head and cervical spine was performed following the standard protocol without intravenous contrast. Multiplanar CT image reconstructions of the cervical spine were also generated. COMPARISON:  07/25/2019 FINDINGS: CT HEAD FINDINGS Brain: No evidence of acute infarction, hemorrhage, hydrocephalus, extra-axial collection or mass lesion/mass effect. Mild subcortical white matter and periventricular small vessel ischemic changes. Vascular: Mild intracranial atherosclerosis. Skull: Normal. Negative for fracture or focal lesion. Sinuses/Orbits: Mild mucosal thickening in the bilateral maxillary sinuses. Minimal partial opacification of the right ethmoid air cells. Visualized paranasal sinuses and mastoid air cells are otherwise clear. Other: None. CT CERVICAL SPINE FINDINGS Alignment: Normal cervical lordosis. Skull base and vertebrae: No acute fracture. No primary bone lesion or focal pathologic process. Soft tissues and spinal canal: No prevertebral fluid or swelling. No visible canal hematoma. Disc levels: Mild degenerative changes of the mid/lower cervical spine. Spinal canal is patent. Upper chest: Visualized lung apices are clear. Other: Visualized thyroid is unremarkable. IMPRESSION: No evidence of acute intracranial abnormality. Mild small vessel ischemic changes. No evidence of traumatic injury to the cervical spine. Mild degenerative changes. Electronically Signed   By: Julian Hy M.D.   On: 11/25/2019 12:13   DG Chest Port 1 View  Result Date: 11/25/2019 CLINICAL DATA:  Hip fracture.  EXAM: PORTABLE CHEST 1 VIEW COMPARISON:  08/23/2019 FINDINGS: Stable changes from prior cardiac surgery and valve replacement. With cardiac silhouette is normal in size. No mediastinal or hilar masses. Clear lungs.  No pleural effusion or pneumothorax. Skeletal structures are grossly intact. IMPRESSION: No acute cardiopulmonary disease. Electronically Signed   By: Lajean Manes M.D.   On: 11/25/2019 13:34   DG C-Arm 1-60 Min  Result Date: 11/25/2019 CLINICAL DATA:  Imaging provided for ORIF for a right proximal femur fracture. EXAM: DG C-ARM 1-60 MIN; RIGHT FEMUR 2 VIEWS COMPARISON:  11/25/2019 at 12:02 p.m. FINDINGS: Submitted images show placement an intramedullary rod supporting 2 compression screws, reducing the primary intertrochanteric fracture components into near anatomic alignment. The orthopedic hardware appears well seated. IMPRESSION: Well aligned proximal right femur fracture following ORIF. Electronically Signed   By: Lajean Manes M.D.   On: 11/25/2019 17:18   DG Hip Unilat With Pelvis 2-3 Views Right  Result Date: 11/25/2019 CLINICAL DATA:  Fall, pain and deformity EXAM: DG HIP (WITH OR WITHOUT PELVIS) 2-3V RIGHT COMPARISON:  None. FINDINGS: There is a comminuted, displaced intratrochanteric fracture of the right hip with displaced fragments of the greater  and lesser trochanters. No other fracture or dislocation of the pelvis or proximal left femur as included. Osteopenia. Large burden of stool and stool balls in the colon and rectum. IMPRESSION: Comminuted, displaced intratrochanteric fracture of the right hip. Electronically Signed   By: Eddie Candle M.D.   On: 11/25/2019 12:27   DG FEMUR, MIN 2 VIEWS RIGHT  Result Date: 11/25/2019 CLINICAL DATA:  Imaging provided for ORIF for a right proximal femur fracture. EXAM: DG C-ARM 1-60 MIN; RIGHT FEMUR 2 VIEWS COMPARISON:  11/25/2019 at 12:02 p.m. FINDINGS: Submitted images show placement an intramedullary rod supporting 2 compression screws,  reducing the primary intertrochanteric fracture components into near anatomic alignment. The orthopedic hardware appears well seated. IMPRESSION: Well aligned proximal right femur fracture following ORIF. Electronically Signed   By: Lajean Manes M.D.   On: 11/25/2019 17:18      Subjective: Patient seen and examined bedside, resting comfortably.  Pleasantly confused.  No specific complaints this morning.  Denies headache, no chest pain, palpitations, no shortness of breath, no abdominal pain, no weakness, no fatigue.  No acute events overnight per nursing staff.  Discharge Exam: Vitals:   11/28/19 1934 11/29/19 0457  BP: (!) 117/54 (!) 113/58  Pulse: 85 71  Resp: 16 16  Temp: 99 F (37.2 C) 98.3 F (36.8 C)  SpO2: 100% 100%   Vitals:   11/28/19 1345 11/28/19 1500 11/28/19 1934 11/29/19 0457  BP: (!) 107/43 (!) 110/51 (!) 117/54 (!) 113/58  Pulse: 84 82 85 71  Resp: 16 16 16 16   Temp: 99 F (37.2 C) 98.9 F (37.2 C) 99 F (37.2 C) 98.3 F (36.8 C)  TempSrc: Oral Oral Oral Oral  SpO2: 100% 100% 100% 100%  Weight:      Height:        General: Pt is alert, awake, not in acute distress Cardiovascular: RRR, S1/S2 +, no rubs, no gallops Respiratory: CTA bilaterally, no wheezing, no rhonchi Abdominal: Soft, NT, ND, bowel sounds + Extremities: no edema, no cyanosis    The results of significant diagnostics from this hospitalization (including imaging, microbiology, ancillary and laboratory) are listed below for reference.     Microbiology: Recent Results (from the past 240 hour(s))  Respiratory Panel by RT PCR (Flu A&B, Covid) - Nasopharyngeal Swab     Status: None   Collection Time: 11/25/19 11:36 AM   Specimen: Nasopharyngeal Swab  Result Value Ref Range Status   SARS Coronavirus 2 by RT PCR NEGATIVE NEGATIVE Final    Comment: (NOTE) SARS-CoV-2 target nucleic acids are NOT DETECTED. The SARS-CoV-2 RNA is generally detectable in upper respiratoy specimens during the  acute phase of infection. The lowest concentration of SARS-CoV-2 viral copies this assay can detect is 131 copies/mL. A negative result does not preclude SARS-Cov-2 infection and should not be used as the sole basis for treatment or other patient management decisions. A negative result may occur with  improper specimen collection/handling, submission of specimen other than nasopharyngeal swab, presence of viral mutation(s) within the areas targeted by this assay, and inadequate number of viral copies (<131 copies/mL). A negative result must be combined with clinical observations, patient history, and epidemiological information. The expected result is Negative. Fact Sheet for Patients:  PinkCheek.be Fact Sheet for Healthcare Providers:  GravelBags.it This test is not yet ap proved or cleared by the [place] FDA and  has been authorized for detection and/or diagnosis of SARS-CoV-2 by FDA under an Emergency Use Authorization (EUA). This EUA will remain  in effect (meaning this test can be used) for the duration of the COVID-19 declaration under Section 564(b)(1) of the Act, 21 U.S.C. section 360bbb-3(b)(1), unless the authorization is terminated or revoked sooner.    Influenza A by PCR NEGATIVE NEGATIVE Final   Influenza B by PCR NEGATIVE NEGATIVE Final    Comment: (NOTE) The Xpert Xpress SARS-CoV-2/FLU/RSV assay is intended as an aid in  the diagnosis of influenza from Nasopharyngeal swab specimens and  should not be used as a sole basis for treatment. Nasal washings and  aspirates are unacceptable for Xpert Xpress SARS-CoV-2/FLU/RSV  testing. Fact Sheet for Patients: PinkCheek.be Fact Sheet for Healthcare Providers: GravelBags.it This test is not yet approved or cleared by the [place] FDA and  has been authorized for detection and/or diagnosis of SARS-CoV-2  by  FDA under an Emergency Use Authorization (EUA). This EUA will remain  in effect (meaning this test can be used) for the duration of the  Covid-19 declaration under Section 564(b)(1) of the Act, 21  U.S.C. section 360bbb-3(b)(1), unless the authorization is  terminated or revoked. Performed at [facility], [place] [address], [place], [place] [address]   SARS CORONAVIRUS 2 (TAT 6-24 HRS) Nasopharyngeal Nasopharyngeal Swab     Status: None   Collection Time: 11/28/19  5:58 PM   Specimen: Nasopharyngeal Swab  Result Value Ref Range Status   SARS Coronavirus 2 NEGATIVE NEGATIVE Final    Comment: (NOTE) SARS-CoV-2 target nucleic acids are NOT DETECTED. The SARS-CoV-2 RNA is generally detectable in upper and lower respiratory specimens during the acute phase of infection. Negative results do not preclude SARS-CoV-2 infection, do not rule out co-infections with other pathogens, and should not be used as the sole basis for treatment or other patient management decisions. Negative results must be combined with clinical observations, patient history, and epidemiological information. The expected result is Negative. Fact Sheet for Patients: SugarRoll.be Fact Sheet for Healthcare Providers: https://www.woods-mathews.com/ This test is not yet approved or cleared by the [place] FDA and  has been authorized for detection and/or diagnosis of SARS-CoV-2 by FDA under an Emergency Use Authorization (EUA). This EUA will remain  in effect (meaning this test can be used) for the duration of the COVID-19 declaration under Section 56 4(b)(1) of the Act, 21 U.S.C. section 360bbb-3(b)(1), unless the authorization is terminated or revoked sooner. Performed at [facility], [place] [address], [place], [place] [address]      Labs: BNP (last 3 results) Recent Labs    11/25/19 1649  BNP [id number]*   Basic Metabolic Panel: Recent Labs  Lab  11/25/19 1136 11/26/19 0339 11/27/19 0254 11/28/19 0425  NA 138 138 138 139  K 4.0 3.9 3.5 3.4*  CL 101 102 102 100  CO2 24 27 26 28   GLUCOSE 135* 131* 133* 108*  BUN 35* 20 13 19   CREATININE 1.09* 0.67 0.70 0.69  CALCIUM 8.4* 8.2* 8.1* 8.1*   Liver Function Tests: No results for input(s): AST, ALT, ALKPHOS, BILITOT, PROT, ALBUMIN in the last 168 hours. No results for input(s): LIPASE, AMYLASE in the last 168 hours. No results for input(s): AMMONIA in the last 168 hours. CBC: Recent Labs  Lab 11/25/19 1136 11/26/19 0339 11/27/19 0254 11/28/19 0425 11/29/19 0222  WBC 5.2 6.6 7.3 5.7 5.8  NEUTROABS 4.2  --   --   --   --   HGB 10.3* 8.4* 7.6* 7.2* 8.2*  HCT 33.0* 26.3* 23.5* 22.5* 25.6*  MCV 98.5 96.7  94.8 97.4 94.5  PLT 161 133* 141* 177 198   Cardiac Enzymes: No results for input(s): CKTOTAL, CKMB, CKMBINDEX, TROPONINI in the last 168 hours. BNP: Invalid input(s): POCBNP CBG: No results for input(s): GLUCAP in the last 168 hours. D-Dimer No results for input(s): DDIMER in the last 72 hours. Hgb A1c No results for input(s): HGBA1C in the last 72 hours. Lipid Profile No results for input(s): CHOL, HDL, LDLCALC, TRIG, CHOLHDL, LDLDIRECT in the last 72 hours. Thyroid function studies No results for input(s): TSH, T4TOTAL, T3FREE, THYROIDAB in the last 72 hours.  Invalid input(s): FREET3 Anemia work up No results for input(s): VITAMINB12, FOLATE, FERRITIN, TIBC, IRON, RETICCTPCT in the last 72 hours. Urinalysis    Component Value Date/Time   COLORURINE YELLOW 08/23/2019 2246   APPEARANCEUR CLEAR 08/23/2019 2246   LABSPEC 1.036 (H) 08/23/2019 2246   PHURINE 5.0 08/23/2019 2246   GLUCOSEU NEGATIVE 08/23/2019 2246   GLUCOSEU NEGATIVE 11/06/2016 1022   HGBUR NEGATIVE 08/23/2019 2246   BILIRUBINUR NEGATIVE 08/23/2019 2246   BILIRUBINUR N 07/04/2019 1119   KETONESUR 5 (A) 08/23/2019 2246   PROTEINUR NEGATIVE 08/23/2019 2246   UROBILINOGEN 0.2 07/04/2019 1119    UROBILINOGEN 0.2 05/08/2018 1732   NITRITE NEGATIVE 08/23/2019 2246   LEUKOCYTESUR TRACE (A) 08/23/2019 2246   Sepsis Labs Invalid input(s): PROCALCITONIN,  WBC,  LACTICIDVEN Microbiology Recent Results (from the past 240 hour(s))  Respiratory Panel by RT PCR (Flu A&B, Covid) - Nasopharyngeal Swab     Status: None   Collection Time: 11/25/19 11:36 AM   Specimen: Nasopharyngeal Swab  Result Value Ref Range Status   SARS Coronavirus 2 by RT PCR NEGATIVE NEGATIVE Final    Comment: (NOTE) SARS-CoV-2 target nucleic acids are NOT DETECTED. The SARS-CoV-2 RNA is generally detectable in upper respiratoy specimens during the acute phase of infection. The lowest concentration of SARS-CoV-2 viral copies this assay can detect is 131 copies/mL. A negative result does not preclude SARS-Cov-2 infection and should not be used as the sole basis for treatment or other patient management decisions. A negative result may occur with  improper specimen collection/handling, submission of specimen other than nasopharyngeal swab, presence of viral mutation(s) within the areas targeted by this assay, and inadequate number of viral copies (<131 copies/mL). A negative result must be combined with clinical observations, patient history, and epidemiological information. The expected result is Negative. Fact Sheet for Patients:  PinkCheek.be Fact Sheet for Healthcare Providers:  GravelBags.it This test is not yet ap proved or cleared by the [place] FDA and  has been authorized for detection and/or diagnosis of SARS-CoV-2 by FDA under an Emergency Use Authorization (EUA). This EUA will remain  in effect (meaning this test can be used) for the duration of the COVID-19 declaration under Section 564(b)(1) of the Act, 21 U.S.C. section 360bbb-3(b)(1), unless the authorization is terminated or revoked sooner.    Influenza A by PCR NEGATIVE NEGATIVE  Final   Influenza B by PCR NEGATIVE NEGATIVE Final    Comment: (NOTE) The Xpert Xpress SARS-CoV-2/FLU/RSV assay is intended as an aid in  the diagnosis of influenza from Nasopharyngeal swab specimens and  should not be used as a sole basis for treatment. Nasal washings and  aspirates are unacceptable for Xpert Xpress SARS-CoV-2/FLU/RSV  testing. Fact Sheet for Patients: PinkCheek.be Fact Sheet for Healthcare Providers: GravelBags.it This test is not yet approved or cleared by the [place] FDA and  has been authorized for detection and/or diagnosis of SARS-CoV-2 by  FDA  under an Emergency Use Authorization (EUA). This EUA will remain  in effect (meaning this test can be used) for the duration of the  Covid-19 declaration under Section 564(b)(1) of the Act, 21  U.S.C. section 360bbb-3(b)(1), unless the authorization is  terminated or revoked. Performed at [facility], [place] [address] [place], [place] [address]   SARS CORONAVIRUS 2 (TAT 6-24 HRS) Nasopharyngeal Nasopharyngeal Swab     Status: None   Collection Time: 11/28/19  5:58 PM   Specimen: Nasopharyngeal Swab  Result Value Ref Range Status   SARS Coronavirus 2 NEGATIVE NEGATIVE Final    Comment: (NOTE) SARS-CoV-2 target nucleic acids are NOT DETECTED. The SARS-CoV-2 RNA is generally detectable in upper and lower respiratory specimens during the acute phase of infection. Negative results do not preclude SARS-CoV-2 infection, do not rule out co-infections with other pathogens, and should not be used as the sole basis for treatment or other patient management decisions. Negative results must be combined with clinical observations, patient history, and epidemiological information. The expected result is Negative. Fact Sheet for Patients: SugarRoll.be Fact Sheet for Healthcare  Providers: https://www.woods-mathews.com/ This test is not yet approved or cleared by the [place] FDA and  has been authorized for detection and/or diagnosis of SARS-CoV-2 by FDA under an Emergency Use Authorization (EUA). This EUA will remain  in effect (meaning this test can be used) for the duration of the COVID-19 declaration under Section 56 4(b)(1) of the Act, 21 U.S.C. section 360bbb-3(b)(1), unless the authorization is terminated or revoked sooner. Performed at [facility], [place] [address] [place], [place] [address]      Time coordinating discharge: Over 30 minutes  SIGNED:   Brandn Mcgath J [place] DO  [facility] 11/29/2019, 7:56 AM

## 2019-11-29 NOTE — TOC Transition Note (Signed)
Transition of Care [facility]) - CM/SW Discharge Note   Patient Details  Name: TAMICHA TOWSLEY MRN: [id number] Date of Birth: 1945-12-23  Transition of Care [facility] CM/SW Contact:  Sharin Mons, RN Phone Number: 11/29/2019, 9:21 AM   Clinical Narrative:     Patient will DC to: [facility] SNF Anticipated DC date: 11/29/2019 Family notified:[name] Transport by: Corey Harold   Per MD patient ready for DC today to [facility] . RN, patient, patient's family, and facility notified of DC. Discharge Summary and FL2 sent to facility. RN to call report prior to discharge [phone]). DC packet on chart. Ambulance transport requested for patient.   RNCM will sign off for now as intervention is no longer needed. Please consult [place] again if new needs arise.  Arabella Merles ([phone]        Final next level of care: Skilled Nursing Facility([facility]) Barriers to Discharge: No Barriers Identified   Patient Goals and CMS Choice        Discharge Placement                       Discharge Plan and Services                                     Social Determinants of Health (SDOH) Interventions     Readmission Risk Interventions No flowsheet data found.

## 2019-11-30 ENCOUNTER — Encounter: Payer: Self-pay | Admitting: Internal Medicine

## 2019-11-30 ENCOUNTER — Non-Acute Institutional Stay (SKILLED_NURSING_FACILITY): Payer: PPO | Admitting: Internal Medicine

## 2019-11-30 DIAGNOSIS — S72144G Nondisplaced intertrochanteric fracture of right femur, subsequent encounter for closed fracture with delayed healing: Secondary | ICD-10-CM | POA: Diagnosis not present

## 2019-11-30 DIAGNOSIS — F0390 Unspecified dementia without behavioral disturbance: Secondary | ICD-10-CM | POA: Diagnosis not present

## 2019-11-30 DIAGNOSIS — D500 Iron deficiency anemia secondary to blood loss (chronic): Secondary | ICD-10-CM | POA: Diagnosis not present

## 2019-11-30 NOTE — Assessment & Plan Note (Signed)
Weightbearing as tolerated with PT/OT.  Follow-up with orthopedics in approximately 2 weeks for wound check.

## 2019-11-30 NOTE — Assessment & Plan Note (Signed)
Monitor for any bleeding dyscrasias while on the Lovenox prophylaxis and repeat CBC in 1 week, 3/24.

## 2019-11-30 NOTE — Assessment & Plan Note (Addendum)
11/30/2019: She gave the date as "December 20, 20".  She could not name the president. No behavioral issues reported to date Return to [facility] unit upon completion of PT/OT

## 2019-11-30 NOTE — Patient Instructions (Signed)
See assessment and plan under each diagnosis in the problem list and acutely for this visit 

## 2019-11-30 NOTE — Progress Notes (Signed)
NURSING HOME LOCATION:  [facility] ROOM NUMBER:  [id number]  CODE STATUS:  DNR  PCP:  Reymundo Poll, MD  [address] [place] [id number]  This is a comprehensive admission note to [facility] performed on this date less than 30 days from date of admission. Included are preadmission medical/surgical history; reconciled medication list; family history; social history and comprehensive review of systems.  Corrections and additions to the records were documented. Comprehensive physical exam was also performed. Additionally a clinical summary was entered for each active diagnosis pertinent to this admission in the Problem List to enhance continuity of care.  HPI: She was hospitalized 3/13-3/17/2021 admitted from [facility] memory care unit following a mechanical fall which resulted in a right intertrochanteric hip fracture.  IM nailing was performed by Dr. Erlinda Hong on 3/13.  Her hospital course was complicated by a downward trend in her hemoglobin to a level of 7.2.  She was transfused 1 unit of packed cells and given IV Feraheme on 3/16.  Follow-up CBC was recommended 1 week. Otherwise course was uncomplicated.  Oxycodone was continued as needed for pain relief.  DVT prophylaxis was  Lovenox beginning 3/14 and continuing for a total of 2 weeks, until 3/28.  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 She was discharged to the SNF for PT/OT.  Past medical and surgical history: Includes dementia for which she is on memantine; essential tremor for which she takes primidone; extrinsic rhinitis, history of vertigo, remote history of seizures as a child, gout, depression, and CAD. Surgeries include aortic valve repair for bicuspid valve in 2016.  She is also had a partial hysterectomy.  Social history: Never smoked, former social drinker.  Family history: Reviewed, noncontributory due to advanced age.   Review of systems:  Could not be  completed due to dementia. Date given as "December 20, 20".  She cannot identify the president.  She denied any active symptoms including any extremity pain at the operative site.  Physical exam:  Pertinent or positive findings: She appears pale and somewhat chronically ill.  She was soundly asleep and difficult to arouse initially.  Upon awakening she was still very lethargic.  She would respond to questions but basically in monosyllables.  The eyebrows are decreased laterally.  She has a grade 1 systolic murmur.  Both S1 and S2 are accentuated.  She has nonpitting edema.  Dorsalis pedis pulses are stronger than posterior tibial pulses.  There are 2 surgical dressings over the right lower extremity, 1 above the knee and one at the hip.  The right great toenail is absent.  General appearance:  no acute distress, increased work of breathing is present.   Lymphatic: No lymphadenopathy about the head, neck, axilla. Eyes: No conjunctival inflammation or lid edema is present. There is no scleral icterus. Ears:  External ear exam shows no significant lesions or deformities.   Nose:  External nasal examination shows no deformity or inflammation. Nasal mucosa are pink and moist without lesions, exudates Oral exam: Lips and gums are healthy appearing.There is no oropharyngeal erythema or exudate. Neck:  No thyromegaly, masses, tenderness noted.    Heart:  Normal rate and regular rhythm without gallop, click, rub.  Lungs: Chest clear to auscultation without wheezes, rhonchi, rales, rubs. Abdomen: Bowel sounds are normal.  Abdomen is soft and nontender with no organomegaly, hernias, masses. GU: Deferred  Extremities:  No cyanosis, clubbing. Neurologic exam:  Balance, Rhomberg, finger to nose testing  could not be completed due to clinical state Skin: Warm & dry w/o tenting. No significant lesions or rash.  See clinical summary under each active problem in the Problem List with associated updated therapeutic  plan

## 2019-12-05 ENCOUNTER — Non-Acute Institutional Stay (SKILLED_NURSING_FACILITY): Payer: PPO | Admitting: Adult Health

## 2019-12-05 ENCOUNTER — Encounter: Payer: Self-pay | Admitting: Adult Health

## 2019-12-05 DIAGNOSIS — D500 Iron deficiency anemia secondary to blood loss (chronic): Secondary | ICD-10-CM

## 2019-12-05 DIAGNOSIS — S72144S Nondisplaced intertrochanteric fracture of right femur, sequela: Secondary | ICD-10-CM | POA: Diagnosis not present

## 2019-12-05 DIAGNOSIS — G40909 Epilepsy, unspecified, not intractable, without status epilepticus: Secondary | ICD-10-CM | POA: Diagnosis not present

## 2019-12-05 DIAGNOSIS — J301 Allergic rhinitis due to pollen: Secondary | ICD-10-CM

## 2019-12-05 DIAGNOSIS — R600 Localized edema: Secondary | ICD-10-CM | POA: Diagnosis not present

## 2019-12-05 NOTE — Progress Notes (Addendum)
Location:  [place] Room Number: 315-A Place of Service:  SNF (31) Provider:  Durenda Age, DNP, FNP-BC  Patient Care Team: Reymundo Poll, MD as PCP - General (Family Medicine) Martinique, Peter M, MD as Consulting Physician (Cardiology)  Extended Emergency Contact Information Primary Emergency Contact: Peter Minium, [place] [address] Johnnette Litter of [place] Phone: [phone] Relation: Friend Secondary Emergency Contact: Orie Fisherman, [place] of [place] Phone: [phone] Relation: Relative  Code Status:  DNR  Goals of care: Advanced Directive information Advanced Directives 11/30/2019  Does Patient Have a Medical Advance Directive? Yes  Type of Advance Directive Living will;Healthcare Power of Attorney  Does patient want to make changes to medical advance directive? No - Patient declined  Copy of [place] in Chart? Yes - validated most recent copy scanned in chart (See row information)  Would patient like information on creating a medical advance directive? -     Chief Complaint  Patient presents with  . Medical Management of Chronic Issues    Routine rehabilitation visit    HPI:  Pt is a 74 y.o. female seen today for medical management of chronic diseases. She is a short-term rehabilitation resident at [facility].. She has a PMH of dementia, essential tremor, history of vertigo, depression and CAD. She was admitted to [facility] on 11/29/19 post hospitalization 11/25/19 to 11/29/19 S/P mechanical fall sustaining right intertrochanteric hip fracture for which she had IM nail on 11/25/19. She is a patient from a memory care unit.   She was seen in her room today. Staff reported that she stands up in the room and walks. Noted RLE 2+ edema. Pedal pulse was not noted. No bruising. She denies pain when palpated. Right thigh with staples on surgical sites,  no erythema.    Past Medical History:  Diagnosis Date  . Abnormal Pap smear   . Anemia   . Anxiety   . Aortic stenosis   . Bicuspid aortic valve   . Coronary artery disease   . Depression   . Gout   . Heart murmur   . Heart valve problem    will have follow up Echo-Dr Jordan-will have replacement in a couple of years  . Leukopenia    Seen by Dr. Lamonte Sakai, present since 2008, watchful waiting  . Memory difficulty 04/29/2017  . Osteopenia   . Seizures ([place])    as a child  . Shortness of breath dyspnea   . Vertigo 04/29/2017   Past Surgical History:  Procedure Laterality Date  . AORTIC VALVE REPLACEMENT N/A 02/26/2015   Procedure: AORTIC VALVE REPLACEMENT (AVR);  Surgeon: Gaye Pollack, MD;  Location: [facility];  Service: Open Heart Surgery;  Laterality: N/A;  . CARDIAC CATHETERIZATION N/A 01/29/2015   Procedure: Right/Left Heart Cath and Coronary Angiography;  Surgeon: Peter M Martinique, MD;  Location: [facility] CV LAB;  Service: Cardiovascular;  Laterality: N/A;  . CERVICAL CONE BIOPSY    . COLONOSCOPY  2010  . DOPPLER ECHOCARDIOGRAPHY  3/14  . GUM SURGERY    . INTRAMEDULLARY (IM) NAIL INTERTROCHANTERIC Right 11/25/2019   Procedure: INTRAMEDULLARY (IM) NAIL INTERTROCHANTRIC;  Surgeon: Leandrew Koyanagi, MD;  Location: [facility];  Service: Orthopedics;  Laterality: Right;  . TEE WITHOUT CARDIOVERSION N/A 02/26/2015   Procedure: TRANSESOPHAGEAL ECHOCARDIOGRAM (TEE);  Surgeon: Gaye Pollack, MD;  Location: [facility];  Service: Open Heart Surgery;  Laterality: N/A;  . TUBAL LIGATION    . VAGINAL HYSTERECTOMY     for dysplasia    Allergies  Allergen Reactions  . Dextromethorphan Other (See Comments)    contraindication  . Pseudoephedrine Other (See Comments)    contraindication  . Penicillins Rash    Has patient had a PCN reaction causing immediate rash, facial/tongue/throat swelling, SOB or lightheadedness with hypotension: No Has patient had a PCN reaction causing severe rash involving mucus  membranes or skin necrosis: No Has patient had a PCN reaction that required hospitalization No Has patient had a PCN reaction occurring within the last 10 years: No If all of the above answers are "NO", then may proceed with Cephalosporin use.    Outpatient Encounter Medications as of 12/05/2019  Medication Sig  . aspirin 81 MG chewable tablet Chew 81 mg by mouth daily.  Marland Kitchen docusate sodium (COLACE) 100 MG capsule Take 1 capsule (100 mg total) by mouth 2 (two) times daily.  Marland Kitchen enoxaparin (LOVENOX) 40 MG/0.4ML injection Inject 0.4 mLs (40 mg total) into the skin daily.  Marland Kitchen estradiol (ESTRACE) 0.1 MG/GM vaginal cream See admin instructions. Apply topically to labia on Mondays at bedtime.  . fluticasone (FLONASE ALLERGY RELIEF) 50 MCG/ACT nasal spray Place 1 spray into both nostrils daily.   . furosemide (LASIX) 20 MG tablet Take 10 mg by mouth daily.  Marland Kitchen loratadine (CLARITIN) 10 MG tablet Take 10 mg by mouth daily as needed for allergies.   . memantine (NAMENDA) 10 MG tablet Take 1 tablet (10 mg total) by mouth 2 (two) times daily.  . Nutritional Supplement LIQD Take 120 mLs by mouth in the morning and at bedtime. MedPass  . oxyCODONE-acetaminophen (PERCOCET) 5-325 MG tablet Take 1 tablet by mouth every 8 (eight) hours as needed for up to 30 doses for severe pain.  . primidone (MYSOLINE) 250 MG tablet TAKE 1 TABLET BY MOUTH TWICE DAILY.  Marland Kitchen psyllium (REGULOID) 0.52 g capsule Take 0.52 g by mouth at bedtime.  . sennosides-docusate sodium (SENOKOT-S) 8.6-50 MG tablet Take 1 tablet by mouth every Monday, Wednesday, and Friday. QHS   No facility-administered encounter medications on file as of 12/05/2019.    Review of Systems  Unable to obtain due to dementia    Immunization History  Administered Date(s) Administered  . Hepatitis A 05/14/2016, 06/18/2016  . Hepatitis A, Ped/Adol-2 Dose 05/14/2016, 06/18/2016  . Hepatitis B 05/14/2016, 06/18/2016  . Hepatitis B, adult 11/12/2016  . Hepatitis B,  ped/adol 05/14/2016, 06/18/2016  . Influenza, High Dose Seasonal PF 06/08/2018, 07/16/2019  . Influenza, Seasonal, Injecte, Preservative Fre 05/12/2016, 10/14/2017  . Influenza,inj,Quad PF,6+ Mos 05/18/2018  . Influenza-Unspecified 05/12/2016, 10/14/2017  . PPD Test 07/06/2018  . Pneumococcal Conjugate-13 10/14/2017  . Pneumococcal Polysaccharide-23 10/12/2017  . Rabies, IM 01/22/2018, 01/25/2018, 01/29/2018, 02/05/2018  . Tdap 04/22/2015, 07/06/2017  . Zoster 09/10/2016   Pertinent  Health Maintenance Due  Topic Date Due  . COLONOSCOPY  10/23/2018  . MAMMOGRAM  05/12/2020  . INFLUENZA VACCINE  Completed  . DEXA SCAN  Completed  . PNA vac Low Risk Adult  Completed   Fall Risk  05/20/2018 05/18/2018 11/06/2016 11/06/2016 10/26/2016  Falls in the past year? No No No No No     Vitals:   12/05/19 1256  BP: 106/61  Pulse: 75  Resp: 16  Temp: 97.9 F (36.6 C)  TempSrc: Oral  Weight: 141 lb 9.6 oz (64.2 kg)  Height: 5\' 7"  (1.702 m)  Body mass index is 22.18 kg/m.  Physical Exam  GENERAL APPEARANCE: Well nourished. In no acute distress. Normal body habitus SKIN:  Right thigh surgical wound with staples, dry and no erythema MOUTH and THROAT: Lips are without lesions. Oral mucosa is moist and without lesions. Tongue is normal in shape, size, and color and without lesions RESPIRATORY: Breathing is even & unlabored, BS CTAB CARDIAC: RRR, no murmur,no extra heart sounds, RLE 2+ edema GI: Abdomen soft, normal BS, no masses, no tenderness EXTREMITIES:  Able to move X 4 extremities NEUROLOGICAL: There is no tremor. Speech is clear. Alert to self, disoriented to time and place PSYCHIATRIC:  Affect and behavior are appropriate  Labs reviewed: Recent Labs    08/23/19 2105 08/24/19 0500 08/25/19 0321 08/25/19 0321 08/27/19 0211 08/27/19 0211 08/28/19 0240 11/25/19 1136 11/26/19 0339 11/27/19 0254 11/28/19 0425  NA  --    < > 140   < > 143   < > 144   < > 138 138 139  K  --     < > 3.4*   < > 3.3*   < > 4.3   < > 3.9 3.5 3.4*  CL  --    < > 104   < > 106   < > 107   < > 102 102 100  CO2  --    < > 19*   < > 30   < > 28   < > 27 26 28   GLUCOSE  --    < > 89   < > 113*   < > 88   < > 131* 133* 108*  BUN  --    < > 16   < > 22   < > 22   < > 20 13 19   CREATININE  --    < > 0.64   < > 0.57   < > 0.54   < > 0.67 0.70 0.69  CALCIUM  --    < > 8.2*   < > 8.2*   < > 8.2*   < > 8.2* 8.1* 8.1*  MG 1.9   < > 1.7  --  2.2  --  2.0  --   --   --   --   PHOS 2.9  --   --   --   --   --   --   --   --   --   --    < > = values in this interval not displayed.   Recent Labs    08/25/19 0321 08/27/19 0211 08/28/19 0240  AST 34 33 37  ALT 17 19 20   ALKPHOS 104 99 94  BILITOT 1.1 0.6 0.4  PROT 5.9* 6.6 6.1*  ALBUMIN 3.0* 3.0* 2.9*   Recent Labs    08/27/19 0211 08/27/19 0211 08/28/19 0240 08/28/19 0240 11/25/19 1136 11/26/19 0339 11/27/19 0254 11/28/19 0425 11/29/19 0222  WBC 4.2   < > 4.4   < > 5.2   < > 7.3 5.7 5.8  NEUTROABS 2.5  --  2.8  --  4.2  --   --   --   --   HGB 12.1   < > 11.4*   < > 10.3*   < > 7.6* 7.2* 8.2*  HCT 37.9   < > 35.9*   < > 33.0*   < > 23.5* 22.5* 25.6*  MCV 95.9   < > 96.5   < > 98.5   < >  94.8 97.4 94.5  PLT 187   < > 194   < > 161   < > 141* 177 198   < > = values in this interval not displayed.   Lab Results  Component Value Date   TSH 1.358 05/03/2018   Lab Results  Component Value Date   HGBA1C 5.5 02/22/2015   Lab Results  Component Value Date   CHOL 189 11/09/2016   HDL 76 11/09/2016   LDLCALC 93 11/09/2016   TRIG 100 11/09/2016   CHOLHDL 2.5 11/09/2016    Significant Diagnostic Results in last 30 days:  DG Ankle Complete Right  Result Date: 11/25/2019 CLINICAL DATA:  Fall EXAM: RIGHT ANKLE - COMPLETE 3+ VIEW COMPARISON:  None. FINDINGS: Osteopenia. There is no evidence of fracture, dislocation, or joint effusion. There is no evidence of arthropathy or other focal bone abnormality. Diffuse soft tissue edema.  IMPRESSION: 1.  No fracture or dislocation of the right ankle. 2.  Osteopenia. 3.  Diffuse soft tissue edema. Electronically Signed   By: Eddie Candle M.D.   On: 11/25/2019 12:29   CT HEAD WO CONTRAST  Result Date: 11/25/2019 CLINICAL DATA:  Fall, head trauma EXAM: CT HEAD WITHOUT CONTRAST CT CERVICAL SPINE WITHOUT CONTRAST TECHNIQUE: Multidetector CT imaging of the head and cervical spine was performed following the standard protocol without intravenous contrast. Multiplanar CT image reconstructions of the cervical spine were also generated. COMPARISON:  07/25/2019 FINDINGS: CT HEAD FINDINGS Brain: No evidence of acute infarction, hemorrhage, hydrocephalus, extra-axial collection or mass lesion/mass effect. Mild subcortical white matter and periventricular small vessel ischemic changes. Vascular: Mild intracranial atherosclerosis. Skull: Normal. Negative for fracture or focal lesion. Sinuses/Orbits: Mild mucosal thickening in the bilateral maxillary sinuses. Minimal partial opacification of the right ethmoid air cells. Visualized paranasal sinuses and mastoid air cells are otherwise clear. Other: None. CT CERVICAL SPINE FINDINGS Alignment: Normal cervical lordosis. Skull base and vertebrae: No acute fracture. No primary bone lesion or focal pathologic process. Soft tissues and spinal canal: No prevertebral fluid or swelling. No visible canal hematoma. Disc levels: Mild degenerative changes of the mid/lower cervical spine. Spinal canal is patent. Upper chest: Visualized lung apices are clear. Other: Visualized thyroid is unremarkable. IMPRESSION: No evidence of acute intracranial abnormality. Mild small vessel ischemic changes. No evidence of traumatic injury to the cervical spine. Mild degenerative changes. Electronically Signed   By: Julian Hy M.D.   On: 11/25/2019 12:13   CT CERVICAL SPINE WO CONTRAST  Result Date: 11/25/2019 CLINICAL DATA:  Fall, head trauma EXAM: CT HEAD WITHOUT CONTRAST CT  CERVICAL SPINE WITHOUT CONTRAST TECHNIQUE: Multidetector CT imaging of the head and cervical spine was performed following the standard protocol without intravenous contrast. Multiplanar CT image reconstructions of the cervical spine were also generated. COMPARISON:  07/25/2019 FINDINGS: CT HEAD FINDINGS Brain: No evidence of acute infarction, hemorrhage, hydrocephalus, extra-axial collection or mass lesion/mass effect. Mild subcortical white matter and periventricular small vessel ischemic changes. Vascular: Mild intracranial atherosclerosis. Skull: Normal. Negative for fracture or focal lesion. Sinuses/Orbits: Mild mucosal thickening in the bilateral maxillary sinuses. Minimal partial opacification of the right ethmoid air cells. Visualized paranasal sinuses and mastoid air cells are otherwise clear. Other: None. CT CERVICAL SPINE FINDINGS Alignment: Normal cervical lordosis. Skull base and vertebrae: No acute fracture. No primary bone lesion or focal pathologic process. Soft tissues and spinal canal: No prevertebral fluid or swelling. No visible canal hematoma. Disc levels: Mild degenerative changes of the mid/lower cervical spine. Spinal canal is patent.  Upper chest: Visualized lung apices are clear. Other: Visualized thyroid is unremarkable. IMPRESSION: No evidence of acute intracranial abnormality. Mild small vessel ischemic changes. No evidence of traumatic injury to the cervical spine. Mild degenerative changes. Electronically Signed   By: Julian Hy M.D.   On: 11/25/2019 12:13   DG Chest Port 1 View  Result Date: 11/25/2019 CLINICAL DATA:  Hip fracture. EXAM: PORTABLE CHEST 1 VIEW COMPARISON:  08/23/2019 FINDINGS: Stable changes from prior cardiac surgery and valve replacement. With cardiac silhouette is normal in size. No mediastinal or hilar masses. Clear lungs.  No pleural effusion or pneumothorax. Skeletal structures are grossly intact. IMPRESSION: No acute cardiopulmonary disease.  Electronically Signed   By: Lajean Manes M.D.   On: 11/25/2019 13:34   DG C-Arm 1-60 Min  Result Date: 11/25/2019 CLINICAL DATA:  Imaging provided for ORIF for a right proximal femur fracture. EXAM: DG C-ARM 1-60 MIN; RIGHT FEMUR 2 VIEWS COMPARISON:  11/25/2019 at 12:02 p.m. FINDINGS: Submitted images show placement an intramedullary rod supporting 2 compression screws, reducing the primary intertrochanteric fracture components into near anatomic alignment. The orthopedic hardware appears well seated. IMPRESSION: Well aligned proximal right femur fracture following ORIF. Electronically Signed   By: Lajean Manes M.D.   On: 11/25/2019 17:18   DG Hip Unilat With Pelvis 2-3 Views Right  Result Date: 11/25/2019 CLINICAL DATA:  Fall, pain and deformity EXAM: DG HIP (WITH OR WITHOUT PELVIS) 2-3V RIGHT COMPARISON:  None. FINDINGS: There is a comminuted, displaced intratrochanteric fracture of the right hip with displaced fragments of the greater and lesser trochanters. No other fracture or dislocation of the pelvis or proximal left femur as included. Osteopenia. Large burden of stool and stool balls in the colon and rectum. IMPRESSION: Comminuted, displaced intratrochanteric fracture of the right hip. Electronically Signed   By: Eddie Candle M.D.   On: 11/25/2019 12:27   DG FEMUR, MIN 2 VIEWS RIGHT  Result Date: 11/25/2019 CLINICAL DATA:  Imaging provided for ORIF for a right proximal femur fracture. EXAM: DG C-ARM 1-60 MIN; RIGHT FEMUR 2 VIEWS COMPARISON:  11/25/2019 at 12:02 p.m. FINDINGS: Submitted images show placement an intramedullary rod supporting 2 compression screws, reducing the primary intertrochanteric fracture components into near anatomic alignment. The orthopedic hardware appears well seated. IMPRESSION: Well aligned proximal right femur fracture following ORIF. Electronically Signed   By: Lajean Manes M.D.   On: 11/25/2019 17:18    Assessment/Plan  1. Closed nondisplaced  intertrochanteric fracture of right femur with delayed healing, subsequent encounter - S/P ORIF with IM nail on 11/25/19, continue Percocet 5- 25 mg 1 tab every 8 hours PRN for pain and Lovenox 40 mg SQ daily till 12/09/19 for DVT prophylaxis -WBAT -Continue PT and OT for therapeutic strengthening exercises -Fall precautions  2. Anemia, blood loss Lab Results  Component Value Date   HGB 8.2 (L) 11/29/2019  - S/P transfusion of 1 unit PRBC and IV Feraheme on 11/28/2019 -Awaiting results of repeat CBC  3. Seizure disorder (HCC) - no recent seizures, continue Primidone 250 mg 1 tab daily  4. Seasonal allergic rhinitis due to pollen - stable, still 50 mcg 1 spray into each nostril daily  5. Lower extremity edema - RLE 2+edema, continue Lasix 20 mg 1 PM/2  = 10 mg tab, daily - ordered venous Doppler ultrasound  to rule out DVT   Family/ staff Communication:  Discussed plan of care with resident and charge nurse.  Labs/tests ordered: Venous Doppler ultrasound of RLE  Goals  of care:   Short-term care   Durenda Age, DNP, FNP-BC [facility] [phone] (Monday-Friday 8:00 a.m. - 5:00 p.m.) [phone] (after hours)

## 2019-12-06 ENCOUNTER — Encounter: Payer: Self-pay | Admitting: Certified Nurse Midwife

## 2019-12-08 ENCOUNTER — Ambulatory Visit (INDEPENDENT_AMBULATORY_CARE_PROVIDER_SITE_OTHER): Payer: PPO | Admitting: Orthopaedic Surgery

## 2019-12-08 ENCOUNTER — Non-Acute Institutional Stay (SKILLED_NURSING_FACILITY): Payer: PPO | Admitting: Adult Health

## 2019-12-08 ENCOUNTER — Encounter: Payer: Self-pay | Admitting: Orthopaedic Surgery

## 2019-12-08 ENCOUNTER — Ambulatory Visit (INDEPENDENT_AMBULATORY_CARE_PROVIDER_SITE_OTHER): Payer: PPO

## 2019-12-08 ENCOUNTER — Other Ambulatory Visit: Payer: Self-pay

## 2019-12-08 DIAGNOSIS — M25572 Pain in left ankle and joints of left foot: Secondary | ICD-10-CM

## 2019-12-08 DIAGNOSIS — R600 Localized edema: Secondary | ICD-10-CM | POA: Diagnosis not present

## 2019-12-08 DIAGNOSIS — M25551 Pain in right hip: Secondary | ICD-10-CM | POA: Diagnosis not present

## 2019-12-08 DIAGNOSIS — J301 Allergic rhinitis due to pollen: Secondary | ICD-10-CM | POA: Diagnosis not present

## 2019-12-08 DIAGNOSIS — S72144S Nondisplaced intertrochanteric fracture of right femur, sequela: Secondary | ICD-10-CM

## 2019-12-08 DIAGNOSIS — D500 Iron deficiency anemia secondary to blood loss (chronic): Secondary | ICD-10-CM

## 2019-12-08 DIAGNOSIS — F0390 Unspecified dementia without behavioral disturbance: Secondary | ICD-10-CM

## 2019-12-08 DIAGNOSIS — G40909 Epilepsy, unspecified, not intractable, without status epilepticus: Secondary | ICD-10-CM | POA: Diagnosis not present

## 2019-12-08 DIAGNOSIS — S72141D Displaced intertrochanteric fracture of right femur, subsequent encounter for closed fracture with routine healing: Secondary | ICD-10-CM

## 2019-12-08 MED ORDER — FERROUS SULFATE 324 (65 FE) MG PO TBEC: 1.0000 | DELAYED_RELEASE_TABLET | Freq: Every day | ORAL | 0 refills | Status: AC

## 2019-12-08 MED ORDER — OXYCODONE-ACETAMINOPHEN 5-325 MG PO TABS: 1.0000 | ORAL_TABLET | Freq: Two times a day (BID) | ORAL | 0 refills | Status: AC | PRN

## 2019-12-08 MED ORDER — SENNOSIDES-DOCUSATE SODIUM 8.6-50 MG PO TABS: 2.0000 | ORAL_TABLET | Freq: Two times a day (BID) | ORAL | 0 refills | Status: AC

## 2019-12-08 NOTE — Progress Notes (Signed)
 Post-Op Visit Note   Patient: Nicole Hess           Date of Birth: 08/11/1946           MRN: [id number] Visit Date: 12/08/2019 PCP: Tripp, Henry, MD   Assessment & Plan:  Chief Complaint:  Chief Complaint  Patient presents with  . Left Ankle - Pain   Visit Diagnoses:  1. Closed displaced intertrochanteric fracture of right femur with routine healing, subsequent encounter   2. Pain in left ankle and joints of left foot   3. Pain in right hip     Plan: Nicole Hess is 2 weeks status post surgical repair of right intertrochanteric fracture.  She is doing well reports no pain.  She is here today with her caregiver from the facility.  Surgical incisions are all healed without any signs of infection.  Moderate pitting edema in the entire extremity.  Neurovascular intact.  Painless gentle range of motion of the hip.  We remove the staples today and apply Steri-Strips.  She will need to continue with physical therapy for mobilization and strengthening.  We will check her again in about 4 weeks with two-view x-rays of the right hip.  Follow-Up Instructions: Return in about 4 weeks (around 01/05/2020).   Orders:  Orders Placed This Encounter  Procedures  . XR Ankle Complete Left  . XR FEMUR, MIN 2 VIEWS RIGHT   No orders of the defined types were placed in this encounter.   Imaging: XR Ankle Complete Left  Result Date: 12/08/2019 No acute or structural abnormalities  XR FEMUR, MIN 2 VIEWS RIGHT  Result Date: 12/08/2019 No acute or structural abnormalities   PMFS History: Patient Active Problem List   Diagnosis Date Noted  . Anemia, blood loss 11/30/2019  . Intertrochanteric fracture of right hip (HCC)   . Hypocalcemia 08/23/2019  . COVID-19 virus infection 08/23/2019  . Hypokalemia 08/23/2019  . Overactive bladder 05/20/2018  . Seasonal allergic rhinitis due to pollen 05/20/2018  . Vertigo 04/29/2017  . Memory difficulty 04/29/2017  . Dementia without behavioral  disturbance (HCC) 02/06/2017  . Epilepsy, generalized, nonconvulsive (HCC) 11/09/2016  . History of colonic polyps 11/09/2016  . S/P AVR (aortic valve replacement) 04/02/2015  . Aortic stenosis due to bicuspid aortic valve 02/26/2015  . Severe aortic stenosis 05/02/2014  . Precordial pain 01/26/2013  . Depression 03/30/2012  . Hyperlipidemia 03/30/2012   Past Medical History:  Diagnosis Date  . Abnormal Pap smear   . Anemia   . Anxiety   . Aortic stenosis   . Bicuspid aortic valve   . Coronary artery disease   . Depression   . Gout   . Heart murmur   . Heart valve problem    will have follow up Echo-Dr Jordan-will have replacement in a couple of years  . Leukopenia    Seen by Dr. Ha, present since 2008, watchful waiting  . Memory difficulty 04/29/2017  . Osteopenia   . Seizures (HCC)    as a child  . Shortness of breath dyspnea   . Vertigo 04/29/2017    Family History  Problem Relation Age of Onset  . Multiple myeloma Mother   . Stroke Father   . Hypertension Father   . Heart disease Father 70  . Kidney disease Father   . Cancer Father   . Cancer Maternal Grandmother        renal  . Osteoarthritis Brother     Past Surgical History:  Procedure Laterality   Date  . AORTIC VALVE REPLACEMENT N/A 02/26/2015   Procedure: AORTIC VALVE REPLACEMENT (AVR);  Surgeon: Bryan K Bartle, MD;  Location: [facility] OR;  Service: Open Heart Surgery;  Laterality: N/A;  . CARDIAC CATHETERIZATION N/A 01/29/2015   Procedure: Right/Left Heart Cath and Coronary Angiography;  Surgeon: Peter M Jordan, MD;  Location: [facility] INVASIVE [facility];  Service: Cardiovascular;  Laterality: N/A;  . CERVICAL CONE BIOPSY    . COLONOSCOPY  2010  . DOPPLER ECHOCARDIOGRAPHY  3/14  . GUM SURGERY    . INTRAMEDULLARY (IM) NAIL INTERTROCHANTERIC Right 11/25/2019   Procedure: INTRAMEDULLARY (IM) NAIL INTERTROCHANTRIC;  Surgeon: ,  M, MD;  Location: [facility] OR;  Service: Orthopedics;  Laterality: Right;  . TEE WITHOUT  CARDIOVERSION N/A 02/26/2015   Procedure: TRANSESOPHAGEAL ECHOCARDIOGRAM (TEE);  Surgeon: Bryan K Bartle, MD;  Location: [facility] OR;  Service: Open Heart Surgery;  Laterality: N/A;  . TUBAL LIGATION    . VAGINAL HYSTERECTOMY     for dysplasia   Social History   Occupational History  . Occupation: paralegal- Retired  Tobacco Use  . Smoking status: Never Smoker  . Smokeless tobacco: Never Used  Substance and Sexual Activity  . Alcohol use: Not Currently  . Drug use: No  . Sexual activity: Not on file    Comment: [facility]    

## 2019-12-08 NOTE — Progress Notes (Addendum)
Location:  [facility] Room Number: 315 A Place of Service:  SNF (31) Provider:  Durenda Age, DNP, FNP-BC  Patient Care Team: Reymundo Poll, MD as PCP - General (Family Medicine) Martinique, Peter M, MD as Consulting Physician (Cardiology)  Extended Emergency Contact Information Primary Emergency Contact: Peter Minium, [place] [address] Johnnette Litter of [place] Phone: [phone] Relation: Friend Secondary Emergency Contact: Orie Fisherman, [place] of [place] Phone: [phone] Relation: Relative  Code Status:  DNR  Goals of care: Advanced Directive information Advanced Directives 11/30/2019  Does Patient Have a Medical Advance Directive? Yes  Type of Advance Directive Living will;Healthcare Power of Attorney  Does patient want to make changes to medical advance directive? No - Patient declined  Copy of [place] in Chart? Yes - validated most recent copy scanned in chart (See row information)  Would patient like information on creating a medical advance directive? -     Chief Complaint  Patient presents with  . Discharge Note    For discharge to ALF on 12/12/19    HPI:  Pt is a 74 y.o. female who is for discharge back to ALF on 12/12/19 with Home health PT, OT and Nurse for medication management.  She was admitted to [facility] on 11/29/19 post hospitalization 11/25/19 to 11/29/19 S/P mechanical fall sustaining right intertrochanteric hip fracture for which she had IM nail on 11/25/19. She is a resident of a memory care unit.   She followed up with orthopedics today and staples were discontinued. She will follow up again with orthopedics in 4 weeks.  Patient was admitted to this facility for short-term rehabilitation after the patient's recent hospitalization.  Patient has completed SNF rehabilitation and therapy has cleared the patient for discharge.    Past Medical History:  Diagnosis Date  . Abnormal Pap smear   . Anemia   . Anxiety   . Aortic stenosis   . Bicuspid aortic valve   . Coronary artery disease   . Depression   . Gout   . Heart murmur   . Heart valve problem    will have follow up Echo-Dr Jordan-will have replacement in a couple of years  . Leukopenia    Seen by Dr. Lamonte Sakai, present since 2008, watchful waiting  . Memory difficulty 04/29/2017  . Osteopenia   . Seizures ([place])    as a child  . Shortness of breath dyspnea   . Vertigo 04/29/2017   Past Surgical History:  Procedure Laterality Date  . AORTIC VALVE REPLACEMENT N/A 02/26/2015   Procedure: AORTIC VALVE REPLACEMENT (AVR);  Surgeon: Gaye Pollack, MD;  Location: [facility];  Service: Open Heart Surgery;  Laterality: N/A;  . CARDIAC CATHETERIZATION N/A 01/29/2015   Procedure: Right/Left Heart Cath and Coronary Angiography;  Surgeon: Peter M Martinique, MD;  Location: [facility] CV LAB;  Service: Cardiovascular;  Laterality: N/A;  . CERVICAL CONE BIOPSY    . COLONOSCOPY  2010  . DOPPLER ECHOCARDIOGRAPHY  3/14  . GUM SURGERY    . INTRAMEDULLARY (IM) NAIL INTERTROCHANTERIC Right 11/25/2019   Procedure: INTRAMEDULLARY (IM) NAIL INTERTROCHANTRIC;  Surgeon: Leandrew Koyanagi, MD;  Location: [facility];  Service: Orthopedics;  Laterality: Right;  . TEE WITHOUT CARDIOVERSION N/A 02/26/2015   Procedure: TRANSESOPHAGEAL ECHOCARDIOGRAM (TEE);  Surgeon: Gaye Pollack, MD;  Location: [facility] OR;  Service: Open Heart  Surgery;  Laterality: N/A;  . TUBAL LIGATION    . VAGINAL HYSTERECTOMY     for dysplasia    Allergies  Allergen Reactions  . Dextromethorphan Other (See Comments)    contraindication  . Pseudoephedrine Other (See Comments)    contraindication  . Penicillins Rash    Has patient had a PCN reaction causing immediate rash, facial/tongue/throat swelling, SOB or lightheadedness with hypotension: No Has patient had a PCN reaction causing severe rash involving mucus membranes or skin  necrosis: No Has patient had a PCN reaction that required hospitalization No Has patient had a PCN reaction occurring within the last 10 years: No If all of the above answers are "NO", then may proceed with Cephalosporin use.    Outpatient Encounter Medications as of 12/08/2019  Medication Sig  . aspirin 81 MG chewable tablet Chew 81 mg by mouth daily.  Marland Kitchen enoxaparin (LOVENOX) 40 MG/0.4ML injection Inject 0.4 mLs (40 mg total) into the skin daily.  Marland Kitchen estradiol (ESTRACE) 0.1 MG/GM vaginal cream See admin instructions. Apply topically to labia on Mondays at bedtime.  . ferrous sulfate 324 (65 Fe) MG TBEC Take 1 tablet (325 mg total) by mouth daily.  . fluticasone (FLONASE ALLERGY RELIEF) 50 MCG/ACT nasal spray Place 1 spray into both nostrils daily.   . furosemide (LASIX) 20 MG tablet Take 10 mg by mouth daily.  Marland Kitchen loratadine (CLARITIN) 10 MG tablet Take 10 mg by mouth daily as needed for allergies.   . memantine (NAMENDA) 10 MG tablet Take 1 tablet (10 mg total) by mouth 2 (two) times daily.  . Nutritional Supplement LIQD Take 120 mLs by mouth in the morning and at bedtime. MedPass  . oxyCODONE-acetaminophen (PERCOCET/ROXICET) 5-325 MG tablet Take 1 tablet by mouth 2 (two) times daily as needed for severe pain.  . primidone (MYSOLINE) 250 MG tablet TAKE 1 TABLET BY MOUTH TWICE DAILY.  Marland Kitchen psyllium (REGULOID) 0.52 g capsule Take 0.52 g by mouth at bedtime.  . senna-docusate (SENNA S) 8.6-50 MG tablet Take 2 tablets by mouth 2 (two) times daily.  . [DISCONTINUED] docusate sodium (COLACE) 100 MG capsule Take 1 capsule (100 mg total) by mouth 2 (two) times daily.  . [DISCONTINUED] oxyCODONE-acetaminophen (PERCOCET) 5-325 MG tablet Take 1 tablet by mouth every 8 (eight) hours as needed for up to 30 doses for severe pain.  . [DISCONTINUED] sennosides-docusate sodium (SENOKOT-S) 8.6-50 MG tablet Take 1 tablet by mouth every Monday, Wednesday, and Friday. QHS   No facility-administered encounter  medications on file as of 12/08/2019.    Review of Systems  Unable to obtain due to dementia   Immunization History  Administered Date(s) Administered  . Hepatitis A 05/14/2016, 06/18/2016  . Hepatitis A, Ped/Adol-2 Dose 05/14/2016, 06/18/2016  . Hepatitis B 05/14/2016, 06/18/2016  . Hepatitis B, adult 11/12/2016  . Hepatitis B, ped/adol 05/14/2016, 06/18/2016  . Influenza, High Dose Seasonal PF 06/08/2018, 07/16/2019  . Influenza, Seasonal, Injecte, Preservative Fre 05/12/2016, 10/14/2017  . Influenza,inj,Quad PF,6+ Mos 05/18/2018  . Influenza-Unspecified 05/12/2016, 10/14/2017  . PPD Test 07/06/2018  . Pneumococcal Conjugate-13 10/14/2017  . Pneumococcal Polysaccharide-23 10/12/2017  . Rabies, IM 01/22/2018, 01/25/2018, 01/29/2018, 02/05/2018  . Tdap 04/22/2015, 07/06/2017  . Zoster 09/10/2016   Pertinent  Health Maintenance Due  Topic Date Due  . COLONOSCOPY  10/23/2018  . MAMMOGRAM  05/12/2020  . INFLUENZA VACCINE  Completed  . DEXA SCAN  Completed  . PNA vac Low Risk Adult  Completed   Fall Risk  05/20/2018 05/18/2018 11/06/2016  11/06/2016 10/26/2016  Falls in the past year? No No No No No     Vitals:   12/08/19 1747  BP: 111/61  Pulse: 81  Resp: 18  Temp: 97.9 F (36.6 C)  Weight: 142 lb (64.4 kg)  Height: 5\' 7"  (1.702 m)   Body mass index is 22.24 kg/m.  Physical Exam  GENERAL APPEARANCE: Well nourished. In no acute distress. Normal body habitus SKIN:  Right thigh surgical incision, healed, no redness MOUTH and THROAT: Lips are without lesions. Oral mucosa is moist and without lesions. Tongue is normal in shape, size, and color and without lesions RESPIRATORY: Breathing is even & unlabored, BS CTAB CARDIAC: RRR, no murmur,no extra heart sounds, RLE 2+ edema and LLE trace edema GI: Abdomen soft, normal BS, no masses, no tenderness EXTREMITIES:  Able to move X 4 extremities NEUROLOGICAL: There is no tremor. Speech is clear. Alert to self, disoriented to time  and place. PSYCHIATRIC:  Affect and behavior are appropriate  Labs reviewed:  12/06/19 WBC 6.4 hemoglobin 8.4 hematocrit 25.5 MCV 95.4 platelet 395 glucose 100 calcium 8.3 creatinine 0.58 BUN 29.0 sodium 142 K4.0 GFR >90 Recent Labs    08/23/19 2105 08/24/19 0500 08/25/19 0321 08/25/19 0321 08/27/19 0211 08/27/19 0211 08/28/19 0240 11/25/19 1136 11/26/19 0339 11/27/19 0254 11/28/19 0425  NA  --    < > 140   < > 143   < > 144   < > 138 138 139  K  --    < > 3.4*   < > 3.3*   < > 4.3   < > 3.9 3.5 3.4*  CL  --    < > 104   < > 106   < > 107   < > 102 102 100  CO2  --    < > 19*   < > 30   < > 28   < > 27 26 28   GLUCOSE  --    < > 89   < > 113*   < > 88   < > 131* 133* 108*  BUN  --    < > 16   < > 22   < > 22   < > 20 13 19   CREATININE  --    < > 0.64   < > 0.57   < > 0.54   < > 0.67 0.70 0.69  CALCIUM  --    < > 8.2*   < > 8.2*   < > 8.2*   < > 8.2* 8.1* 8.1*  MG 1.9   < > 1.7  --  2.2  --  2.0  --   --   --   --   PHOS 2.9  --   --   --   --   --   --   --   --   --   --    < > = values in this interval not displayed.   Recent Labs    08/25/19 0321 08/27/19 0211 08/28/19 0240  AST 34 33 37  ALT 17 19 20   ALKPHOS 104 99 94  BILITOT 1.1 0.6 0.4  PROT 5.9* 6.6 6.1*  ALBUMIN 3.0* 3.0* 2.9*   Recent Labs    08/27/19 0211 08/27/19 0211 08/28/19 0240 08/28/19 0240 11/25/19 1136 11/26/19 0339 11/27/19 0254 11/28/19 0425 11/29/19 0222  WBC 4.2   < > 4.4   < > 5.2   < > 7.3 5.7 5.8  NEUTROABS 2.5  --  2.8  --  4.2  --   --   --   --   HGB 12.1   < > 11.4*   < > 10.3*   < > 7.6* 7.2* 8.2*  HCT 37.9   < > 35.9*   < > 33.0*   < > 23.5* 22.5* 25.6*  MCV 95.9   < > 96.5   < > 98.5   < > 94.8 97.4 94.5  PLT 187   < > 194   < > 161   < > 141* 177 198   < > = values in this interval not displayed.   Lab Results  Component Value Date   TSH 1.358 05/03/2018   Lab Results  Component Value Date   HGBA1C 5.5 02/22/2015   Lab Results  Component Value Date   CHOL 189  11/09/2016   HDL 76 11/09/2016   LDLCALC 93 11/09/2016   TRIG 100 11/09/2016   CHOLHDL 2.5 11/09/2016    Significant Diagnostic Results in last 30 days:  DG Ankle Complete Right  Result Date: 11/25/2019 CLINICAL DATA:  Fall EXAM: RIGHT ANKLE - COMPLETE 3+ VIEW COMPARISON:  None. FINDINGS: Osteopenia. There is no evidence of fracture, dislocation, or joint effusion. There is no evidence of arthropathy or other focal bone abnormality. Diffuse soft tissue edema. IMPRESSION: 1.  No fracture or dislocation of the right ankle. 2.  Osteopenia. 3.  Diffuse soft tissue edema. Electronically Signed   By: Eddie Candle M.D.   On: 11/25/2019 12:29   CT HEAD WO CONTRAST  Result Date: 11/25/2019 CLINICAL DATA:  Fall, head trauma EXAM: CT HEAD WITHOUT CONTRAST CT CERVICAL SPINE WITHOUT CONTRAST TECHNIQUE: Multidetector CT imaging of the head and cervical spine was performed following the standard protocol without intravenous contrast. Multiplanar CT image reconstructions of the cervical spine were also generated. COMPARISON:  07/25/2019 FINDINGS: CT HEAD FINDINGS Brain: No evidence of acute infarction, hemorrhage, hydrocephalus, extra-axial collection or mass lesion/mass effect. Mild subcortical white matter and periventricular small vessel ischemic changes. Vascular: Mild intracranial atherosclerosis. Skull: Normal. Negative for fracture or focal lesion. Sinuses/Orbits: Mild mucosal thickening in the bilateral maxillary sinuses. Minimal partial opacification of the right ethmoid air cells. Visualized paranasal sinuses and mastoid air cells are otherwise clear. Other: None. CT CERVICAL SPINE FINDINGS Alignment: Normal cervical lordosis. Skull base and vertebrae: No acute fracture. No primary bone lesion or focal pathologic process. Soft tissues and spinal canal: No prevertebral fluid or swelling. No visible canal hematoma. Disc levels: Mild degenerative changes of the mid/lower cervical spine. Spinal canal is patent.  Upper chest: Visualized lung apices are clear. Other: Visualized thyroid is unremarkable. IMPRESSION: No evidence of acute intracranial abnormality. Mild small vessel ischemic changes. No evidence of traumatic injury to the cervical spine. Mild degenerative changes. Electronically Signed   By: Julian Hy M.D.   On: 11/25/2019 12:13   CT CERVICAL SPINE WO CONTRAST  Result Date: 11/25/2019 CLINICAL DATA:  Fall, head trauma EXAM: CT HEAD WITHOUT CONTRAST CT CERVICAL SPINE WITHOUT CONTRAST TECHNIQUE: Multidetector CT imaging of the head and cervical spine was performed following the standard protocol without intravenous contrast. Multiplanar CT image reconstructions of the cervical spine were also generated. COMPARISON:  07/25/2019 FINDINGS: CT HEAD FINDINGS Brain: No evidence of acute infarction, hemorrhage, hydrocephalus, extra-axial collection or mass lesion/mass effect. Mild subcortical white matter and periventricular small vessel ischemic changes. Vascular: Mild intracranial atherosclerosis. Skull: Normal. Negative for fracture or focal lesion. Sinuses/Orbits: Mild  mucosal thickening in the bilateral maxillary sinuses. Minimal partial opacification of the right ethmoid air cells. Visualized paranasal sinuses and mastoid air cells are otherwise clear. Other: None. CT CERVICAL SPINE FINDINGS Alignment: Normal cervical lordosis. Skull base and vertebrae: No acute fracture. No primary bone lesion or focal pathologic process. Soft tissues and spinal canal: No prevertebral fluid or swelling. No visible canal hematoma. Disc levels: Mild degenerative changes of the mid/lower cervical spine. Spinal canal is patent. Upper chest: Visualized lung apices are clear. Other: Visualized thyroid is unremarkable. IMPRESSION: No evidence of acute intracranial abnormality. Mild small vessel ischemic changes. No evidence of traumatic injury to the cervical spine. Mild degenerative changes. Electronically Signed   By: Julian Hy M.D.   On: 11/25/2019 12:13   DG Chest Port 1 View  Result Date: 11/25/2019 CLINICAL DATA:  Hip fracture. EXAM: PORTABLE CHEST 1 VIEW COMPARISON:  08/23/2019 FINDINGS: Stable changes from prior cardiac surgery and valve replacement. With cardiac silhouette is normal in size. No mediastinal or hilar masses. Clear lungs.  No pleural effusion or pneumothorax. Skeletal structures are grossly intact. IMPRESSION: No acute cardiopulmonary disease. Electronically Signed   By: Lajean Manes M.D.   On: 11/25/2019 13:34   DG C-Arm 1-60 Min  Result Date: 11/25/2019 CLINICAL DATA:  Imaging provided for ORIF for a right proximal femur fracture. EXAM: DG C-ARM 1-60 MIN; RIGHT FEMUR 2 VIEWS COMPARISON:  11/25/2019 at 12:02 p.m. FINDINGS: Submitted images show placement an intramedullary rod supporting 2 compression screws, reducing the primary intertrochanteric fracture components into near anatomic alignment. The orthopedic hardware appears well seated. IMPRESSION: Well aligned proximal right femur fracture following ORIF. Electronically Signed   By: Lajean Manes M.D.   On: 11/25/2019 17:18   DG Hip Unilat With Pelvis 2-3 Views Right  Result Date: 11/25/2019 CLINICAL DATA:  Fall, pain and deformity EXAM: DG HIP (WITH OR WITHOUT PELVIS) 2-3V RIGHT COMPARISON:  None. FINDINGS: There is a comminuted, displaced intratrochanteric fracture of the right hip with displaced fragments of the greater and lesser trochanters. No other fracture or dislocation of the pelvis or proximal left femur as included. Osteopenia. Large burden of stool and stool balls in the colon and rectum. IMPRESSION: Comminuted, displaced intratrochanteric fracture of the right hip. Electronically Signed   By: Eddie Candle M.D.   On: 11/25/2019 12:27   DG FEMUR, MIN 2 VIEWS RIGHT  Result Date: 11/25/2019 CLINICAL DATA:  Imaging provided for ORIF for a right proximal femur fracture. EXAM: DG C-ARM 1-60 MIN; RIGHT FEMUR 2 VIEWS COMPARISON:   11/25/2019 at 12:02 p.m. FINDINGS: Submitted images show placement an intramedullary rod supporting 2 compression screws, reducing the primary intertrochanteric fracture components into near anatomic alignment. The orthopedic hardware appears well seated. IMPRESSION: Well aligned proximal right femur fracture following ORIF. Electronically Signed   By: Lajean Manes M.D.   On: 11/25/2019 17:18   XR Ankle Complete Left  Result Date: 12/08/2019 No acute or structural abnormalities  XR FEMUR, MIN 2 VIEWS RIGHT  Result Date: 12/08/2019 No acute or structural abnormalities   Assessment/Plan  1. Closed nondisplaced intertrochanteric fracture of right femur, sequela - S/P follow up with orthopedics today and removed staples, surgical wound healed - follow up with orthopedics, Dr. Frankey Shown,  in 4 weeks -We will decrease Percocet 5-325 mg from every 8 hours PRN to BID PRN for pain -Continue Lovenox 40 mg  SQ daily until 12/09/2019 for DVT prophylaxis -For the Home health PT and OT, for therapeutic strengthening  exercises  2. Seizure disorder () -No recent seizures, continue primidone 250 mg 1 tab twice a day  3. Anemia, blood loss - S/P transfusion of 1 unit packed RBC and IV Feraheme on 11/28/2019 - hgb 8.4, 12/06/19 - will start on FeSO4 325 mg 1 tab daily - CBC in 1 week  4. Lower extremity edema -Continue Lasix 20 1/2 tab = 10 mg daily  5. Seasonal allergic rhinitis due to pollen -Continue loratadine 10 mg 1 tab PRN and fluticasone 50 mcg spray 1 spray in each nostril daily  6. Dementia without behavioral disturbance, unspecified dementia type (HCC) -Continue memantine 10 mg 1 tab twice a day, fall precautions     I have filled out patient's discharge paperwork.   Patient will have home health PT, OT and Nurse.  DME provided:  Rolling walker  Total discharge time: Greater than 30 minutes Greater than 50% was spent in counseling and coordination of care.   Discharge time  involved coordination of the discharge process with social worker, nursing staff and therapy department. Medical justification for home health services/DME verified.   Durenda Age, DNP, FNP-BC [facility] [phone] (Monday-Friday 8:00 a.m. - 5:00 p.m.) [phone] (after hours)

## 2019-12-13 DIAGNOSIS — I2510 Atherosclerotic heart disease of native coronary artery without angina pectoris: Secondary | ICD-10-CM | POA: Diagnosis not present

## 2019-12-13 DIAGNOSIS — S72141D Displaced intertrochanteric fracture of right femur, subsequent encounter for closed fracture with routine healing: Secondary | ICD-10-CM | POA: Diagnosis not present

## 2019-12-16 DIAGNOSIS — F0390 Unspecified dementia without behavioral disturbance: Secondary | ICD-10-CM | POA: Diagnosis not present

## 2019-12-16 DIAGNOSIS — E785 Hyperlipidemia, unspecified: Secondary | ICD-10-CM | POA: Diagnosis not present

## 2019-12-16 DIAGNOSIS — Z952 Presence of prosthetic heart valve: Secondary | ICD-10-CM | POA: Diagnosis not present

## 2019-12-16 DIAGNOSIS — R339 Retention of urine, unspecified: Secondary | ICD-10-CM | POA: Diagnosis not present

## 2019-12-16 DIAGNOSIS — Z7982 Long term (current) use of aspirin: Secondary | ICD-10-CM | POA: Diagnosis not present

## 2019-12-16 DIAGNOSIS — Z8616 Personal history of COVID-19: Secondary | ICD-10-CM | POA: Diagnosis not present

## 2019-12-16 DIAGNOSIS — M8580 Other specified disorders of bone density and structure, unspecified site: Secondary | ICD-10-CM | POA: Diagnosis not present

## 2019-12-16 DIAGNOSIS — F329 Major depressive disorder, single episode, unspecified: Secondary | ICD-10-CM | POA: Diagnosis not present

## 2019-12-16 DIAGNOSIS — G40909 Epilepsy, unspecified, not intractable, without status epilepticus: Secondary | ICD-10-CM | POA: Diagnosis not present

## 2019-12-16 DIAGNOSIS — Z8701 Personal history of pneumonia (recurrent): Secondary | ICD-10-CM | POA: Diagnosis not present

## 2019-12-16 DIAGNOSIS — J301 Allergic rhinitis due to pollen: Secondary | ICD-10-CM | POA: Diagnosis not present

## 2019-12-16 DIAGNOSIS — S72144D Nondisplaced intertrochanteric fracture of right femur, subsequent encounter for closed fracture with routine healing: Secondary | ICD-10-CM | POA: Diagnosis not present

## 2019-12-16 DIAGNOSIS — D649 Anemia, unspecified: Secondary | ICD-10-CM | POA: Diagnosis not present

## 2019-12-16 DIAGNOSIS — M109 Gout, unspecified: Secondary | ICD-10-CM | POA: Diagnosis not present

## 2019-12-16 DIAGNOSIS — I2510 Atherosclerotic heart disease of native coronary artery without angina pectoris: Secondary | ICD-10-CM | POA: Diagnosis not present

## 2019-12-18 DIAGNOSIS — G3183 Dementia with Lewy bodies: Secondary | ICD-10-CM | POA: Diagnosis not present

## 2019-12-18 DIAGNOSIS — R5381 Other malaise: Secondary | ICD-10-CM | POA: Diagnosis not present

## 2019-12-18 DIAGNOSIS — S72144D Nondisplaced intertrochanteric fracture of right femur, subsequent encounter for closed fracture with routine healing: Secondary | ICD-10-CM | POA: Diagnosis not present

## 2019-12-18 DIAGNOSIS — Z9181 History of falling: Secondary | ICD-10-CM | POA: Diagnosis not present

## 2019-12-18 DIAGNOSIS — R259 Unspecified abnormal involuntary movements: Secondary | ICD-10-CM | POA: Diagnosis not present

## 2019-12-18 DIAGNOSIS — K5901 Slow transit constipation: Secondary | ICD-10-CM | POA: Diagnosis not present

## 2019-12-25 ENCOUNTER — Telehealth: Payer: Self-pay | Admitting: Cardiology

## 2019-12-25 NOTE — Telephone Encounter (Signed)
LMOM RE: F/U Visit--- AF 

## 2020-01-01 DIAGNOSIS — R600 Localized edema: Secondary | ICD-10-CM | POA: Diagnosis not present

## 2020-01-01 DIAGNOSIS — S90811A Abrasion, right foot, initial encounter: Secondary | ICD-10-CM | POA: Diagnosis not present

## 2020-01-01 DIAGNOSIS — K5901 Slow transit constipation: Secondary | ICD-10-CM | POA: Diagnosis not present

## 2020-01-01 DIAGNOSIS — R259 Unspecified abnormal involuntary movements: Secondary | ICD-10-CM | POA: Diagnosis not present

## 2020-01-01 DIAGNOSIS — Z993 Dependence on wheelchair: Secondary | ICD-10-CM | POA: Diagnosis not present

## 2020-01-01 DIAGNOSIS — S72144D Nondisplaced intertrochanteric fracture of right femur, subsequent encounter for closed fracture with routine healing: Secondary | ICD-10-CM | POA: Diagnosis not present

## 2020-01-01 DIAGNOSIS — G3183 Dementia with Lewy bodies: Secondary | ICD-10-CM | POA: Diagnosis not present

## 2020-01-02 ENCOUNTER — Ambulatory Visit (INDEPENDENT_AMBULATORY_CARE_PROVIDER_SITE_OTHER): Payer: PPO

## 2020-01-02 ENCOUNTER — Ambulatory Visit (INDEPENDENT_AMBULATORY_CARE_PROVIDER_SITE_OTHER): Payer: PPO | Admitting: Orthopaedic Surgery

## 2020-01-02 ENCOUNTER — Encounter: Payer: Self-pay | Admitting: Orthopaedic Surgery

## 2020-01-02 ENCOUNTER — Other Ambulatory Visit: Payer: Self-pay

## 2020-01-02 DIAGNOSIS — S72141D Displaced intertrochanteric fracture of right femur, subsequent encounter for closed fracture with routine healing: Secondary | ICD-10-CM | POA: Diagnosis not present

## 2020-01-02 NOTE — Progress Notes (Signed)
Post-Op Visit Note   Patient: Nicole Hess           Date of Birth: 1946-07-10           MRN: [id number] Visit Date: 01/02/2020 PCP: Reymundo Poll, MD   Assessment & Plan:  Chief Complaint:  Chief Complaint  Patient presents with  . Right Hip - Pain, Follow-up   Visit Diagnoses:  1. Closed displaced intertrochanteric fracture of right femur with routine healing, subsequent encounter     Plan: Patient is a pleasant 74 year old female who comes in today 6 weeks out right hip IM nail from an intertrochanteric femur fracture.  She has been doing fairly well.  She is currently residing at an assisted living facility.  Examination of her right hip reveals a fully healed surgical scar without complication.  She does have trouble flexing her right hip.  Calf is soft and nontender.  She is neurovascular intact distally.  At this point, she will continue working with physical therapy.  She will follow-up with [place] in 6 weeks time for repeat evaluation and 2 view x-rays of the right femur.  Call with concerns or questions in the meantime.  Follow-Up Instructions: Return in about 6 weeks (around 02/13/2020).   Orders:  Orders Placed This Encounter  Procedures  . XR HIP UNILAT W OR W/O PELVIS 2-3 VIEWS RIGHT   No orders of the defined types were placed in this encounter.   Imaging: XR HIP UNILAT W OR W/O PELVIS 2-3 VIEWS RIGHT  Result Date: 01/02/2020 X-rays demonstrate stable alignment of the fracture without hardware complication   PMFS History: Patient Active Problem List   Diagnosis Date Noted  . Anemia, blood loss 11/30/2019  . Intertrochanteric fracture of right hip ([place])   . Hypocalcemia 08/23/2019  . COVID-19 virus infection 08/23/2019  . Hypokalemia 08/23/2019  . Overactive bladder 05/20/2018  . Seasonal allergic rhinitis due to pollen 05/20/2018  . Vertigo 04/29/2017  . Memory difficulty 04/29/2017  . Dementia without behavioral disturbance ([place]) 02/06/2017  .  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 History of colonic polyps 11/09/2016  . S/P AVR (aortic valve replacement) 04/02/2015  . Aortic stenosis due to bicuspid aortic valve 02/26/2015  . Severe aortic stenosis 05/02/2014  . Precordial pain 01/26/2013  . Depression 03/30/2012  . Hyperlipidemia 03/30/2012   Past Medical History:  Diagnosis Date  . Abnormal Pap smear   . Anemia   . Anxiety   . Aortic stenosis   . Bicuspid aortic valve   . Coronary artery disease   . Depression   . Gout   . Heart murmur   . Heart valve problem    will have follow up Echo-Dr Jordan-will have replacement in a couple of years  . Leukopenia    Seen by Dr. Lamonte Sakai, present since 2008, watchful waiting  . Memory difficulty 04/29/2017  . Osteopenia   . Seizures ([place])    as a child  . Shortness of breath dyspnea   . Vertigo 04/29/2017    Family History  Problem Relation Age of Onset  . Multiple myeloma Mother   . Stroke Father   . Hypertension Father   . Heart disease Father 25  . Kidney disease Father   . Cancer Father   . Cancer Maternal Grandmother        renal  . Osteoarthritis Brother     Past Surgical History:  Procedure Laterality Date  . AORTIC VALVE REPLACEMENT N/A 02/26/2015   Procedure:  AORTIC VALVE REPLACEMENT (AVR);  Surgeon: Gaye Pollack, MD;  Location: [facility];  Service: Open Heart Surgery;  Laterality: N/A;  . CARDIAC CATHETERIZATION N/A 01/29/2015   Procedure: Right/Left Heart Cath and Coronary Angiography;  Surgeon: Peter M Martinique, MD;  Location: [facility];  Service: Cardiovascular;  Laterality: N/A;  . CERVICAL CONE BIOPSY    . COLONOSCOPY  2010  . DOPPLER ECHOCARDIOGRAPHY  3/14  . GUM SURGERY    . INTRAMEDULLARY (IM) NAIL INTERTROCHANTERIC Right 11/25/2019   Procedure: INTRAMEDULLARY (IM) NAIL INTERTROCHANTRIC;  Surgeon: Leandrew Koyanagi, MD;  Location: [facility];  Service: Orthopedics;  Laterality: Right;  . TEE WITHOUT CARDIOVERSION N/A 02/26/2015    Procedure: TRANSESOPHAGEAL ECHOCARDIOGRAM (TEE);  Surgeon: Gaye Pollack, MD;  Location: [facility];  Service: Open Heart Surgery;  Laterality: N/A;  . TUBAL LIGATION    . VAGINAL HYSTERECTOMY     for dysplasia   Social History   Occupational History  . Occupation: Radio broadcast assistant- Retired  Tobacco Use  . Smoking status: Never Smoker  . Smokeless tobacco: Never Used  Substance and Sexual Activity  . Alcohol use: Not Currently  . Drug use: No  . Sexual activity: Not on file    Comment: [facility]

## 2020-01-05 ENCOUNTER — Ambulatory Visit: Payer: PPO | Admitting: Orthopaedic Surgery

## 2020-01-09 DIAGNOSIS — N390 Urinary tract infection, site not specified: Secondary | ICD-10-CM | POA: Diagnosis not present

## 2020-01-09 DIAGNOSIS — R945 Abnormal results of liver function studies: Secondary | ICD-10-CM | POA: Diagnosis not present

## 2020-01-11 DIAGNOSIS — Z79899 Other long term (current) drug therapy: Secondary | ICD-10-CM | POA: Diagnosis not present

## 2020-01-26 DIAGNOSIS — F0390 Unspecified dementia without behavioral disturbance: Secondary | ICD-10-CM | POA: Diagnosis not present

## 2020-01-26 DIAGNOSIS — W19XXXA Unspecified fall, initial encounter: Secondary | ICD-10-CM | POA: Diagnosis not present

## 2020-02-08 DIAGNOSIS — I2510 Atherosclerotic heart disease of native coronary artery without angina pectoris: Secondary | ICD-10-CM | POA: Diagnosis not present

## 2020-02-08 DIAGNOSIS — S72144D Nondisplaced intertrochanteric fracture of right femur, subsequent encounter for closed fracture with routine healing: Secondary | ICD-10-CM | POA: Diagnosis not present

## 2020-02-08 DIAGNOSIS — F0390 Unspecified dementia without behavioral disturbance: Secondary | ICD-10-CM | POA: Diagnosis not present

## 2020-02-13 ENCOUNTER — Ambulatory Visit: Payer: PPO | Admitting: Orthopaedic Surgery

## 2020-02-14 DIAGNOSIS — B351 Tinea unguium: Secondary | ICD-10-CM | POA: Diagnosis not present

## 2020-02-14 DIAGNOSIS — M6281 Muscle weakness (generalized): Secondary | ICD-10-CM | POA: Diagnosis not present

## 2020-02-14 DIAGNOSIS — I739 Peripheral vascular disease, unspecified: Secondary | ICD-10-CM | POA: Diagnosis not present

## 2020-03-01 DIAGNOSIS — Z79899 Other long term (current) drug therapy: Secondary | ICD-10-CM | POA: Diagnosis not present

## 2020-03-01 DIAGNOSIS — M899 Disorder of bone, unspecified: Secondary | ICD-10-CM | POA: Diagnosis not present

## 2020-03-14 DIAGNOSIS — R05 Cough: Secondary | ICD-10-CM | POA: Diagnosis not present

## 2020-03-14 DIAGNOSIS — J9811 Atelectasis: Secondary | ICD-10-CM | POA: Diagnosis not present

## 2020-03-14 DIAGNOSIS — J189 Pneumonia, unspecified organism: Secondary | ICD-10-CM | POA: Diagnosis not present

## 2020-03-14 DIAGNOSIS — G3183 Dementia with Lewy bodies: Secondary | ICD-10-CM | POA: Diagnosis not present

## 2020-03-14 DIAGNOSIS — J439 Emphysema, unspecified: Secondary | ICD-10-CM | POA: Diagnosis not present

## 2020-03-21 DIAGNOSIS — F0281 Dementia in other diseases classified elsewhere with behavioral disturbance: Secondary | ICD-10-CM | POA: Diagnosis not present

## 2020-03-21 DIAGNOSIS — I739 Peripheral vascular disease, unspecified: Secondary | ICD-10-CM | POA: Diagnosis not present

## 2020-03-21 DIAGNOSIS — G3183 Dementia with Lewy bodies: Secondary | ICD-10-CM | POA: Diagnosis not present

## 2020-03-21 DIAGNOSIS — E785 Hyperlipidemia, unspecified: Secondary | ICD-10-CM | POA: Diagnosis not present

## 2020-03-21 DIAGNOSIS — J189 Pneumonia, unspecified organism: Secondary | ICD-10-CM | POA: Diagnosis not present

## 2020-04-04 DIAGNOSIS — E46 Unspecified protein-calorie malnutrition: Secondary | ICD-10-CM | POA: Diagnosis not present

## 2020-04-04 DIAGNOSIS — R634 Abnormal weight loss: Secondary | ICD-10-CM | POA: Diagnosis not present

## 2020-04-04 DIAGNOSIS — F0390 Unspecified dementia without behavioral disturbance: Secondary | ICD-10-CM | POA: Diagnosis not present

## 2020-04-04 DIAGNOSIS — E559 Vitamin D deficiency, unspecified: Secondary | ICD-10-CM | POA: Diagnosis not present

## 2020-04-11 DIAGNOSIS — M8580 Other specified disorders of bone density and structure, unspecified site: Secondary | ICD-10-CM | POA: Diagnosis not present

## 2020-04-11 DIAGNOSIS — K5900 Constipation, unspecified: Secondary | ICD-10-CM | POA: Diagnosis not present

## 2020-04-11 DIAGNOSIS — S72144D Nondisplaced intertrochanteric fracture of right femur, subsequent encounter for closed fracture with routine healing: Secondary | ICD-10-CM | POA: Diagnosis not present

## 2020-04-11 DIAGNOSIS — Z7982 Long term (current) use of aspirin: Secondary | ICD-10-CM | POA: Diagnosis not present

## 2020-04-11 DIAGNOSIS — Z993 Dependence on wheelchair: Secondary | ICD-10-CM | POA: Diagnosis not present

## 2020-04-11 DIAGNOSIS — I2510 Atherosclerotic heart disease of native coronary artery without angina pectoris: Secondary | ICD-10-CM | POA: Diagnosis not present

## 2020-04-11 DIAGNOSIS — M109 Gout, unspecified: Secondary | ICD-10-CM | POA: Diagnosis not present

## 2020-04-11 DIAGNOSIS — F419 Anxiety disorder, unspecified: Secondary | ICD-10-CM | POA: Diagnosis not present

## 2020-04-11 DIAGNOSIS — D500 Iron deficiency anemia secondary to blood loss (chronic): Secondary | ICD-10-CM | POA: Diagnosis not present

## 2020-04-11 DIAGNOSIS — J301 Allergic rhinitis due to pollen: Secondary | ICD-10-CM | POA: Diagnosis not present

## 2020-04-11 DIAGNOSIS — Z9181 History of falling: Secondary | ICD-10-CM | POA: Diagnosis not present

## 2020-04-11 DIAGNOSIS — F0280 Dementia in other diseases classified elsewhere without behavioral disturbance: Secondary | ICD-10-CM | POA: Diagnosis not present

## 2020-04-11 DIAGNOSIS — F329 Major depressive disorder, single episode, unspecified: Secondary | ICD-10-CM | POA: Diagnosis not present

## 2020-04-11 DIAGNOSIS — Z7951 Long term (current) use of inhaled steroids: Secondary | ICD-10-CM | POA: Diagnosis not present

## 2020-04-11 DIAGNOSIS — G40909 Epilepsy, unspecified, not intractable, without status epilepticus: Secondary | ICD-10-CM | POA: Diagnosis not present

## 2020-04-15 ENCOUNTER — Other Ambulatory Visit: Payer: Self-pay

## 2020-04-15 ENCOUNTER — Emergency Department (HOSPITAL_COMMUNITY)
Admission: EM | Admit: 2020-04-15 | Discharge: 2020-04-16 | Disposition: A | Discharge status: Home / Self Care | Payer: PPO | Attending: Emergency Medicine | Admitting: Emergency Medicine

## 2020-04-15 ENCOUNTER — Encounter (HOSPITAL_COMMUNITY): Payer: Self-pay | Admitting: Emergency Medicine

## 2020-04-15 DIAGNOSIS — I2510 Atherosclerotic heart disease of native coronary artery without angina pectoris: Secondary | ICD-10-CM | POA: Diagnosis not present

## 2020-04-15 DIAGNOSIS — R21 Rash and other nonspecific skin eruption: Secondary | ICD-10-CM | POA: Diagnosis not present

## 2020-04-15 LAB — BASIC METABOLIC PANEL
Anion gap: 13 (ref 5–15)
BUN: 19 mg/dL (ref 8–23)
CO2: 26 mmol/L (ref 22–32)
Calcium: 8.5 mg/dL — ABNORMAL LOW (ref 8.9–10.3)
Chloride: 100 mmol/L (ref 98–111)
Creatinine, Ser: 0.68 mg/dL (ref 0.44–1.00)
GFR calc Af Amer: >60 mL/min (ref >60)
GFR calc non Af Amer: >60 mL/min (ref >60)
Glucose, Bld: 94 mg/dL (ref 70–99)
Potassium: 3.9 mmol/L (ref 3.5–5.1)
Sodium: 139 mmol/L (ref 135–145)

## 2020-04-15 LAB — CBC
HCT: 37.9 % (ref 36.0–46.0)
Hemoglobin: 11.7 g/dL — ABNORMAL LOW (ref 12.0–15.0)
MCH: 30.3 pg (ref 26.0–34.0)
MCHC: 30.9 g/dL (ref 30.0–36.0)
MCV: 98.2 fL (ref 80.0–100.0)
Platelets: 324 10*3/uL (ref 150–400)
RBC: 3.86 MIL/uL — ABNORMAL LOW (ref 3.87–5.11)
RDW: 14.2 % (ref 11.5–15.5)
WBC: 5.6 10*3/uL (ref 4.0–10.5)
nRBC: 0 % (ref 0.0–0.2)

## 2020-04-15 MED ORDER — DOXYCYCLINE HYCLATE 100 MG PO CAPS: 100.0000 mg | ORAL_CAPSULE | Freq: Two times a day (BID) | ORAL | 0 refills | Status: AC

## 2020-04-15 MED ORDER — DEXAMETHASONE 4 MG PO TABS
10.0000 mg | ORAL_TABLET | Freq: Once | ORAL | Status: AC
  Administered 2020-04-15: 10 mg via ORAL
  Filled 2020-04-15: qty 3

## 2020-04-15 MED ORDER — MUPIROCIN 2 % EX OINT: 1.0000 "application " | TOPICAL_OINTMENT | Freq: Two times a day (BID) | CUTANEOUS | 0 refills | Status: AC

## 2020-04-15 NOTE — ED Triage Notes (Signed)
Patient arrives to ED from [facility] with [facility] for complaints of rash on her face. Per NH staff patient had a red rash on her forehead and cheeks that started around 4pm today. No SOB or itching noted by patient. Pt has dementia and cannot give good  Hx on what happened today. No new lotions or food used or ate by pt today per staff.

## 2020-04-15 NOTE — ED Provider Notes (Signed)
[facility] EMERGENCY DEPARTMENT Provider Note   CSN: [id number] Arrival date & time: 04/15/20  1709     History Chief Complaint  Patient presents with  . Rash    Nicole Hess is a 74 y.o. female.  74 yo F with a chief complaint of a rash.  This is only localized to her face.  Mostly on her forehead then also along the nasal ala and around the mouth.  She thinks has been there for a few days.  No fevers not painful not itchy.  No new soaps or detergents.  No new medications.  Denies trauma.  The history is provided by the patient.  Rash Location:  Face Facial rash location:  Face Quality: redness   Quality: not scaling, not swelling and not weeping   Severity:  Mild Onset quality:  Gradual Duration:  3 days Timing:  Constant Progression:  Unchanged Chronicity:  New Relieved by:  Nothing Worsened by:  Nothing Ineffective treatments:  None tried Associated symptoms: no fever, no headaches, no joint pain, no myalgias, no nausea, no shortness of breath, not vomiting and not wheezing        Past Medical History:  Diagnosis Date  . Abnormal Pap smear   . Anemia   . Anxiety   . Aortic stenosis   . Bicuspid aortic valve   . Coronary artery disease   . Depression   . Gout   . Heart murmur   . Heart valve problem    will have follow up Echo-Dr Jordan-will have replacement in a couple of years  . Leukopenia    Seen by Dr. Lamonte Sakai, present since 2008, watchful waiting  . Memory difficulty 04/29/2017  . Osteopenia   . Seizures ([place])    as a child  . Shortness of breath dyspnea   . Vertigo 04/29/2017    Patient Active Problem List   Diagnosis Date Noted  . Anemia, blood loss 11/30/2019  . Intertrochanteric fracture of right hip ([place])   . Hypocalcemia 08/23/2019  . COVID-19 virus infection 08/23/2019  . Hypokalemia 08/23/2019  . Overactive bladder 05/20/2018  . Seasonal allergic rhinitis due to pollen 05/20/2018  . Vertigo 04/29/2017  . Memory  difficulty 04/29/2017  . Dementia without behavioral disturbance ([place]) 02/06/2017  . 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) 11/09/2016  . History of colonic polyps 11/09/2016  . S/P AVR (aortic valve replacement) 04/02/2015  . Aortic stenosis due to bicuspid aortic valve 02/26/2015  . Severe aortic stenosis 05/02/2014  . Precordial pain 01/26/2013  . Depression 03/30/2012  . Hyperlipidemia 03/30/2012    Past Surgical History:  Procedure Laterality Date  . AORTIC VALVE REPLACEMENT N/A 02/26/2015   Procedure: AORTIC VALVE REPLACEMENT (AVR);  Surgeon: Gaye Pollack, MD;  Location: [facility];  Service: Open Heart Surgery;  Laterality: N/A;  . CARDIAC CATHETERIZATION N/A 01/29/2015   Procedure: Right/Left Heart Cath and Coronary Angiography;  Surgeon: Peter M Martinique, MD;  Location: [facility];  Service: Cardiovascular;  Laterality: N/A;  . CERVICAL CONE BIOPSY    . COLONOSCOPY  2010  . DOPPLER ECHOCARDIOGRAPHY  3/14  . GUM SURGERY    . INTRAMEDULLARY (IM) NAIL INTERTROCHANTERIC Right 11/25/2019   Procedure: INTRAMEDULLARY (IM) NAIL INTERTROCHANTRIC;  Surgeon: Leandrew Koyanagi, MD;  Location: [facility];  Service: Orthopedics;  Laterality: Right;  . TEE WITHOUT CARDIOVERSION N/A 02/26/2015   Procedure: TRANSESOPHAGEAL ECHOCARDIOGRAM (TEE);  Surgeon: Gaye Pollack, MD;  Location: [facility];  Service: Open Heart Surgery;  Laterality: N/A;  . TUBAL LIGATION    . VAGINAL HYSTERECTOMY     for dysplasia     OB History    Gravida  0   Para  0   Term  0   Preterm  0   AB  0   Living  0     SAB  0   TAB  0   Ectopic  0   Multiple  0   Live Births              Family History  Problem Relation Age of Onset  . Multiple myeloma Mother   . Stroke Father   . Hypertension Father   . Heart disease Father 16  . Kidney disease Father   . Cancer Father   . Cancer Maternal Grandmother        renal  . Osteoarthritis Brother     Social History   Tobacco Use  . Smoking status:  Never Smoker  . Smokeless tobacco: Never Used  Vaping Use  . Vaping Use: Never used  Substance Use Topics  . Alcohol use: Not Currently  . Drug use: No    Home Medications Prior to Admission medications   Medication Sig Start Date End Date Taking? Authorizing Provider  aspirin 81 MG chewable tablet Chew 81 mg by mouth daily.    [provider]  doxycycline (VIBRAMYCIN) 100 MG capsule Take 1 capsule (100 mg total) by mouth 2 (two) times daily. 04/15/20   Deno Etienne, DO  enoxaparin (LOVENOX) 40 MG/0.4ML injection Inject 0.4 mLs (40 mg total) into the skin daily. 11/26/19   Leandrew Koyanagi, MD  estradiol (ESTRACE) 0.1 MG/GM vaginal cream See admin instructions. Apply topically to labia on Mondays at bedtime. 10/20/19   [provider]  ferrous sulfate 324 (65 Fe) MG TBEC Take 1 tablet (325 mg total) by mouth daily. 12/08/19   Medina-Vargas, Monina C, NP  fluticasone (FLONASE ALLERGY RELIEF) 50 MCG/ACT nasal spray Place 1 spray into both nostrils daily.     [provider]  furosemide (LASIX) 20 MG tablet Take 10 mg by mouth daily. 11/05/19   [provider]  loratadine (CLARITIN) 10 MG tablet Take 10 mg by mouth daily as needed for allergies.     [provider]  memantine (NAMENDA) 10 MG tablet Take 1 tablet (10 mg total) by mouth 2 (two) times daily. 06/02/18   Kathrynn Ducking, MD  mupirocin ointment (BACTROBAN) 2 % Apply 1 application topically 2 (two) times daily. 04/15/20   Deno Etienne, DO  Nutritional Supplement LIQD Take 120 mLs by mouth in the morning and at bedtime. [employer]    [provider]  oxyCODONE-acetaminophen (PERCOCET/ROXICET) 5-325 MG tablet Take 1 tablet by mouth 2 (two) times daily as needed for severe pain. 12/08/19   Medina-Vargas, Monina C, NP  primidone (MYSOLINE) 250 MG tablet TAKE 1 TABLET BY MOUTH TWICE DAILY. 05/17/18   Kathrynn Ducking, MD  psyllium (REGULOID) 0.52 g capsule Take 0.52 g by mouth at bedtime.    [provider]  senna-docusate (SENNA S) 8.6-50 MG tablet Take 2 tablets by mouth 2 (two) times daily. 12/08/19   Medina-Vargas, Monina C, NP    Allergies    Dextromethorphan, Pseudoephedrine, and Penicillins  Review of Systems   Review of Systems  Constitutional: Negative for chills and fever.  HENT: Negative for congestion and rhinorrhea.   Eyes: Negative for redness and visual disturbance.  Respiratory: Negative for  shortness of breath and wheezing.   Cardiovascular: Negative for chest pain and palpitations.  Gastrointestinal: Negative for nausea and vomiting.  Genitourinary: Negative for dysuria and urgency.  Musculoskeletal: Negative for arthralgias and myalgias.  Skin: Positive for rash. Negative for pallor and wound.  Neurological: Negative for dizziness and headaches.    Physical Exam Updated Vital Signs BP (!) 136/54 (BP Location: Left Arm)   Pulse 74   Temp 97.8 F (36.6 C) (Oral)   Resp 16   LMP 09/15/1987   SpO2 100%   Physical Exam Vitals and nursing note reviewed.  Constitutional:      General: She is not in acute distress.    Appearance: She is well-developed. She is not diaphoretic.  HENT:     Head: Normocephalic and atraumatic.   Eyes:     Pupils: Pupils are equal, round, and reactive to light.  Cardiovascular:     Rate and Rhythm: Normal rate and regular rhythm.     Heart sounds: No murmur heard.  No friction rub. No gallop.   Pulmonary:     Effort: Pulmonary effort is normal.     Breath sounds: No wheezing or rales.  Abdominal:     General: There is no distension.     Palpations: Abdomen is soft.     Tenderness: There is no abdominal tenderness.  Musculoskeletal:        General: No tenderness.     Cervical back: Normal range of motion and neck supple.  Skin:    General: Skin is warm and dry.  Neurological:     Mental Status: She is alert and oriented to person, place, and time.  Psychiatric:        Behavior: Behavior normal.     ED  Results / Procedures / Treatments   Labs (all labs ordered are listed, but only abnormal results are displayed) Labs Reviewed  CBC - Abnormal; Notable for the following components:      Result Value   RBC 3.86 (*)    Hemoglobin 11.7 (*)    All other components within normal limits  BASIC METABOLIC PANEL - Abnormal; Notable for the following components:   Calcium 8.5 (*)    All other components within normal limits    EKG None  Radiology No results found.  Procedures Procedures (including critical care time)  Medications Ordered in ED Medications  dexamethasone (DECADRON) tablet 10 mg (has no administration in time range)    ED Course  I have reviewed the triage vital signs and the nursing notes.  Pertinent labs & imaging results that were available during my care of the patient were reviewed by me and considered in my medical decision making (see chart for details).    MDM Rules/Calculators/A&P                          74 yo F with a chief complaint of a facial rash.  Spares the rest of the body.  There is some honey colored crusting to it making me wonder if it is a staph infection.  Will start on mupirocin ointment.  Antibiotics.  More likely I think that this is a contact dermatitis or rosacea.  We will have her follow-up with her family doctor.  11:53 PM:  I have discussed the diagnosis/risks/treatment options with the patient and believe the pt to be eligible for discharge home to follow-up with PCP. We also discussed returning to the ED immediately if  new or worsening sx occur. We discussed the sx which are most concerning (e.g., sudden worsening pain, fever, inability to tolerate by mouth) that necessitate immediate return. Medications administered to the patient during their visit and any new prescriptions provided to the patient are listed below.  Medications given during this visit Medications  dexamethasone (DECADRON) tablet 10 mg (has no administration in time  range)     The patient appears reasonably screen and/or stabilized for discharge and I doubt any other medical condition or other [facility] requiring further screening, evaluation, or treatment in the ED at this time prior to discharge.   Final Clinical Impression(s) / ED Diagnoses Final diagnoses:  Facial rash    Rx / DC Orders ED Discharge Orders         Ordered    mupirocin ointment (BACTROBAN) 2 %  2 times daily     Discontinue  Reprint     04/15/20 2343    doxycycline (VIBRAMYCIN) 100 MG capsule  2 times daily     Discontinue  Reprint     04/15/20 2343           Deno Etienne, DO 04/15/20 2353

## 2020-04-15 NOTE — Discharge Instructions (Signed)
I think your rash most likely is contact dermatitis.  This is usually caused by a contact with something that you are allergic to.  Please discuss this with your facility to see what is being applied to your face and nowhere else.  There is an off chance that this is a skin infection.  I prescribed you an antibiotic ointment as well as antibiotics.  Please return for fever rapid spreading redness or eye involvement.

## 2020-04-16 DIAGNOSIS — M2550 Pain in unspecified joint: Secondary | ICD-10-CM | POA: Diagnosis not present

## 2020-04-16 DIAGNOSIS — R404 Transient alteration of awareness: Secondary | ICD-10-CM | POA: Diagnosis not present

## 2020-04-16 DIAGNOSIS — F29 Unspecified psychosis not due to a substance or known physiological condition: Secondary | ICD-10-CM | POA: Diagnosis not present

## 2020-04-16 DIAGNOSIS — Z7401 Bed confinement status: Secondary | ICD-10-CM | POA: Diagnosis not present

## 2020-04-16 DIAGNOSIS — R4182 Altered mental status, unspecified: Secondary | ICD-10-CM | POA: Diagnosis not present

## 2020-05-02 DIAGNOSIS — F0281 Dementia in other diseases classified elsewhere with behavioral disturbance: Secondary | ICD-10-CM | POA: Diagnosis not present

## 2020-05-02 DIAGNOSIS — G3183 Dementia with Lewy bodies: Secondary | ICD-10-CM | POA: Diagnosis not present

## 2020-05-02 DIAGNOSIS — R319 Hematuria, unspecified: Secondary | ICD-10-CM | POA: Diagnosis not present

## 2020-05-02 DIAGNOSIS — E46 Unspecified protein-calorie malnutrition: Secondary | ICD-10-CM | POA: Diagnosis not present

## 2020-05-22 DIAGNOSIS — R634 Abnormal weight loss: Secondary | ICD-10-CM | POA: Diagnosis not present

## 2020-08-11 DIAGNOSIS — F0280 Dementia in other diseases classified elsewhere without behavioral disturbance: Secondary | ICD-10-CM | POA: Diagnosis not present

## 2020-08-11 DIAGNOSIS — G40909 Epilepsy, unspecified, not intractable, without status epilepticus: Secondary | ICD-10-CM | POA: Diagnosis not present

## 2020-08-11 DIAGNOSIS — K5900 Constipation, unspecified: Secondary | ICD-10-CM | POA: Diagnosis not present

## 2020-09-04 DIAGNOSIS — G3183 Dementia with Lewy bodies: Secondary | ICD-10-CM | POA: Diagnosis not present

## 2020-09-04 DIAGNOSIS — F0281 Dementia in other diseases classified elsewhere with behavioral disturbance: Secondary | ICD-10-CM | POA: Diagnosis not present

## 2020-10-18 DIAGNOSIS — I2510 Atherosclerotic heart disease of native coronary artery without angina pectoris: Secondary | ICD-10-CM | POA: Diagnosis not present

## 2020-10-18 DIAGNOSIS — D500 Iron deficiency anemia secondary to blood loss (chronic): Secondary | ICD-10-CM | POA: Diagnosis not present

## 2020-10-18 DIAGNOSIS — K5900 Constipation, unspecified: Secondary | ICD-10-CM | POA: Diagnosis not present

## 2020-10-18 DIAGNOSIS — F419 Anxiety disorder, unspecified: Secondary | ICD-10-CM | POA: Diagnosis not present

## 2020-10-18 DIAGNOSIS — Z7982 Long term (current) use of aspirin: Secondary | ICD-10-CM | POA: Diagnosis not present

## 2020-10-18 DIAGNOSIS — M109 Gout, unspecified: Secondary | ICD-10-CM | POA: Diagnosis not present

## 2020-10-18 DIAGNOSIS — Z792 Long term (current) use of antibiotics: Secondary | ICD-10-CM | POA: Diagnosis not present

## 2020-10-18 DIAGNOSIS — S81801D Unspecified open wound, right lower leg, subsequent encounter: Secondary | ICD-10-CM | POA: Diagnosis not present

## 2020-10-18 DIAGNOSIS — F0280 Dementia in other diseases classified elsewhere without behavioral disturbance: Secondary | ICD-10-CM | POA: Diagnosis not present

## 2020-10-18 DIAGNOSIS — G40909 Epilepsy, unspecified, not intractable, without status epilepticus: Secondary | ICD-10-CM | POA: Diagnosis not present

## 2020-10-23 DIAGNOSIS — G20 Parkinson's disease: Secondary | ICD-10-CM | POA: Diagnosis not present

## 2020-10-23 DIAGNOSIS — S51812A Laceration without foreign body of left forearm, initial encounter: Secondary | ICD-10-CM | POA: Diagnosis not present

## 2020-10-23 DIAGNOSIS — I739 Peripheral vascular disease, unspecified: Secondary | ICD-10-CM | POA: Diagnosis not present

## 2020-10-23 DIAGNOSIS — M542 Cervicalgia: Secondary | ICD-10-CM | POA: Diagnosis not present

## 2020-10-23 DIAGNOSIS — Z8669 Personal history of other diseases of the nervous system and sense organs: Secondary | ICD-10-CM | POA: Diagnosis not present

## 2020-10-23 DIAGNOSIS — S81801A Unspecified open wound, right lower leg, initial encounter: Secondary | ICD-10-CM | POA: Diagnosis not present

## 2020-10-23 DIAGNOSIS — E785 Hyperlipidemia, unspecified: Secondary | ICD-10-CM | POA: Diagnosis not present

## 2020-10-23 DIAGNOSIS — U071 COVID-19: Secondary | ICD-10-CM | POA: Diagnosis not present

## 2020-11-01 DIAGNOSIS — G3183 Dementia with Lewy bodies: Secondary | ICD-10-CM | POA: Diagnosis not present

## 2020-11-13 DIAGNOSIS — L603 Nail dystrophy: Secondary | ICD-10-CM | POA: Diagnosis not present

## 2020-11-13 DIAGNOSIS — I739 Peripheral vascular disease, unspecified: Secondary | ICD-10-CM | POA: Diagnosis not present

## 2020-11-13 DIAGNOSIS — Q845 Enlarged and hypertrophic nails: Secondary | ICD-10-CM | POA: Diagnosis not present

## 2020-11-20 DIAGNOSIS — F0390 Unspecified dementia without behavioral disturbance: Secondary | ICD-10-CM | POA: Diagnosis not present

## 2020-11-20 DIAGNOSIS — R2681 Unsteadiness on feet: Secondary | ICD-10-CM | POA: Diagnosis not present

## 2020-11-20 DIAGNOSIS — S81801A Unspecified open wound, right lower leg, initial encounter: Secondary | ICD-10-CM | POA: Diagnosis not present

## 2020-11-20 DIAGNOSIS — E785 Hyperlipidemia, unspecified: Secondary | ICD-10-CM | POA: Diagnosis not present

## 2020-11-20 DIAGNOSIS — M6281 Muscle weakness (generalized): Secondary | ICD-10-CM | POA: Diagnosis not present

## 2020-11-20 DIAGNOSIS — Z8781 Personal history of (healed) traumatic fracture: Secondary | ICD-10-CM | POA: Diagnosis not present

## 2020-11-20 DIAGNOSIS — Z79899 Other long term (current) drug therapy: Secondary | ICD-10-CM | POA: Diagnosis not present

## 2020-11-20 DIAGNOSIS — E46 Unspecified protein-calorie malnutrition: Secondary | ICD-10-CM | POA: Diagnosis not present

## 2020-11-20 DIAGNOSIS — S51812A Laceration without foreign body of left forearm, initial encounter: Secondary | ICD-10-CM | POA: Diagnosis not present

## 2020-11-21 DIAGNOSIS — G40909 Epilepsy, unspecified, not intractable, without status epilepticus: Secondary | ICD-10-CM | POA: Diagnosis not present

## 2020-11-21 DIAGNOSIS — S81801D Unspecified open wound, right lower leg, subsequent encounter: Secondary | ICD-10-CM | POA: Diagnosis not present

## 2020-11-21 DIAGNOSIS — F0280 Dementia in other diseases classified elsewhere without behavioral disturbance: Secondary | ICD-10-CM | POA: Diagnosis not present

## 2020-11-27 DIAGNOSIS — Z7982 Long term (current) use of aspirin: Secondary | ICD-10-CM | POA: Diagnosis not present

## 2020-11-27 DIAGNOSIS — I2510 Atherosclerotic heart disease of native coronary artery without angina pectoris: Secondary | ICD-10-CM | POA: Diagnosis not present

## 2020-11-27 DIAGNOSIS — K5900 Constipation, unspecified: Secondary | ICD-10-CM | POA: Diagnosis not present

## 2020-11-27 DIAGNOSIS — D500 Iron deficiency anemia secondary to blood loss (chronic): Secondary | ICD-10-CM | POA: Diagnosis not present

## 2020-11-27 DIAGNOSIS — G40909 Epilepsy, unspecified, not intractable, without status epilepticus: Secondary | ICD-10-CM | POA: Diagnosis not present

## 2020-11-27 DIAGNOSIS — Z792 Long term (current) use of antibiotics: Secondary | ICD-10-CM | POA: Diagnosis not present

## 2020-11-27 DIAGNOSIS — F419 Anxiety disorder, unspecified: Secondary | ICD-10-CM | POA: Diagnosis not present

## 2020-11-27 DIAGNOSIS — F0280 Dementia in other diseases classified elsewhere without behavioral disturbance: Secondary | ICD-10-CM | POA: Diagnosis not present

## 2020-11-27 DIAGNOSIS — S81801D Unspecified open wound, right lower leg, subsequent encounter: Secondary | ICD-10-CM | POA: Diagnosis not present

## 2020-11-27 DIAGNOSIS — M109 Gout, unspecified: Secondary | ICD-10-CM | POA: Diagnosis not present

## 2020-11-29 DIAGNOSIS — G3183 Dementia with Lewy bodies: Secondary | ICD-10-CM | POA: Diagnosis not present

## 2020-12-16 DIAGNOSIS — K5900 Constipation, unspecified: Secondary | ICD-10-CM | POA: Diagnosis not present

## 2020-12-16 DIAGNOSIS — G40909 Epilepsy, unspecified, not intractable, without status epilepticus: Secondary | ICD-10-CM | POA: Diagnosis not present

## 2020-12-16 DIAGNOSIS — Z7982 Long term (current) use of aspirin: Secondary | ICD-10-CM | POA: Diagnosis not present

## 2020-12-16 DIAGNOSIS — F0280 Dementia in other diseases classified elsewhere without behavioral disturbance: Secondary | ICD-10-CM | POA: Diagnosis not present

## 2020-12-16 DIAGNOSIS — S81801D Unspecified open wound, right lower leg, subsequent encounter: Secondary | ICD-10-CM | POA: Diagnosis not present

## 2020-12-16 DIAGNOSIS — F419 Anxiety disorder, unspecified: Secondary | ICD-10-CM | POA: Diagnosis not present

## 2020-12-16 DIAGNOSIS — M109 Gout, unspecified: Secondary | ICD-10-CM | POA: Diagnosis not present

## 2020-12-16 DIAGNOSIS — D500 Iron deficiency anemia secondary to blood loss (chronic): Secondary | ICD-10-CM | POA: Diagnosis not present

## 2020-12-16 DIAGNOSIS — Z792 Long term (current) use of antibiotics: Secondary | ICD-10-CM | POA: Diagnosis not present

## 2020-12-16 DIAGNOSIS — I2510 Atherosclerotic heart disease of native coronary artery without angina pectoris: Secondary | ICD-10-CM | POA: Diagnosis not present

## 2020-12-18 DIAGNOSIS — G40909 Epilepsy, unspecified, not intractable, without status epilepticus: Secondary | ICD-10-CM | POA: Diagnosis not present

## 2020-12-18 DIAGNOSIS — F0280 Dementia in other diseases classified elsewhere without behavioral disturbance: Secondary | ICD-10-CM | POA: Diagnosis not present

## 2020-12-18 DIAGNOSIS — S81801D Unspecified open wound, right lower leg, subsequent encounter: Secondary | ICD-10-CM | POA: Diagnosis not present

## 2020-12-19 DIAGNOSIS — S81801A Unspecified open wound, right lower leg, initial encounter: Secondary | ICD-10-CM | POA: Diagnosis not present

## 2020-12-19 DIAGNOSIS — S81801D Unspecified open wound, right lower leg, subsequent encounter: Secondary | ICD-10-CM | POA: Diagnosis not present

## 2020-12-19 DIAGNOSIS — K5900 Constipation, unspecified: Secondary | ICD-10-CM | POA: Diagnosis not present

## 2020-12-19 DIAGNOSIS — S81802D Unspecified open wound, left lower leg, subsequent encounter: Secondary | ICD-10-CM | POA: Diagnosis not present

## 2020-12-19 DIAGNOSIS — G3183 Dementia with Lewy bodies: Secondary | ICD-10-CM | POA: Diagnosis not present

## 2020-12-19 DIAGNOSIS — F0281 Dementia in other diseases classified elsewhere with behavioral disturbance: Secondary | ICD-10-CM | POA: Diagnosis not present

## 2020-12-19 DIAGNOSIS — R278 Other lack of coordination: Secondary | ICD-10-CM | POA: Diagnosis not present

## 2020-12-19 DIAGNOSIS — R2681 Unsteadiness on feet: Secondary | ICD-10-CM | POA: Diagnosis not present

## 2020-12-19 DIAGNOSIS — R600 Localized edema: Secondary | ICD-10-CM | POA: Diagnosis not present

## 2020-12-19 DIAGNOSIS — F419 Anxiety disorder, unspecified: Secondary | ICD-10-CM | POA: Diagnosis not present

## 2020-12-19 DIAGNOSIS — L03116 Cellulitis of left lower limb: Secondary | ICD-10-CM | POA: Diagnosis not present

## 2020-12-19 DIAGNOSIS — L03115 Cellulitis of right lower limb: Secondary | ICD-10-CM | POA: Diagnosis not present

## 2020-12-19 DIAGNOSIS — M109 Gout, unspecified: Secondary | ICD-10-CM | POA: Diagnosis not present

## 2020-12-19 DIAGNOSIS — I2510 Atherosclerotic heart disease of native coronary artery without angina pectoris: Secondary | ICD-10-CM | POA: Diagnosis not present

## 2020-12-19 DIAGNOSIS — D500 Iron deficiency anemia secondary to blood loss (chronic): Secondary | ICD-10-CM | POA: Diagnosis not present

## 2020-12-19 DIAGNOSIS — Z792 Long term (current) use of antibiotics: Secondary | ICD-10-CM | POA: Diagnosis not present

## 2020-12-19 DIAGNOSIS — Z7982 Long term (current) use of aspirin: Secondary | ICD-10-CM | POA: Diagnosis not present

## 2020-12-19 DIAGNOSIS — E559 Vitamin D deficiency, unspecified: Secondary | ICD-10-CM | POA: Diagnosis not present

## 2020-12-19 DIAGNOSIS — G40909 Epilepsy, unspecified, not intractable, without status epilepticus: Secondary | ICD-10-CM | POA: Diagnosis not present

## 2020-12-19 DIAGNOSIS — F0280 Dementia in other diseases classified elsewhere without behavioral disturbance: Secondary | ICD-10-CM | POA: Diagnosis not present

## 2020-12-19 DIAGNOSIS — E785 Hyperlipidemia, unspecified: Secondary | ICD-10-CM | POA: Diagnosis not present

## 2020-12-20 DIAGNOSIS — F0280 Dementia in other diseases classified elsewhere without behavioral disturbance: Secondary | ICD-10-CM | POA: Diagnosis not present

## 2020-12-20 DIAGNOSIS — I2510 Atherosclerotic heart disease of native coronary artery without angina pectoris: Secondary | ICD-10-CM | POA: Diagnosis not present

## 2020-12-20 DIAGNOSIS — F419 Anxiety disorder, unspecified: Secondary | ICD-10-CM | POA: Diagnosis not present

## 2020-12-20 DIAGNOSIS — Z792 Long term (current) use of antibiotics: Secondary | ICD-10-CM | POA: Diagnosis not present

## 2020-12-20 DIAGNOSIS — R278 Other lack of coordination: Secondary | ICD-10-CM | POA: Diagnosis not present

## 2020-12-20 DIAGNOSIS — K5900 Constipation, unspecified: Secondary | ICD-10-CM | POA: Diagnosis not present

## 2020-12-20 DIAGNOSIS — G40909 Epilepsy, unspecified, not intractable, without status epilepticus: Secondary | ICD-10-CM | POA: Diagnosis not present

## 2020-12-20 DIAGNOSIS — S81801D Unspecified open wound, right lower leg, subsequent encounter: Secondary | ICD-10-CM | POA: Diagnosis not present

## 2020-12-20 DIAGNOSIS — M109 Gout, unspecified: Secondary | ICD-10-CM | POA: Diagnosis not present

## 2020-12-20 DIAGNOSIS — Z7982 Long term (current) use of aspirin: Secondary | ICD-10-CM | POA: Diagnosis not present

## 2020-12-20 DIAGNOSIS — D500 Iron deficiency anemia secondary to blood loss (chronic): Secondary | ICD-10-CM | POA: Diagnosis not present

## 2020-12-23 DIAGNOSIS — G3183 Dementia with Lewy bodies: Secondary | ICD-10-CM | POA: Diagnosis not present

## 2020-12-26 DIAGNOSIS — K5909 Other constipation: Secondary | ICD-10-CM | POA: Diagnosis not present

## 2020-12-30 DIAGNOSIS — R2681 Unsteadiness on feet: Secondary | ICD-10-CM | POA: Diagnosis not present

## 2020-12-31 DIAGNOSIS — K5900 Constipation, unspecified: Secondary | ICD-10-CM | POA: Diagnosis not present

## 2020-12-31 DIAGNOSIS — S81801D Unspecified open wound, right lower leg, subsequent encounter: Secondary | ICD-10-CM | POA: Diagnosis not present

## 2020-12-31 DIAGNOSIS — R278 Other lack of coordination: Secondary | ICD-10-CM | POA: Diagnosis not present

## 2020-12-31 DIAGNOSIS — D500 Iron deficiency anemia secondary to blood loss (chronic): Secondary | ICD-10-CM | POA: Diagnosis not present

## 2020-12-31 DIAGNOSIS — Z7982 Long term (current) use of aspirin: Secondary | ICD-10-CM | POA: Diagnosis not present

## 2020-12-31 DIAGNOSIS — G40909 Epilepsy, unspecified, not intractable, without status epilepticus: Secondary | ICD-10-CM | POA: Diagnosis not present

## 2020-12-31 DIAGNOSIS — M109 Gout, unspecified: Secondary | ICD-10-CM | POA: Diagnosis not present

## 2020-12-31 DIAGNOSIS — Z792 Long term (current) use of antibiotics: Secondary | ICD-10-CM | POA: Diagnosis not present

## 2020-12-31 DIAGNOSIS — F419 Anxiety disorder, unspecified: Secondary | ICD-10-CM | POA: Diagnosis not present

## 2020-12-31 DIAGNOSIS — F0280 Dementia in other diseases classified elsewhere without behavioral disturbance: Secondary | ICD-10-CM | POA: Diagnosis not present

## 2020-12-31 DIAGNOSIS — I2510 Atherosclerotic heart disease of native coronary artery without angina pectoris: Secondary | ICD-10-CM | POA: Diagnosis not present

## 2021-01-02 DIAGNOSIS — K5909 Other constipation: Secondary | ICD-10-CM | POA: Diagnosis not present

## 2021-01-02 DIAGNOSIS — R451 Restlessness and agitation: Secondary | ICD-10-CM | POA: Diagnosis not present

## 2021-01-02 DIAGNOSIS — R8290 Unspecified abnormal findings in urine: Secondary | ICD-10-CM | POA: Diagnosis not present

## 2021-01-06 DIAGNOSIS — R2681 Unsteadiness on feet: Secondary | ICD-10-CM | POA: Diagnosis not present

## 2021-01-14 DIAGNOSIS — Z792 Long term (current) use of antibiotics: Secondary | ICD-10-CM | POA: Diagnosis not present

## 2021-01-14 DIAGNOSIS — F419 Anxiety disorder, unspecified: Secondary | ICD-10-CM | POA: Diagnosis not present

## 2021-01-14 DIAGNOSIS — R278 Other lack of coordination: Secondary | ICD-10-CM | POA: Diagnosis not present

## 2021-01-14 DIAGNOSIS — Z7982 Long term (current) use of aspirin: Secondary | ICD-10-CM | POA: Diagnosis not present

## 2021-01-14 DIAGNOSIS — F0280 Dementia in other diseases classified elsewhere without behavioral disturbance: Secondary | ICD-10-CM | POA: Diagnosis not present

## 2021-01-14 DIAGNOSIS — I2510 Atherosclerotic heart disease of native coronary artery without angina pectoris: Secondary | ICD-10-CM | POA: Diagnosis not present

## 2021-01-14 DIAGNOSIS — D500 Iron deficiency anemia secondary to blood loss (chronic): Secondary | ICD-10-CM | POA: Diagnosis not present

## 2021-01-14 DIAGNOSIS — K5900 Constipation, unspecified: Secondary | ICD-10-CM | POA: Diagnosis not present

## 2021-01-14 DIAGNOSIS — M109 Gout, unspecified: Secondary | ICD-10-CM | POA: Diagnosis not present

## 2021-01-14 DIAGNOSIS — S81801D Unspecified open wound, right lower leg, subsequent encounter: Secondary | ICD-10-CM | POA: Diagnosis not present

## 2021-01-14 DIAGNOSIS — G40909 Epilepsy, unspecified, not intractable, without status epilepticus: Secondary | ICD-10-CM | POA: Diagnosis not present

## 2021-01-16 DIAGNOSIS — R109 Unspecified abdominal pain: Secondary | ICD-10-CM | POA: Diagnosis not present

## 2021-01-16 DIAGNOSIS — K5900 Constipation, unspecified: Secondary | ICD-10-CM | POA: Diagnosis not present

## 2021-01-16 DIAGNOSIS — Z993 Dependence on wheelchair: Secondary | ICD-10-CM | POA: Diagnosis not present

## 2021-01-16 DIAGNOSIS — K5909 Other constipation: Secondary | ICD-10-CM | POA: Diagnosis not present

## 2021-01-16 DIAGNOSIS — F0281 Dementia in other diseases classified elsewhere with behavioral disturbance: Secondary | ICD-10-CM | POA: Diagnosis not present

## 2021-01-16 DIAGNOSIS — R451 Restlessness and agitation: Secondary | ICD-10-CM | POA: Diagnosis not present

## 2021-01-16 DIAGNOSIS — R600 Localized edema: Secondary | ICD-10-CM | POA: Diagnosis not present

## 2021-01-16 DIAGNOSIS — G301 Alzheimer's disease with late onset: Secondary | ICD-10-CM | POA: Diagnosis not present

## 2021-01-16 DIAGNOSIS — R238 Other skin changes: Secondary | ICD-10-CM | POA: Diagnosis not present

## 2021-01-21 DIAGNOSIS — Q845 Enlarged and hypertrophic nails: Secondary | ICD-10-CM | POA: Diagnosis not present

## 2021-01-21 DIAGNOSIS — R600 Localized edema: Secondary | ICD-10-CM | POA: Diagnosis not present

## 2021-01-21 DIAGNOSIS — F0391 Unspecified dementia with behavioral disturbance: Secondary | ICD-10-CM | POA: Diagnosis not present

## 2021-01-21 DIAGNOSIS — I739 Peripheral vascular disease, unspecified: Secondary | ICD-10-CM | POA: Diagnosis not present

## 2021-01-21 DIAGNOSIS — G301 Alzheimer's disease with late onset: Secondary | ICD-10-CM | POA: Diagnosis not present

## 2021-01-21 DIAGNOSIS — R5381 Other malaise: Secondary | ICD-10-CM | POA: Diagnosis not present

## 2021-01-21 DIAGNOSIS — L603 Nail dystrophy: Secondary | ICD-10-CM | POA: Diagnosis not present

## 2021-01-21 DIAGNOSIS — K5909 Other constipation: Secondary | ICD-10-CM | POA: Diagnosis not present

## 2021-01-23 DIAGNOSIS — F0391 Unspecified dementia with behavioral disturbance: Secondary | ICD-10-CM | POA: Diagnosis not present

## 2021-01-23 DIAGNOSIS — K5909 Other constipation: Secondary | ICD-10-CM | POA: Diagnosis not present

## 2021-01-23 DIAGNOSIS — R627 Adult failure to thrive: Secondary | ICD-10-CM | POA: Diagnosis not present

## 2021-01-29 DIAGNOSIS — R2681 Unsteadiness on feet: Secondary | ICD-10-CM | POA: Diagnosis not present

## 2021-02-19 DIAGNOSIS — G3183 Dementia with Lewy bodies: Secondary | ICD-10-CM | POA: Diagnosis not present

## 2021-03-01 DIAGNOSIS — R2681 Unsteadiness on feet: Secondary | ICD-10-CM | POA: Diagnosis not present

## 2021-03-06 DIAGNOSIS — L98492 Non-pressure chronic ulcer of skin of other sites with fat layer exposed: Secondary | ICD-10-CM | POA: Diagnosis not present

## 2021-03-06 DIAGNOSIS — F0391 Unspecified dementia with behavioral disturbance: Secondary | ICD-10-CM | POA: Diagnosis not present

## 2021-03-06 DIAGNOSIS — K5909 Other constipation: Secondary | ICD-10-CM | POA: Diagnosis not present

## 2021-03-06 DIAGNOSIS — R609 Edema, unspecified: Secondary | ICD-10-CM | POA: Diagnosis not present

## 2021-03-06 DIAGNOSIS — M899 Disorder of bone, unspecified: Secondary | ICD-10-CM | POA: Diagnosis not present

## 2021-03-06 DIAGNOSIS — Z79899 Other long term (current) drug therapy: Secondary | ICD-10-CM | POA: Diagnosis not present

## 2021-03-06 DIAGNOSIS — I350 Nonrheumatic aortic (valve) stenosis: Secondary | ICD-10-CM | POA: Diagnosis not present

## 2021-03-24 DIAGNOSIS — G3183 Dementia with Lewy bodies: Secondary | ICD-10-CM | POA: Diagnosis not present

## 2021-03-31 DIAGNOSIS — R2681 Unsteadiness on feet: Secondary | ICD-10-CM | POA: Diagnosis not present

## 2021-04-03 DIAGNOSIS — G3183 Dementia with Lewy bodies: Secondary | ICD-10-CM | POA: Diagnosis not present

## 2021-04-03 DIAGNOSIS — R451 Restlessness and agitation: Secondary | ICD-10-CM | POA: Diagnosis not present

## 2021-04-11 DIAGNOSIS — F0391 Unspecified dementia with behavioral disturbance: Secondary | ICD-10-CM | POA: Diagnosis not present

## 2021-04-11 DIAGNOSIS — K5901 Slow transit constipation: Secondary | ICD-10-CM | POA: Diagnosis not present

## 2021-04-11 DIAGNOSIS — G301 Alzheimer's disease with late onset: Secondary | ICD-10-CM | POA: Diagnosis not present

## 2021-04-11 DIAGNOSIS — R2681 Unsteadiness on feet: Secondary | ICD-10-CM | POA: Diagnosis not present

## 2021-04-11 DIAGNOSIS — E559 Vitamin D deficiency, unspecified: Secondary | ICD-10-CM | POA: Diagnosis not present

## 2021-04-21 DIAGNOSIS — G3183 Dementia with Lewy bodies: Secondary | ICD-10-CM | POA: Diagnosis not present

## 2021-05-01 DIAGNOSIS — R2681 Unsteadiness on feet: Secondary | ICD-10-CM | POA: Diagnosis not present

## 2021-05-08 DIAGNOSIS — R6889 Other general symptoms and signs: Secondary | ICD-10-CM | POA: Diagnosis not present

## 2021-05-08 DIAGNOSIS — R451 Restlessness and agitation: Secondary | ICD-10-CM | POA: Diagnosis not present

## 2021-05-08 DIAGNOSIS — R52 Pain, unspecified: Secondary | ICD-10-CM | POA: Diagnosis not present

## 2021-05-08 DIAGNOSIS — Z79899 Other long term (current) drug therapy: Secondary | ICD-10-CM | POA: Diagnosis not present

## 2021-05-08 DIAGNOSIS — Z515 Encounter for palliative care: Secondary | ICD-10-CM | POA: Diagnosis not present

## 2021-05-15 DEATH — deceased

## 2021-08-09 IMAGING — CT CT CERVICAL SPINE W/O CM
3 of 4 series · 13 of 33 positions shown, 16 images · non-contrast
Comparison: May 03, 2018

CLINICAL DATA: Fall and dementia

EXAM:
CT CERVICAL SPINE WITHOUT CONTRAST
TECHNIQUE: Multidetector CT imaging of the cervical spine was performed without
intravenous contrast. Multiplanar CT image reconstructions were also
generated.

[Series 6: orthogonal bone · axial · 0.23mm/px · z∈[+1113,+1237]mm · 5 of 96 slices shown, 7 images]
[im 14/96  soft-tissue]
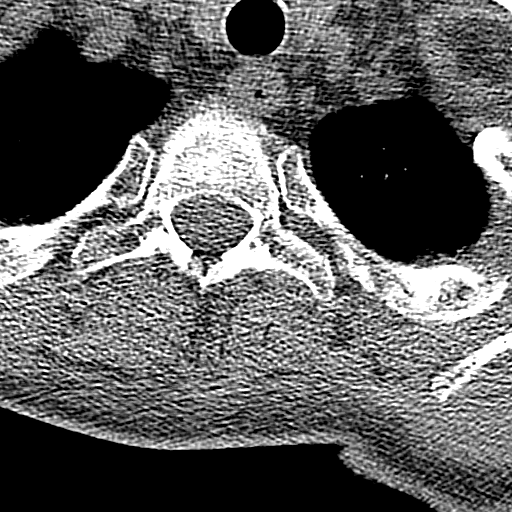
[im 14/96  bone]
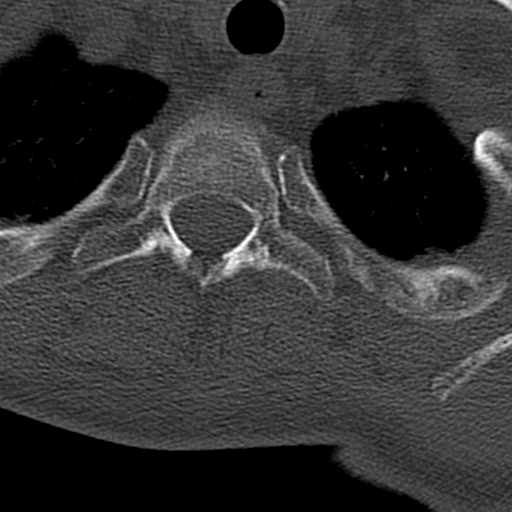
[im 28/96  bone]
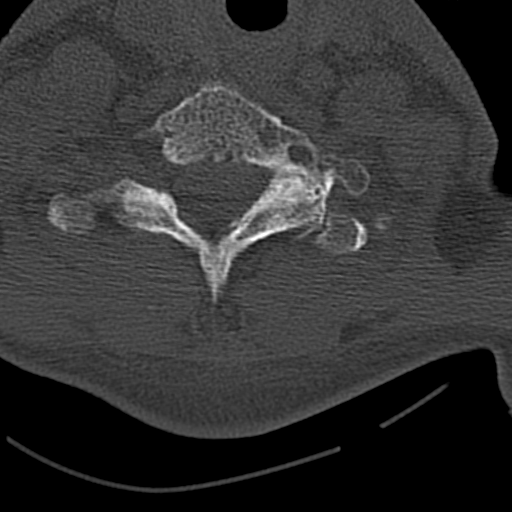
[im 55/96  bone]
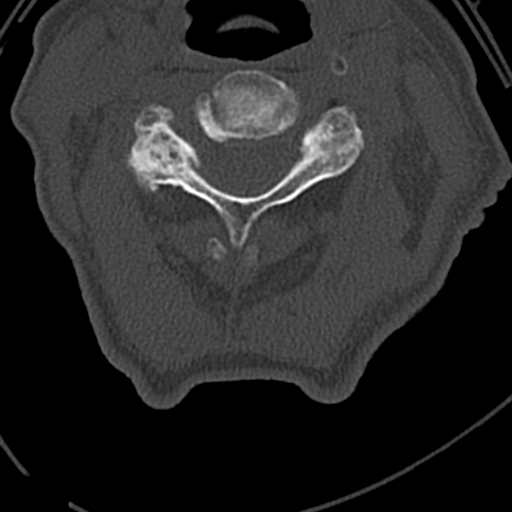
[im 68/96  bone]
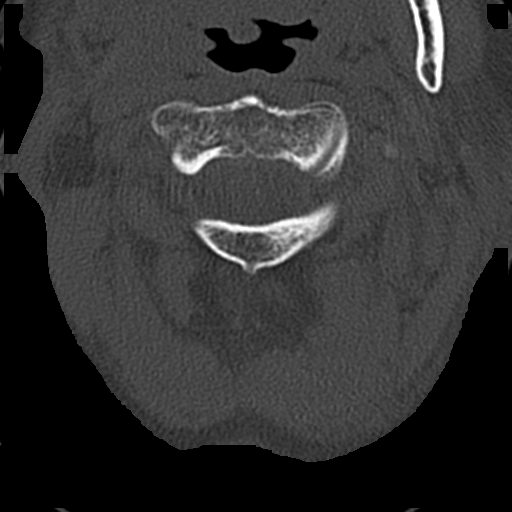
[im 82/96  soft-tissue]
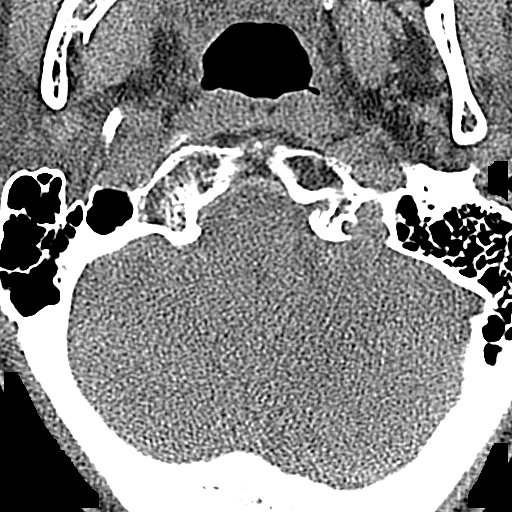
[im 82/96  bone]
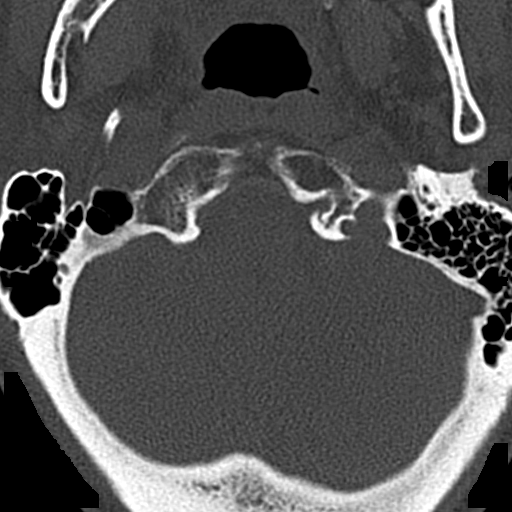

[Series 7: coronal bone · coronal · 0.28mm/px · 3 of 63 slices shown]
[im 13/63  bone]
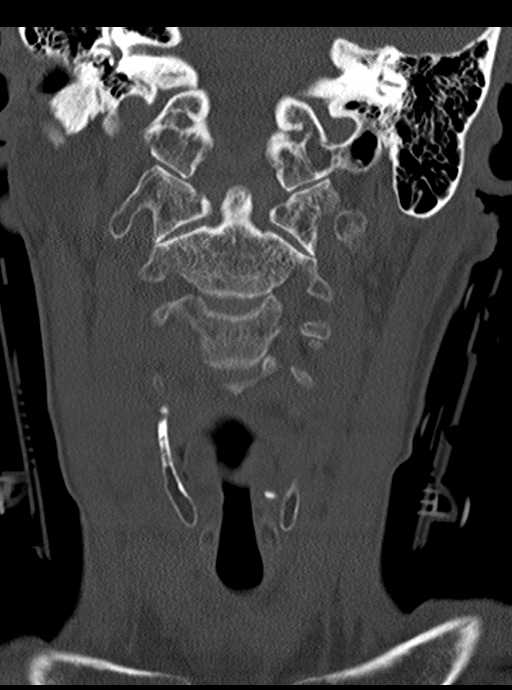
[im 25/63  bone]
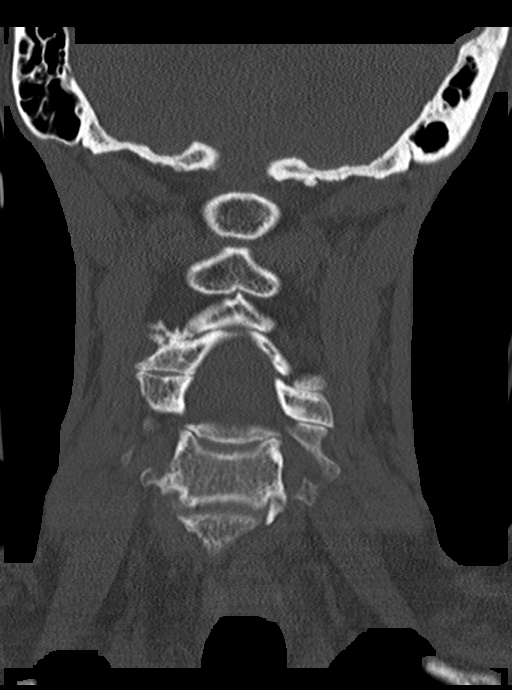
[im 38/63  bone]
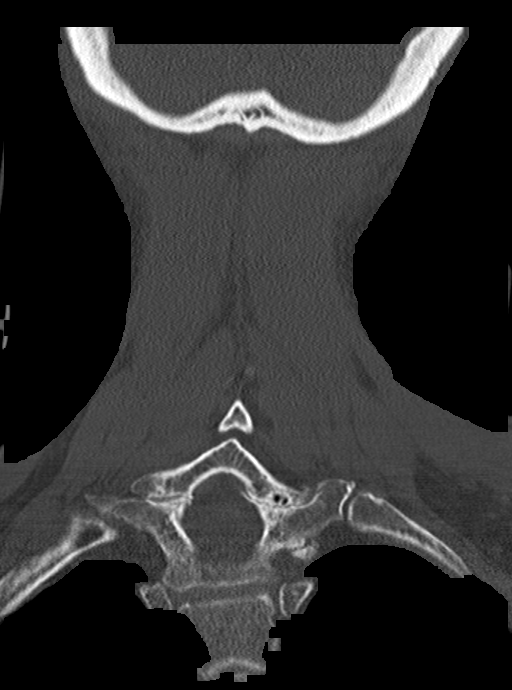

[Series 8: sagittal bone · sagittal · 0.28mm/px · 5 of 42 slices shown, 6 images]
[im 14/42  bone]
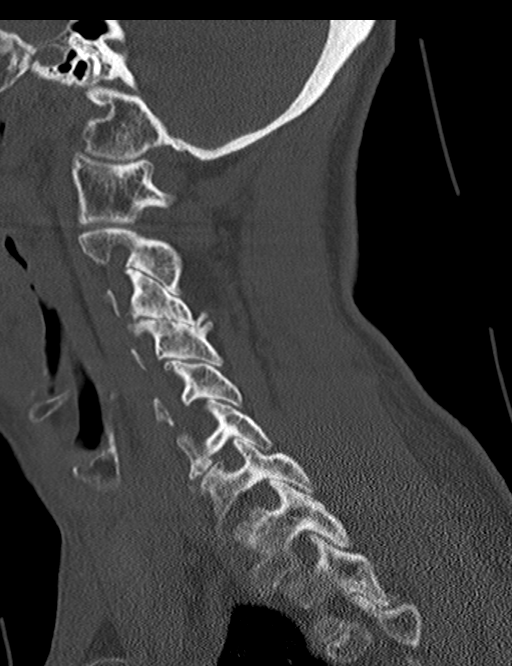
[im 18/42  bone]
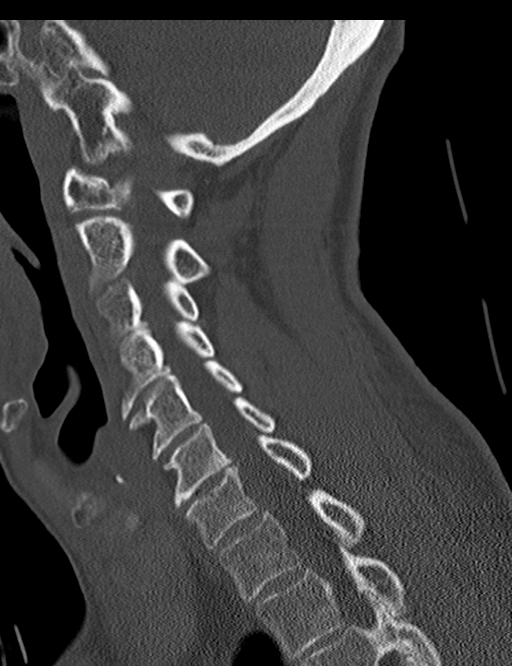
[im 21/42  soft-tissue]
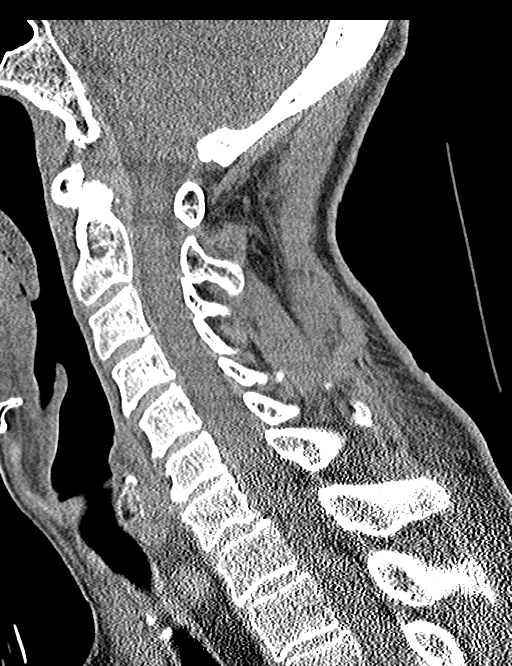
[im 21/42  bone]
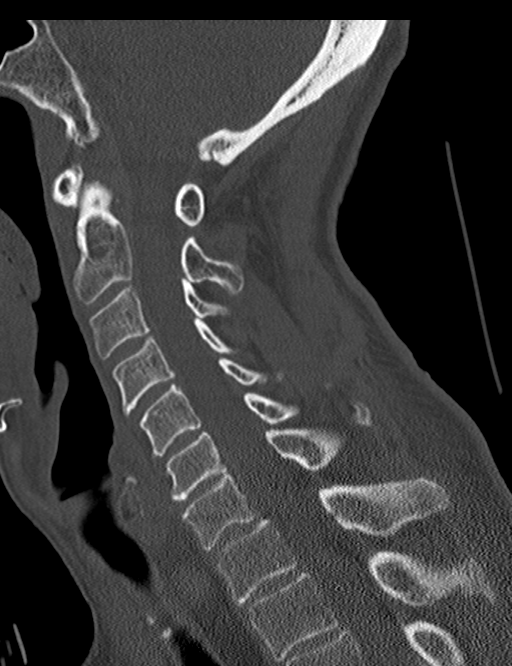
[im 24/42  bone]
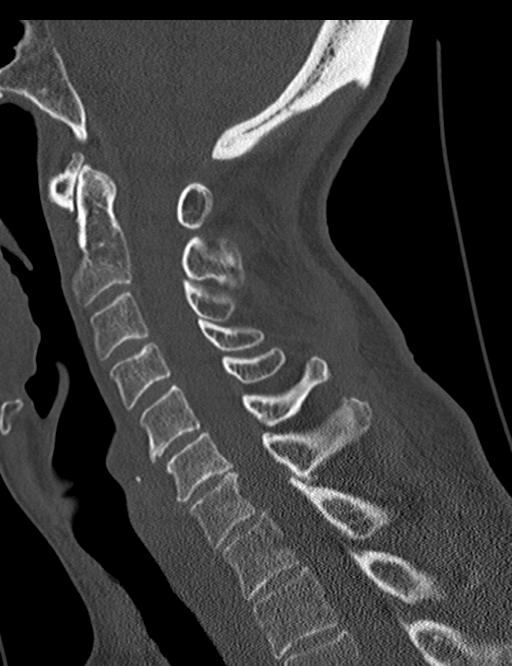
[im 28/42  bone]
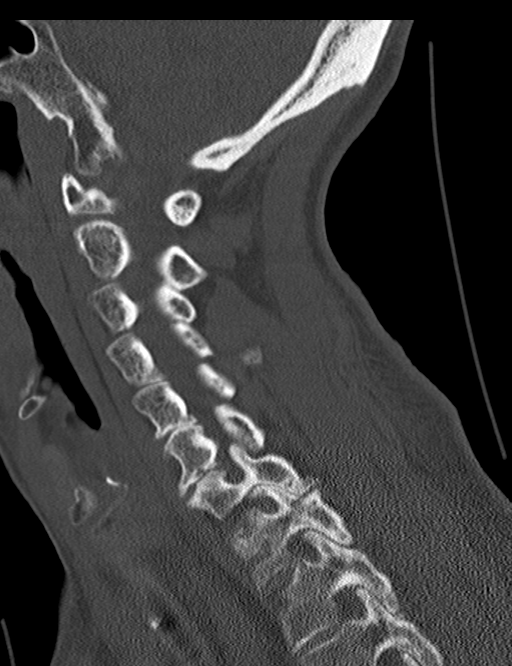

[13 of 33 positions shown; findings below may reference images not displayed]

FINDINGS: Brain: No evidence of acute territorial infarction, hemorrhage,
hydrocephalus,extra-axial collection or mass lesion/mass effect.
There is mild dilatation the ventricles and sulci consistent with
age-related atrophy. Low-attenuation changes in the deep white
matter consistent with small vessel ischemia.

Vascular: No hyperdense vessel or unexpected calcification.

Skull: The skull is intact. No fracture or focal lesion identified.

Sinuses/Orbits: Mild mucosal thickening seen within the left
maxillary sinus. The orbits and globes intact.

Other: None

Cervical spine:

Alignment: There is straightening of the normal cervical lordosis.

Skull base and vertebrae: Visualized skull base is intact. No
atlanto-occipital dissociation. The vertebral body heights are well
maintained. No fracture or pathologic osseous lesion seen.

Soft tissues and spinal canal: The visualized paraspinal soft
tissues are unremarkable. No prevertebral soft tissue swelling is
seen. The spinal canal is grossly unremarkable, no large epidural
collection or significant canal narrowing.

Disc levels: There is mild disc height loss with anterior
osteophytes and minimal disc osteophyte complex at C5-C6. There is
mild neural foraminal narrowing.6

Upper chest: The lung apices are clear. Thoracic inlet is within
normal limits.

Other: None
IMPRESSION: 1.  No acute intracranial abnormality.
2. Findings consistent with mild age related atrophy and chronic
small vessel ischemia
3. Mild left maxillary sinus mucosal thickening.
4. No acute fracture or malalignment of the spine.

## 2021-08-09 IMAGING — CT CT HEAD W/O CM
3 series · 15 of 47 positions shown, 18 images · non-contrast
Comparison: May 03, 2018

CLINICAL DATA: Fall and dementia

EXAM:
CT CERVICAL SPINE WITHOUT CONTRAST
TECHNIQUE: Multidetector CT imaging of the cervical spine was performed without
intravenous contrast. Multiplanar CT image reconstructions were also
generated.

[Series 3: head wo · axial · 0.48mm/px · z∈[+1285,+1410]mm · 9 of 31 slices shown, 12 images]
[im 3/31  brain]
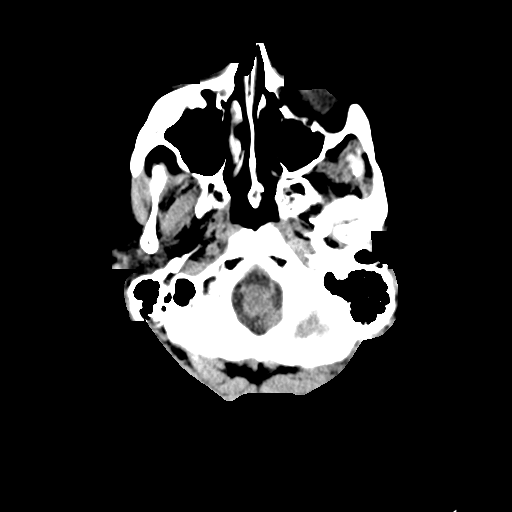
[im 3/31  bone]
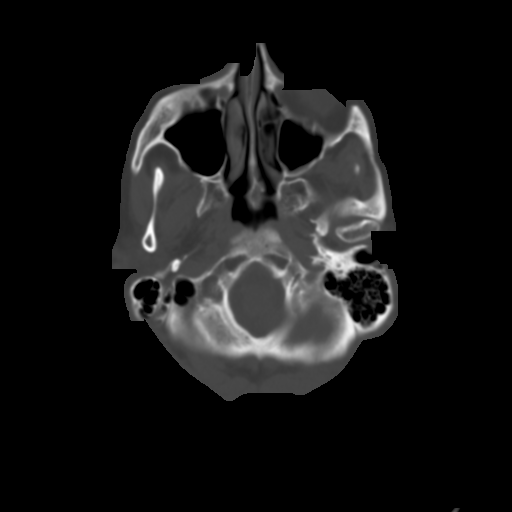
[im 6/31  brain]
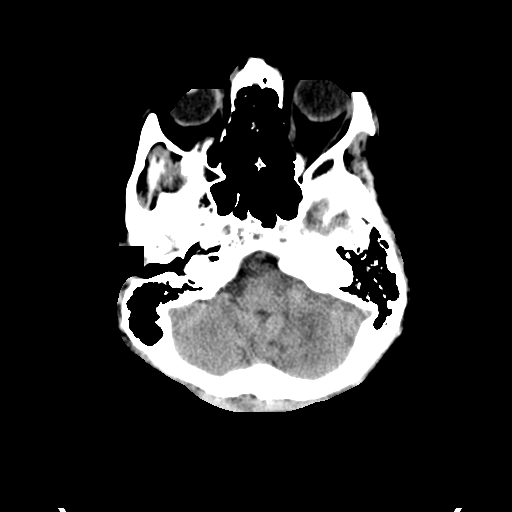
[im 9/31  brain]
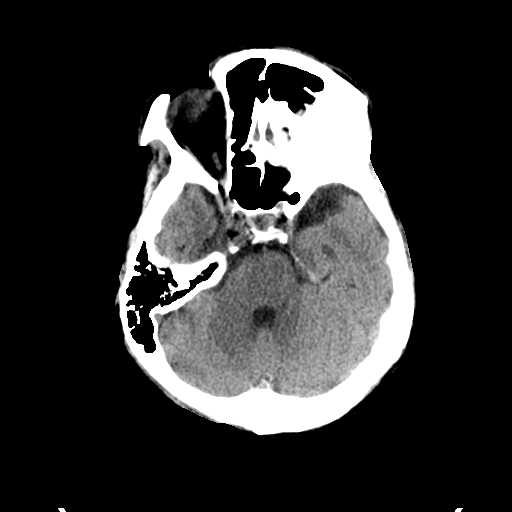
[im 12/31  brain]
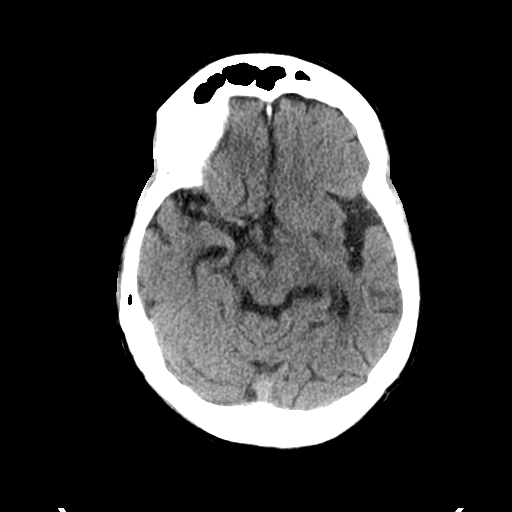
[im 16/31  brain]
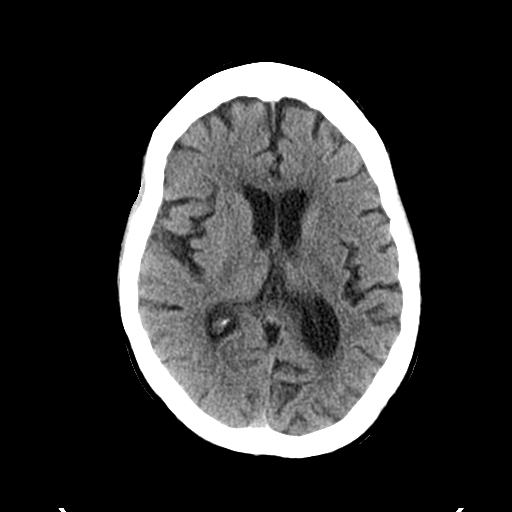
[im 16/31  bone]
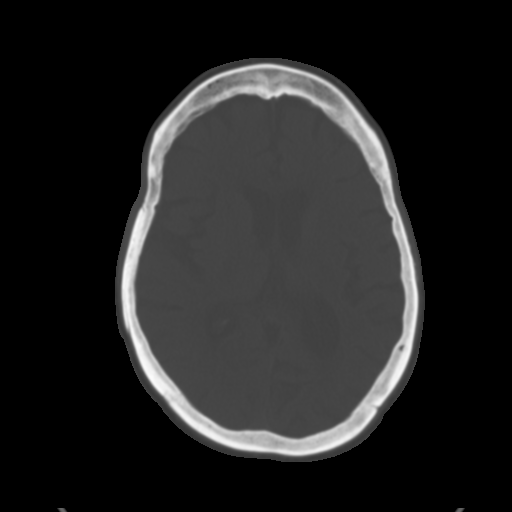
[im 19/31  brain]
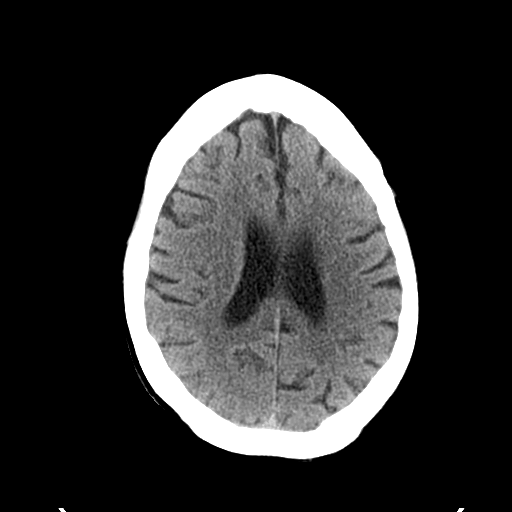
[im 22/31  brain]
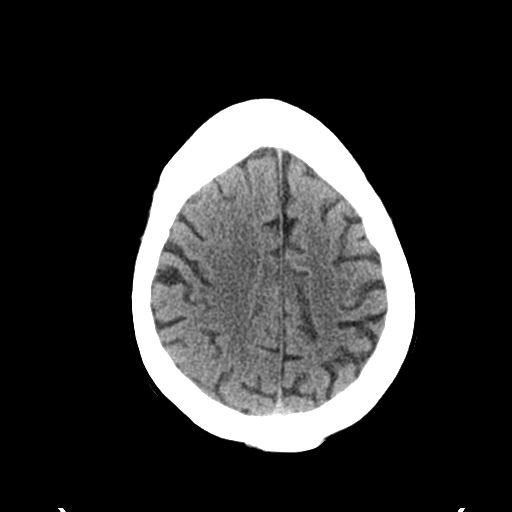
[im 25/31  brain]
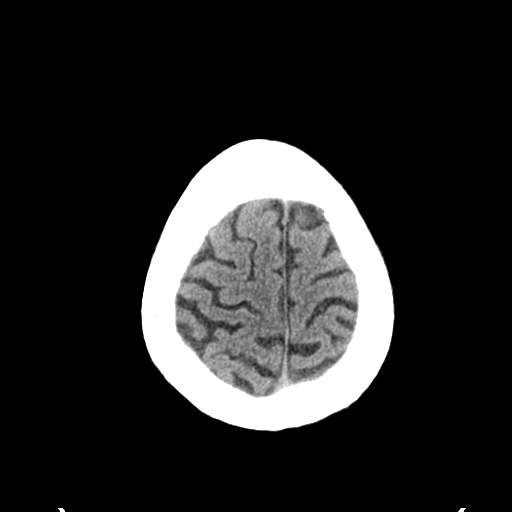
[im 28/31  brain]
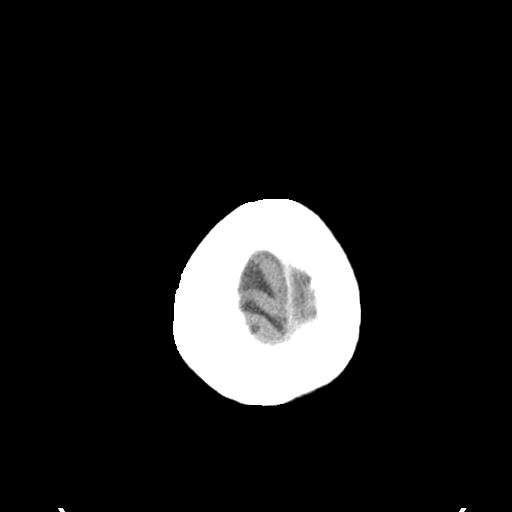
[im 28/31  bone]
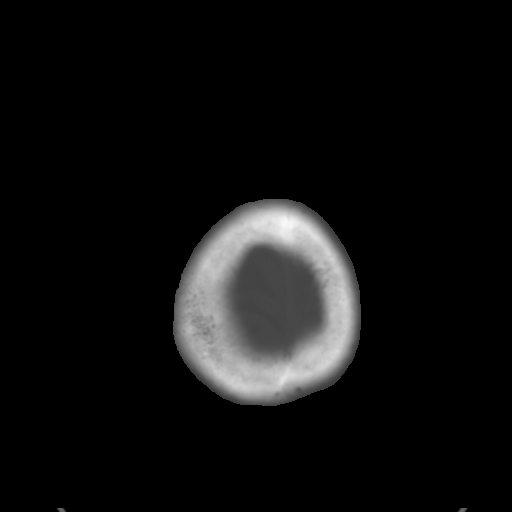

[Series 6: coronal soft tissue · coronal · 0.32mm/px · 3 of 64 slices shown]
[im 22/64  brain]
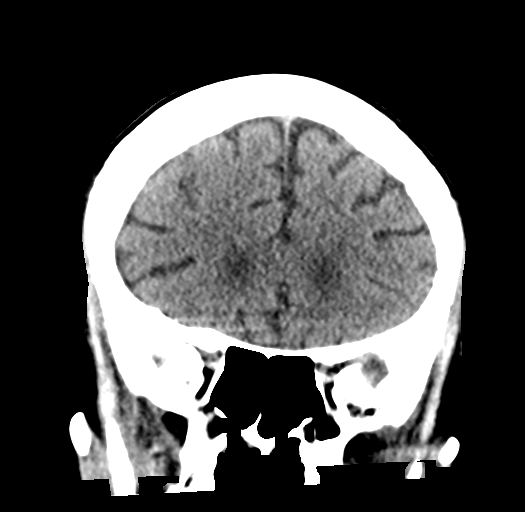
[im 29/64  brain]
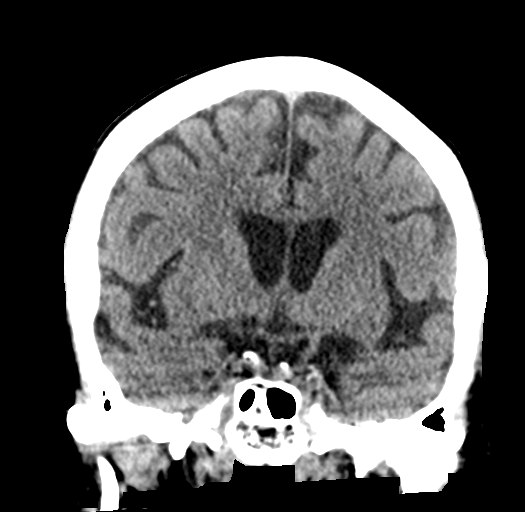
[im 36/64  brain]
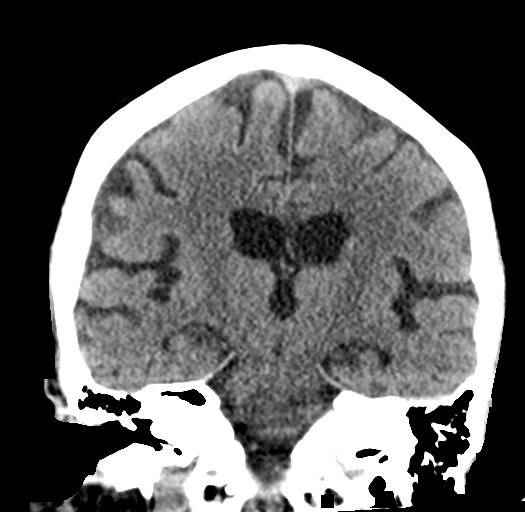

[Series 7: sagittal soft tissue · sagittal · 0.32mm/px · 3 of 50 slices shown]
[im 17/50  brain]
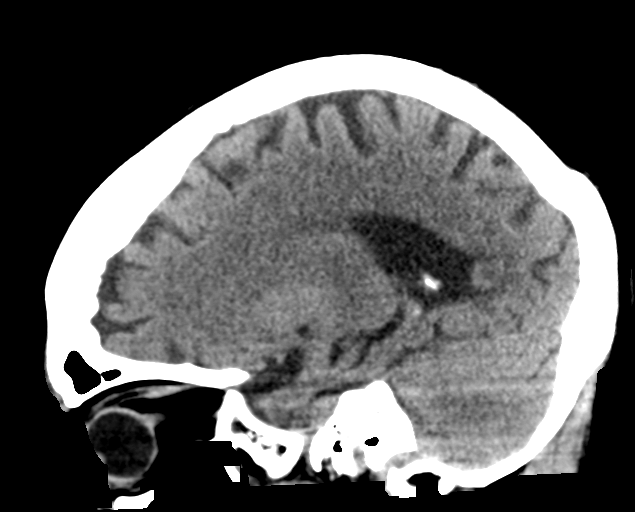
[im 25/50  brain]
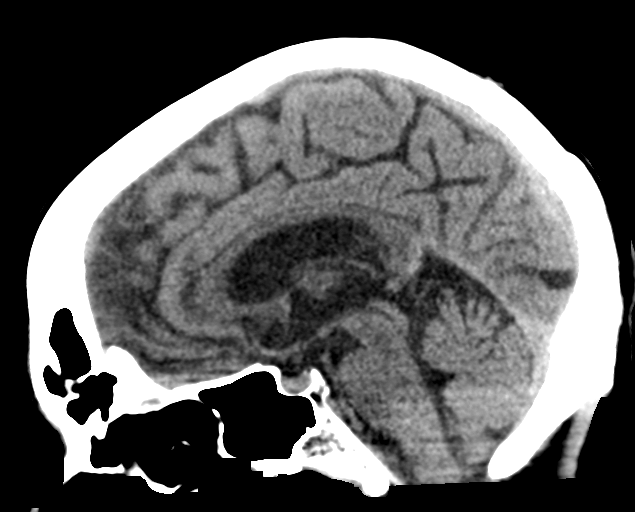
[im 33/50  brain]
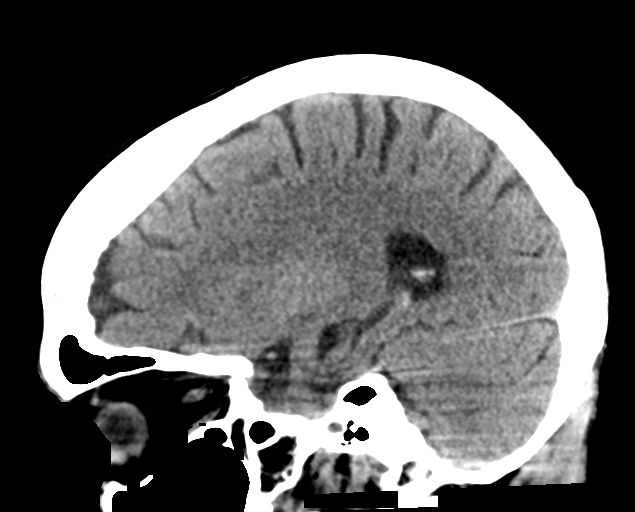

[15 of 47 positions shown; findings below may reference images not displayed]

FINDINGS: Brain: No evidence of acute territorial infarction, hemorrhage,
hydrocephalus,extra-axial collection or mass lesion/mass effect.
There is mild dilatation the ventricles and sulci consistent with
age-related atrophy. Low-attenuation changes in the deep white
matter consistent with small vessel ischemia.

Vascular: No hyperdense vessel or unexpected calcification.

Skull: The skull is intact. No fracture or focal lesion identified.

Sinuses/Orbits: Mild mucosal thickening seen within the left
maxillary sinus. The orbits and globes intact.

Other: None

Cervical spine:

Alignment: There is straightening of the normal cervical lordosis.

Skull base and vertebrae: Visualized skull base is intact. No
atlanto-occipital dissociation. The vertebral body heights are well
maintained. No fracture or pathologic osseous lesion seen.

Soft tissues and spinal canal: The visualized paraspinal soft
tissues are unremarkable. No prevertebral soft tissue swelling is
seen. The spinal canal is grossly unremarkable, no large epidural
collection or significant canal narrowing.

Disc levels: There is mild disc height loss with anterior
osteophytes and minimal disc osteophyte complex at C5-C6. There is
mild neural foraminal narrowing.6

Upper chest: The lung apices are clear. Thoracic inlet is within
normal limits.

Other: None
IMPRESSION: 1.  No acute intracranial abnormality.
2. Findings consistent with mild age related atrophy and chronic
small vessel ischemia
3. Mild left maxillary sinus mucosal thickening.
4. No acute fracture or malalignment of the spine.
